# Patient Record
Sex: Male | Born: 1935 | Race: White | Hispanic: No | Marital: Married | Smoking: Never smoker
Health system: Southern US, Community
[De-identification: ages and names within clinical notes are randomized; demographics above are authoritative.]

## PROBLEM LIST (undated history)

## (undated) DIAGNOSIS — I5032 Chronic diastolic (congestive) heart failure: Secondary | ICD-10-CM

## (undated) DIAGNOSIS — E785 Hyperlipidemia, unspecified: Secondary | ICD-10-CM

## (undated) DIAGNOSIS — I050 Rheumatic mitral stenosis: Secondary | ICD-10-CM

## (undated) DIAGNOSIS — I779 Disorder of arteries and arterioles, unspecified: Secondary | ICD-10-CM

## (undated) DIAGNOSIS — L97909 Non-pressure chronic ulcer of unspecified part of unspecified lower leg with unspecified severity: Secondary | ICD-10-CM

## (undated) DIAGNOSIS — J449 Chronic obstructive pulmonary disease, unspecified: Secondary | ICD-10-CM

## (undated) DIAGNOSIS — I878 Other specified disorders of veins: Secondary | ICD-10-CM

## (undated) DIAGNOSIS — G459 Transient cerebral ischemic attack, unspecified: Secondary | ICD-10-CM

## (undated) DIAGNOSIS — S7291XA Unspecified fracture of right femur, initial encounter for closed fracture: Secondary | ICD-10-CM

## (undated) DIAGNOSIS — I482 Chronic atrial fibrillation, unspecified: Secondary | ICD-10-CM

## (undated) DIAGNOSIS — I340 Nonrheumatic mitral (valve) insufficiency: Secondary | ICD-10-CM

## (undated) DIAGNOSIS — I83009 Varicose veins of unspecified lower extremity with ulcer of unspecified site: Secondary | ICD-10-CM

## (undated) DIAGNOSIS — I272 Pulmonary hypertension, unspecified: Secondary | ICD-10-CM

## (undated) DIAGNOSIS — I428 Other cardiomyopathies: Secondary | ICD-10-CM

## (undated) DIAGNOSIS — J9610 Chronic respiratory failure, unspecified whether with hypoxia or hypercapnia: Secondary | ICD-10-CM

## (undated) DIAGNOSIS — I10 Essential (primary) hypertension: Secondary | ICD-10-CM

## (undated) DIAGNOSIS — C61 Malignant neoplasm of prostate: Secondary | ICD-10-CM

## (undated) DIAGNOSIS — I639 Cerebral infarction, unspecified: Secondary | ICD-10-CM

## (undated) DIAGNOSIS — D649 Anemia, unspecified: Secondary | ICD-10-CM

## (undated) DIAGNOSIS — I4891 Unspecified atrial fibrillation: Secondary | ICD-10-CM

## (undated) HISTORY — DX: Cerebral infarction, unspecified: I63.9

## (undated) HISTORY — DX: Rheumatic mitral stenosis: I05.0

## (undated) HISTORY — DX: Essential (primary) hypertension: I10

## (undated) HISTORY — DX: Malignant neoplasm of prostate: C61

## (undated) HISTORY — PX: TONSILLECTOMY: SUR1361

## (undated) HISTORY — DX: Transient cerebral ischemic attack, unspecified: G45.9

## (undated) HISTORY — DX: Hyperlipidemia, unspecified: E78.5

## (undated) HISTORY — PX: APPENDECTOMY: SHX54

## (undated) HISTORY — DX: Unspecified atrial fibrillation: I48.91

---

## 2001-08-20 ENCOUNTER — Ambulatory Visit (HOSPITAL_COMMUNITY): Admission: RE | Admit: 2001-08-20 | Discharge: 2001-08-20 | Payer: Self-pay | Admitting: Orthopedic Surgery

## 2007-11-26 ENCOUNTER — Ambulatory Visit: Payer: Self-pay | Admitting: Cardiovascular Disease

## 2007-11-26 ENCOUNTER — Inpatient Hospital Stay (HOSPITAL_COMMUNITY): Admission: EM | Admit: 2007-11-26 | Discharge: 2007-12-01 | Payer: Self-pay | Admitting: Emergency Medicine

## 2007-11-27 ENCOUNTER — Encounter (INDEPENDENT_AMBULATORY_CARE_PROVIDER_SITE_OTHER): Payer: Self-pay | Admitting: Internal Medicine

## 2007-11-27 ENCOUNTER — Ambulatory Visit: Payer: Self-pay | Admitting: Vascular Surgery

## 2007-11-27 ENCOUNTER — Encounter (INDEPENDENT_AMBULATORY_CARE_PROVIDER_SITE_OTHER): Payer: Self-pay | Admitting: *Deleted

## 2007-12-05 ENCOUNTER — Ambulatory Visit: Payer: Self-pay | Admitting: Cardiovascular Disease

## 2007-12-12 ENCOUNTER — Ambulatory Visit: Payer: Self-pay | Admitting: Cardiology

## 2007-12-19 ENCOUNTER — Ambulatory Visit: Payer: Self-pay | Admitting: Internal Medicine

## 2007-12-25 ENCOUNTER — Ambulatory Visit: Payer: Self-pay | Admitting: Cardiovascular Disease

## 2007-12-30 ENCOUNTER — Ambulatory Visit: Payer: Self-pay | Admitting: Cardiology

## 2008-01-13 ENCOUNTER — Ambulatory Visit: Payer: Self-pay | Admitting: Internal Medicine

## 2008-02-03 ENCOUNTER — Ambulatory Visit: Payer: Self-pay | Admitting: Cardiology

## 2008-03-02 ENCOUNTER — Ambulatory Visit: Payer: Self-pay | Admitting: Cardiology

## 2008-03-30 ENCOUNTER — Ambulatory Visit: Payer: Self-pay | Admitting: Cardiology

## 2008-04-27 ENCOUNTER — Ambulatory Visit: Payer: Self-pay | Admitting: Cardiovascular Disease

## 2008-05-11 ENCOUNTER — Ambulatory Visit: Payer: Self-pay | Admitting: Internal Medicine

## 2008-06-01 ENCOUNTER — Ambulatory Visit: Payer: Self-pay | Admitting: Cardiovascular Disease

## 2008-06-22 ENCOUNTER — Ambulatory Visit: Payer: Self-pay | Admitting: Internal Medicine

## 2008-07-20 ENCOUNTER — Ambulatory Visit: Payer: Self-pay | Admitting: Cardiology

## 2008-08-16 ENCOUNTER — Ambulatory Visit: Payer: Self-pay | Admitting: Internal Medicine

## 2008-09-14 ENCOUNTER — Ambulatory Visit: Payer: Self-pay | Admitting: Internal Medicine

## 2008-10-05 ENCOUNTER — Ambulatory Visit: Payer: Self-pay | Admitting: Cardiovascular Disease

## 2008-10-08 DIAGNOSIS — E785 Hyperlipidemia, unspecified: Secondary | ICD-10-CM | POA: Insufficient documentation

## 2008-10-08 DIAGNOSIS — I6350 Cerebral infarction due to unspecified occlusion or stenosis of unspecified cerebral artery: Secondary | ICD-10-CM | POA: Insufficient documentation

## 2008-10-08 DIAGNOSIS — I4891 Unspecified atrial fibrillation: Secondary | ICD-10-CM

## 2008-10-08 DIAGNOSIS — G459 Transient cerebral ischemic attack, unspecified: Secondary | ICD-10-CM

## 2008-10-08 DIAGNOSIS — I10 Essential (primary) hypertension: Secondary | ICD-10-CM | POA: Insufficient documentation

## 2008-10-12 ENCOUNTER — Encounter: Payer: Self-pay | Admitting: Cardiovascular Disease

## 2008-10-12 ENCOUNTER — Ambulatory Visit: Payer: Self-pay | Admitting: Cardiovascular Disease

## 2008-10-12 DIAGNOSIS — R609 Edema, unspecified: Secondary | ICD-10-CM

## 2008-11-02 ENCOUNTER — Ambulatory Visit: Payer: Self-pay | Admitting: Internal Medicine

## 2008-11-30 ENCOUNTER — Ambulatory Visit: Payer: Self-pay | Admitting: Cardiology

## 2008-11-30 LAB — CONVERTED CEMR LAB
POC INR: 3.1
Protime: 21.3

## 2008-12-01 ENCOUNTER — Encounter: Payer: Self-pay | Admitting: *Deleted

## 2008-12-28 ENCOUNTER — Ambulatory Visit: Payer: Self-pay | Admitting: Internal Medicine

## 2008-12-28 LAB — CONVERTED CEMR LAB: Prothrombin Time: 19.6 s

## 2009-01-05 ENCOUNTER — Encounter: Payer: Self-pay | Admitting: *Deleted

## 2009-01-25 ENCOUNTER — Ambulatory Visit: Payer: Self-pay | Admitting: Internal Medicine

## 2009-02-15 ENCOUNTER — Ambulatory Visit: Payer: Self-pay | Admitting: Cardiology

## 2009-03-15 ENCOUNTER — Ambulatory Visit: Payer: Self-pay | Admitting: Cardiology

## 2009-03-15 LAB — CONVERTED CEMR LAB: POC INR: 3.3

## 2009-04-06 ENCOUNTER — Ambulatory Visit: Payer: Self-pay | Admitting: Cardiovascular Disease

## 2009-04-06 ENCOUNTER — Ambulatory Visit: Payer: Self-pay | Admitting: Internal Medicine

## 2009-04-26 ENCOUNTER — Ambulatory Visit: Payer: Self-pay | Admitting: Cardiology

## 2009-05-24 ENCOUNTER — Ambulatory Visit: Payer: Self-pay | Admitting: Cardiology

## 2009-05-24 LAB — CONVERTED CEMR LAB: POC INR: 2.4

## 2009-06-03 ENCOUNTER — Telehealth: Payer: Self-pay | Admitting: Cardiovascular Disease

## 2009-06-21 ENCOUNTER — Ambulatory Visit: Payer: Self-pay | Admitting: Internal Medicine

## 2009-06-21 LAB — CONVERTED CEMR LAB
INR: 2.7
POC INR: 2.7

## 2009-07-19 ENCOUNTER — Ambulatory Visit: Payer: Self-pay | Admitting: Cardiology

## 2009-08-16 ENCOUNTER — Ambulatory Visit: Payer: Self-pay | Admitting: Internal Medicine

## 2009-08-16 LAB — CONVERTED CEMR LAB: POC INR: 2.4

## 2009-09-13 ENCOUNTER — Ambulatory Visit: Payer: Self-pay | Admitting: Cardiology

## 2009-10-05 ENCOUNTER — Ambulatory Visit: Payer: Self-pay | Admitting: Cardiovascular Disease

## 2009-10-25 ENCOUNTER — Encounter (INDEPENDENT_AMBULATORY_CARE_PROVIDER_SITE_OTHER): Payer: Self-pay | Admitting: Pharmacist

## 2009-11-01 ENCOUNTER — Ambulatory Visit: Payer: Self-pay | Admitting: Cardiology

## 2009-11-29 ENCOUNTER — Ambulatory Visit: Payer: Self-pay | Admitting: Internal Medicine

## 2009-12-27 ENCOUNTER — Ambulatory Visit: Payer: Self-pay | Admitting: Internal Medicine

## 2009-12-27 LAB — CONVERTED CEMR LAB: POC INR: 2.8

## 2010-01-24 ENCOUNTER — Ambulatory Visit: Payer: Self-pay | Admitting: Cardiology

## 2010-01-24 LAB — CONVERTED CEMR LAB: POC INR: 2.9

## 2010-02-21 ENCOUNTER — Ambulatory Visit: Payer: Self-pay | Admitting: Cardiovascular Disease

## 2010-03-21 ENCOUNTER — Ambulatory Visit: Payer: Self-pay | Admitting: Cardiology

## 2010-03-21 LAB — CONVERTED CEMR LAB: POC INR: 3

## 2010-04-13 ENCOUNTER — Encounter: Payer: Self-pay | Admitting: Cardiovascular Disease

## 2010-04-18 ENCOUNTER — Ambulatory Visit: Payer: Self-pay | Admitting: Cardiology

## 2010-04-18 ENCOUNTER — Ambulatory Visit: Payer: Self-pay | Admitting: Cardiovascular Disease

## 2010-04-18 DIAGNOSIS — I509 Heart failure, unspecified: Secondary | ICD-10-CM

## 2010-04-18 LAB — CONVERTED CEMR LAB: POC INR: 2.1

## 2010-05-16 ENCOUNTER — Ambulatory Visit: Payer: Self-pay | Admitting: Cardiovascular Disease

## 2010-06-13 ENCOUNTER — Ambulatory Visit: Payer: Self-pay | Admitting: Internal Medicine

## 2010-07-11 ENCOUNTER — Ambulatory Visit: Admission: RE | Admit: 2010-07-11 | Discharge: 2010-07-11 | Payer: Self-pay | Source: Home / Self Care

## 2010-08-03 NOTE — Medication Information (Signed)
Summary: rov/eac  Anticoagulant Therapy  Managed by: Bethena Midget, RN, BSN Referring MD: Charlton Haws MD Supervising MD: Gala Romney MD, Reuel Boom Indication 1: Atrial Fibrillation (ICD-427.31) Lab Used: [facility] Site: [facility] INR POC 2.8 INR RANGE 2 - 3  Dietary changes: no    Health status changes: no    Bleeding/hemorrhagic complications: no    Recent/future hospitalizations: no    Any changes in medication regimen? no    Recent/future dental: no  Any missed doses?: no       Is patient compliant with meds? yes       Allergies: 1)  Penicillin V Potassium (Penicillin V Potassium)  Anticoagulation Management History:      The patient is taking warfarin and comes in today for a routine follow up visit.  Positive risk factors for bleeding include an age of 37 years or older and history of CVA/TIA.  The bleeding index is 'intermediate risk'.  Positive CHADS2 values include History of HTN, Age > 6 years old, and Prior Stroke/CVA/TIA.  The start date was 11/28/2007.  His last INR was 2.7.  Anticoagulation responsible provider: Bensimhon MD, Reuel Boom.  INR POC: 2.8.  Cuvette Lot#: [id number].  Exp: 03/2011.    Anticoagulation Management Assessment/Plan:      The patient's current anticoagulation dose is Coumadin 5 mg tabs: Take as directed by coumadin clinic..  The target INR is 2.0-3.0.  The next INR is due 01/24/2010.  Anticoagulation instructions were given to patient.  Results were reviewed/authorized by Bethena Midget, RN, BSN.  He was notified by Bethena Midget, RN, BSN.         Prior Anticoagulation Instructions: INR 2.6  Continue taking 1 tablet every day.  Return to clinic in 4 weeks.      Current Anticoagulation Instructions: INR 2.8 Continue 5mg s everyday.Recheck in 4 weeks.

## 2010-08-03 NOTE — Medication Information (Signed)
Summary: rov/eh  Anticoagulant Therapy  Managed by: Bethena Midget, RN, BSN Referring MD: Charlton Haws MD Supervising MD: Myrtis Ser MD, Tinnie Gens Indication 1: Atrial Fibrillation (ICD-427.31) Lab Used: [facility] Site: [facility] INR POC 2.4 INR RANGE 2 - 3  Dietary changes: no    Health status changes: no    Bleeding/hemorrhagic complications: no    Recent/future hospitalizations: no    Any changes in medication regimen? no    Recent/future dental: no  Any missed doses?: no       Is patient compliant with meds? yes       Allergies: 1)  Penicillin V Potassium (Penicillin V Potassium)  Anticoagulation Management History:      The patient is taking warfarin and comes in today for a routine follow up visit.  Positive risk factors for bleeding include an age of 75 years or older and history of CVA/TIA.  The bleeding index is 'intermediate risk'.  Positive CHADS2 values include History of HTN, Age > 28 years old, and Prior Stroke/CVA/TIA.  The start date was 11/28/2007.  His last INR was 2.7.  Anticoagulation responsible provider: Myrtis Ser MD, Tinnie Gens.  INR POC: 2.4.  Cuvette Lot#: [id number].  Exp: 10/2010.    Anticoagulation Management Assessment/Plan:      The patient's current anticoagulation dose is Coumadin 5 mg tabs: Take as directed by coumadin clinic..  The target INR is 2.0-3.0.  The next INR is due 09/13/2009.  Anticoagulation instructions were given to patient.  Results were reviewed/authorized by Bethena Midget, RN, BSN.  He was notified by Bethena Midget, RN, BSN.         Prior Anticoagulation Instructions: INR 2.7  Continue same dose of 1 tablet daily. Recheck in 4 weeks.  Current Anticoagulation Instructions: INR 2.4 Continue 5mg s daily. Recheck in 4 weeks.

## 2010-08-03 NOTE — Medication Information (Signed)
Summary: rovtm  Anticoagulant Therapy  Managed by: Bethena Midget, RN, BSN Referring MD: Charlton Haws MD Supervising MD: Shirlee Latch MD, Orie Baxendale Indication 1: Atrial Fibrillation (ICD-427.31) Lab Used: [facility] Site: [facility] INR POC 2.4 INR RANGE 2 - 3  Dietary changes: no    Health status changes: no    Bleeding/hemorrhagic complications: no    Recent/future hospitalizations: no    Any changes in medication regimen? no    Recent/future dental: no  Any missed doses?: no       Is patient compliant with meds? yes       Allergies: 1)  Penicillin V Potassium (Penicillin V Potassium)  Anticoagulation Management History:      The patient is taking warfarin and comes in today for a routine follow up visit.  Positive risk factors for bleeding include an age of 75 years or older and history of CVA/TIA.  The bleeding index is 'intermediate risk'.  Positive CHADS2 values include History of HTN, Age > 30 years old, and Prior Stroke/CVA/TIA.  The start date was 11/28/2007.  His last INR was 2.7.  Anticoagulation responsible provider: Shirlee Latch MD, Abubakr Wieman.  INR POC: 2.4.  Cuvette Lot#: [id number].  Exp: 10/2010.    Anticoagulation Management Assessment/Plan:      The patient's current anticoagulation dose is Coumadin 5 mg tabs: Take as directed by coumadin clinic..  The target INR is 2.0-3.0.  The next INR is due 10/11/2009.  Anticoagulation instructions were given to patient.  Results were reviewed/authorized by Bethena Midget, RN, BSN.  He was notified by Bethena Midget, RN, BSN.         Prior Anticoagulation Instructions: INR 2.4 Continue 5mg s daily. Recheck in 4 weeks.   Current Anticoagulation Instructions: Same as Prior Instructions.

## 2010-08-03 NOTE — Medication Information (Signed)
Summary: rov/mwb  Anticoagulant Therapy  Managed by: Weston Brass, PharmD Referring MD: Charlton Haws MD Supervising MD: Gala Romney MD, Reuel Boom Indication 1: Atrial Fibrillation (ICD-427.31) Lab Used: [facility] Site: [facility] INR POC 2.9 INR RANGE 2 - 3  Dietary changes: no    Health status changes: no    Bleeding/hemorrhagic complications: no    Recent/future hospitalizations: no    Any changes in medication regimen? no    Recent/future dental: no  Any missed doses?: yes     Details: Missed 1 dose 12/25  Is patient compliant with meds? yes       Allergies: 1)  Penicillin V Potassium (Penicillin V Potassium)  Anticoagulation Management History:      The patient is taking warfarin and comes in today for a routine follow up visit.  Positive risk factors for bleeding include an age of 75 years or older and history of CVA/TIA.  The bleeding index is 'intermediate risk'.  Positive CHADS2 values include History of CHF, History of HTN, Age > 75 years old, and Prior Stroke/CVA/TIA.  The start date was 11/28/2007.  His last INR was 2.7.  Anticoagulation responsible provider: Landy Dunnavant MD, Reuel Boom.  INR POC: 2.9.  Cuvette Lot#: [id number].  Exp: 08/2011.    Anticoagulation Management Assessment/Plan:      The patient's current anticoagulation dose is Coumadin 5 mg tabs: Take as directed by coumadin clinic..  The target INR is 2.0-3.0.  The next INR is due 08/08/2010.  Anticoagulation instructions were given to patient.  Results were reviewed/authorized by Weston Brass, PharmD.  He was notified by Stephannie Peters, PharmD Candidate .         Prior Anticoagulation Instructions: INR 2.3 The patient is to continue with the same dose of coumadin.  This dosage includes:  1 tablet (5mg ) every day Recheck INR in 4 weeks  Current Anticoagulation Instructions: INR 2.9  Coumadin 5 mg tablets - Take 1 tablet every day

## 2010-08-03 NOTE — Medication Information (Signed)
Summary: rov/ewj  Anticoagulant Therapy  Managed by: Bethena Midget, RN, BSN Referring MD: Charlton Haws MD Supervising MD: Clifton James MD, Cristal Deer Indication 1: Atrial Fibrillation (ICD-427.31) Lab Used: [facility] Site: [facility] INR POC 2.0 INR RANGE 2 - 3  Dietary changes: no    Health status changes: no    Bleeding/hemorrhagic complications: no    Recent/future hospitalizations: no    Any changes in medication regimen? no    Recent/future dental: no  Any missed doses?: yes     Details: missed one dose a week ago  Is patient compliant with meds? yes       Allergies: 1)  Penicillin V Potassium (Penicillin V Potassium)  Anticoagulation Management History:      The patient is taking warfarin and comes in today for a routine follow up visit.  Positive risk factors for bleeding include an age of 75 years or older and history of CVA/TIA.  The bleeding index is 'intermediate risk'.  Positive CHADS2 values include History of HTN, Age > 13 years old, and Prior Stroke/CVA/TIA.  The start date was 11/28/2007.  His last INR was 2.7.  Anticoagulation responsible Kira Hartl: Clifton James MD, Cristal Deer.  INR POC: 2.0.  Cuvette Lot#: [id number].  Exp: 04/2011.    Anticoagulation Management Assessment/Plan:      The patient's current anticoagulation dose is Coumadin 5 mg tabs: Take as directed by coumadin clinic..  The target INR is 2.0-3.0.  The next INR is due 03/21/2010.  Anticoagulation instructions were given to patient.  Results were reviewed/authorized by Bethena Midget, RN, BSN.  He was notified by Bethena Midget, RN, BSN.         Prior Anticoagulation Instructions: INR 2.9  Continue on same dosage 5mg  daily.  Recheck in 4 weeks.    Current Anticoagulation Instructions: INR 2.0 Continue 5mg s everyday. Recheck in 4 weeks.

## 2010-08-03 NOTE — Medication Information (Signed)
Summary: rov/tm  Anticoagulant Therapy  Managed by: Cloyde Reams, RN, BSN Referring MD: Charlton Haws MD Supervising MD: Daleen Squibb MD, Maisie Fus Indication 1: Atrial Fibrillation (ICD-427.31) Lab Used: [facility] Site: [facility] INR POC 3.0 INR RANGE 2 - 3  Dietary changes: no    Health status changes: no    Bleeding/hemorrhagic complications: no    Recent/future hospitalizations: no    Any changes in medication regimen? no    Recent/future dental: no  Any missed doses?: no       Is patient compliant with meds? yes       Allergies: 1)  Penicillin V Potassium (Penicillin V Potassium)  Anticoagulation Management History:      The patient is taking warfarin and comes in today for a routine follow up visit.  Positive risk factors for bleeding include an age of 75 years or older and history of CVA/TIA.  The bleeding index is 'intermediate risk'.  Positive CHADS2 values include History of HTN, Age > 16 years old, and Prior Stroke/CVA/TIA.  The start date was 11/28/2007.  His last INR was 2.7.  Anticoagulation responsible Raymund Manrique: Daleen Squibb MD, Maisie Fus.  INR POC: 3.0.  Cuvette Lot#: [id number].  Exp: 05/2011.    Anticoagulation Management Assessment/Plan:      The patient's current anticoagulation dose is Coumadin 5 mg tabs: Take as directed by coumadin clinic..  The target INR is 2.0-3.0.  The next INR is due 04/18/2010.  Anticoagulation instructions were given to patient.  Results were reviewed/authorized by Cloyde Reams, RN, BSN.  He was notified by Cloyde Reams RN.         Prior Anticoagulation Instructions: INR 2.0 Continue 5mg s everyday. Recheck in 4 weeks.   Current Anticoagulation Instructions: INR 3.0  Continue on same dosage 5mg  daily.  Recheck in 4 weeks.

## 2010-08-03 NOTE — Medication Information (Signed)
Summary: rov.mp  Anticoagulant Therapy  Managed by: Lew Dawes, PharmD Candidate Referring MD: Charlton Haws MD Supervising MD: Myrtis Ser MD, Tinnie Gens Indication 1: Atrial Fibrillation (ICD-427.31) Lab Used: [facility] Site: [facility] INR POC 2.7 INR RANGE 2 - 3  Dietary changes: no    Health status changes: no    Bleeding/hemorrhagic complications: no    Recent/future hospitalizations: no    Any changes in medication regimen? no    Recent/future dental: no  Any missed doses?: no       Is patient compliant with meds? yes       Allergies: 1)  Penicillin V Potassium (Penicillin V Potassium)  Anticoagulation Management History:      The patient is taking warfarin and comes in today for a routine follow up visit.  Positive risk factors for bleeding include an age of 75 years or older and history of CVA/TIA.  The bleeding index is 'intermediate risk'.  Positive CHADS2 values include History of HTN, Age > 75 years old, and Prior Stroke/CVA/TIA.  The start date was 11/28/2007.  His last INR was 2.7.  Anticoagulation responsible provider: Myrtis Ser MD, Tinnie Gens.  INR POC: 2.7.  Cuvette Lot#: [id number].  Exp: 10/2010.    Anticoagulation Management Assessment/Plan:      The patient's current anticoagulation dose is Coumadin 5 mg tabs: Take as directed by coumadin clinic..  The target INR is 2.0-3.0.  The next INR is due 08/16/2009.  Anticoagulation instructions were given to patient.  Results were reviewed/authorized by Lew Dawes, PharmD Candidate.  He was notified by Lew Dawes, PharmD Candidate.         Prior Anticoagulation Instructions: INR = 2.7  The patient is to continue with the same dose of coumadin.  This dosage includes: one tablet daily  Current Anticoagulation Instructions: INR 2.7  Continue same dose of 1 tablet daily. Recheck in 4 weeks.

## 2010-08-03 NOTE — Letter (Signed)
Summary: [facility] - [facility]inquent Coumadin 1  Coumadin  [address]   [place], [place] [address]   Phone: [phone]  Fax: [phone]     October 25, 2009 MRN: [id number]   [facility] [address] [place], [place]  [address]   Dear Aaron Frye,  This letter is being sent to you as a reminder that it is necessary for you to get your INR/PT checked regularly so that we can optimize your care.  Our records indicate that you were scheduled to have a test done recently.  As of today, we have not received the results of this test.  It is very important that you have your INR checked.  Please call our office at the number listed above to schedule an appointment at your earliest convenience.    If you have recently had your protime checked or have discontinued this medication, please contact our office at the above phone number to clarify this issue.  Thank you for this prompt attention to this important health care matter.  Sincerely,   [facility] Team

## 2010-08-03 NOTE — Medication Information (Signed)
Summary: rov/ewj  Anticoagulant Therapy  Managed by: Bethena Midget, RN, BSN Referring MD: Charlton Haws MD Supervising MD: Myrtis Ser MD, Tinnie Gens Indication 1: Atrial Fibrillation (ICD-427.31) Lab Used: [facility] Site: [facility] INR POC 2.1 INR RANGE 2 - 3  Dietary changes: no    Health status changes: no    Bleeding/hemorrhagic complications: no    Recent/future hospitalizations: no    Any changes in medication regimen? no    Recent/future dental: no  Any missed doses?: no       Is patient compliant with meds? yes      Comments: Seeing Dr Myrtis Ser today   Allergies: 1)  Penicillin V Potassium (Penicillin V Potassium)  Anticoagulation Management History:      The patient is taking warfarin and comes in today for a routine follow up visit.  Positive risk factors for bleeding include an age of 16 years or older and history of CVA/TIA.  The bleeding index is 'intermediate risk'.  Positive CHADS2 values include History of HTN, Age > 1 years old, and Prior Stroke/CVA/TIA.  The start date was 11/28/2007.  His last INR was 2.7.  Anticoagulation responsible provider: Myrtis Ser MD, Tinnie Gens.  INR POC: 2.1.  Cuvette Lot#: [id number].  Exp: 05/2011.    Anticoagulation Management Assessment/Plan:      The patient's current anticoagulation dose is Coumadin 5 mg tabs: Take as directed by coumadin clinic..  The target INR is 2.0-3.0.  The next INR is due 05/16/2010.  Anticoagulation instructions were given to patient.  Results were reviewed/authorized by Bethena Midget, RN, BSN.  He was notified by Bethena Midget, RN, BSN.         Prior Anticoagulation Instructions: INR 3.0  Continue on same dosage 5mg  daily.  Recheck in 4 weeks.    Current Anticoagulation Instructions: INR 2.1 Continue 5mg s daily. Recheck in 4 weeks.

## 2010-08-03 NOTE — Medication Information (Signed)
Summary: rov/tm  Anticoagulant Therapy  Managed by: Cloyde Reams, RN, BSN Referring MD: Charlton Haws MD Supervising MD: Juanda Chance MD, Riyan Haile Indication 1: Atrial Fibrillation (ICD-427.31) Lab Used: [facility] Site: [facility] INR POC 2.9 INR RANGE 2 - 3  Dietary changes: no    Health status changes: no    Bleeding/hemorrhagic complications: no    Recent/future hospitalizations: no    Any changes in medication regimen? no    Recent/future dental: no  Any missed doses?: no       Is patient compliant with meds? yes       Allergies: 1)  Penicillin V Potassium (Penicillin V Potassium)  Anticoagulation Management History:      The patient is taking warfarin and comes in today for a routine follow up visit.  Positive risk factors for bleeding include an age of 75 years or older and history of CVA/TIA.  The bleeding index is 'intermediate risk'.  Positive CHADS2 values include History of HTN, Age > 75 years old, and Prior Stroke/CVA/TIA.  The start date was 11/28/2007.  His last INR was 2.7.  Anticoagulation responsible provider: Juanda Chance MD, Smitty Cords.  INR POC: 2.9.  Cuvette Lot#: [id number].  Exp: 04/2011.    Anticoagulation Management Assessment/Plan:      The patient's current anticoagulation dose is Coumadin 5 mg tabs: Take as directed by coumadin clinic..  The target INR is 2.0-3.0.  The next INR is due 02/21/2010.  Anticoagulation instructions were given to patient.  Results were reviewed/authorized by Cloyde Reams, RN, BSN.  He was notified by Cloyde Reams RN.         Prior Anticoagulation Instructions: INR 2.8 Continue 5mg s everyday.Recheck in 4 weeks.   Current Anticoagulation Instructions: INR 2.9  Continue on same dosage 5mg  daily.  Recheck in 4 weeks.

## 2010-08-03 NOTE — Assessment & Plan Note (Signed)
Summary: 6 MO F/U /CY   History of Present Illness: Aaron Frye is seen today for f/U of HTN, chronic afib and coumadin therapy.  He is doing well and continues to work daily at the [employer].  His coumadin dose has been decreased a bit.  He has not had any bleeding porblems and sees the clinic monthly. His rate contol has been fine with no palpitations, SSCP or dyspnea.  His EF has been in the 35-40% range.  He has had no congestive symptoms.  His last echo suggested MS but I think it is more functional MAC  He has some large varicosities in the RLE but is not interested in injection Rx.  Current Problems (verified): 1)  Coumadin Therapy  (ICD-V58.61) 2)  Edema  (ICD-782.3) 3)  Tia  (ICD-435.9) 4)  Hyperlipidemia  (ICD-272.4) 5)  Hypertension  (ICD-401.9) 6)  Cva  (ICD-434.91) 7)  Atrial Fibrillation  (ICD-427.31)  Current Medications (verified): 1)  Altace 5 Mg Tabs (Ramipril) .... Take 1 Tablet By Mouth Once A Day 2)  Crestor 20 Mg Tabs (Rosuvastatin Calcium) .... Take 1 Tablet By Mouth Once A Day 3)  Hydrochlorothiazide 12.5 Mg Tabs (Hydrochlorothiazide) .... Take 1 Tablet By Mouth Every Morning 4)  Toprol Xl 100 Mg Xr24h-Tab (Metoprolol Succinate) .... Take 1/2 Tablet By Mouth Once A Day 5)  Coumadin 5 Mg Tabs (Warfarin Sodium) .... Take As Directed By Coumadin Clinic.  Allergies (verified): 1)  Penicillin V Potassium (Penicillin V Potassium)  Past History:  Past Medical History: Last updated: 10/08/2008 Current Problems:  TIA (ICD-435.9) HYPERLIPIDEMIA (ICD-272.4) HYPERTENSION (ICD-401.9) CVA (ICD-434.91) ATRIAL FIBRILLATION (ICD-427.31)  acute left frontoparietal infarct. prostate cancer   Family History: Last updated: 10/08/2008  Noncontributory.   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  Alcohol Use - no Drug Use - no  Review of Systems       Denies fever, malais, weight loss, blurry vision, decreased visual  acuity, cough, sputum, SOB, hemoptysis, pleuritic pain, palpitaitons, heartburn, abdominal pain, melena, lower extremity edema, claudication, or rash.   Vital Signs:  Patient profile:   77 year old male Height:      69 inches Weight:      191 pounds BMI:     28.31 Pulse rate:   70 / minute Pulse rhythm:   irregularly irregular Resp:     12 per minute BP sitting:   140 / 78  (left arm)  Vitals Entered By: Kem Parkinson (October 05, 2009 9:18 AM)  Physical Exam  General:  Affect appropriate Healthy:  appears stated age HEENT: normal Neck supple with no adenopathy JVP normal no bruits no thyromegaly Lungs clear with no wheezing and good diaphragmatic motion Heart:  S1/S2 no murmur,rub, gallop or click PMI normal Abdomen: benighn, BS positve, no tenderness, no AAA no bruit.  No HSM or HJR Distal pulses intact with no bruits Plus one edema with large varicosities in RLE Neuro non-focal Skin warm and dry    Impression & Recommendations:  Problem # 1:  COUMADIN THERAPY (ICD-V58.61) Dose adjusted.  INR RX with no bleeding problems  Problem # 2:  EDEMA (ICD-782.3) Support hose and elevate legs at end of day.  [facility]er to [facility] if needed  Problem # 3:  HYPERTENSION (ICD-401.9) Well controlled  His updated medication list for this problem includes:    Altace 5 Mg Tabs (Ramipril) .[name]... Take 1 tablet by mouth once a day  Hydrochlorothiazide 12.5 Mg Tabs (Hydrochlorothiazide) .[name]... Take 1 tablet by mouth every morning    Toprol Xl 100 Mg Xr24h-tab (Metoprolol succinate) .[name]... Take 1/2 tablet by mouth once a day  Problem # 4:  HYPERLIPIDEMIA (ICD-272.4) Liver and lipid in 6 months His updated medication list for this problem includes:    Crestor 20 Mg Tabs (Rosuvastatin calcium) .[name]... Take 1 tablet by mouth once a day  Problem # 5:  ATRIAL FIBRILLATION (ICD-427.31) Asymptomatic with good rate control His updated medication list for this problem includes:     Toprol Xl 100 Mg Xr24h-tab (Metoprolol succinate) .[name]... Take 1/2 tablet by mouth once a day    Coumadin 5 Mg Tabs (Warfarin sodium) .[name]... Take as directed by coumadin clinic.  Patient Instructions: 1)  Your physician recommends that you schedule a follow-up appointment in: 6 MONTHS

## 2010-08-03 NOTE — Medication Information (Signed)
Summary: rov/tm  Anticoagulant Therapy  Managed by: Lyna Poser, PharmD Referring MD: Charlton Haws MD Supervising MD: Excell Seltzer MD, Casimiro Needle Indication 1: Atrial Fibrillation (ICD-427.31) Lab Used: [facility] Site: [facility] INR POC 2 INR RANGE 2 - 3  Dietary changes: no    Health status changes: no    Bleeding/hemorrhagic complications: no    Recent/future hospitalizations: no    Any changes in medication regimen? no    Recent/future dental: no  Any missed doses?: no       Is patient compliant with meds? yes       Allergies: 1)  Penicillin V Potassium (Penicillin V Potassium)  Anticoagulation Management History:      The patient is taking warfarin and comes in today for a routine follow up visit.  Positive risk factors for bleeding include an age of 75 years or older and history of CVA/TIA.  The bleeding index is 'intermediate risk'.  Positive CHADS2 values include History of CHF, History of HTN, Age > 64 years old, and Prior Stroke/CVA/TIA.  The start date was 11/28/2007.  His last INR was 2.7.  Anticoagulation responsible provider: Excell Seltzer MD, Casimiro Needle.  INR POC: 2.  Cuvette Lot#: [id number].  Exp: 06/02/2011.    Anticoagulation Management Assessment/Plan:      The patient's current anticoagulation dose is Coumadin 5 mg tabs: Take as directed by coumadin clinic..  The target INR is 2.0-3.0.  The next INR is due 06/13/2010.  Anticoagulation instructions were given to patient.  Results were reviewed/authorized by Lyna Poser, PharmD.         Prior Anticoagulation Instructions: INR 2.1 Continue 5mg s daily. Recheck in 4 weeks.    Current Anticoagulation Instructions: INR 2 Continue taking 1 tablet everyday. Recheck in 4 weeks.

## 2010-08-03 NOTE — Assessment & Plan Note (Signed)
Summary: F6M/DM   History of Present Illness: Aaron Frye is seen today for f/U of HTN, chronic afib and coumadin therapy.  He is doing well and continues to work daily at the [employer].  His coumadin dose has been decreased a bit.  He has not had any bleeding porblems and sees the clinic monthly. His rate contol has been fine with no palpitations, SSCP or dyspnea.  His EF has been in the 35-40% range.  He has had no congestive symptoms.  His last echo suggested MS but I think it is more functional MAC  He has some large varicosities in the RLE but is not interested in injection Rx. He will have a F/U echo in 6 months.    Current Problems (verified): 1)  CHF  (ICD-428.0) 2)  Coumadin Therapy  (ICD-V58.61) 3)  Edema  (ICD-782.3) 4)  Tia  (ICD-435.9) 5)  Hyperlipidemia  (ICD-272.4) 6)  Hypertension  (ICD-401.9) 7)  Cva  (ICD-434.91) 8)  Atrial Fibrillation  (ICD-427.31)  Current Medications (verified): 1)  Altace 5 Mg Tabs (Ramipril) .... Take 1 Tablet By Mouth Once A Day 2)  Crestor 20 Mg Tabs (Rosuvastatin Calcium) .... Take 1 Tablet By Mouth Once A Day 3)  Hydrochlorothiazide 12.5 Mg Tabs (Hydrochlorothiazide) .... Take 1 Tablet By Mouth Every Morning 4)  Toprol Xl 100 Mg Xr24h-Tab (Metoprolol Succinate) .... Take 1/2 Tablet By Mouth Once A Day 5)  Coumadin 5 Mg Tabs (Warfarin Sodium) .... Take As Directed By Coumadin Clinic.  Allergies (verified): 1)  Penicillin V Potassium (Penicillin V Potassium)  Past History:  Past Medical History: Last updated: 10/08/2008 Current Problems:  TIA (ICD-435.9) HYPERLIPIDEMIA (ICD-272.4) HYPERTENSION (ICD-401.9) CVA (ICD-434.91) ATRIAL FIBRILLATION (ICD-427.31)  acute left frontoparietal infarct. prostate cancer   Family History: Last updated: 10/08/2008  Noncontributory.   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  Alcohol Use - no Drug Use - no  Review of Systems       Denies fever,  malais, weight loss, blurry vision, decreased visual acuity, cough, sputum, SOB, hemoptysis, pleuritic pain, palpitaitons, heartburn, abdominal pain, melena,  claudication, or rash. Chronic venous insuf with brawny edema  Vital Signs:  Patient profile:   77 year old male Height:      69 inches Weight:      187 pounds BMI:     27.71 Pulse rate:   78 / minute Pulse rhythm:   irregularly irregular Resp:     12 per minute BP sitting:   138 / 80  (left arm)  Vitals Entered By: Kem Parkinson (April 18, 2010 9:55 AM)  Physical Exam  General:  Affect appropriate Healthy:  appears stated age HEENT: normal Neck supple with no adenopathy JVP normal no bruits no thyromegaly Lungs clear with no wheezing and good diaphragmatic motion Heart:  S1/S2 no murmur,rub, gallop or click PMI normal Abdomen: benighn, BS positve, no tenderness, no AAA no bruit.  No HSM or HJR Distal pulses intact with no bruits Chronic venous insuf with brawny edema Neuro non-focal Skin warm and dry    Impression & Recommendations:  Problem # 1:  CHF (ICD-428.0) Stable F/U echo in 6 months His updated medication list for this problem includes:    Altace 5 Mg Tabs (Ramipril) .[name]... Take 1 tablet by mouth once a day    Hydrochlorothiazide 12.5 Mg Tabs (Hydrochlorothiazide) .[name]... Take 1 tablet by mouth every morning    Toprol Xl 100 Mg Xr24h-tab (Metoprolol succinate) .Marland KitchenMarland KitchenMarland KitchenMarland Kitchen  Take 1/2 tablet by mouth once a day    Coumadin 5 Mg Tabs (Warfarin sodium) .[name]... Take as directed by coumadin clinic.  Orders: Echocardiogram (Echo)  Problem # 2:  COUMADIN THERAPY (ICD-V58.61) INR Rx with no bleeding diathesis  Problem # 3:  EDEMA (ICD-782.3) Varicosities iwth venous insuf.  Continue support hose.  He has not wanted to see a vein specialist in past.  Continue HCTZ  Problem # 4:  HYPERTENSION (ICD-401.9) Well controlled His updated medication list for this problem includes:    Altace 5 Mg Tabs (Ramipril) .[name]...  Take 1 tablet by mouth once a day    Hydrochlorothiazide 12.5 Mg Tabs (Hydrochlorothiazide) .[name]... Take 1 tablet by mouth every morning    Toprol Xl 100 Mg Xr24h-tab (Metoprolol succinate) .[name]... Take 1/2 tablet by mouth once a day  Problem # 5:  HYPERLIPIDEMIA (ICD-272.4) Continue statins.  LFTs per primary in 6 months His updated medication list for this problem includes:    Crestor 20 Mg Tabs (Rosuvastatin calcium) .[name]... Take 1 tablet by mouth once a day  Problem # 6:  ATRIAL FIBRILLATION (ICD-427.31) Asymptomatic with good rate control and anticoagulaiton His updated medication list for this problem includes:    Toprol Xl 100 Mg Xr24h-tab (Metoprolol succinate) .[name]... Take 1/2 tablet by mouth once a day    Coumadin 5 Mg Tabs (Warfarin sodium) .[name]... Take as directed by coumadin clinic.  Orders: EKG w/ Interpretation (93000)  Patient Instructions: 1)  Your physician recommends that you schedule a follow-up appointment in: 6 MONTHS WITH DR Laurence Slate 2)  Your physician recommends that you continue on your current medications as directed. Please refer to the Current Medication list given to you today. 3)  Your physician has requested that you have an echocardiogram.  Echocardiography is a painless test that uses sound waves to create images of your heart. It provides your doctor with information about the size and shape of your heart and how well your heart's chambers and valves are working.  This procedure takes approximately one hour. There are no restrictions for this procedure.6 MONTHS   EKG Report  Procedure date:  04/18/2010  Findings:      AFib 78 Otherwise normal

## 2010-08-03 NOTE — Medication Information (Signed)
Summary: rov/mw  Anticoagulant Therapy  Managed by: Lyna Poser, PharmD Referring MD: Charlton Haws MD Supervising MD: Shirlee Latch MD, Dalton Indication 1: Atrial Fibrillation (ICD-427.31) Lab Used: [facility] Site: [facility] INR RANGE 2 - 3  Dietary changes: no    Health status changes: no    Bleeding/hemorrhagic complications: no    Recent/future hospitalizations: no    Any changes in medication regimen? no    Recent/future dental: no  Any missed doses?: no       Is patient compliant with meds? yes       Allergies: 1)  Penicillin V Potassium (Penicillin V Potassium)  Anticoagulation Management History:      Positive risk factors for bleeding include an age of 75 years or older and history of CVA/TIA.  The bleeding index is 'intermediate risk'.  Positive CHADS2 values include History of CHF, History of HTN, Age > 47 years old, and Prior Stroke/CVA/TIA.  The start date was 11/28/2007.  His last INR was 2.7.  Anticoagulation responsible provider: Shirlee Latch MD, Dalton.  Cuvette Lot#: [id number].  Exp: 07/2011.    Anticoagulation Management Assessment/Plan:      The patient's current anticoagulation dose is Coumadin 5 mg tabs: Take as directed by coumadin clinic..  The target INR is 2.0-3.0.  The next INR is due 07/11/2010.  Anticoagulation instructions were given to patient.  Results were reviewed/authorized by Lyna Poser, PharmD.         Prior Anticoagulation Instructions: INR 2 Continue taking 1 tablet everyday. Recheck in 4 weeks.  Current Anticoagulation Instructions: INR 2.3 The patient is to continue with the same dose of coumadin.  This dosage includes:  1 tablet (5mg ) every day Recheck INR in 4 weeks

## 2010-08-03 NOTE — Medication Information (Signed)
Summary: rov/eac  Anticoagulant Therapy  Managed by: Eda Keys, PharmD Referring MD: Charlton Haws MD Supervising MD: Ladona Ridgel MD, Sharlot Gowda Indication 1: Atrial Fibrillation (ICD-427.31) Lab Used: [facility] Site: [facility] INR POC 2.6 INR RANGE 2 - 3  Dietary changes: no    Health status changes: no    Bleeding/hemorrhagic complications: no    Recent/future hospitalizations: no    Any changes in medication regimen? no    Recent/future dental: no  Any missed doses?: no       Is patient compliant with meds? yes       Allergies: 1)  Penicillin V Potassium (Penicillin V Potassium)  Anticoagulation Management History:      The patient is taking warfarin and comes in today for a routine follow up visit.  Positive risk factors for bleeding include an age of 75 years or older and history of CVA/TIA.  The bleeding index is 'intermediate risk'.  Positive CHADS2 values include History of HTN, Age > 37 years old, and Prior Stroke/CVA/TIA.  The start date was 11/28/2007.  His last INR was 2.7.  Anticoagulation responsible provider: Ladona Ridgel MD, Sharlot Gowda.  INR POC: 2.6.  Cuvette Lot#: [id number].  Exp: 01/2011.    Anticoagulation Management Assessment/Plan:      The patient's current anticoagulation dose is Coumadin 5 mg tabs: Take as directed by coumadin clinic..  The target INR is 2.0-3.0.  The next INR is due 12/27/2009.  Anticoagulation instructions were given to patient.  Results were reviewed/authorized by Eda Keys, PharmD.  He was notified by Eda Keys.         Prior Anticoagulation Instructions: INR 2.1  Continue taking 1 tablet (5 mg) every day.  Return to clinic in 4 weeks.    Current Anticoagulation Instructions: INR 2.6  Continue taking 1 tablet every day.  Return to clinic in 4 weeks.

## 2010-08-03 NOTE — Medication Information (Signed)
Summary: Aaron Frye  Anticoagulant Therapy  Managed by: Eda Keys, PharmD Referring MD: Charlton Haws MD Supervising MD: Daleen Squibb MD, Maisie Fus Indication 1: Atrial Fibrillation (ICD-427.31) Lab Used: [facility] Site: [facility] INR POC 2.1 INR RANGE 2 - 3  Dietary changes: no    Health status changes: no    Bleeding/hemorrhagic complications: no    Recent/future hospitalizations: no    Any changes in medication regimen? no    Recent/future dental: no  Any missed doses?: yes     Details: missed two doses for a dental appt  Is patient compliant with meds? yes       Allergies: 1)  Penicillin V Potassium (Penicillin V Potassium)  Anticoagulation Management History:      The patient is taking warfarin and comes in today for a routine follow up visit.  Positive risk factors for bleeding include an age of 75 years or older and history of CVA/TIA.  The bleeding index is 'intermediate risk'.  Positive CHADS2 values include History of HTN, Age > 4 years old, and Prior Stroke/CVA/TIA.  The start date was 11/28/2007.  His last INR was 2.7.  Anticoagulation responsible provider: Daleen Squibb MD, Maisie Fus.  INR POC: 2.1.  Cuvette Lot#: [id number].  Exp: 12/2010.    Anticoagulation Management Assessment/Plan:      The patient's current anticoagulation dose is Coumadin 5 mg tabs: Take as directed by coumadin clinic..  The target INR is 2.0-3.0.  The next INR is due 11/29/2009.  Anticoagulation instructions were given to patient.  Results were reviewed/authorized by Eda Keys, PharmD.  He was notified by Eda Keys.         Prior Anticoagulation Instructions: INR 2.4 Continue 5mg s daily. Recheck in 4 weeks.   Current Anticoagulation Instructions: INR 2.1  Continue taking 1 tablet (5 mg) every day.  Return to clinic in 4 weeks.

## 2010-08-08 ENCOUNTER — Encounter: Payer: Self-pay | Admitting: Cardiovascular Disease

## 2010-08-08 ENCOUNTER — Encounter (INDEPENDENT_AMBULATORY_CARE_PROVIDER_SITE_OTHER): Payer: Medicare Other

## 2010-08-08 DIAGNOSIS — Z7901 Long term (current) use of anticoagulants: Secondary | ICD-10-CM

## 2010-08-08 DIAGNOSIS — I4891 Unspecified atrial fibrillation: Secondary | ICD-10-CM

## 2010-08-08 LAB — CONVERTED CEMR LAB: POC INR: 3.4

## 2010-08-17 NOTE — Medication Information (Signed)
Summary: Coumadin Clinic  Anticoagulant Therapy  Managed by: Weston Brass, PharmD Referring MD: Charlton Haws MD Supervising MD: Eden Emms MD, Theron Arista Indication 1: Atrial Fibrillation (ICD-427.31) Lab Used: [facility] Site: [facility] INR POC 3.4 INR RANGE 2 - 3  Dietary changes: no    Health status changes: no    Bleeding/hemorrhagic complications: no    Recent/future hospitalizations: no    Any changes in medication regimen? no    Recent/future dental: no  Any missed doses?: no       Is patient compliant with meds? yes       Allergies: 1)  Penicillin V Potassium (Penicillin V Potassium)  Anticoagulation Management History:      The patient is taking warfarin and comes in today for a routine follow up visit.  Positive risk factors for bleeding include an age of 33 years or older and history of CVA/TIA.  The bleeding index is 'intermediate risk'.  Positive CHADS2 values include History of CHF, History of HTN, Age > 66 years old, and Prior Stroke/CVA/TIA.  The start date was 11/28/2007.  His last INR was 2.7.  Anticoagulation responsible provider: Eden Emms MD, Theron Arista.  INR POC: 3.4.  Cuvette Lot#: [id number].  Exp: 08/2011.    Anticoagulation Management Assessment/Plan:      The patient's current anticoagulation dose is Coumadin 5 mg tabs: Take as directed by coumadin clinic..  The target INR is 2.0-3.0.  The next INR is due 08/22/2010.  Anticoagulation instructions were given to patient.  Results were reviewed/authorized by Weston Brass, PharmD.  He was notified by Margot Chimes PharmD Candidate.         Prior Anticoagulation Instructions: INR 2.9  Coumadin 5 mg tablets - Take 1 tablet every day   Current Anticoagulation Instructions: INR 3.4   Take 1/2 tablet of your Coumadin today and then resume your normal dose of 1 tablet daily.

## 2010-08-22 ENCOUNTER — Encounter: Payer: Self-pay | Admitting: Cardiovascular Disease

## 2010-08-22 ENCOUNTER — Encounter (INDEPENDENT_AMBULATORY_CARE_PROVIDER_SITE_OTHER): Payer: Medicare Other

## 2010-08-22 DIAGNOSIS — I4891 Unspecified atrial fibrillation: Secondary | ICD-10-CM

## 2010-08-22 DIAGNOSIS — Z7901 Long term (current) use of anticoagulants: Secondary | ICD-10-CM

## 2010-08-22 DIAGNOSIS — G459 Transient cerebral ischemic attack, unspecified: Secondary | ICD-10-CM

## 2010-08-22 DIAGNOSIS — I6350 Cerebral infarction due to unspecified occlusion or stenosis of unspecified cerebral artery: Secondary | ICD-10-CM

## 2010-08-29 NOTE — Medication Information (Signed)
Summary: rov/tm  Anticoagulant Therapy  Managed by: Weston Brass, PharmD Referring MD: Charlton Haws MD Supervising MD: Excell Seltzer MD, Casimiro Needle Indication 1: Atrial Fibrillation (ICD-427.31) Lab Used: [facility] Site: [facility] INR RANGE 2 - 3  Dietary changes: no    Health status changes: no    Bleeding/hemorrhagic complications: no    Recent/future hospitalizations: no    Any changes in medication regimen? no    Recent/future dental: no  Any missed doses?: no       Is patient compliant with meds? yes       Allergies: 1)  Penicillin V Potassium (Penicillin V Potassium)  Anticoagulation Management History:      The patient is taking warfarin and comes in today for a routine follow up visit.  Positive risk factors for bleeding include an age of 75 years or older and history of CVA/TIA.  The bleeding index is 'intermediate risk'.  Positive CHADS2 values include History of CHF, History of HTN, Age > 55 years old, and Prior Stroke/CVA/TIA.  The start date was 11/28/2007.  His last INR was 2.7.  Anticoagulation responsible provider: Excell Seltzer MD, Casimiro Needle.  Cuvette Lot#: [id number].  Exp: 07/2011.    Anticoagulation Management Assessment/Plan:      The patient's current anticoagulation dose is Coumadin 5 mg tabs: Take as directed by coumadin clinic..  The target INR is 2.0-3.0.  The next INR is due 09/19/2010.  Anticoagulation instructions were given to patient.  Results were reviewed/authorized by Weston Brass, PharmD.  He was notified by Margot Chimes PharmD Candidate.         Prior Anticoagulation Instructions: INR 3.4   Take 1/2 tablet of your Coumadin today and then resume your normal dose of 1 tablet daily.    Current Anticoagulation Instructions: INR 2.5   Continue to take 1 tablet (5mg ) everyday.  Recheck INR in 4 weeks.

## 2010-09-19 ENCOUNTER — Ambulatory Visit (INDEPENDENT_AMBULATORY_CARE_PROVIDER_SITE_OTHER): Payer: Medicare Other | Admitting: *Deleted

## 2010-09-19 DIAGNOSIS — G459 Transient cerebral ischemic attack, unspecified: Secondary | ICD-10-CM

## 2010-09-19 DIAGNOSIS — I4891 Unspecified atrial fibrillation: Secondary | ICD-10-CM

## 2010-09-19 DIAGNOSIS — I6350 Cerebral infarction due to unspecified occlusion or stenosis of unspecified cerebral artery: Secondary | ICD-10-CM

## 2010-09-19 NOTE — Patient Instructions (Signed)
Continue on same dosage 5mg daily.  Recheck in 4 weeks.   

## 2010-10-13 ENCOUNTER — Encounter: Payer: Self-pay | Admitting: Cardiovascular Disease

## 2010-10-17 ENCOUNTER — Encounter: Payer: Medicare Other | Admitting: *Deleted

## 2010-10-18 ENCOUNTER — Encounter: Payer: Self-pay | Admitting: Cardiovascular Disease

## 2010-10-18 ENCOUNTER — Ambulatory Visit (INDEPENDENT_AMBULATORY_CARE_PROVIDER_SITE_OTHER): Payer: Medicare Other | Admitting: Cardiovascular Disease

## 2010-10-18 ENCOUNTER — Ambulatory Visit (INDEPENDENT_AMBULATORY_CARE_PROVIDER_SITE_OTHER): Payer: Medicare Other | Admitting: *Deleted

## 2010-10-18 DIAGNOSIS — I4891 Unspecified atrial fibrillation: Secondary | ICD-10-CM

## 2010-10-18 DIAGNOSIS — E785 Hyperlipidemia, unspecified: Secondary | ICD-10-CM

## 2010-10-18 DIAGNOSIS — G459 Transient cerebral ischemic attack, unspecified: Secondary | ICD-10-CM

## 2010-10-18 DIAGNOSIS — I10 Essential (primary) hypertension: Secondary | ICD-10-CM

## 2010-10-18 DIAGNOSIS — I6350 Cerebral infarction due to unspecified occlusion or stenosis of unspecified cerebral artery: Secondary | ICD-10-CM

## 2010-10-18 DIAGNOSIS — R609 Edema, unspecified: Secondary | ICD-10-CM

## 2010-10-18 MED ORDER — PRAVASTATIN SODIUM 40 MG PO TABS: 40.0000 mg | ORAL_TABLET | Freq: Every evening | ORAL | Status: DC

## 2010-10-18 NOTE — Assessment & Plan Note (Signed)
Good rate control and anticoagulation.  Asymptomatic

## 2010-10-18 NOTE — Assessment & Plan Note (Signed)
Change to generic pravastatin and f/u labs in 6 months

## 2010-10-18 NOTE — Progress Notes (Signed)
Aaron Frye is seen today for f/U of HTN, chronic afib and coumadin therapy.  He is doing well and continues to work daily at the [employer].  His coumadin dose has been decreased a bit.  He has not had any bleeding porblems and sees the clinic monthly. His rate contol has been fine with no palpitations, SSCP or dyspnea.  His EF has been in the 35-40% range.  He has had no congestive symptoms.  His last echo suggested MS but I think it is more functional MAC  He has some large varicosities in the RLE but is not interested in injection Rx.  He is interested in a cheaper generic statin and I think we can change him to pravastatin  ROS: Denies fever, malais, weight loss, blurry vision, decreased visual acuity, cough, sputum, SOB, hemoptysis, pleuritic pain, palpitaitons, heartburn, abdominal pain, melena, lower extremity edema, claudication, or rash.   General: Affect appropriate Healthy:  appears stated age HEENT: normal Neck supple with no adenopathy JVP normal no bruits no thyromegaly Lungs clear with no wheezing and good diaphragmatic motion Heart:  S1/S2 no murmur,rub, gallop or click PMI normal Abdomen: benighn, BS positve, no tenderness, no AAA no bruit.  No HSM or HJR Distal pulses intact with no bruits No edema Neuro non-focal Skin warm and dry No muscular weakness   Current Outpatient Prescriptions  Medication Sig Dispense Refill  . aspirin 81 MG tablet Take 81 mg by mouth daily.        . hydrochlorothiazide (,MICROZIDE/HYDRODIURIL,) 12.5 MG capsule Take 12.5 mg by mouth daily.        . metoprolol (TOPROL-XL) 100 MG 24 hr tablet 50 mg. 1/2 tab po qd      . ramipril (ALTACE) 5 MG tablet Take 5 mg by mouth daily.        . rosuvastatin (CRESTOR) 20 MG tablet Take 20 mg by mouth daily.        [name] warfarin (COUMADIN) 5 MG tablet Take by mouth as directed.          Allergies  Penicillins  Electrocardiogram:  Assessment and Plan

## 2010-10-18 NOTE — Patient Instructions (Addendum)
Your physician recommends that you schedule a follow-up appointment in: 6 months with Dr. Eden Emms Your physician has recommended you make the following change in your medication: Stop Crestor. START Pravastatin 40mg  daily

## 2010-10-18 NOTE — Assessment & Plan Note (Signed)
Secondary to varicosities.  Continue diuretic and support hose

## 2010-10-18 NOTE — Assessment & Plan Note (Signed)
Well controlled.  Continue current medications and low sodium Dash type diet.    

## 2010-10-20 ENCOUNTER — Encounter: Payer: Medicare Other | Admitting: *Deleted

## 2010-10-20 ENCOUNTER — Ambulatory Visit: Payer: Medicare Other | Admitting: Cardiovascular Disease

## 2010-11-14 ENCOUNTER — Ambulatory Visit (INDEPENDENT_AMBULATORY_CARE_PROVIDER_SITE_OTHER): Payer: Medicare Other | Admitting: *Deleted

## 2010-11-14 DIAGNOSIS — G459 Transient cerebral ischemic attack, unspecified: Secondary | ICD-10-CM

## 2010-11-14 DIAGNOSIS — Z7901 Long term (current) use of anticoagulants: Secondary | ICD-10-CM | POA: Insufficient documentation

## 2010-11-14 DIAGNOSIS — I4891 Unspecified atrial fibrillation: Secondary | ICD-10-CM

## 2010-11-14 DIAGNOSIS — I6350 Cerebral infarction due to unspecified occlusion or stenosis of unspecified cerebral artery: Secondary | ICD-10-CM

## 2010-11-14 NOTE — H&P (Signed)
Aaron Frye, BOVENZI NO.:  [id number]   MEDICAL RECORD NO.:  [id number]          PATIENT TYPE:  EMS   LOCATION:  [facility]                         FACILITY:  [facility]   PHYSICIAN:  Madaline Savage, MD        DATE OF BIRTH:  [birth year]   DATE OF ADMISSION:  11/26/2007  DATE OF DISCHARGE:                              HISTORY & PHYSICAL   PRIMARY CARE PHYSICIAN:  [facility].   CHIEF COMPLAINT:  Weakness.   HISTORY OF PRESENT ILLNESS:  Mr. Oommen is an 77 year old gentleman  whose only past medical history is prostate cancer status post  prostatectomy who comes in with a weakness.  He apparently got out of  car today, and his keys fell down.  When he tried to pick it up, he felt  weak in both lower extremities and he sat down.  He was found sitting on  the floor as per the family and was unable to get up.  He denies any  loss of consciousness, any headaches, any chest pain and shortness of  breath or any confusion.  He now feels he is back to his baseline.  He  was brought to the emergency room and he was found to be in atrial  fibrillation.   PAST MEDICAL HISTORY:  History of prostate cancer status post  prostatectomy.   ALLERGIES:  PENICILLIN.   CURRENT MEDICATIONS:  None.   SOCIAL HISTORY:  Denies any history of smoking, alcohol or drug abuse.   FAMILY HISTORY:  Noncontributory.   REVIEW OF SYSTEMS:  GENERAL:  He denies any recent weight loss or weight  gain.  No fever or chills.  HEENT:  No headaches, no blurred vision.  No  sore throat.  CARDIOVASCULAR:.  Denies chest pain or palpitations.  RESPIRATORY:  No shortness of cough.  GI:  No abdominal pain, nausea,  vomiting, diarrhea, constipation.  EXTREMITIES:  No tingling numbness in  toes or fingers.  NEUROLOGICAL:  He is alert and oriented x3.   PHYSICAL EXAMINATION:  VITAL SIGNS:  Temperature 98.1, pulse rate of 66-  102 per minute, regular, blood pressure 157/103, respiratory 22, oxygen  97%  room air.  HEENT: Head atraumatic, normocephalic.  Pupils bilaterally equal and  reactive to light.  Mucous membranes moist.  NECK:  Supple.  No JVD, no carotid bruits.  CARDIOVASCULAR:  S1, S2 heard.  Irregular.  CHEST:  Clear to auscultation.  ABDOMEN:  Soft.  Bowel sounds heard.  EXTREMITIES:  No edema, cyanosis or clubbing.  CENTRAL NERVOUS SYSTEM:  No focal neurological deficits.   LABORATORY DATA:  White count of 13.1, hemoglobin 13.6, platelets 330.  Sodium 143, potassium 3.9, chloride 108, bicarb 28, BUN 14, creatinine  0.65, glucose 152.  Urinalysis is negative.  CT of the head shows no  acute intracranial findings, atrophy and minimal white matter disease.  EKG shows atrial fibrillation at a rate of 110 per minute.   IMPRESSION:  1. New-onset atrial fibrillation.  2. Lower extremity weakness.  3. Leukocytosis.   PLAN:  1. New-onset atrial fibrillation.  Mr. Scott is  an 77 year old      gentleman with no prior cardiac history, comes in with a new onset      atrial fibrillation.  He is rate-controlled at this time.  The      etiology of his atrial fibrillation is not clear.  I will get a 2-D      echocardiogram to look for any structural heart disease.  I will      get a set of cardiac markers on him.  We will also check a TSH on      him.  We can consider a cardiology consult for possible      cardioversion.  I will hold anticoagulation until his MRI is done.  2. Lower extremity weakness.  He did complain of lower extremity      weakness which is improved.  He now feels that he is back to his      baseline.  In the setting of his atrial fibrillation, I worry about      a TIA on this patient.  I will get a MRI and MRA of the brain on      him.  We will start him on aspirin at this time.  3. Leukocytosis.  This is most likely related to stress.  I will watch      him at this time.  4. I will put him on DVT prophylaxis.      Madaline Savage, MD  Electronically  Signed     PKN/MEDQ  D:  11/26/2007  T:  11/26/2007  Job:  [id number]

## 2010-11-14 NOTE — Assessment & Plan Note (Signed)
[facility]                            CARDIOLOGY OFFICE NOTE   NAME:Aaron Frye, Aaron Frye                     MRN:          [id number]  DATE:12/25/2007                            DOB:          04/22/1925    Aaron Frye is seen today in followup.  I initially saw him in the  hospital at the end of May after having an acute left frontoparietal  infarct.  He was found to be in AFib.  He was asymptomatic from this  other than his CVA.  He is not having palpitations, PND, or orthopnea.  There has been no shortness of breath or syncope.   As I recall, his echo showed normal LV function.   We thought that rate control and anticoagulation, all of that was in  order since his AFib was hemodynamically asymptomatic.  The patient is  doing well.  His INR has been in a fairly good range, being followed  closely in our Coumadin Clinic.   Apparently,  he sees a doctor at [facility].  His  Lopressor dose has been decreased.  I do not know why, although at 77  years old, he may have had excessive bradycardia.  He has not had any  presyncope.  We will try to call [facility] and see why  his metoprolol dose was adjusted.  Other than this, he has had  hyperlipidemia and was placed on a statin drug and had hypertension and  has been placed on a diuretic and ACE inhibitor.  He seems to be doing  well.  Since he left the hospital, he has been little bit more  lethargic.  His wife sends him home from work for nap from time to time.  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   Other than that he feels that his memory is a little bit worse since  having a CVA.  He has not had any bleeding diathesis.   REVIEW OF SYSTEMS:  Otherwise negative.   MEDICATIONS:  1. He is on Crestor 20 a day.  2. Hydrochlorothiazide 12.5 a day.  3. Metoprolol, his dose has been reduced.  I cannot quite tell whether      he is taking a half a tablet twice a  day or once a day.  4. Ramipril 5 a day.  5. Baby aspirin a day.   EXAMINATION:  GENERAL:  Remarkable for an elderly white male with  significant cervical spondylosis.  VITAL SIGNS:  Weight is 182, blood pressure is 124/71, pulse 82 and  regular, respiratory rate 14, and afebrile.  NECK:  Carotids are normal without bruit.  No lymphadenopathy,  thyromegaly, or JVP elevation.  LUNGS:  Clear.  Good diaphragmatic motion.  No wheezing.  S1 and S2.  Normal heart sounds.  PMI normal.  ABDOMEN:  Benign.  Bowel sounds positive.  No AAA, no tenderness, no  bruit, no hepatosplenomegaly, no hepatojugular reflux.  EXTREMITIES:  Distal pulses are intact.  No edema.  NEUROLOGIC:  Nonfocal.  SKIN:  Warm and dry.  No muscular weakness.   IMPRESSION:  1. Atrial fibrillation, currently asymptomatic.  Rate control with      beta-blocker.  I will call [facility] to see why      dose of Lopressor was decreased.  Continue to follow up in the      Coumadin Clinic, therapeutic INRs.  2. Hypertension, currently well-controlled.  Continue low dose      diuretic and ramipril.  3. Hyperlipidemia.  Continue Crestor.  Lipid and liver profile in 6      months.  4. Recent parietotemporal cerebrovascular accident with minimal      deficits outside of memory.  5. Follow up with Neurology as needed.  6. Overall, I think [name] is doing well and I will see him back in 3      months.     Noralyn Pick. Eden Emms, MD, [facility]  Electronically Signed    PCN/MedQ  DD: 12/25/2007  DT: 12/26/2007  Job #: [id number]

## 2010-11-14 NOTE — Assessment & Plan Note (Signed)
[facility]                            CARDIOLOGY OFFICE NOTE   NAME:Aaron Frye, Aaron Frye                     MRN:          [id number]  DATE:04/27/2008                            DOB:          1925/03/21    Aaron Frye returns today for followup.  He just had a followup in the  Coumadin Clinic.  His INR was little low at 1.8.  He has a history of  atrial fibrillation that we have chosen not to cardiovert him with rate  control and anticoagulation.  He has been asymptomatic.   He had a CVA in the hospital a few months back.   He is doing well.  He is recovered without residual sequelae.  He is  still working at his florist.  He actually has extended his hours now to  4 to 4:30 and his wife has to send him home sometimes.  He was asking  for a handicap placard today which at his age I think it is reasonable.  He particularly has a problem walking long distances to the [employer]  basketball game.   He has not had any significant palpitations, chest pain, PND, or  orthopnea.  He has not had any recurrent TIAs.   The patient has good LV function by echo done on Nov 27, 2007.  Actually, the patient has some decreased LV function with an EF being 35-  40% by echo in May 2009.   However, he has not had any significant CHF.   Talking to the patient, he is active.  He sleeps at night.  He is not  having any congestive symptoms.   He is allergic to PENICILLINS.   CURRENT MEDICATIONS:  1. Crestor 20 a day.  2. Hydrochlorothiazide 12.5 a day.  3. Warfarin as directed.  4. Ramipril 5 a day.  5. Aspirin a day.  6. Lopressor 12.5 mg b.i.d.   His exam is remarkable for blood pressure 130/70, pulse 82 and regular,  weight 188.  HEENT:  Unremarkable.  Carotids are without bruit.  No lymphadenopathy, thyromegaly, or JVP  elevation.  LUNGS: Clear, good diaphragmatic motion. No wheezing.  S1 and S2 with normal heart sounds.  PMI normal.  ABDOMEN:  Benign.  Bowel  sounds positive.  No AAA, no tenderness, no  bruit, no hepatosplenomegaly, no hepatojugular reflux.  Distal pulses intact. No edema.  NEURO:  Nonfocal.  SKIN:  Warm and dry.  No muscular weakness.   IMPRESSION:  1. Atrial fibrillation, chronic.  Continue beta-blocker and      anticoagulation.  Follow up Coumadin Clinic in 4 weeks.  2. Hypertension, currently well controlled.  Continue current dose of      medication including low-sodium diet.  3. Decreased left ventricular function, not having congestive      symptoms.  Continue current dose of ramipril and diuretic.  Follow      up left ventricular function in 6 months by echo.  4. Hyperlipidemia.  Continue Crestor.  Lipid and liver profile in 6      months.  5. A handicap placard was given to the  patient.     Noralyn Pick. Eden Emms, MD, [facility]  Electronically Signed    PCN/MedQ  DD: 04/27/2008  DT: 04/27/2008  Job #: [id number]

## 2010-11-14 NOTE — Consult Note (Signed)
NAME:  Aaron Frye, Aaron Frye              ACCOUNT NO.:  [id number]   MEDICAL RECORD NO.:  [id number]          PATIENT TYPE:  INP   LOCATION:  6527                         FACILITY:  [facility]   PHYSICIAN:  Noralyn Pick. Eden Emms, MD, FACCDATE OF BIRTH:  Jun 16, 1925   DATE OF CONSULTATION:  11/28/2007  DATE OF DISCHARGE:                                 CONSULTATION   This is an 77 year old patient admitted to the hospital with question of  a TIA and weakness found to be in AFib.   The patient was getting out of his car on May 27, he dropped his keys,  he fell weak in both lower extremities and sat down.  He was unable to  get up.  There was no loss of consciousness.  There was no headache or  other focal neurological signs.   In the emergency room, he was found to be in AFib.  In talking to the  patient, he has never noticed any palpitations.  He has not had previous  syncope.  There is no documented history of coronary artery disease.  The patient in general does not fall.  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   The patient subsequently has had a workup including a negative MRI and  CT.  There has been no permanent neurological sequelae.   He is currently on heparin and has had a dose of Coumadin.   The patient had a 2D echocardiogram that was read by Dr. Reyes Ivan.  I  disagree with the reading.  He indicated moderate decrease in LV  function.  The patient has small left ventricular cavity size.  His EF  is 50%-55%.  He also read his echo as moderate mitral stenosis, which I  disagree with.  The patient has severe MAC of the posterior leaflet,  however, by pressure half-time his mitral valve area was in excess of 3.  His gradient was only 9 mmHg which is frequently overestimated by  Doppler.  I think at the most he has mild mitral stenosis which is  functional due to severe mitral annular calcification.   However, the patient is currently back to his normal state.  He  is  asymptomatic.  He is not a candidate for cardioversion.  Given his age,  the best approach would be rate control and anticoagulation.   REVIEW OF SYSTEMS:  Otherwise remarkable for some right heel pain.  He  may have a bone spur, otherwise negative.   ALLERGIES:  He is allergic to PENICILLIN.   MEDICATIONS:  He was not taking medications prior to coming to the  hospital.  In the hospital, he is currently on 81 mg of aspirin,  warfarin as directed, Crestor 20 a day, and Lopressor 50 b.i.d.   SOCIAL HISTORY:  The patient is happily married.  I spoke to his wife on  the phone today to let her know what was going on with his atrial  fibrillation.  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  His  son works with him and I left a message with Cass today  as well.  He  does not smoke or drink.   FAMILY HISTORY:  Noncontributory.   PHYSICAL EXAMINATION:  GENERAL:  Remarkable for being afebrile.  VITAL SIGNS:  Heart rate is 80 and irregular, blood pressure is  currently 140/80, respirations are 16, afebrile, and room air  saturations are 98%.  HEENT:  Unremarkable.  NECK:  Carotids are without bruit.  No lymphadenopathy, thyromegaly, or  JVP elevation.  LUNGS:  Clear to diaphragmatic motion.  No wheezing.  CARDIAC:  S1 and S2.  There is no diastolic rumble.  There is a faint MR  murmur at the apex.  PMI is normal.  ABDOMEN:  Benign bowel sounds positive.  No AAA.  No hepatosplenomegaly.  No hepatojugular reflux.  No tenderness.  He is status post  prostatectomy.  EXTREMITIES:  Distal pulses are intact with no edema.  NEURO:  Nonfocal.  SKIN:  Warm and dry.  MUSCULOSKELETAL:  No muscular weakness.   LABORATORY DATA:  EKG shows atrial fibrillation with no other  significant changes.  The patient has not had a BMET.  Hemoglobin and  hematocrit are stable.  Enzymes are negative.  Chest x-ray shows no  active disease.   IMPRESSION:  1. Atrial fibrillation, unknown duration.   Continue with      coumadinization.  We will ask discharge planning to facilitate      possibility of home Lovenox, heparin, or Lovenox should not be      discontinued until his INR is above 2.2.  He will followup in our      Coumadin Clinic for rate control with b.i.d. Lopressor should      suffice.  2. Abnormal echo.  Again, I disagree with the reading by Dr. Reyes Ivan.      I think his left ventricular function is low normal and somewhat      difficult to interpret due to his atrial fibrillation.  I also      disagree with the fact that he has moderate mitral stenosis.  The      patient may benefit from followup MUGA scan or MRI to quantitate      his ejection fraction.  For the time being, he is asymptomatic and      does not need a diuretic or ACE inhibitor.  3. Previous prostatectomy, good urinary flow.  No need for Flomax or      other medications.  Will need PSA.  Followed by Dr. Christell Constant every      year at least.  4. Mitral valve disease.  The patient has mitral annular calcification      with mild mitral stenosis and mild mitral insufficiency.  Given the      patient's advanced age, he will not likely need any aggressive      workup for this, although I am sure it has something to do with him      developing atrial fibrillation, although his biggest risk factor      for atrial fibrillation is his age.   I went over in detail the risks of being on Coumadin with the patient.  His mother was on Coumadin and he is familiar with it.  He is a good  candidate for it.  He understands the need for monitoring and the risk  for bleeding.  I also went over issues regarding dietary restraints and  need to check Coumadin more frequently if antibiotics are started.   The patient will followup [facility] as an outpatient  including  our Coumadin Clinic.      Noralyn Pick. Eden Emms, MD, [facility]  Electronically Signed     PCN/MEDQ  D:  11/28/2007  T:  11/29/2007  Job:  [id number]

## 2010-11-14 NOTE — Discharge Summary (Signed)
NAMEGLADE, STRAUSSER NO.:  [id number]   MEDICAL RECORD NO.:  [id number]          PATIENT TYPE:  INP   LOCATION:  4703                         FACILITY:  [facility]   PHYSICIAN:  Altha Harm, MDDATE OF BIRTH:  [birth year]   DATE OF ADMISSION:  11/25/2007  DATE OF DISCHARGE:  12/01/2007                               DISCHARGE SUMMARY   DISCHARGE DISPOSITION:  Home.   FINAL DISCHARGE DIAGNOSES:  1. Acute left frontoparietal infarct.  2. Atrial fibrillation with rapid ventricular rate, heart rate now      controlled.  3. Hypertension.  4. Hyperlipidemia.  5. Systolic dysfunction with decreased ejection fraction.   DISCHARGE MEDICATIONS:  1. Coumadin 5 mg p.o. daily, initially to be titrated by Dr. Fabio Bering      office when INR is checked tomorrow.  2. Metoprolol XL 100 mg p.o. daily.  3. Crestor 20 mg p.o. daily.  4. Aspirin 81 mg p.o. daily.  5. Hydrochlorothiazide 12.5 mg p.o. daily.  6. Altace 5 mg p.o. daily.   CONSULTANTS:  Peter C. Eden Emms, MD,  [facility], Cardiology.   PROCEDURES:  None.   DIAGNOSTIC STUDIES:  1. CT head without contrast done on admission, which shows no acute      inferior abnormality.  2. Impression, atrophy and minimal white matter disease.  3. MRA of the head, without contrast, which the MRI shows an acute 2-3      cm region of infarction within the left frontoparietal region with      a small area of acute infarct in the insula, most consistent with      areas of embolic infarction.  No sign of hemorrhage.  4. MRA of the head, which shows a negative intracranial MR angiography      of the left medium-sized vessels.  5. A 2-D echocardiogram, official report showing EF ranging between      35% to 40%; however, please note, Dr. Eden Emms looked at the      echocardiogram in consultation and this agrees with the EF stating      that he believes the EF to be more within the range of 50%.  6. Carotid Doppler was done on Nov 27, 2007,  which shows bilateral      mild homogenous plaque in the bifurcation on the right, 40% to 60%      ICA stenosis; left, no ICA stenosis.  The right vertebral artery      was not visualized and left vertebral artery flow was antegrade.   ALLERGIES:  PENICILLIN.   CODE STATUS:  Full code.   CHIEF COMPLAINT:  Weakness.   HISTORY OF PRESENT ILLNESS:  Please see the H and P dictated by Dr. Darene Lamer  for details of the HPI.   HOSPITAL COURSE:  The patient was admitted to the hospital and MRI  revealed that the patient had a CVA.  The patient, however, had very  little initial deficit and at this time has no residual deficit as a  result.  The patient was evaluated by physical therapy, occupational,  speech therapy, and found to have no further  rehab needs.  The patient  was put on medications for risk factor modification including, Crestor  and aspirin.  It was felt that the patient's etiology of his stroke  embolic in nature, particularly, given the fact that the new onset of  atrial fibrillation with rapid ventricular rate.  The family had some  question about ablation or cardioversion, and requested a consult from  Dr. Eden Emms.  He was consulted at the request of the family and agreed  that this patient was not a candidate for cardioversion at this time.  However, he will follow the patient in the office for his atrial  fibrillation.  The patient was also found to have hypertension, new  onset and was treated with metoprolol and hydrochlorothiazide.  In light  of the fact that the patient does have a decreased ejection fraction, he  was started on Altace 5 mg p.o. daily in addition.  The patient has been  medically stable waiting for his Coumadin to be titrated to an  appropriate INR and the patient has had an INR within the therapeutic  range in last 48 hours, thus he is being discharged home on a dose of  Coumadin 5 mg.  The patient is to have his INR checked on Friday Dr.  Fabio Bering  office.   All this has been discussed with the patient, his wife, and his daughter  Ms. Evon Slack.  The patient's condition at this time is stable.   PHYSICAL EXAMINATION:  VITAL SIGNS:  As follows with temperature 97.7,  heart rate 69, respiratory rate 20, blood pressure 121/75, and O2 sats  are 98% on room air.   PERTINENT LABS:  His INR today is 2.4 and his PT is 26.5.   Otherwise, his other labs are stable.  His hemoglobin at the time of  discharge is 13.9 and hematocrit of 42.9 with platelet count within  normal range of 277.  The patient has been given diet instructions on  vitamin K deficient foods in relationship to his Coumadin use.  He has  also been given instructions on 2 g sodium diet.   FOLLOWUP:  The patient is to follow up with Dr. Eden Emms in his office on  Friday December 05, 2007, per PT/INR.  The patient was asked to follow up  with Dr. Raquel James, at [facility] within 1-week,  particularly with his new diagnosis of mild systolic dysfunction and  hypertension.      Altha Harm, MD  Electronically Signed     MAM/MEDQ  D:  12/01/2007  T:  12/02/2007  Job:  [id number]

## 2010-12-12 ENCOUNTER — Ambulatory Visit (INDEPENDENT_AMBULATORY_CARE_PROVIDER_SITE_OTHER): Payer: Medicare Other | Admitting: *Deleted

## 2010-12-12 DIAGNOSIS — I4891 Unspecified atrial fibrillation: Secondary | ICD-10-CM

## 2010-12-12 DIAGNOSIS — G459 Transient cerebral ischemic attack, unspecified: Secondary | ICD-10-CM

## 2010-12-12 DIAGNOSIS — I6350 Cerebral infarction due to unspecified occlusion or stenosis of unspecified cerebral artery: Secondary | ICD-10-CM

## 2010-12-12 LAB — POCT INR: INR: 3.2

## 2010-12-19 ENCOUNTER — Other Ambulatory Visit: Payer: Self-pay | Admitting: *Deleted

## 2010-12-19 MED ORDER — WARFARIN SODIUM 5 MG PO TABS: ORAL_TABLET | ORAL | Status: DC

## 2011-01-09 ENCOUNTER — Ambulatory Visit (INDEPENDENT_AMBULATORY_CARE_PROVIDER_SITE_OTHER): Payer: Medicare Other | Admitting: *Deleted

## 2011-01-09 DIAGNOSIS — I4891 Unspecified atrial fibrillation: Secondary | ICD-10-CM

## 2011-01-09 DIAGNOSIS — I6350 Cerebral infarction due to unspecified occlusion or stenosis of unspecified cerebral artery: Secondary | ICD-10-CM

## 2011-01-09 DIAGNOSIS — G459 Transient cerebral ischemic attack, unspecified: Secondary | ICD-10-CM

## 2011-01-09 LAB — POCT INR: INR: 2.9

## 2011-01-28 ENCOUNTER — Other Ambulatory Visit: Payer: Self-pay | Admitting: Cardiovascular Disease

## 2011-02-05 ENCOUNTER — Encounter (INDEPENDENT_AMBULATORY_CARE_PROVIDER_SITE_OTHER): Payer: Medicare Other | Admitting: Ophthalmology

## 2011-02-05 DIAGNOSIS — H353290 Exudative age-related macular degeneration, unspecified eye, stage unspecified: Secondary | ICD-10-CM

## 2011-02-05 DIAGNOSIS — H3530 Unspecified macular degeneration: Secondary | ICD-10-CM

## 2011-02-05 DIAGNOSIS — H43819 Vitreous degeneration, unspecified eye: Secondary | ICD-10-CM

## 2011-02-06 ENCOUNTER — Ambulatory Visit (INDEPENDENT_AMBULATORY_CARE_PROVIDER_SITE_OTHER): Payer: Medicare Other | Admitting: *Deleted

## 2011-02-06 DIAGNOSIS — G459 Transient cerebral ischemic attack, unspecified: Secondary | ICD-10-CM

## 2011-02-06 DIAGNOSIS — I6350 Cerebral infarction due to unspecified occlusion or stenosis of unspecified cerebral artery: Secondary | ICD-10-CM

## 2011-02-06 DIAGNOSIS — I4891 Unspecified atrial fibrillation: Secondary | ICD-10-CM

## 2011-02-06 LAB — POCT INR: INR: 2.7

## 2011-03-05 ENCOUNTER — Other Ambulatory Visit: Payer: Self-pay | Admitting: Cardiovascular Disease

## 2011-03-13 ENCOUNTER — Ambulatory Visit (INDEPENDENT_AMBULATORY_CARE_PROVIDER_SITE_OTHER): Payer: Medicare Other | Admitting: *Deleted

## 2011-03-13 DIAGNOSIS — I4891 Unspecified atrial fibrillation: Secondary | ICD-10-CM

## 2011-03-13 DIAGNOSIS — I6350 Cerebral infarction due to unspecified occlusion or stenosis of unspecified cerebral artery: Secondary | ICD-10-CM

## 2011-03-13 DIAGNOSIS — G459 Transient cerebral ischemic attack, unspecified: Secondary | ICD-10-CM

## 2011-03-26 ENCOUNTER — Encounter (INDEPENDENT_AMBULATORY_CARE_PROVIDER_SITE_OTHER): Payer: Medicare Other | Admitting: Ophthalmology

## 2011-03-26 DIAGNOSIS — H3530 Unspecified macular degeneration: Secondary | ICD-10-CM

## 2011-03-26 DIAGNOSIS — H43819 Vitreous degeneration, unspecified eye: Secondary | ICD-10-CM

## 2011-03-26 DIAGNOSIS — H353290 Exudative age-related macular degeneration, unspecified eye, stage unspecified: Secondary | ICD-10-CM

## 2011-03-28 LAB — CBC
HCT: 40.7
HCT: 41.5
Hemoglobin: 12.9 — ABNORMAL LOW
Hemoglobin: 13.2
Hemoglobin: 13.4
Hemoglobin: 13.8
MCHC: 31.8
MCHC: 32.3
MCHC: 33.7
MCV: 73.9 — ABNORMAL LOW
MCV: 73.9 — ABNORMAL LOW
MCV: 74 — ABNORMAL LOW
MCV: 74.2 — ABNORMAL LOW
Platelets: 322
RBC: 5.45
RBC: 5.51
RBC: 5.51
RBC: 5.62
RDW: 17.4 — ABNORMAL HIGH
RDW: 17.5 — ABNORMAL HIGH
WBC: 10.7 — ABNORMAL HIGH
WBC: 11.3 — ABNORMAL HIGH

## 2011-03-28 LAB — CARDIAC PANEL(CRET KIN+CKTOT+MB+TROPI)
Relative Index: INVALID
Relative Index: INVALID
Total CK: 27
Troponin I: 0.03

## 2011-03-28 LAB — RPR: RPR Ser Ql: NONREACTIVE

## 2011-03-28 LAB — DIFFERENTIAL
Basophils Absolute: 0
Basophils Relative: 0
Eosinophils Absolute: 0.3
Eosinophils Relative: 2
Lymphocytes Relative: 11 — ABNORMAL LOW
Lymphs Abs: 1.4
Monocytes Absolute: 0.7
Monocytes Relative: 5
Neutro Abs: 10.7 — ABNORMAL HIGH
Neutrophils Relative %: 82 — ABNORMAL HIGH

## 2011-03-28 LAB — COMPREHENSIVE METABOLIC PANEL
ALT: 17
AST: 23
Albumin: 3.3 — ABNORMAL LOW
Alkaline Phosphatase: 92
BUN: 14
CO2: 28
Calcium: 9
Chloride: 108
Creatinine, Ser: 0.65
GFR calc Af Amer: >60
GFR calc non Af Amer: >60
Glucose, Bld: 152 — ABNORMAL HIGH
Potassium: 3.9
Sodium: 143
Total Bilirubin: 0.6
Total Protein: 6.7

## 2011-03-28 LAB — BASIC METABOLIC PANEL
Chloride: 101
GFR calc Af Amer: >60
Potassium: 4.5

## 2011-03-28 LAB — URINALYSIS, ROUTINE W REFLEX MICROSCOPIC
Protein, ur: NEGATIVE
Urobilinogen, UA: 1

## 2011-03-28 LAB — LIPID PANEL
Cholesterol: 152
HDL: 26 — ABNORMAL LOW
LDL Cholesterol: 104 — ABNORMAL HIGH
Total CHOL/HDL Ratio: 5.8
Triglycerides: 111

## 2011-03-28 LAB — TSH: TSH: 1.571

## 2011-03-28 LAB — CK TOTAL AND CKMB (NOT AT ARMC): Total CK: 27

## 2011-03-28 LAB — TROPONIN I: Troponin I: 0.02

## 2011-03-28 LAB — PROTIME-INR
Prothrombin Time: 14
Prothrombin Time: 14.5

## 2011-03-28 LAB — HEPARIN LEVEL (UNFRACTIONATED): Heparin Unfractionated: 0.37

## 2011-03-28 LAB — URINE MICROSCOPIC-ADD ON

## 2011-03-29 LAB — CBC
HCT: 42.9
MCV: 74.6 — ABNORMAL LOW
RBC: 5.76
WBC: 11.4 — ABNORMAL HIGH

## 2011-03-29 LAB — HEPARIN LEVEL (UNFRACTIONATED): Heparin Unfractionated: 0.36

## 2011-03-29 LAB — PROTIME-INR
INR: 2.4 — ABNORMAL HIGH
Prothrombin Time: 26.5 — ABNORMAL HIGH

## 2011-04-10 ENCOUNTER — Ambulatory Visit (INDEPENDENT_AMBULATORY_CARE_PROVIDER_SITE_OTHER): Payer: Medicare Other | Admitting: *Deleted

## 2011-04-10 DIAGNOSIS — G459 Transient cerebral ischemic attack, unspecified: Secondary | ICD-10-CM

## 2011-04-10 DIAGNOSIS — I4891 Unspecified atrial fibrillation: Secondary | ICD-10-CM

## 2011-04-10 DIAGNOSIS — I6350 Cerebral infarction due to unspecified occlusion or stenosis of unspecified cerebral artery: Secondary | ICD-10-CM

## 2011-05-01 ENCOUNTER — Ambulatory Visit (INDEPENDENT_AMBULATORY_CARE_PROVIDER_SITE_OTHER): Payer: Medicare Other | Admitting: Cardiovascular Disease

## 2011-05-01 ENCOUNTER — Encounter: Payer: Self-pay | Admitting: Cardiovascular Disease

## 2011-05-01 VITALS — BP 143/83 | HR 82 | Ht 70.0 in | Wt 188.0 lb

## 2011-05-01 DIAGNOSIS — I6350 Cerebral infarction due to unspecified occlusion or stenosis of unspecified cerebral artery: Secondary | ICD-10-CM

## 2011-05-01 DIAGNOSIS — I10 Essential (primary) hypertension: Secondary | ICD-10-CM

## 2011-05-01 DIAGNOSIS — E785 Hyperlipidemia, unspecified: Secondary | ICD-10-CM

## 2011-05-01 DIAGNOSIS — I4891 Unspecified atrial fibrillation: Secondary | ICD-10-CM

## 2011-05-01 NOTE — Assessment & Plan Note (Signed)
2009  In setting of afib. Continue coumadin.  INR;s Rx

## 2011-05-01 NOTE — Progress Notes (Signed)
Aaron Frye is seen today for f/U of HTN, chronic afib and coumadin therapy. He is doing well and continues to work daily at the [employer]. His coumadin dose has been decreased a bit. He has not had any bleeding porblems and sees the clinic monthly. His rate contol has been fine with no palpitations, SSCP or dyspnea. His EF has been in the 35-40% range. He has had no congestive symptoms. 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 He is interested in a cheaper generic statin and I think we can change him to pravastatin  ROS: Denies fever, malais, weight loss, blurry vision, decreased visual acuity, cough, sputum, SOB, hemoptysis, pleuritic pain, palpitaitons, heartburn, abdominal pain, melena, lower extremity edema, claudication, or rash.  All other systems reviewed and negative  General: Affect appropriate Healthy:  appears stated age HEENT: normal Neck supple with no adenopathy JVP normal no bruits no thyromegaly Lungs clear with no wheezing and good diaphragmatic motion Heart:  S1/S2 no murmur,rub, gallop or click PMI normal Abdomen: benighn, BS positve, no tenderness, no AAA no bruit.  No HSM or HJR Distal pulses intact with no bruits Plus one RLE edema  With varicosities Neuro non-focal Skin warm and dry No muscular weakness   Current Outpatient Prescriptions  Medication Sig Dispense Refill  . aspirin 81 MG tablet Take 81 mg by mouth daily.        . CRESTOR 20 MG tablet Take 1 tablet by mouth Daily.      . hydrochlorothiazide (,MICROZIDE/HYDRODIURIL,) 12.5 MG capsule TAKE ONE CAPSULE BY MOUTH EVERY MORNING  30 capsule  12  . metoprolol (TOPROL-XL) 100 MG 24 hr tablet TAKE ONE-HALF TABLET BY MOUTH EVERY DAY  15 tablet  6  . ramipril (ALTACE) 5 MG tablet Take 5 mg by mouth daily.        [name] warfarin (COUMADIN) 5 MG tablet Take as directed by Anticoagulation clinic   90 tablet  1     Allergies  Penicillins  Electrocardiogram:  Afib rate 82 nonspecific ST/T wave changes  Assessment and Plan

## 2011-05-01 NOTE — Patient Instructions (Signed)
Your physician wants you to follow-up in: 6 MONTHS You will receive a reminder letter in the mail two months in advance. If you don't receive a letter, please call our office to schedule the follow-up appointment. 

## 2011-05-01 NOTE — Assessment & Plan Note (Signed)
Cholesterol is at goal.  Continue current dose of statin and diet Rx.  No myalgias or side effects.  F/U  LFT's in 6 months. Lab Results  Component Value Date   LDLCALC  Value: 104        Total Cholesterol/HDL:CHD Risk Coronary Heart Disease Risk Table                     Men   Women  1/2 Average Risk   3.4   3.3* 11/26/2007             

## 2011-05-01 NOTE — Assessment & Plan Note (Signed)
Well controlled.  Continue current medications and low sodium Dash type diet.    

## 2011-05-01 NOTE — Assessment & Plan Note (Signed)
Good anticoagulation and rate control

## 2011-05-08 ENCOUNTER — Ambulatory Visit (INDEPENDENT_AMBULATORY_CARE_PROVIDER_SITE_OTHER): Payer: Medicare Other | Admitting: *Deleted

## 2011-05-08 DIAGNOSIS — Z7901 Long term (current) use of anticoagulants: Secondary | ICD-10-CM

## 2011-05-08 DIAGNOSIS — I6350 Cerebral infarction due to unspecified occlusion or stenosis of unspecified cerebral artery: Secondary | ICD-10-CM

## 2011-05-08 DIAGNOSIS — G459 Transient cerebral ischemic attack, unspecified: Secondary | ICD-10-CM

## 2011-05-08 DIAGNOSIS — I4891 Unspecified atrial fibrillation: Secondary | ICD-10-CM

## 2011-05-14 ENCOUNTER — Encounter (INDEPENDENT_AMBULATORY_CARE_PROVIDER_SITE_OTHER): Payer: Medicare Other | Admitting: Ophthalmology

## 2011-06-05 ENCOUNTER — Telehealth: Payer: Self-pay | Admitting: Cardiovascular Disease

## 2011-06-05 ENCOUNTER — Ambulatory Visit (INDEPENDENT_AMBULATORY_CARE_PROVIDER_SITE_OTHER): Payer: Medicare Other | Admitting: *Deleted

## 2011-06-05 DIAGNOSIS — I6350 Cerebral infarction due to unspecified occlusion or stenosis of unspecified cerebral artery: Secondary | ICD-10-CM

## 2011-06-05 DIAGNOSIS — I4891 Unspecified atrial fibrillation: Secondary | ICD-10-CM

## 2011-06-05 DIAGNOSIS — Z7901 Long term (current) use of anticoagulants: Secondary | ICD-10-CM

## 2011-06-05 DIAGNOSIS — G459 Transient cerebral ischemic attack, unspecified: Secondary | ICD-10-CM

## 2011-06-05 LAB — POCT INR: INR: 2.6

## 2011-06-05 NOTE — Telephone Encounter (Signed)
New message:  Pt given samples of Crestor 10mg  but he had been taking 20 mg.  Wants to verify dosage.  Please call him back.

## 2011-06-05 NOTE — Telephone Encounter (Signed)
Verified pts dose of Crestor to be 20mg  daily.  Informed pt to take two of the10mg  tabs to equal his 20mg  daily dose.

## 2011-07-02 ENCOUNTER — Encounter (INDEPENDENT_AMBULATORY_CARE_PROVIDER_SITE_OTHER): Payer: Medicare Other | Admitting: Ophthalmology

## 2011-07-02 DIAGNOSIS — H43819 Vitreous degeneration, unspecified eye: Secondary | ICD-10-CM

## 2011-07-02 DIAGNOSIS — H3530 Unspecified macular degeneration: Secondary | ICD-10-CM

## 2011-07-02 DIAGNOSIS — H353290 Exudative age-related macular degeneration, unspecified eye, stage unspecified: Secondary | ICD-10-CM

## 2011-07-06 ENCOUNTER — Ambulatory Visit (INDEPENDENT_AMBULATORY_CARE_PROVIDER_SITE_OTHER): Payer: Medicare Other | Admitting: *Deleted

## 2011-07-06 DIAGNOSIS — I6350 Cerebral infarction due to unspecified occlusion or stenosis of unspecified cerebral artery: Secondary | ICD-10-CM

## 2011-07-06 DIAGNOSIS — Z7901 Long term (current) use of anticoagulants: Secondary | ICD-10-CM

## 2011-07-06 DIAGNOSIS — G459 Transient cerebral ischemic attack, unspecified: Secondary | ICD-10-CM

## 2011-07-06 DIAGNOSIS — I4891 Unspecified atrial fibrillation: Secondary | ICD-10-CM

## 2011-07-13 ENCOUNTER — Other Ambulatory Visit: Payer: Self-pay | Admitting: Cardiovascular Disease

## 2011-07-13 MED ORDER — RAMIPRIL 5 MG PO TABS: 5.0000 mg | ORAL_TABLET | Freq: Every day | ORAL | Status: DC

## 2011-08-06 ENCOUNTER — Ambulatory Visit (INDEPENDENT_AMBULATORY_CARE_PROVIDER_SITE_OTHER): Payer: Medicare Other | Admitting: *Deleted

## 2011-08-06 DIAGNOSIS — I6350 Cerebral infarction due to unspecified occlusion or stenosis of unspecified cerebral artery: Secondary | ICD-10-CM

## 2011-08-06 DIAGNOSIS — I4891 Unspecified atrial fibrillation: Secondary | ICD-10-CM

## 2011-08-06 DIAGNOSIS — Z7901 Long term (current) use of anticoagulants: Secondary | ICD-10-CM

## 2011-08-06 DIAGNOSIS — G459 Transient cerebral ischemic attack, unspecified: Secondary | ICD-10-CM

## 2011-08-10 ENCOUNTER — Encounter (INDEPENDENT_AMBULATORY_CARE_PROVIDER_SITE_OTHER): Payer: Medicare Other | Admitting: Ophthalmology

## 2011-08-20 ENCOUNTER — Encounter (INDEPENDENT_AMBULATORY_CARE_PROVIDER_SITE_OTHER): Payer: Medicare Other | Admitting: Ophthalmology

## 2011-08-20 DIAGNOSIS — H353290 Exudative age-related macular degeneration, unspecified eye, stage unspecified: Secondary | ICD-10-CM

## 2011-08-20 DIAGNOSIS — H43819 Vitreous degeneration, unspecified eye: Secondary | ICD-10-CM

## 2011-08-20 DIAGNOSIS — H3530 Unspecified macular degeneration: Secondary | ICD-10-CM

## 2011-08-23 ENCOUNTER — Telehealth: Payer: Self-pay | Admitting: *Deleted

## 2011-08-23 ENCOUNTER — Other Ambulatory Visit: Payer: Self-pay | Admitting: *Deleted

## 2011-08-23 MED ORDER — METOPROLOL SUCCINATE ER 100 MG PO TB24: ORAL_TABLET | ORAL | Status: DC

## 2011-08-23 NOTE — Telephone Encounter (Signed)
DR Myrtis Ser CALLED THIS AM PT CALLED LAST NIGHT  STATING NOT ABLE TO GET METOPROLOL FILLED AT PHARMACY THIS NURSE WILL FOLLOW UP ./CY METOPROLOL 100 MG NUMBER 15  WITH 11 REFILLS CALLED INTO [facility].WILL NOTIFY PT./CY

## 2011-08-31 ENCOUNTER — Other Ambulatory Visit: Payer: Self-pay | Admitting: Cardiovascular Disease

## 2011-09-18 ENCOUNTER — Ambulatory Visit (INDEPENDENT_AMBULATORY_CARE_PROVIDER_SITE_OTHER): Payer: Medicare Other | Admitting: *Deleted

## 2011-09-18 DIAGNOSIS — I4891 Unspecified atrial fibrillation: Secondary | ICD-10-CM

## 2011-09-18 DIAGNOSIS — I6350 Cerebral infarction due to unspecified occlusion or stenosis of unspecified cerebral artery: Secondary | ICD-10-CM

## 2011-09-18 DIAGNOSIS — G459 Transient cerebral ischemic attack, unspecified: Secondary | ICD-10-CM

## 2011-09-18 DIAGNOSIS — Z7901 Long term (current) use of anticoagulants: Secondary | ICD-10-CM

## 2011-09-18 LAB — POCT INR: INR: 2.2

## 2011-09-25 ENCOUNTER — Encounter (INDEPENDENT_AMBULATORY_CARE_PROVIDER_SITE_OTHER): Payer: Medicare Other | Admitting: Ophthalmology

## 2011-09-26 ENCOUNTER — Encounter (INDEPENDENT_AMBULATORY_CARE_PROVIDER_SITE_OTHER): Payer: Medicare Other | Admitting: Ophthalmology

## 2011-09-26 DIAGNOSIS — H3530 Unspecified macular degeneration: Secondary | ICD-10-CM

## 2011-09-26 DIAGNOSIS — H43819 Vitreous degeneration, unspecified eye: Secondary | ICD-10-CM

## 2011-09-26 DIAGNOSIS — H353290 Exudative age-related macular degeneration, unspecified eye, stage unspecified: Secondary | ICD-10-CM

## 2011-10-23 ENCOUNTER — Ambulatory Visit (INDEPENDENT_AMBULATORY_CARE_PROVIDER_SITE_OTHER): Payer: Medicare Other | Admitting: Cardiovascular Disease

## 2011-10-23 ENCOUNTER — Encounter: Payer: Self-pay | Admitting: Cardiovascular Disease

## 2011-10-23 VITALS — BP 133/78 | HR 86 | Ht 70.0 in | Wt 185.0 lb

## 2011-10-23 DIAGNOSIS — I4891 Unspecified atrial fibrillation: Secondary | ICD-10-CM

## 2011-10-23 DIAGNOSIS — E785 Hyperlipidemia, unspecified: Secondary | ICD-10-CM

## 2011-10-23 DIAGNOSIS — R609 Edema, unspecified: Secondary | ICD-10-CM

## 2011-10-23 DIAGNOSIS — I10 Essential (primary) hypertension: Secondary | ICD-10-CM

## 2011-10-23 NOTE — Assessment & Plan Note (Signed)
Good rate control and anticoagulation.  INR;s q 5 weeks

## 2011-10-23 NOTE — Assessment & Plan Note (Signed)
Cholesterol is at goal.  Continue current dose of statin and diet Rx.  No myalgias or side effects.  F/U  LFT's in 6 months. Lab Results  Component Value Date   LDLCALC  Value: 104        Total Cholesterol/HDL:CHD Risk Coronary Heart Disease Risk Table                     Men   Women  1/2 Average Risk   3.4   3.3* 11/26/2007             

## 2011-10-23 NOTE — Assessment & Plan Note (Signed)
Secondary to varicosities.  Elevate legs  Has compression stockings  As needed diuretic

## 2011-10-23 NOTE — Assessment & Plan Note (Signed)
Well controlled.  Continue current medications and low sodium Dash type diet.    

## 2011-10-23 NOTE — Progress Notes (Signed)
Aaron Frye is seen today for f/U of HTN, chronic afib and coumadin therapy. He is doing well and continues to work daily at the [employer]. His coumadin dose has been decreased a bit. He has not had any bleeding porblems and sees the clinic monthly. His rate contol has been fine with no palpitations, SSCP or dyspnea. His EF has been in the 35-40% range. He has had no congestive symptoms. 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 He is taking crestor for his cholesterol.  Samples given  ROS: Denies fever, malais, weight loss, blurry vision, decreased visual acuity, cough, sputum, SOB, hemoptysis, pleuritic pain, palpitaitons, heartburn, abdominal pain, melena, lower extremity edema, claudication, or rash.  All other systems reviewed and negative  General: Affect appropriate Healthy:  appears stated age HEENT: normal Neck supple with no adenopathy JVP normal no bruits no thyromegaly Lungs clear with no wheezing and good diaphragmatic motion Heart:  S1/S2 no murmur, no rub, gallop or click PMI normal Abdomen: benighn, BS positve, no tenderness, no AAA no bruit.  No HSM or HJR Distal pulses intact with no bruits No edema Neuro non-focal Skin warm and dry No muscular weakness Chronic venous stasis with large varicose veins bilateral   Current Outpatient Prescriptions  Medication Sig Dispense Refill  . aspirin 81 MG tablet Take 81 mg by mouth daily.        . CRESTOR 20 MG tablet TAKE ONE TABLET BY MOUTH EVERY DAY  30 each  5  . hydrochlorothiazide (,MICROZIDE/HYDRODIURIL,) 12.5 MG capsule TAKE ONE CAPSULE BY MOUTH EVERY MORNING  30 capsule  12  . metoprolol succinate (TOPROL-XL) 100 MG 24 hr tablet 1/2 TAB DAILY ./ CY  15 tablet  6  . ramipril (ALTACE) 5 MG tablet Take 1 tablet (5 mg total) by mouth daily.  30 tablet  11  . warfarin (COUMADIN) 5 MG tablet Take as directed by Anticoagulation  clinic   90 tablet  1    Allergies  Penicillins  Electrocardiogram:  Assessment and Plan

## 2011-10-30 ENCOUNTER — Ambulatory Visit (INDEPENDENT_AMBULATORY_CARE_PROVIDER_SITE_OTHER): Payer: Medicare Other | Admitting: Pharmacist

## 2011-10-30 DIAGNOSIS — G459 Transient cerebral ischemic attack, unspecified: Secondary | ICD-10-CM

## 2011-10-30 DIAGNOSIS — Z7901 Long term (current) use of anticoagulants: Secondary | ICD-10-CM

## 2011-10-30 DIAGNOSIS — I6350 Cerebral infarction due to unspecified occlusion or stenosis of unspecified cerebral artery: Secondary | ICD-10-CM

## 2011-10-30 DIAGNOSIS — I4891 Unspecified atrial fibrillation: Secondary | ICD-10-CM

## 2011-10-30 LAB — POCT INR: INR: 2.2

## 2011-11-19 ENCOUNTER — Encounter (INDEPENDENT_AMBULATORY_CARE_PROVIDER_SITE_OTHER): Payer: Medicare Other | Admitting: Ophthalmology

## 2011-11-21 ENCOUNTER — Encounter (INDEPENDENT_AMBULATORY_CARE_PROVIDER_SITE_OTHER): Payer: Medicare Other | Admitting: Ophthalmology

## 2011-12-11 ENCOUNTER — Ambulatory Visit (INDEPENDENT_AMBULATORY_CARE_PROVIDER_SITE_OTHER): Payer: Medicare Other

## 2011-12-11 DIAGNOSIS — Z7901 Long term (current) use of anticoagulants: Secondary | ICD-10-CM

## 2011-12-11 DIAGNOSIS — G459 Transient cerebral ischemic attack, unspecified: Secondary | ICD-10-CM

## 2011-12-11 DIAGNOSIS — I6350 Cerebral infarction due to unspecified occlusion or stenosis of unspecified cerebral artery: Secondary | ICD-10-CM

## 2011-12-11 DIAGNOSIS — I4891 Unspecified atrial fibrillation: Secondary | ICD-10-CM

## 2011-12-11 LAB — POCT INR: INR: 2.5

## 2012-01-02 ENCOUNTER — Encounter (INDEPENDENT_AMBULATORY_CARE_PROVIDER_SITE_OTHER): Payer: Medicare Other | Admitting: Ophthalmology

## 2012-01-02 DIAGNOSIS — H2640 Unspecified secondary cataract: Secondary | ICD-10-CM

## 2012-01-02 DIAGNOSIS — H3530 Unspecified macular degeneration: Secondary | ICD-10-CM

## 2012-01-02 DIAGNOSIS — H35039 Hypertensive retinopathy, unspecified eye: Secondary | ICD-10-CM

## 2012-01-02 DIAGNOSIS — H43819 Vitreous degeneration, unspecified eye: Secondary | ICD-10-CM

## 2012-01-02 DIAGNOSIS — H353290 Exudative age-related macular degeneration, unspecified eye, stage unspecified: Secondary | ICD-10-CM

## 2012-01-02 DIAGNOSIS — H35379 Puckering of macula, unspecified eye: Secondary | ICD-10-CM

## 2012-01-02 DIAGNOSIS — I10 Essential (primary) hypertension: Secondary | ICD-10-CM

## 2012-01-16 ENCOUNTER — Encounter (INDEPENDENT_AMBULATORY_CARE_PROVIDER_SITE_OTHER): Payer: Medicare Other | Admitting: Ophthalmology

## 2012-01-22 ENCOUNTER — Ambulatory Visit (INDEPENDENT_AMBULATORY_CARE_PROVIDER_SITE_OTHER): Payer: Medicare Other | Admitting: *Deleted

## 2012-01-22 DIAGNOSIS — I6350 Cerebral infarction due to unspecified occlusion or stenosis of unspecified cerebral artery: Secondary | ICD-10-CM

## 2012-01-22 DIAGNOSIS — G459 Transient cerebral ischemic attack, unspecified: Secondary | ICD-10-CM

## 2012-01-22 DIAGNOSIS — Z7901 Long term (current) use of anticoagulants: Secondary | ICD-10-CM

## 2012-01-22 DIAGNOSIS — I4891 Unspecified atrial fibrillation: Secondary | ICD-10-CM

## 2012-01-22 LAB — POCT INR: INR: 2.5

## 2012-02-08 ENCOUNTER — Other Ambulatory Visit: Payer: Self-pay | Admitting: Cardiovascular Disease

## 2012-02-15 ENCOUNTER — Encounter (INDEPENDENT_AMBULATORY_CARE_PROVIDER_SITE_OTHER): Payer: Medicare Other | Admitting: Ophthalmology

## 2012-02-19 ENCOUNTER — Encounter (INDEPENDENT_AMBULATORY_CARE_PROVIDER_SITE_OTHER): Payer: Medicare Other | Admitting: Ophthalmology

## 2012-02-20 ENCOUNTER — Encounter (INDEPENDENT_AMBULATORY_CARE_PROVIDER_SITE_OTHER): Payer: Medicare Other | Admitting: Ophthalmology

## 2012-02-20 DIAGNOSIS — H353290 Exudative age-related macular degeneration, unspecified eye, stage unspecified: Secondary | ICD-10-CM

## 2012-02-20 DIAGNOSIS — H3530 Unspecified macular degeneration: Secondary | ICD-10-CM

## 2012-02-20 DIAGNOSIS — H43819 Vitreous degeneration, unspecified eye: Secondary | ICD-10-CM

## 2012-02-20 DIAGNOSIS — H35379 Puckering of macula, unspecified eye: Secondary | ICD-10-CM

## 2012-02-20 DIAGNOSIS — I10 Essential (primary) hypertension: Secondary | ICD-10-CM

## 2012-02-20 DIAGNOSIS — H35039 Hypertensive retinopathy, unspecified eye: Secondary | ICD-10-CM

## 2012-03-04 ENCOUNTER — Ambulatory Visit (INDEPENDENT_AMBULATORY_CARE_PROVIDER_SITE_OTHER): Payer: Medicare Other | Admitting: *Deleted

## 2012-03-04 DIAGNOSIS — Z7901 Long term (current) use of anticoagulants: Secondary | ICD-10-CM

## 2012-03-04 DIAGNOSIS — G459 Transient cerebral ischemic attack, unspecified: Secondary | ICD-10-CM

## 2012-03-04 DIAGNOSIS — I6350 Cerebral infarction due to unspecified occlusion or stenosis of unspecified cerebral artery: Secondary | ICD-10-CM

## 2012-03-04 DIAGNOSIS — I4891 Unspecified atrial fibrillation: Secondary | ICD-10-CM

## 2012-03-04 LAB — POCT INR: INR: 1.8

## 2012-03-16 ENCOUNTER — Other Ambulatory Visit: Payer: Self-pay | Admitting: Cardiovascular Disease

## 2012-04-01 ENCOUNTER — Ambulatory Visit (INDEPENDENT_AMBULATORY_CARE_PROVIDER_SITE_OTHER): Payer: Medicare Other | Admitting: *Deleted

## 2012-04-01 DIAGNOSIS — G459 Transient cerebral ischemic attack, unspecified: Secondary | ICD-10-CM

## 2012-04-01 DIAGNOSIS — I4891 Unspecified atrial fibrillation: Secondary | ICD-10-CM

## 2012-04-01 DIAGNOSIS — I6350 Cerebral infarction due to unspecified occlusion or stenosis of unspecified cerebral artery: Secondary | ICD-10-CM

## 2012-04-01 DIAGNOSIS — Z7901 Long term (current) use of anticoagulants: Secondary | ICD-10-CM

## 2012-04-01 LAB — POCT INR: INR: 2.7

## 2012-04-09 ENCOUNTER — Encounter (INDEPENDENT_AMBULATORY_CARE_PROVIDER_SITE_OTHER): Payer: Medicare Other | Admitting: Ophthalmology

## 2012-04-09 DIAGNOSIS — H43819 Vitreous degeneration, unspecified eye: Secondary | ICD-10-CM

## 2012-04-09 DIAGNOSIS — H3530 Unspecified macular degeneration: Secondary | ICD-10-CM

## 2012-04-09 DIAGNOSIS — H35039 Hypertensive retinopathy, unspecified eye: Secondary | ICD-10-CM

## 2012-04-09 DIAGNOSIS — H353290 Exudative age-related macular degeneration, unspecified eye, stage unspecified: Secondary | ICD-10-CM

## 2012-04-09 DIAGNOSIS — I10 Essential (primary) hypertension: Secondary | ICD-10-CM

## 2012-04-29 ENCOUNTER — Ambulatory Visit (INDEPENDENT_AMBULATORY_CARE_PROVIDER_SITE_OTHER): Payer: Medicare Other

## 2012-04-29 DIAGNOSIS — I6350 Cerebral infarction due to unspecified occlusion or stenosis of unspecified cerebral artery: Secondary | ICD-10-CM

## 2012-04-29 DIAGNOSIS — G459 Transient cerebral ischemic attack, unspecified: Secondary | ICD-10-CM

## 2012-04-29 DIAGNOSIS — Z7901 Long term (current) use of anticoagulants: Secondary | ICD-10-CM

## 2012-04-29 DIAGNOSIS — I4891 Unspecified atrial fibrillation: Secondary | ICD-10-CM

## 2012-05-21 ENCOUNTER — Encounter: Payer: Self-pay | Admitting: Cardiovascular Disease

## 2012-05-21 ENCOUNTER — Ambulatory Visit (INDEPENDENT_AMBULATORY_CARE_PROVIDER_SITE_OTHER): Payer: Medicare Other | Admitting: Cardiovascular Disease

## 2012-05-21 VITALS — BP 146/77 | HR 79 | Ht 70.0 in | Wt 184.0 lb

## 2012-05-21 DIAGNOSIS — I10 Essential (primary) hypertension: Secondary | ICD-10-CM

## 2012-05-21 DIAGNOSIS — I4891 Unspecified atrial fibrillation: Secondary | ICD-10-CM

## 2012-05-21 DIAGNOSIS — E785 Hyperlipidemia, unspecified: Secondary | ICD-10-CM

## 2012-05-21 MED ORDER — ATORVASTATIN CALCIUM 40 MG PO TABS: 40.0000 mg | ORAL_TABLET | Freq: Every day | ORAL | Status: DC

## 2012-05-21 NOTE — Assessment & Plan Note (Signed)
Well controlled.  Continue current medications and low sodium Dash type diet.    

## 2012-05-21 NOTE — Assessment & Plan Note (Signed)
Good rate control and anticoagulation  

## 2012-05-21 NOTE — Progress Notes (Signed)
Patient ID: Aaron Frye, male   DOB: 1925-06-06, 78 y.o.   MRN: [id number] Aaron Frye is seen today for f/U of HTN, chronic afib and coumadin therapy. He is doing well and continues to work daily at the [employer]. His coumadin dose has been decreased a bit. He has not had any bleeding porblems and sees the clinic monthly. His rate contol has been fine with no palpitations, SSCP or dyspnea. His EF has been in the 35-40% range. He has had no congestive symptoms. 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 He is taking crestor for his cholesterol. Samples given  Will change to generic lipitor to save money   ROS: Denies fever, malais, weight loss, blurry vision, decreased visual acuity, cough, sputum, SOB, hemoptysis, pleuritic pain, palpitaitons, heartburn, abdominal pain, melena, lower extremity edema, claudication, or rash.  All other systems reviewed and negative  General: Affect appropriate Healthy:  appears stated age HEENT: normal Neck supple with no adenopathy JVP normal no bruits no thyromegaly Lungs clear with no wheezing and good diaphragmatic motion Heart:  S1/S2 no murmur, no rub, gallop or click PMI normal Abdomen: benighn, BS positve, no tenderness, no AAA no bruit.  No HSM or HJR Distal pulses intact with no bruits No edema Neuro non-focal Skin warm and dry No muscular weakness   Current Outpatient Prescriptions  Medication Sig Dispense Refill  . aspirin 81 MG tablet Take 81 mg by mouth daily.        . CRESTOR 20 MG tablet TAKE ONE TABLET BY MOUTH EVERY DAY  30 each  5  . hydrochlorothiazide (MICROZIDE) 12.5 MG capsule TAKE ONE CAPSULE BY MOUTH IN THE MORNING  30 capsule  11  . metoprolol succinate (TOPROL-XL) 100 MG 24 hr tablet 1/2 TAB DAILY ./ CY  15 tablet  6  . ramipril (ALTACE) 5 MG tablet Take 1 tablet (5 mg total) by mouth daily.  30 tablet  11  . warfarin  (COUMADIN) 5 MG tablet TAKE AS DIRECTED BY ANTICOAGULATION CLINIC  90 tablet  1    Allergies  Penicillins  Electrocardiogram:  10/12  Afib rate 82 otherwise normal  Assessment and Plan

## 2012-05-21 NOTE — Patient Instructions (Addendum)
Your physician wants you to follow-up in: 6  MONTHS  WITH DR Haywood Filler will receive a reminder letter in the mail two months in advance. If you don't receive a letter, please call our office to schedule the follow-up appointment. Your physician has recommended you make the following change in your medication: ATORVASTATIN  40 MG  EVERY DAY  Your physician recommends that you return for lab work in:  IN 6 MONTHS  LIPID LIVER

## 2012-05-21 NOTE — Assessment & Plan Note (Signed)
Change to generic lipitor labs in 6 months

## 2012-05-23 ENCOUNTER — Encounter (INDEPENDENT_AMBULATORY_CARE_PROVIDER_SITE_OTHER): Payer: Medicare Other | Admitting: Ophthalmology

## 2012-05-23 DIAGNOSIS — I10 Essential (primary) hypertension: Secondary | ICD-10-CM

## 2012-05-23 DIAGNOSIS — H35039 Hypertensive retinopathy, unspecified eye: Secondary | ICD-10-CM

## 2012-05-23 DIAGNOSIS — H43819 Vitreous degeneration, unspecified eye: Secondary | ICD-10-CM

## 2012-05-23 DIAGNOSIS — H353290 Exudative age-related macular degeneration, unspecified eye, stage unspecified: Secondary | ICD-10-CM

## 2012-05-23 DIAGNOSIS — H3530 Unspecified macular degeneration: Secondary | ICD-10-CM

## 2012-06-03 ENCOUNTER — Ambulatory Visit (INDEPENDENT_AMBULATORY_CARE_PROVIDER_SITE_OTHER): Payer: Medicare Other

## 2012-06-03 DIAGNOSIS — I4891 Unspecified atrial fibrillation: Secondary | ICD-10-CM

## 2012-06-03 DIAGNOSIS — Z7901 Long term (current) use of anticoagulants: Secondary | ICD-10-CM

## 2012-06-03 DIAGNOSIS — G459 Transient cerebral ischemic attack, unspecified: Secondary | ICD-10-CM

## 2012-06-03 DIAGNOSIS — I6350 Cerebral infarction due to unspecified occlusion or stenosis of unspecified cerebral artery: Secondary | ICD-10-CM

## 2012-07-10 ENCOUNTER — Encounter (INDEPENDENT_AMBULATORY_CARE_PROVIDER_SITE_OTHER): Payer: Medicare Other | Admitting: Ophthalmology

## 2012-07-10 DIAGNOSIS — H35039 Hypertensive retinopathy, unspecified eye: Secondary | ICD-10-CM

## 2012-07-10 DIAGNOSIS — H353290 Exudative age-related macular degeneration, unspecified eye, stage unspecified: Secondary | ICD-10-CM

## 2012-07-10 DIAGNOSIS — H43819 Vitreous degeneration, unspecified eye: Secondary | ICD-10-CM

## 2012-07-10 DIAGNOSIS — I10 Essential (primary) hypertension: Secondary | ICD-10-CM

## 2012-07-10 DIAGNOSIS — H3530 Unspecified macular degeneration: Secondary | ICD-10-CM

## 2012-07-15 ENCOUNTER — Ambulatory Visit (INDEPENDENT_AMBULATORY_CARE_PROVIDER_SITE_OTHER): Payer: Medicare Other

## 2012-07-15 DIAGNOSIS — I4891 Unspecified atrial fibrillation: Secondary | ICD-10-CM

## 2012-07-15 DIAGNOSIS — Z7901 Long term (current) use of anticoagulants: Secondary | ICD-10-CM

## 2012-07-15 DIAGNOSIS — G459 Transient cerebral ischemic attack, unspecified: Secondary | ICD-10-CM

## 2012-07-15 DIAGNOSIS — I6350 Cerebral infarction due to unspecified occlusion or stenosis of unspecified cerebral artery: Secondary | ICD-10-CM

## 2012-07-31 ENCOUNTER — Other Ambulatory Visit: Payer: Self-pay

## 2012-07-31 MED ORDER — RAMIPRIL 5 MG PO TABS: 5.0000 mg | ORAL_TABLET | Freq: Every day | ORAL | Status: DC

## 2012-08-19 ENCOUNTER — Other Ambulatory Visit: Payer: Self-pay | Admitting: *Deleted

## 2012-08-19 MED ORDER — WARFARIN SODIUM 5 MG PO TABS: 5.0000 mg | ORAL_TABLET | ORAL | Status: DC

## 2012-08-26 ENCOUNTER — Ambulatory Visit (INDEPENDENT_AMBULATORY_CARE_PROVIDER_SITE_OTHER): Payer: Medicare Other | Admitting: *Deleted

## 2012-08-26 DIAGNOSIS — G459 Transient cerebral ischemic attack, unspecified: Secondary | ICD-10-CM

## 2012-08-26 DIAGNOSIS — I4891 Unspecified atrial fibrillation: Secondary | ICD-10-CM

## 2012-08-26 DIAGNOSIS — I6350 Cerebral infarction due to unspecified occlusion or stenosis of unspecified cerebral artery: Secondary | ICD-10-CM

## 2012-08-26 DIAGNOSIS — Z7901 Long term (current) use of anticoagulants: Secondary | ICD-10-CM

## 2012-08-26 LAB — POCT INR: INR: 2.1

## 2012-08-27 ENCOUNTER — Encounter (INDEPENDENT_AMBULATORY_CARE_PROVIDER_SITE_OTHER): Payer: Medicare Other | Admitting: Ophthalmology

## 2012-08-27 DIAGNOSIS — I10 Essential (primary) hypertension: Secondary | ICD-10-CM

## 2012-08-27 DIAGNOSIS — H353290 Exudative age-related macular degeneration, unspecified eye, stage unspecified: Secondary | ICD-10-CM

## 2012-08-27 DIAGNOSIS — H3530 Unspecified macular degeneration: Secondary | ICD-10-CM

## 2012-08-27 DIAGNOSIS — H35039 Hypertensive retinopathy, unspecified eye: Secondary | ICD-10-CM

## 2012-08-27 DIAGNOSIS — H43819 Vitreous degeneration, unspecified eye: Secondary | ICD-10-CM

## 2012-09-12 ENCOUNTER — Other Ambulatory Visit: Payer: Self-pay | Admitting: *Deleted

## 2012-09-12 MED ORDER — METOPROLOL SUCCINATE ER 100 MG PO TB24: ORAL_TABLET | ORAL | Status: DC

## 2012-10-07 ENCOUNTER — Ambulatory Visit (INDEPENDENT_AMBULATORY_CARE_PROVIDER_SITE_OTHER): Payer: Medicare Other

## 2012-10-07 DIAGNOSIS — Z7901 Long term (current) use of anticoagulants: Secondary | ICD-10-CM

## 2012-10-07 DIAGNOSIS — G459 Transient cerebral ischemic attack, unspecified: Secondary | ICD-10-CM

## 2012-10-07 DIAGNOSIS — I6350 Cerebral infarction due to unspecified occlusion or stenosis of unspecified cerebral artery: Secondary | ICD-10-CM

## 2012-10-07 DIAGNOSIS — I4891 Unspecified atrial fibrillation: Secondary | ICD-10-CM

## 2012-10-22 ENCOUNTER — Encounter (INDEPENDENT_AMBULATORY_CARE_PROVIDER_SITE_OTHER): Payer: Medicare Other | Admitting: Ophthalmology

## 2012-10-22 DIAGNOSIS — H353290 Exudative age-related macular degeneration, unspecified eye, stage unspecified: Secondary | ICD-10-CM

## 2012-10-22 DIAGNOSIS — H3530 Unspecified macular degeneration: Secondary | ICD-10-CM

## 2012-10-22 DIAGNOSIS — I10 Essential (primary) hypertension: Secondary | ICD-10-CM

## 2012-10-22 DIAGNOSIS — H43819 Vitreous degeneration, unspecified eye: Secondary | ICD-10-CM

## 2012-10-22 DIAGNOSIS — H35039 Hypertensive retinopathy, unspecified eye: Secondary | ICD-10-CM

## 2012-11-18 ENCOUNTER — Encounter: Payer: Self-pay | Admitting: Cardiovascular Disease

## 2012-11-18 ENCOUNTER — Ambulatory Visit (INDEPENDENT_AMBULATORY_CARE_PROVIDER_SITE_OTHER): Payer: Medicare Other

## 2012-11-18 ENCOUNTER — Ambulatory Visit (INDEPENDENT_AMBULATORY_CARE_PROVIDER_SITE_OTHER): Payer: Medicare Other | Admitting: Cardiovascular Disease

## 2012-11-18 VITALS — BP 125/70 | HR 80 | Ht 70.0 in | Wt 178.4 lb

## 2012-11-18 DIAGNOSIS — G459 Transient cerebral ischemic attack, unspecified: Secondary | ICD-10-CM

## 2012-11-18 DIAGNOSIS — I6350 Cerebral infarction due to unspecified occlusion or stenosis of unspecified cerebral artery: Secondary | ICD-10-CM

## 2012-11-18 DIAGNOSIS — I4891 Unspecified atrial fibrillation: Secondary | ICD-10-CM

## 2012-11-18 DIAGNOSIS — Z7901 Long term (current) use of anticoagulants: Secondary | ICD-10-CM

## 2012-11-18 DIAGNOSIS — I10 Essential (primary) hypertension: Secondary | ICD-10-CM

## 2012-11-18 DIAGNOSIS — E785 Hyperlipidemia, unspecified: Secondary | ICD-10-CM

## 2012-11-18 LAB — POCT INR: INR: 2

## 2012-11-18 NOTE — Assessment & Plan Note (Signed)
Well controlled.  Continue current medications and low sodium Dash type diet.    

## 2012-11-18 NOTE — Assessment & Plan Note (Signed)
Good rate control and anticoagulation with no bleeding

## 2012-11-18 NOTE — Progress Notes (Signed)
Patient ID: Aaron Frye, male   DOB: 08/18/1924, 79 y.o.   MRN: [id number] [place] is seen today for f/U of HTN, chronic afib and coumadin therapy. He is doing well and continues to work daily at the [employer]. His coumadin dose has been decreased a bit. He has not had any bleeding porblems and sees the clinic monthly. His rate contol has been fine with no palpitations, SSCP or dyspnea. His EF has been in the 35-40% range. He has had no congestive symptoms. 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 He is taking crestor for his cholesterol. Samples given  Still working needs new forms for handicap parking  ROS: Denies fever, malais, weight loss, blurry vision, decreased visual acuity, cough, sputum, SOB, hemoptysis, pleuritic pain, palpitaitons, heartburn, abdominal pain, melena, lower extremity edema, claudication, or rash.  All other systems reviewed and negative  General: Affect appropriate Healthy:  appears stated age HEENT: normal Neck supple with no adenopathy JVP normal no bruits no thyromegaly Lungs clear with no wheezing and good diaphragmatic motion Heart:  S1/S2 no murmur, no rub, gallop or click PMI normal Abdomen: benighn, BS positve, no tenderness, no AAA no bruit.  No HSM or HJR Distal pulses intact with no bruits No edema Neuro non-focal Skin warm and dry No muscular weakness   Current Outpatient Prescriptions  Medication Sig Dispense Refill  . aspirin 81 MG tablet Take 81 mg by mouth daily.        [name] atorvastatin (LIPITOR) 40 MG tablet Take 1 tablet (40 mg total) by mouth daily.  90 tablet  3  . hydrochlorothiazide (MICROZIDE) 12.5 MG capsule TAKE ONE CAPSULE BY MOUTH IN THE MORNING  30 capsule  11  . metoprolol succinate (TOPROL-XL) 100 MG 24 hr tablet 1/2 TAB DAILY ./ CY  15 tablet  11  . ramipril (ALTACE) 5 MG tablet Take 1 tablet (5 mg total) by mouth daily.   30 tablet  11  . warfarin (COUMADIN) 5 MG tablet Take 1 tablet (5 mg total) by mouth as directed.  90 tablet  1   No current facility-administered medications for this visit.    Allergies  Penicillins  Electrocardiogram:  afib rate 78 otherwise normal  Assessment and Plan

## 2012-11-18 NOTE — Assessment & Plan Note (Signed)
Cholesterol is at goal.  Continue current dose of statin and diet Rx.  No myalgias or side effects.  F/U  LFT's in 6 months. Lab Results  Component Value Date   LDLCALC  Value: 104        Total Cholesterol/HDL:CHD Risk Coronary Heart Disease Risk Table                     Men   Women  1/2 Average Risk   3.4   3.3* 11/26/2007             

## 2012-11-18 NOTE — Patient Instructions (Addendum)
Your physician wants you to follow-up in:  6 MONTHS WITH DR NISHAN  You will receive a reminder letter in the mail two months in advance. If you don't receive a letter, please call our office to schedule the follow-up appointment. Your physician recommends that you continue on your current medications as directed. Please refer to the Current Medication list given to you today. 

## 2012-12-03 ENCOUNTER — Encounter (INDEPENDENT_AMBULATORY_CARE_PROVIDER_SITE_OTHER): Payer: Medicare Other | Admitting: Ophthalmology

## 2012-12-03 DIAGNOSIS — H3530 Unspecified macular degeneration: Secondary | ICD-10-CM

## 2012-12-03 DIAGNOSIS — H35039 Hypertensive retinopathy, unspecified eye: Secondary | ICD-10-CM

## 2012-12-03 DIAGNOSIS — H43819 Vitreous degeneration, unspecified eye: Secondary | ICD-10-CM

## 2012-12-03 DIAGNOSIS — I10 Essential (primary) hypertension: Secondary | ICD-10-CM

## 2012-12-03 DIAGNOSIS — H353290 Exudative age-related macular degeneration, unspecified eye, stage unspecified: Secondary | ICD-10-CM

## 2012-12-30 ENCOUNTER — Ambulatory Visit (INDEPENDENT_AMBULATORY_CARE_PROVIDER_SITE_OTHER): Payer: Medicare Other | Admitting: *Deleted

## 2012-12-30 DIAGNOSIS — I4891 Unspecified atrial fibrillation: Secondary | ICD-10-CM

## 2012-12-30 DIAGNOSIS — Z7901 Long term (current) use of anticoagulants: Secondary | ICD-10-CM

## 2012-12-30 DIAGNOSIS — G459 Transient cerebral ischemic attack, unspecified: Secondary | ICD-10-CM

## 2012-12-30 DIAGNOSIS — I6350 Cerebral infarction due to unspecified occlusion or stenosis of unspecified cerebral artery: Secondary | ICD-10-CM

## 2012-12-30 LAB — POCT INR: INR: 1.9

## 2013-01-14 ENCOUNTER — Encounter (INDEPENDENT_AMBULATORY_CARE_PROVIDER_SITE_OTHER): Payer: Medicare Other | Admitting: Ophthalmology

## 2013-01-14 DIAGNOSIS — H3530 Unspecified macular degeneration: Secondary | ICD-10-CM

## 2013-01-14 DIAGNOSIS — H35039 Hypertensive retinopathy, unspecified eye: Secondary | ICD-10-CM

## 2013-01-14 DIAGNOSIS — H43819 Vitreous degeneration, unspecified eye: Secondary | ICD-10-CM

## 2013-01-14 DIAGNOSIS — I10 Essential (primary) hypertension: Secondary | ICD-10-CM

## 2013-01-14 DIAGNOSIS — H35379 Puckering of macula, unspecified eye: Secondary | ICD-10-CM

## 2013-01-14 DIAGNOSIS — H353290 Exudative age-related macular degeneration, unspecified eye, stage unspecified: Secondary | ICD-10-CM

## 2013-02-10 ENCOUNTER — Ambulatory Visit (INDEPENDENT_AMBULATORY_CARE_PROVIDER_SITE_OTHER): Payer: Medicare Other | Admitting: *Deleted

## 2013-02-10 DIAGNOSIS — I4891 Unspecified atrial fibrillation: Secondary | ICD-10-CM

## 2013-02-10 DIAGNOSIS — G459 Transient cerebral ischemic attack, unspecified: Secondary | ICD-10-CM

## 2013-02-10 DIAGNOSIS — Z7901 Long term (current) use of anticoagulants: Secondary | ICD-10-CM

## 2013-02-10 DIAGNOSIS — I6350 Cerebral infarction due to unspecified occlusion or stenosis of unspecified cerebral artery: Secondary | ICD-10-CM

## 2013-02-10 LAB — POCT INR: INR: 2

## 2013-02-23 ENCOUNTER — Other Ambulatory Visit: Payer: Self-pay | Admitting: Cardiovascular Disease

## 2013-02-24 ENCOUNTER — Other Ambulatory Visit: Payer: Self-pay | Admitting: Cardiovascular Disease

## 2013-02-25 ENCOUNTER — Encounter (INDEPENDENT_AMBULATORY_CARE_PROVIDER_SITE_OTHER): Payer: Medicare Other | Admitting: Ophthalmology

## 2013-02-25 DIAGNOSIS — I10 Essential (primary) hypertension: Secondary | ICD-10-CM

## 2013-02-25 DIAGNOSIS — H3530 Unspecified macular degeneration: Secondary | ICD-10-CM

## 2013-02-25 DIAGNOSIS — H43819 Vitreous degeneration, unspecified eye: Secondary | ICD-10-CM

## 2013-02-25 DIAGNOSIS — H353290 Exudative age-related macular degeneration, unspecified eye, stage unspecified: Secondary | ICD-10-CM

## 2013-02-25 DIAGNOSIS — H35039 Hypertensive retinopathy, unspecified eye: Secondary | ICD-10-CM

## 2013-03-18 ENCOUNTER — Other Ambulatory Visit: Payer: Self-pay | Admitting: Cardiovascular Disease

## 2013-03-25 ENCOUNTER — Ambulatory Visit (INDEPENDENT_AMBULATORY_CARE_PROVIDER_SITE_OTHER): Payer: Medicare Other | Admitting: *Deleted

## 2013-03-25 DIAGNOSIS — I4891 Unspecified atrial fibrillation: Secondary | ICD-10-CM

## 2013-03-25 DIAGNOSIS — Z7901 Long term (current) use of anticoagulants: Secondary | ICD-10-CM

## 2013-03-25 DIAGNOSIS — I6350 Cerebral infarction due to unspecified occlusion or stenosis of unspecified cerebral artery: Secondary | ICD-10-CM

## 2013-03-25 DIAGNOSIS — G459 Transient cerebral ischemic attack, unspecified: Secondary | ICD-10-CM

## 2013-03-25 LAB — POCT INR: INR: 2.1

## 2013-04-08 ENCOUNTER — Encounter (INDEPENDENT_AMBULATORY_CARE_PROVIDER_SITE_OTHER): Payer: Medicare Other | Admitting: Ophthalmology

## 2013-04-08 DIAGNOSIS — H3530 Unspecified macular degeneration: Secondary | ICD-10-CM

## 2013-04-08 DIAGNOSIS — H353290 Exudative age-related macular degeneration, unspecified eye, stage unspecified: Secondary | ICD-10-CM

## 2013-04-08 DIAGNOSIS — I10 Essential (primary) hypertension: Secondary | ICD-10-CM

## 2013-04-08 DIAGNOSIS — H35039 Hypertensive retinopathy, unspecified eye: Secondary | ICD-10-CM

## 2013-04-08 DIAGNOSIS — H43819 Vitreous degeneration, unspecified eye: Secondary | ICD-10-CM

## 2013-05-05 ENCOUNTER — Ambulatory Visit (INDEPENDENT_AMBULATORY_CARE_PROVIDER_SITE_OTHER): Payer: Medicare Other | Admitting: General Practice

## 2013-05-05 DIAGNOSIS — G459 Transient cerebral ischemic attack, unspecified: Secondary | ICD-10-CM

## 2013-05-05 DIAGNOSIS — I4891 Unspecified atrial fibrillation: Secondary | ICD-10-CM

## 2013-05-05 DIAGNOSIS — Z7901 Long term (current) use of anticoagulants: Secondary | ICD-10-CM

## 2013-05-05 DIAGNOSIS — I6350 Cerebral infarction due to unspecified occlusion or stenosis of unspecified cerebral artery: Secondary | ICD-10-CM

## 2013-05-05 LAB — POCT INR: INR: 1.8

## 2013-05-20 ENCOUNTER — Encounter (INDEPENDENT_AMBULATORY_CARE_PROVIDER_SITE_OTHER): Payer: Medicare Other | Admitting: Ophthalmology

## 2013-05-20 ENCOUNTER — Ambulatory Visit: Payer: Medicare Other | Admitting: Cardiovascular Disease

## 2013-05-20 DIAGNOSIS — H43819 Vitreous degeneration, unspecified eye: Secondary | ICD-10-CM

## 2013-05-20 DIAGNOSIS — H35039 Hypertensive retinopathy, unspecified eye: Secondary | ICD-10-CM

## 2013-05-20 DIAGNOSIS — H3530 Unspecified macular degeneration: Secondary | ICD-10-CM

## 2013-05-20 DIAGNOSIS — I10 Essential (primary) hypertension: Secondary | ICD-10-CM

## 2013-05-20 DIAGNOSIS — H353290 Exudative age-related macular degeneration, unspecified eye, stage unspecified: Secondary | ICD-10-CM

## 2013-05-21 ENCOUNTER — Other Ambulatory Visit: Payer: Self-pay | Admitting: Cardiovascular Disease

## 2013-06-02 ENCOUNTER — Ambulatory Visit (INDEPENDENT_AMBULATORY_CARE_PROVIDER_SITE_OTHER): Payer: Medicare Other | Admitting: General Practice

## 2013-06-02 DIAGNOSIS — I6350 Cerebral infarction due to unspecified occlusion or stenosis of unspecified cerebral artery: Secondary | ICD-10-CM

## 2013-06-02 DIAGNOSIS — I4891 Unspecified atrial fibrillation: Secondary | ICD-10-CM

## 2013-06-02 DIAGNOSIS — Z7901 Long term (current) use of anticoagulants: Secondary | ICD-10-CM

## 2013-06-02 DIAGNOSIS — G459 Transient cerebral ischemic attack, unspecified: Secondary | ICD-10-CM

## 2013-06-02 LAB — POCT INR: INR: 2

## 2013-06-12 ENCOUNTER — Ambulatory Visit (INDEPENDENT_AMBULATORY_CARE_PROVIDER_SITE_OTHER): Payer: Medicare Other | Admitting: Cardiovascular Disease

## 2013-06-12 VITALS — BP 142/74 | HR 76 | Ht 67.0 in | Wt 181.0 lb

## 2013-06-12 DIAGNOSIS — I4891 Unspecified atrial fibrillation: Secondary | ICD-10-CM

## 2013-06-12 DIAGNOSIS — E785 Hyperlipidemia, unspecified: Secondary | ICD-10-CM

## 2013-06-12 DIAGNOSIS — I10 Essential (primary) hypertension: Secondary | ICD-10-CM

## 2013-06-12 NOTE — Patient Instructions (Addendum)
Your physician wants you to follow-up in:  6 MONTHS WITH DR NISHAN  You will receive a reminder letter in the mail two months in advance. If you don't receive a letter, please call our office to schedule the follow-up appointment. Your physician recommends that you continue on your current medications as directed. Please refer to the Current Medication list given to you today. 

## 2013-06-12 NOTE — Assessment & Plan Note (Signed)
Good rate control and anticoagulation f/u coumadin clinic

## 2013-06-12 NOTE — Assessment & Plan Note (Signed)
Cholesterol is at goal.  Continue current dose of statin and diet Rx.  No myalgias or side effects.  F/U  LFT's in 6 months. Lab Results  Component Value Date   LDLCALC  Value: 104        Total Cholesterol/HDL:CHD Risk Coronary Heart Disease Risk Table                     Men   Women  1/2 Average Risk   3.4   3.3* 11/26/2007             

## 2013-06-12 NOTE — Assessment & Plan Note (Signed)
Well controlled.  Continue current medications and low sodium Dash type diet.    

## 2013-06-12 NOTE — Progress Notes (Signed)
Patient ID: Aaron Frye, male   DOB: 1925/04/08, 79 y.o.   MRN: [id number] Aaron Frye is seen today for f/U of HTN, chronic afib and coumadin therapy. He is doing well and continues to work daily at the [employer]. His coumadin dose has been decreased a bit. He has not had any bleeding porblems and sees the clinic monthly. His rate contol has been fine with no palpitations, SSCP or dyspnea. His EF has been in the 35-40% range. He has had no congestive symptoms. 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 He is taking crestor for his cholesterol. Samples given  Still working needs new forms for handicap parking  INR has been labile and a bit low Diet has not varied        ROS: Denies fever, malais, weight loss, blurry vision, decreased visual acuity, cough, sputum, SOB, hemoptysis, pleuritic pain, palpitaitons, heartburn, abdominal pain, melena, lower extremity edema, claudication, or rash.  All other systems reviewed and negative  General: Affect appropriate Healthy:  appears stated age HEENT: normal Neck supple with no adenopathy JVP normal no bruits no thyromegaly Lungs clear with no wheezing and good diaphragmatic motion Heart:  S1/S2 no murmur, no rub, gallop or click PMI normal Abdomen: benighn, BS positve, no tenderness, no AAA no bruit.  No HSM or HJR Distal pulses intact with no bruits No edema Neuro non-focal Skin warm and dry No muscular weakness   Current Outpatient Prescriptions  Medication Sig Dispense Refill  . aspirin 81 MG tablet Take 81 mg by mouth daily.        [name] atorvastatin (LIPITOR) 40 MG tablet Take 1 tablet (40 mg total) by mouth daily.  90 tablet  3  . hydrochlorothiazide (MICROZIDE) 12.5 MG capsule TAKE ONE CAPSULE BY MOUTH IN THE MORNING  30 capsule  3  . metoprolol succinate (TOPROL-XL) 100 MG 24 hr tablet 1/2 TAB DAILY ./ CY  15 tablet  11  . ramipril  (ALTACE) 5 MG tablet Take 1 tablet (5 mg total) by mouth daily.  30 tablet  11  . warfarin (COUMADIN) 5 MG tablet TAKE ONE TABLET BY MOUTH AS DIRECTED  100 tablet  1   No current facility-administered medications for this visit.    Allergies  Penicillins  Electrocardiogram:  afib nonspecfici ST T wave changes   Assessment and Plan

## 2013-06-30 ENCOUNTER — Ambulatory Visit (INDEPENDENT_AMBULATORY_CARE_PROVIDER_SITE_OTHER): Payer: Medicare Other | Admitting: Pharmacist

## 2013-06-30 DIAGNOSIS — Z7901 Long term (current) use of anticoagulants: Secondary | ICD-10-CM

## 2013-06-30 DIAGNOSIS — G459 Transient cerebral ischemic attack, unspecified: Secondary | ICD-10-CM

## 2013-06-30 DIAGNOSIS — I6350 Cerebral infarction due to unspecified occlusion or stenosis of unspecified cerebral artery: Secondary | ICD-10-CM

## 2013-06-30 DIAGNOSIS — I4891 Unspecified atrial fibrillation: Secondary | ICD-10-CM

## 2013-07-01 ENCOUNTER — Encounter (INDEPENDENT_AMBULATORY_CARE_PROVIDER_SITE_OTHER): Payer: Medicare Other | Admitting: Ophthalmology

## 2013-07-01 DIAGNOSIS — H35379 Puckering of macula, unspecified eye: Secondary | ICD-10-CM

## 2013-07-01 DIAGNOSIS — H35039 Hypertensive retinopathy, unspecified eye: Secondary | ICD-10-CM

## 2013-07-01 DIAGNOSIS — I10 Essential (primary) hypertension: Secondary | ICD-10-CM

## 2013-07-01 DIAGNOSIS — H3530 Unspecified macular degeneration: Secondary | ICD-10-CM

## 2013-07-01 DIAGNOSIS — H43819 Vitreous degeneration, unspecified eye: Secondary | ICD-10-CM

## 2013-07-01 DIAGNOSIS — H353290 Exudative age-related macular degeneration, unspecified eye, stage unspecified: Secondary | ICD-10-CM

## 2013-07-21 ENCOUNTER — Other Ambulatory Visit: Payer: Self-pay | Admitting: Cardiovascular Disease

## 2013-07-28 ENCOUNTER — Other Ambulatory Visit: Payer: Self-pay | Admitting: Cardiovascular Disease

## 2013-07-28 ENCOUNTER — Ambulatory Visit (INDEPENDENT_AMBULATORY_CARE_PROVIDER_SITE_OTHER): Payer: Medicare Other | Admitting: Pharmacist

## 2013-07-28 DIAGNOSIS — G459 Transient cerebral ischemic attack, unspecified: Secondary | ICD-10-CM

## 2013-07-28 DIAGNOSIS — I6350 Cerebral infarction due to unspecified occlusion or stenosis of unspecified cerebral artery: Secondary | ICD-10-CM

## 2013-07-28 DIAGNOSIS — Z7901 Long term (current) use of anticoagulants: Secondary | ICD-10-CM

## 2013-07-28 DIAGNOSIS — I4891 Unspecified atrial fibrillation: Secondary | ICD-10-CM

## 2013-07-28 LAB — POCT INR: INR: 2.8

## 2013-08-12 ENCOUNTER — Encounter (INDEPENDENT_AMBULATORY_CARE_PROVIDER_SITE_OTHER): Payer: Medicare Other | Admitting: Ophthalmology

## 2013-08-12 DIAGNOSIS — I10 Essential (primary) hypertension: Secondary | ICD-10-CM

## 2013-08-12 DIAGNOSIS — H353290 Exudative age-related macular degeneration, unspecified eye, stage unspecified: Secondary | ICD-10-CM

## 2013-08-12 DIAGNOSIS — H3530 Unspecified macular degeneration: Secondary | ICD-10-CM

## 2013-08-12 DIAGNOSIS — H35039 Hypertensive retinopathy, unspecified eye: Secondary | ICD-10-CM

## 2013-08-24 ENCOUNTER — Other Ambulatory Visit: Payer: Self-pay | Admitting: Cardiovascular Disease

## 2013-08-24 NOTE — Telephone Encounter (Signed)
ramipril (ALTACE) 5 MG tablet  Take 1 tablet (5 mg total) by mouth daily.   Your physician recommends that you continue on your current medications as directed. Please refer to the Current Medication list given to you today. Josue Hector, MD at 06/12/2013 12:23 PM

## 2013-09-01 ENCOUNTER — Ambulatory Visit (INDEPENDENT_AMBULATORY_CARE_PROVIDER_SITE_OTHER): Payer: Medicare Other | Admitting: *Deleted

## 2013-09-01 DIAGNOSIS — G459 Transient cerebral ischemic attack, unspecified: Secondary | ICD-10-CM

## 2013-09-01 DIAGNOSIS — I4891 Unspecified atrial fibrillation: Secondary | ICD-10-CM

## 2013-09-01 DIAGNOSIS — Z7901 Long term (current) use of anticoagulants: Secondary | ICD-10-CM

## 2013-09-01 DIAGNOSIS — Z5181 Encounter for therapeutic drug level monitoring: Secondary | ICD-10-CM

## 2013-09-01 DIAGNOSIS — I6350 Cerebral infarction due to unspecified occlusion or stenosis of unspecified cerebral artery: Secondary | ICD-10-CM

## 2013-09-01 LAB — POCT INR: INR: 2.4

## 2013-09-19 ENCOUNTER — Other Ambulatory Visit: Payer: Self-pay | Admitting: Cardiovascular Disease

## 2013-09-23 ENCOUNTER — Encounter (INDEPENDENT_AMBULATORY_CARE_PROVIDER_SITE_OTHER): Payer: Medicare Other | Admitting: Ophthalmology

## 2013-09-23 DIAGNOSIS — H35039 Hypertensive retinopathy, unspecified eye: Secondary | ICD-10-CM

## 2013-09-23 DIAGNOSIS — H353290 Exudative age-related macular degeneration, unspecified eye, stage unspecified: Secondary | ICD-10-CM

## 2013-09-23 DIAGNOSIS — H3530 Unspecified macular degeneration: Secondary | ICD-10-CM

## 2013-09-23 DIAGNOSIS — I10 Essential (primary) hypertension: Secondary | ICD-10-CM

## 2013-09-23 DIAGNOSIS — H43819 Vitreous degeneration, unspecified eye: Secondary | ICD-10-CM

## 2013-11-11 ENCOUNTER — Encounter (INDEPENDENT_AMBULATORY_CARE_PROVIDER_SITE_OTHER): Payer: Medicare Other | Admitting: Ophthalmology

## 2013-11-11 DIAGNOSIS — H43819 Vitreous degeneration, unspecified eye: Secondary | ICD-10-CM

## 2013-11-11 DIAGNOSIS — I10 Essential (primary) hypertension: Secondary | ICD-10-CM

## 2013-11-11 DIAGNOSIS — H35039 Hypertensive retinopathy, unspecified eye: Secondary | ICD-10-CM

## 2013-11-11 DIAGNOSIS — H3530 Unspecified macular degeneration: Secondary | ICD-10-CM

## 2013-11-11 DIAGNOSIS — H353290 Exudative age-related macular degeneration, unspecified eye, stage unspecified: Secondary | ICD-10-CM

## 2013-11-12 ENCOUNTER — Ambulatory Visit (INDEPENDENT_AMBULATORY_CARE_PROVIDER_SITE_OTHER): Payer: Medicare Other | Admitting: *Deleted

## 2013-11-12 DIAGNOSIS — Z7901 Long term (current) use of anticoagulants: Secondary | ICD-10-CM

## 2013-11-12 DIAGNOSIS — I4891 Unspecified atrial fibrillation: Secondary | ICD-10-CM

## 2013-11-12 DIAGNOSIS — G459 Transient cerebral ischemic attack, unspecified: Secondary | ICD-10-CM

## 2013-11-12 DIAGNOSIS — I6350 Cerebral infarction due to unspecified occlusion or stenosis of unspecified cerebral artery: Secondary | ICD-10-CM

## 2013-11-12 DIAGNOSIS — Z5181 Encounter for therapeutic drug level monitoring: Secondary | ICD-10-CM

## 2013-11-12 LAB — POCT INR: INR: 2.8

## 2013-11-25 ENCOUNTER — Other Ambulatory Visit: Payer: Self-pay | Admitting: Cardiovascular Disease

## 2013-12-22 ENCOUNTER — Ambulatory Visit (INDEPENDENT_AMBULATORY_CARE_PROVIDER_SITE_OTHER): Payer: Medicare Other | Admitting: Pharmacist

## 2013-12-22 DIAGNOSIS — I6350 Cerebral infarction due to unspecified occlusion or stenosis of unspecified cerebral artery: Secondary | ICD-10-CM

## 2013-12-22 DIAGNOSIS — Z5181 Encounter for therapeutic drug level monitoring: Secondary | ICD-10-CM

## 2013-12-22 DIAGNOSIS — G459 Transient cerebral ischemic attack, unspecified: Secondary | ICD-10-CM

## 2013-12-22 DIAGNOSIS — I4891 Unspecified atrial fibrillation: Secondary | ICD-10-CM

## 2013-12-22 DIAGNOSIS — Z7901 Long term (current) use of anticoagulants: Secondary | ICD-10-CM

## 2013-12-22 LAB — POCT INR: INR: 1.9

## 2013-12-30 ENCOUNTER — Encounter (INDEPENDENT_AMBULATORY_CARE_PROVIDER_SITE_OTHER): Payer: Medicare Other | Admitting: Ophthalmology

## 2013-12-30 DIAGNOSIS — H35039 Hypertensive retinopathy, unspecified eye: Secondary | ICD-10-CM

## 2013-12-30 DIAGNOSIS — H353290 Exudative age-related macular degeneration, unspecified eye, stage unspecified: Secondary | ICD-10-CM

## 2013-12-30 DIAGNOSIS — I10 Essential (primary) hypertension: Secondary | ICD-10-CM

## 2013-12-30 DIAGNOSIS — H43819 Vitreous degeneration, unspecified eye: Secondary | ICD-10-CM

## 2013-12-30 DIAGNOSIS — H3530 Unspecified macular degeneration: Secondary | ICD-10-CM

## 2014-01-12 ENCOUNTER — Ambulatory Visit (INDEPENDENT_AMBULATORY_CARE_PROVIDER_SITE_OTHER): Payer: Medicare Other | Admitting: Cardiovascular Disease

## 2014-01-12 ENCOUNTER — Encounter: Payer: Self-pay | Admitting: Cardiovascular Disease

## 2014-01-12 VITALS — BP 120/70 | HR 73 | Ht 67.0 in | Wt 176.0 lb

## 2014-01-12 DIAGNOSIS — I482 Chronic atrial fibrillation, unspecified: Secondary | ICD-10-CM

## 2014-01-12 DIAGNOSIS — I10 Essential (primary) hypertension: Secondary | ICD-10-CM

## 2014-01-12 DIAGNOSIS — E785 Hyperlipidemia, unspecified: Secondary | ICD-10-CM

## 2014-01-12 DIAGNOSIS — I6350 Cerebral infarction due to unspecified occlusion or stenosis of unspecified cerebral artery: Secondary | ICD-10-CM

## 2014-01-12 DIAGNOSIS — I4891 Unspecified atrial fibrillation: Secondary | ICD-10-CM

## 2014-01-12 NOTE — Patient Instructions (Signed)
Your physician wants you to follow-up in:  6 MONTHS WITH DR NISHAN  You will receive a reminder letter in the mail two months in advance. If you don't receive a letter, please call our office to schedule the follow-up appointment. Your physician recommends that you continue on your current medications as directed. Please refer to the Current Medication list given to you today. 

## 2014-01-12 NOTE — Assessment & Plan Note (Signed)
Good rate control and anticoagulation  No bleeding issues  

## 2014-01-12 NOTE — Assessment & Plan Note (Signed)
Well controlled.  Continue current medications and low sodium Dash type diet.    

## 2014-01-12 NOTE — Assessment & Plan Note (Signed)
Cholesterol is at goal.  Continue current dose of statin and diet Rx.  No myalgias or side effects.  F/U  LFT's in 6 months. Lab Results  Component Value Date   [facility]  Value: 104        Total Cholesterol/HDL:CHD Risk Coronary Heart Disease Risk Table                     Men   Women  1/2 Average Risk   3.4   3.3* 11/26/2007

## 2014-01-12 NOTE — Progress Notes (Signed)
Patient ID: Aaron Frye, male   DOB: 02-11-1925, 80 y.o.   MRN: [id number] Aaron Frye is seen today for f/U of HTN, chronic afib and coumadin therapy. He is doing well and continues to work daily at the [employer]. His coumadin dose has been decreased a bit. He has not had any bleeding porblems and sees the clinic monthly. His rate contol has been fine with no palpitations, SSCP or dyspnea. His EF has been in the 35-40% range. He has had no congestive symptoms. 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 He is taking crestor for his cholesterol. Samples given   Still working needs new forms for handicap parking   INR has been labile and a bit low Diet has not varied       ROS: Denies fever, malais, weight loss, blurry vision, decreased visual acuity, cough, sputum, SOB, hemoptysis, pleuritic pain, palpitaitons, heartburn, abdominal pain, melena, lower extremity edema, claudication, or rash.  All other systems reviewed and negative  General: Affect appropriate Elderly white male  HEENT: normal Neck supple with no adenopathy JVP normal no bruits no thyromegaly Lungs clear with no wheezing and good diaphragmatic motion Heart:  S1/S2 no murmur, no rub, gallop or click PMI normal Abdomen: benighn, BS positve, no tenderness, no AAA no bruit.  No HSM or HJR Distal pulses intact with no bruits No edema Neuro non-focal Skin warm and dry No muscular weakness   Current Outpatient Prescriptions  Medication Sig Dispense Refill  . aspirin 81 MG tablet Take 81 mg by mouth daily.        [name] atorvastatin (LIPITOR) 40 MG tablet TAKE ONE TABLET BY MOUTH EVERY DAY  90 tablet  1  . hydrochlorothiazide (MICROZIDE) 12.5 MG capsule TAKE ONE CAPSULE BY MOUTH ONCE DAILY IN THE MORNING  30 capsule  5  . metoprolol succinate (TOPROL-XL) 100 MG 24 hr tablet TAKE ONE-HALF TABLET BY MOUTH ONCE DAILY  45 tablet   3  . ramipril (ALTACE) 5 MG capsule TAKE ONE CAPSULE BY MOUTH EVERY DAY  30 capsule  6  . warfarin (COUMADIN) 5 MG tablet TAKE ONE TABLET BY MOUTH AS DIRECTED  100 tablet  1   No current facility-administered medications for this visit.    Allergies  Penicillins  Electrocardiogram:  afib rate 78 otherwise normal  Assessment and Plan

## 2014-01-27 ENCOUNTER — Other Ambulatory Visit: Payer: Self-pay | Admitting: Cardiovascular Disease

## 2014-02-02 ENCOUNTER — Ambulatory Visit (INDEPENDENT_AMBULATORY_CARE_PROVIDER_SITE_OTHER): Payer: Medicare Other

## 2014-02-02 DIAGNOSIS — Z7901 Long term (current) use of anticoagulants: Secondary | ICD-10-CM

## 2014-02-02 DIAGNOSIS — G459 Transient cerebral ischemic attack, unspecified: Secondary | ICD-10-CM

## 2014-02-02 DIAGNOSIS — Z5181 Encounter for therapeutic drug level monitoring: Secondary | ICD-10-CM

## 2014-02-02 DIAGNOSIS — I4891 Unspecified atrial fibrillation: Secondary | ICD-10-CM

## 2014-02-02 DIAGNOSIS — I6350 Cerebral infarction due to unspecified occlusion or stenosis of unspecified cerebral artery: Secondary | ICD-10-CM

## 2014-02-02 LAB — POCT INR: INR: 2.4

## 2014-02-17 ENCOUNTER — Encounter (INDEPENDENT_AMBULATORY_CARE_PROVIDER_SITE_OTHER): Payer: Medicare Other | Admitting: Ophthalmology

## 2014-02-17 DIAGNOSIS — H35039 Hypertensive retinopathy, unspecified eye: Secondary | ICD-10-CM

## 2014-02-17 DIAGNOSIS — I10 Essential (primary) hypertension: Secondary | ICD-10-CM

## 2014-02-17 DIAGNOSIS — H3530 Unspecified macular degeneration: Secondary | ICD-10-CM

## 2014-02-17 DIAGNOSIS — H353290 Exudative age-related macular degeneration, unspecified eye, stage unspecified: Secondary | ICD-10-CM

## 2014-02-17 DIAGNOSIS — H43819 Vitreous degeneration, unspecified eye: Secondary | ICD-10-CM

## 2014-03-16 ENCOUNTER — Ambulatory Visit (INDEPENDENT_AMBULATORY_CARE_PROVIDER_SITE_OTHER): Payer: Medicare Other | Admitting: *Deleted

## 2014-03-16 DIAGNOSIS — I4891 Unspecified atrial fibrillation: Secondary | ICD-10-CM

## 2014-03-16 DIAGNOSIS — G459 Transient cerebral ischemic attack, unspecified: Secondary | ICD-10-CM

## 2014-03-16 DIAGNOSIS — Z7901 Long term (current) use of anticoagulants: Secondary | ICD-10-CM

## 2014-03-16 DIAGNOSIS — I6350 Cerebral infarction due to unspecified occlusion or stenosis of unspecified cerebral artery: Secondary | ICD-10-CM

## 2014-03-16 DIAGNOSIS — Z5181 Encounter for therapeutic drug level monitoring: Secondary | ICD-10-CM

## 2014-03-16 LAB — POCT INR: INR: 2.6

## 2014-03-18 ENCOUNTER — Other Ambulatory Visit: Payer: Self-pay | Admitting: Cardiovascular Disease

## 2014-04-07 ENCOUNTER — Encounter (INDEPENDENT_AMBULATORY_CARE_PROVIDER_SITE_OTHER): Payer: Medicare Other | Admitting: Ophthalmology

## 2014-04-07 DIAGNOSIS — H3531 Nonexudative age-related macular degeneration: Secondary | ICD-10-CM

## 2014-04-07 DIAGNOSIS — H3532 Exudative age-related macular degeneration: Secondary | ICD-10-CM

## 2014-04-07 DIAGNOSIS — H35033 Hypertensive retinopathy, bilateral: Secondary | ICD-10-CM

## 2014-04-07 DIAGNOSIS — H43813 Vitreous degeneration, bilateral: Secondary | ICD-10-CM

## 2014-04-07 DIAGNOSIS — I10 Essential (primary) hypertension: Secondary | ICD-10-CM

## 2014-04-07 DIAGNOSIS — H35371 Puckering of macula, right eye: Secondary | ICD-10-CM

## 2014-04-27 ENCOUNTER — Ambulatory Visit (INDEPENDENT_AMBULATORY_CARE_PROVIDER_SITE_OTHER): Payer: Medicare Other | Admitting: *Deleted

## 2014-04-27 DIAGNOSIS — I639 Cerebral infarction, unspecified: Secondary | ICD-10-CM

## 2014-04-27 DIAGNOSIS — I6350 Cerebral infarction due to unspecified occlusion or stenosis of unspecified cerebral artery: Secondary | ICD-10-CM

## 2014-04-27 DIAGNOSIS — I4891 Unspecified atrial fibrillation: Secondary | ICD-10-CM

## 2014-04-27 DIAGNOSIS — Z5181 Encounter for therapeutic drug level monitoring: Secondary | ICD-10-CM

## 2014-04-27 DIAGNOSIS — G459 Transient cerebral ischemic attack, unspecified: Secondary | ICD-10-CM

## 2014-04-27 DIAGNOSIS — Z7901 Long term (current) use of anticoagulants: Secondary | ICD-10-CM

## 2014-04-27 LAB — POCT INR: INR: 2.5

## 2014-05-01 ENCOUNTER — Other Ambulatory Visit: Payer: Self-pay

## 2014-05-01 MED ORDER — HYDROCHLOROTHIAZIDE 12.5 MG PO CAPS: ORAL_CAPSULE | ORAL | Status: DC

## 2014-05-21 ENCOUNTER — Encounter (INDEPENDENT_AMBULATORY_CARE_PROVIDER_SITE_OTHER): Payer: Medicare Other | Admitting: Ophthalmology

## 2014-05-21 DIAGNOSIS — H35033 Hypertensive retinopathy, bilateral: Secondary | ICD-10-CM

## 2014-05-21 DIAGNOSIS — H3531 Nonexudative age-related macular degeneration: Secondary | ICD-10-CM

## 2014-05-21 DIAGNOSIS — H3532 Exudative age-related macular degeneration: Secondary | ICD-10-CM

## 2014-05-21 DIAGNOSIS — I10 Essential (primary) hypertension: Secondary | ICD-10-CM

## 2014-05-21 DIAGNOSIS — H43813 Vitreous degeneration, bilateral: Secondary | ICD-10-CM

## 2014-05-28 ENCOUNTER — Other Ambulatory Visit: Payer: Self-pay | Admitting: Cardiovascular Disease

## 2014-05-28 NOTE — Telephone Encounter (Signed)
Patient has enough warfarin for the weekend and early next week. Patient advised to follow up with coumadin clinic on Monday to receive refills for three months.  Ewell Poe RN

## 2014-05-31 NOTE — Telephone Encounter (Signed)
Please address this. 

## 2014-06-04 ENCOUNTER — Other Ambulatory Visit: Payer: Self-pay | Admitting: *Deleted

## 2014-06-04 ENCOUNTER — Other Ambulatory Visit: Payer: Self-pay | Admitting: Cardiovascular Disease

## 2014-06-22 ENCOUNTER — Ambulatory Visit (INDEPENDENT_AMBULATORY_CARE_PROVIDER_SITE_OTHER): Payer: Medicare Other | Admitting: Pharmacist

## 2014-06-22 DIAGNOSIS — Z5181 Encounter for therapeutic drug level monitoring: Secondary | ICD-10-CM

## 2014-06-22 DIAGNOSIS — I6350 Cerebral infarction due to unspecified occlusion or stenosis of unspecified cerebral artery: Secondary | ICD-10-CM

## 2014-06-22 DIAGNOSIS — I639 Cerebral infarction, unspecified: Secondary | ICD-10-CM

## 2014-06-22 DIAGNOSIS — G459 Transient cerebral ischemic attack, unspecified: Secondary | ICD-10-CM

## 2014-06-22 DIAGNOSIS — Z7901 Long term (current) use of anticoagulants: Secondary | ICD-10-CM

## 2014-06-22 DIAGNOSIS — I4891 Unspecified atrial fibrillation: Secondary | ICD-10-CM

## 2014-06-22 LAB — POCT INR: INR: 3.1

## 2014-07-09 ENCOUNTER — Encounter (INDEPENDENT_AMBULATORY_CARE_PROVIDER_SITE_OTHER): Payer: Medicare Other | Admitting: Ophthalmology

## 2014-07-09 DIAGNOSIS — H3531 Nonexudative age-related macular degeneration: Secondary | ICD-10-CM

## 2014-07-09 DIAGNOSIS — H35033 Hypertensive retinopathy, bilateral: Secondary | ICD-10-CM

## 2014-07-09 DIAGNOSIS — H3532 Exudative age-related macular degeneration: Secondary | ICD-10-CM

## 2014-07-09 DIAGNOSIS — H43813 Vitreous degeneration, bilateral: Secondary | ICD-10-CM

## 2014-07-09 DIAGNOSIS — I10 Essential (primary) hypertension: Secondary | ICD-10-CM

## 2014-07-22 ENCOUNTER — Other Ambulatory Visit: Payer: Self-pay | Admitting: Cardiovascular Disease

## 2014-07-28 ENCOUNTER — Other Ambulatory Visit: Payer: Self-pay | Admitting: Cardiovascular Disease

## 2014-08-03 ENCOUNTER — Ambulatory Visit (INDEPENDENT_AMBULATORY_CARE_PROVIDER_SITE_OTHER): Payer: Medicare Other | Admitting: *Deleted

## 2014-08-03 DIAGNOSIS — Z5181 Encounter for therapeutic drug level monitoring: Secondary | ICD-10-CM

## 2014-08-03 DIAGNOSIS — I639 Cerebral infarction, unspecified: Secondary | ICD-10-CM

## 2014-08-03 DIAGNOSIS — Z7901 Long term (current) use of anticoagulants: Secondary | ICD-10-CM

## 2014-08-03 DIAGNOSIS — G459 Transient cerebral ischemic attack, unspecified: Secondary | ICD-10-CM

## 2014-08-03 DIAGNOSIS — I6350 Cerebral infarction due to unspecified occlusion or stenosis of unspecified cerebral artery: Secondary | ICD-10-CM

## 2014-08-03 DIAGNOSIS — I4891 Unspecified atrial fibrillation: Secondary | ICD-10-CM

## 2014-08-03 LAB — POCT INR: INR: 2.6

## 2014-08-20 ENCOUNTER — Other Ambulatory Visit: Payer: Self-pay | Admitting: Cardiovascular Disease

## 2014-08-23 NOTE — Progress Notes (Signed)
Patient ID: Aaron Frye, male   DOB: 04-Sep-1924, 81 y.o.   MRN: [id number]   Aaron Frye is seen today for f/U of HTN, chronic afib and coumadin therapy. He is doing well and continues to work daily at the [employer]. His coumadin dose has been decreased a bit. He has not had any bleeding porblems and sees the clinic monthly. His rate contol has been fine with no palpitations, SSCP or dyspnea. His EF has been in the 35-40% range. He has had no congestive symptoms. 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 He is taking crestor for his cholesterol. Samples given   Still working needs new forms for handicap parking   INR has been labile and a bit low Diet has not varied    ROS: Denies fever, malais, weight loss, blurry vision, decreased visual acuity, cough, sputum, SOB, hemoptysis, pleuritic pain, palpitaitons, heartburn, abdominal pain, melena, lower extremity edema, claudication, or rash.  All other systems reviewed and negative  General: Affect appropriate Elderly white male  HEENT: normal Neck supple with no adenopathy JVP normal no bruits no thyromegaly Lungs clear with no wheezing and good diaphragmatic motion Heart:  S1/S2 no murmur, no rub, gallop or click PMI normal Abdomen: benighn, BS positve, no tenderness, no AAA no bruit.  No HSM or HJR Distal pulses intact with no bruits No edema Neuro non-focal Skin warm and dry No muscular weakness   Current Outpatient Prescriptions  Medication Sig Dispense Refill  . aspirin 81 MG tablet Take 81 mg by mouth daily.      [name] atorvastatin (LIPITOR) 40 MG tablet TAKE ONE TABLET BY MOUTH ONCE DAILY 90 tablet 0  . hydrochlorothiazide (MICROZIDE) 12.5 MG capsule TAKE ONE CAPSULE BY MOUTH ONCE DAILY IN THE MORNING 90 capsule 1  . metoprolol succinate (TOPROL-XL) 100 MG 24 hr tablet TAKE ONE-HALF TABLET BY MOUTH ONCE DAILY 45 tablet 3  .  ramipril (ALTACE) 5 MG capsule TAKE ONE CAPSULE BY MOUTH ONCE DAILY. 30 capsule 0  . warfarin (COUMADIN) 5 MG tablet Take as directed by coumadin clinic 100 tablet 0   No current facility-administered medications for this visit.    Allergies  Penicillins  Electrocardiogram:  afib rate 78 otherwise normal 7/15   Assessment and Plan

## 2014-08-24 ENCOUNTER — Encounter: Payer: Self-pay | Admitting: Cardiovascular Disease

## 2014-08-24 ENCOUNTER — Ambulatory Visit (INDEPENDENT_AMBULATORY_CARE_PROVIDER_SITE_OTHER): Payer: Medicare Other | Admitting: Cardiovascular Disease

## 2014-08-24 VITALS — BP 142/74 | HR 76 | Ht 67.0 in | Wt 179.0 lb

## 2014-08-24 DIAGNOSIS — E785 Hyperlipidemia, unspecified: Secondary | ICD-10-CM

## 2014-08-24 DIAGNOSIS — I10 Essential (primary) hypertension: Secondary | ICD-10-CM

## 2014-08-24 DIAGNOSIS — I482 Chronic atrial fibrillation, unspecified: Secondary | ICD-10-CM

## 2014-08-24 DIAGNOSIS — I639 Cerebral infarction, unspecified: Secondary | ICD-10-CM

## 2014-08-24 NOTE — Assessment & Plan Note (Signed)
Good rate control and anticoagulation F/U coumaidin clinic INR Rx no bleeding issues despite age

## 2014-08-24 NOTE — Assessment & Plan Note (Signed)
Cholesterol is at goal.  Continue current dose of statin and diet Rx.  No myalgias or side effects.  F/U  LFT's in 6 months. Lab Results  Component Value Date   LDLCALC * 11/26/2007    104        Total Cholesterol/HDL:CHD Risk Coronary Heart Disease Risk Table                     Men   Women  1/2 Average Risk   3.4   3.3  Labs with primary

## 2014-08-24 NOTE — Patient Instructions (Signed)
Your physician wants you to follow-up in:  6 MONTHS WITH DR NISHAN  You will receive a reminder letter in the mail two months in advance. If you don't receive a letter, please call our office to schedule the follow-up appointment. Your physician recommends that you continue on your current medications as directed. Please refer to the Current Medication list given to you today. 

## 2014-08-24 NOTE — Assessment & Plan Note (Signed)
Well controlled.  Continue current medications and low sodium Dash type diet.    

## 2014-08-27 ENCOUNTER — Encounter (INDEPENDENT_AMBULATORY_CARE_PROVIDER_SITE_OTHER): Payer: Medicare Other | Admitting: Ophthalmology

## 2014-08-27 DIAGNOSIS — I10 Essential (primary) hypertension: Secondary | ICD-10-CM

## 2014-08-27 DIAGNOSIS — H35373 Puckering of macula, bilateral: Secondary | ICD-10-CM | POA: Diagnosis not present

## 2014-08-27 DIAGNOSIS — H35033 Hypertensive retinopathy, bilateral: Secondary | ICD-10-CM | POA: Diagnosis not present

## 2014-08-27 DIAGNOSIS — H43813 Vitreous degeneration, bilateral: Secondary | ICD-10-CM | POA: Diagnosis not present

## 2014-08-27 DIAGNOSIS — H3532 Exudative age-related macular degeneration: Secondary | ICD-10-CM | POA: Diagnosis not present

## 2014-08-27 DIAGNOSIS — H3531 Nonexudative age-related macular degeneration: Secondary | ICD-10-CM | POA: Diagnosis not present

## 2014-08-31 DIAGNOSIS — S7291XA Unspecified fracture of right femur, initial encounter for closed fracture: Secondary | ICD-10-CM

## 2014-08-31 HISTORY — DX: Unspecified fracture of right femur, initial encounter for closed fracture: S72.91XA

## 2014-09-14 ENCOUNTER — Ambulatory Visit (INDEPENDENT_AMBULATORY_CARE_PROVIDER_SITE_OTHER): Payer: Medicare Other | Admitting: *Deleted

## 2014-09-14 DIAGNOSIS — I4891 Unspecified atrial fibrillation: Secondary | ICD-10-CM | POA: Diagnosis not present

## 2014-09-14 DIAGNOSIS — Z5181 Encounter for therapeutic drug level monitoring: Secondary | ICD-10-CM | POA: Diagnosis not present

## 2014-09-14 DIAGNOSIS — G459 Transient cerebral ischemic attack, unspecified: Secondary | ICD-10-CM

## 2014-09-14 DIAGNOSIS — I639 Cerebral infarction, unspecified: Secondary | ICD-10-CM | POA: Diagnosis not present

## 2014-09-14 DIAGNOSIS — Z7901 Long term (current) use of anticoagulants: Secondary | ICD-10-CM | POA: Diagnosis not present

## 2014-09-14 DIAGNOSIS — I482 Chronic atrial fibrillation, unspecified: Secondary | ICD-10-CM

## 2014-09-14 DIAGNOSIS — I6350 Cerebral infarction due to unspecified occlusion or stenosis of unspecified cerebral artery: Secondary | ICD-10-CM

## 2014-09-14 LAB — POCT INR: INR: 2.7

## 2014-09-18 ENCOUNTER — Other Ambulatory Visit: Payer: Self-pay | Admitting: Cardiovascular Disease

## 2014-09-23 ENCOUNTER — Emergency Department (HOSPITAL_COMMUNITY): Payer: Medicare Other

## 2014-09-23 ENCOUNTER — Inpatient Hospital Stay (HOSPITAL_COMMUNITY)
Admission: EM | Admit: 2014-09-23 | Discharge: 2014-09-27 | DRG: 481 | Disposition: A | Discharge status: Rehabilitation Facility | Payer: Medicare Other | Attending: Internal Medicine | Admitting: Internal Medicine

## 2014-09-23 ENCOUNTER — Other Ambulatory Visit (HOSPITAL_COMMUNITY): Payer: Self-pay

## 2014-09-23 ENCOUNTER — Encounter (HOSPITAL_COMMUNITY): Payer: Self-pay | Admitting: Emergency Medicine

## 2014-09-23 DIAGNOSIS — E785 Hyperlipidemia, unspecified: Secondary | ICD-10-CM | POA: Diagnosis present

## 2014-09-23 DIAGNOSIS — M25559 Pain in unspecified hip: Secondary | ICD-10-CM

## 2014-09-23 DIAGNOSIS — I481 Persistent atrial fibrillation: Secondary | ICD-10-CM | POA: Diagnosis not present

## 2014-09-23 DIAGNOSIS — I050 Rheumatic mitral stenosis: Secondary | ICD-10-CM | POA: Diagnosis not present

## 2014-09-23 DIAGNOSIS — D72829 Elevated white blood cell count, unspecified: Secondary | ICD-10-CM

## 2014-09-23 DIAGNOSIS — N390 Urinary tract infection, site not specified: Secondary | ICD-10-CM | POA: Diagnosis present

## 2014-09-23 DIAGNOSIS — I482 Chronic atrial fibrillation, unspecified: Secondary | ICD-10-CM | POA: Diagnosis present

## 2014-09-23 DIAGNOSIS — K5900 Constipation, unspecified: Secondary | ICD-10-CM | POA: Diagnosis present

## 2014-09-23 DIAGNOSIS — Z7901 Long term (current) use of anticoagulants: Secondary | ICD-10-CM | POA: Diagnosis not present

## 2014-09-23 DIAGNOSIS — R319 Hematuria, unspecified: Secondary | ICD-10-CM | POA: Diagnosis not present

## 2014-09-23 DIAGNOSIS — S72141S Displaced intertrochanteric fracture of right femur, sequela: Secondary | ICD-10-CM | POA: Diagnosis not present

## 2014-09-23 DIAGNOSIS — I10 Essential (primary) hypertension: Secondary | ICD-10-CM | POA: Diagnosis not present

## 2014-09-23 DIAGNOSIS — Z8546 Personal history of malignant neoplasm of prostate: Secondary | ICD-10-CM | POA: Diagnosis not present

## 2014-09-23 DIAGNOSIS — I5022 Chronic systolic (congestive) heart failure: Secondary | ICD-10-CM | POA: Diagnosis present

## 2014-09-23 DIAGNOSIS — E871 Hypo-osmolality and hyponatremia: Secondary | ICD-10-CM | POA: Diagnosis present

## 2014-09-23 DIAGNOSIS — Z8673 Personal history of transient ischemic attack (TIA), and cerebral infarction without residual deficits: Secondary | ICD-10-CM | POA: Diagnosis not present

## 2014-09-23 DIAGNOSIS — S72009A Fracture of unspecified part of neck of unspecified femur, initial encounter for closed fracture: Secondary | ICD-10-CM | POA: Insufficient documentation

## 2014-09-23 DIAGNOSIS — S72141A Displaced intertrochanteric fracture of right femur, initial encounter for closed fracture: Secondary | ICD-10-CM | POA: Diagnosis present

## 2014-09-23 DIAGNOSIS — Z01818 Encounter for other preprocedural examination: Secondary | ICD-10-CM

## 2014-09-23 DIAGNOSIS — I509 Heart failure, unspecified: Secondary | ICD-10-CM

## 2014-09-23 DIAGNOSIS — S72143A Displaced intertrochanteric fracture of unspecified femur, initial encounter for closed fracture: Secondary | ICD-10-CM | POA: Insufficient documentation

## 2014-09-23 DIAGNOSIS — R002 Palpitations: Secondary | ICD-10-CM | POA: Diagnosis present

## 2014-09-23 DIAGNOSIS — S7291XA Unspecified fracture of right femur, initial encounter for closed fracture: Secondary | ICD-10-CM

## 2014-09-23 DIAGNOSIS — I5020 Unspecified systolic (congestive) heart failure: Secondary | ICD-10-CM

## 2014-09-23 DIAGNOSIS — Z923 Personal history of irradiation: Secondary | ICD-10-CM

## 2014-09-23 DIAGNOSIS — W06XXXA Fall from bed, initial encounter: Secondary | ICD-10-CM | POA: Diagnosis present

## 2014-09-23 DIAGNOSIS — Z880 Allergy status to penicillin: Secondary | ICD-10-CM

## 2014-09-23 DIAGNOSIS — W19XXXA Unspecified fall, initial encounter: Secondary | ICD-10-CM

## 2014-09-23 DIAGNOSIS — B9620 Unspecified Escherichia coli [E. coli] as the cause of diseases classified elsewhere: Secondary | ICD-10-CM | POA: Diagnosis present

## 2014-09-23 DIAGNOSIS — H9190 Unspecified hearing loss, unspecified ear: Secondary | ICD-10-CM | POA: Diagnosis present

## 2014-09-23 DIAGNOSIS — D62 Acute posthemorrhagic anemia: Secondary | ICD-10-CM | POA: Diagnosis not present

## 2014-09-23 DIAGNOSIS — I429 Cardiomyopathy, unspecified: Secondary | ICD-10-CM | POA: Diagnosis not present

## 2014-09-23 DIAGNOSIS — I5021 Acute systolic (congestive) heart failure: Secondary | ICD-10-CM | POA: Diagnosis not present

## 2014-09-23 DIAGNOSIS — M25551 Pain in right hip: Secondary | ICD-10-CM | POA: Diagnosis present

## 2014-09-23 DIAGNOSIS — I4891 Unspecified atrial fibrillation: Secondary | ICD-10-CM

## 2014-09-23 DIAGNOSIS — I5033 Acute on chronic diastolic (congestive) heart failure: Secondary | ICD-10-CM | POA: Diagnosis not present

## 2014-09-23 DIAGNOSIS — Z8249 Family history of ischemic heart disease and other diseases of the circulatory system: Secondary | ICD-10-CM

## 2014-09-23 LAB — URINALYSIS, ROUTINE W REFLEX MICROSCOPIC
Bilirubin Urine: NEGATIVE
Glucose, UA: NEGATIVE mg/dL
Ketones, ur: NEGATIVE mg/dL
Leukocytes, UA: NEGATIVE
Nitrite: POSITIVE — AB
Protein, ur: NEGATIVE mg/dL
Specific Gravity, Urine: 1.021 (ref 1.005–1.030)
Urobilinogen, UA: 0.2 mg/dL (ref 0.0–1.0)
pH: 5.5 (ref 5.0–8.0)

## 2014-09-23 LAB — PROTIME-INR
INR: 2.2 — ABNORMAL HIGH (ref 0.00–1.49)
Prothrombin Time: 24.6 seconds — ABNORMAL HIGH (ref 11.6–15.2)

## 2014-09-23 LAB — BASIC METABOLIC PANEL
Anion gap: 11 (ref 5–15)
BUN: 20 mg/dL (ref 6–23)
CO2: 26 mmol/L (ref 19–32)
Calcium: 9.3 mg/dL (ref 8.4–10.5)
Chloride: 100 mmol/L (ref 96–112)
Creatinine, Ser: 0.79 mg/dL (ref 0.50–1.35)
GFR calc Af Amer: >90 mL/min (ref >90)
GFR calc non Af Amer: 78 mL/min — ABNORMAL LOW (ref >90)
Glucose, Bld: 159 mg/dL — ABNORMAL HIGH (ref 70–99)
Potassium: 4.1 mmol/L (ref 3.5–5.1)
Sodium: 137 mmol/L (ref 135–145)

## 2014-09-23 LAB — CBC WITH DIFFERENTIAL/PLATELET
Basophils Absolute: 0.1 10*3/uL (ref 0.0–0.1)
Basophils Relative: 0 % (ref 0–1)
Eosinophils Absolute: 0.2 10*3/uL (ref 0.0–0.7)
Eosinophils Relative: 1 % (ref 0–5)
HCT: 42.4 % (ref 39.0–52.0)
Hemoglobin: 13.4 g/dL (ref 13.0–17.0)
Lymphocytes Relative: 7 % — ABNORMAL LOW (ref 12–46)
Lymphs Abs: 1.1 10*3/uL (ref 0.7–4.0)
MCH: 25 pg — ABNORMAL LOW (ref 26.0–34.0)
MCHC: 31.6 g/dL (ref 30.0–36.0)
MCV: 79 fL (ref 78.0–100.0)
Monocytes Absolute: 1.1 10*3/uL — ABNORMAL HIGH (ref 0.1–1.0)
Monocytes Relative: 7 % (ref 3–12)
Neutro Abs: 13.1 10*3/uL — ABNORMAL HIGH (ref 1.7–7.7)
Neutrophils Relative %: 85 % — ABNORMAL HIGH (ref 43–77)
Platelets: 186 10*3/uL (ref 150–400)
RBC: 5.37 MIL/uL (ref 4.22–5.81)
RDW: 15.9 % — ABNORMAL HIGH (ref 11.5–15.5)
WBC: 15.6 10*3/uL — ABNORMAL HIGH (ref 4.0–10.5)

## 2014-09-23 LAB — URINE MICROSCOPIC-ADD ON

## 2014-09-23 MED ORDER — ONDANSETRON HCL 4 MG/2ML IJ SOLN
4.0000 mg | Freq: Once | INTRAMUSCULAR | Status: AC
  Administered 2014-09-23: 4 mg via INTRAVENOUS
  Filled 2014-09-23: qty 2

## 2014-09-23 MED ORDER — ACETAMINOPHEN 650 MG RE SUPP: 650.0000 mg | Freq: Four times a day (QID) | RECTAL | Status: DC | PRN

## 2014-09-23 MED ORDER — METOPROLOL SUCCINATE ER 50 MG PO TB24
50.0000 mg | ORAL_TABLET | Freq: Every day | ORAL | Status: DC
  Administered 2014-09-24 – 2014-09-27 (×3): 50 mg via ORAL
  Filled 2014-09-23 (×4): qty 1

## 2014-09-23 MED ORDER — FENTANYL CITRATE 0.05 MG/ML IJ SOLN
50.0000 ug | Freq: Once | INTRAMUSCULAR | Status: AC
Start: 2014-09-23 — End: 2014-09-23
  Administered 2014-09-23: 50 ug via INTRAVENOUS
  Filled 2014-09-23: qty 2

## 2014-09-23 MED ORDER — PHYTONADIONE 5 MG PO TABS
10.0000 mg | ORAL_TABLET | Freq: Once | ORAL | Status: AC
  Administered 2014-09-23: 10 mg via ORAL
  Filled 2014-09-23: qty 2

## 2014-09-23 MED ORDER — RAMIPRIL 5 MG PO CAPS
5.0000 mg | ORAL_CAPSULE | Freq: Every day | ORAL | Status: DC
  Administered 2014-09-24 – 2014-09-27 (×3): 5 mg via ORAL
  Filled 2014-09-23 (×4): qty 1

## 2014-09-23 MED ORDER — ONDANSETRON HCL 4 MG PO TABS: 4.0000 mg | ORAL_TABLET | Freq: Four times a day (QID) | ORAL | Status: DC | PRN

## 2014-09-23 MED ORDER — HEPARIN (PORCINE) IN NACL 100-0.45 UNIT/ML-% IJ SOLN
1100.0000 [IU]/h | INTRAMUSCULAR | Status: DC
  Filled 2014-09-23: qty 250

## 2014-09-23 MED ORDER — ACETAMINOPHEN 325 MG PO TABS: 650.0000 mg | ORAL_TABLET | Freq: Four times a day (QID) | ORAL | Status: DC | PRN

## 2014-09-23 MED ORDER — ALUM & MAG HYDROXIDE-SIMETH 200-200-20 MG/5ML PO SUSP
30.0000 mL | Freq: Four times a day (QID) | ORAL | Status: DC | PRN
  Filled 2014-09-23: qty 30

## 2014-09-23 MED ORDER — OXYCODONE-ACETAMINOPHEN 5-325 MG PO TABS: 1.0000 | ORAL_TABLET | ORAL | Status: DC | PRN

## 2014-09-23 MED ORDER — CHLORHEXIDINE GLUCONATE 4 % EX LIQD
60.0000 mL | Freq: Once | CUTANEOUS | Status: DC
  Filled 2014-09-23: qty 60

## 2014-09-23 MED ORDER — DEXTROSE 5 % IV SOLN: 1.0000 g | Freq: Once | INTRAVENOUS | Status: DC

## 2014-09-23 MED ORDER — MORPHINE SULFATE 2 MG/ML IJ SOLN
2.0000 mg | INTRAMUSCULAR | Status: DC | PRN
  Administered 2014-09-24: 2 mg via INTRAVENOUS
  Filled 2014-09-23: qty 1

## 2014-09-23 MED ORDER — HYDROCHLOROTHIAZIDE 12.5 MG PO CAPS: 12.5000 mg | ORAL_CAPSULE | Freq: Every day | ORAL | Status: DC

## 2014-09-23 MED ORDER — SODIUM CHLORIDE 0.9 % IJ SOLN
3.0000 mL | Freq: Two times a day (BID) | INTRAMUSCULAR | Status: DC
  Administered 2014-09-24: 3 mL via INTRAVENOUS

## 2014-09-23 MED ORDER — ASPIRIN 81 MG PO CHEW
81.0000 mg | CHEWABLE_TABLET | Freq: Every day | ORAL | Status: DC
  Administered 2014-09-24 – 2014-09-27 (×4): 81 mg via ORAL
  Filled 2014-09-23 (×4): qty 1

## 2014-09-23 MED ORDER — CLINDAMYCIN PHOSPHATE 900 MG/50ML IV SOLN
900.0000 mg | INTRAVENOUS | Status: AC
  Administered 2014-09-24: 900 mg via INTRAVENOUS
  Filled 2014-09-23 (×3): qty 50

## 2014-09-23 MED ORDER — ONDANSETRON HCL 4 MG/2ML IJ SOLN: 4.0000 mg | Freq: Four times a day (QID) | INTRAMUSCULAR | Status: DC | PRN

## 2014-09-23 MED ORDER — ATORVASTATIN CALCIUM 40 MG PO TABS
40.0000 mg | ORAL_TABLET | Freq: Every day | ORAL | Status: DC
  Administered 2014-09-24 – 2014-09-27 (×4): 40 mg via ORAL
  Filled 2014-09-23 (×4): qty 1

## 2014-09-23 NOTE — ED Notes (Signed)
Per EMS: patient was sitting on edge of bed, bed moved and patient slipped landing on right side, hit face but no obvious deformities to face or head.  Patient initially passed SCCA with EMS however upon standing shortening and rotation was noted to right lower extremity.  Unable to put weight on that extremity.  On blood thinners, afib with hx, 50 mcg fentanyl given in route. VSS stable en route.

## 2014-09-23 NOTE — ED Provider Notes (Signed)
81 year old man presents tonight after fall with right hip fracture. History of atrial fibrillation on Coumadin. Patient is hemodynamically stable here and does not appear to any other injuries. Orthopedics and hospitalists have been consulted. I saw and evaluated the patient, reviewed the resident's note and I agree with the findings and plan.   Pattricia Boss, MD 09/23/14 2220

## 2014-09-23 NOTE — ED Notes (Addendum)
Patient transported to CT 

## 2014-09-23 NOTE — H&P (Addendum)
[facility] History and Physical  Aaron Frye NID:[id number] DOB: 1925/01/20 DOA: 09/23/2014  Referring physician: ED physician PCP: Stephens Shire, MD  Specialists:   Chief Complaint:  right hip pain after fall.  HPI: Aaron Frye is a 81 y.o. male with past medical history of TIA, hyperlipidemia, hypertension, previous CVA, A. fib on Coumadin, history of prostate cancer (post status of surgery and radiation therapy), systolic congestive heart failure with EF of 35-40%, who presents with right hip pain after fall.  Patient reports that at about 4 to 5 PM, he was leaning against his bed putting on his pants, the bed pushed backwards and he fell onto his right side. After that, he developed right hip pain and was unable to stand. It was noticed that he has some shortening of that extremity by EMS. Patient denies fever, chills, headaches, cough, chest pain, SOB, abdominal pain, diarrhea, constipation, dysuria, hematuria, skin rashes. No unilateral weakness, numbness or tingling sensations. No vision change or hearing loss. Patient has mild urinary incontinence which is not new issue.  In ED, patient was found to have intertrochanteric fracture of right hip by x-ray. An AR 2.29, and 8.6, WBC 15.6, electrolytes okay. Urinalysis showed positive nitrite, negative leukocyte. Patient is asymptomatic for UTI. Patient is admitted to inpatient for further evaluation and treatment. Orthopedic surgeon was consulted by ED.  Review of Systems: As presented in the history of presenting illness, rest negative.  Where does patient live?  At home Can patient participate in ADLs? Yes  Allergy:  Allergies  Allergen Reactions  . Penicillins     REACTION: unspecified    Past Medical History  Diagnosis Date  . TIA (transient ischemic attack)   . HLD (hyperlipidemia)   . HTN (hypertension)   . CVA (cerebral vascular accident)   . Atrial fibrillation   . Prostate cancer     Past Surgical  History  Procedure Laterality Date  . Appendectomy    . Tonsillectomy      Social History:  reports that he has never smoked. He does not have any smokeless tobacco history on file. He reports that he does not drink alcohol or use illicit drugs.  Family History:  Family History  Problem Relation Age of Onset  . Heart failure Mother   . Lung disease Father   . Cancer Brother     Not sure which type of cancer     Prior to Admission medications   Medication Sig Start Date End Date Taking? Authorizing Provider  aspirin 81 MG tablet Take 81 mg by mouth daily.     Yes Historical Provider, MD  atorvastatin (LIPITOR) 40 MG tablet TAKE ONE TABLET BY MOUTH ONCE DAILY 07/29/14  Yes Josue Hector, MD  hydrochlorothiazide (MICROZIDE) 12.5 MG capsule TAKE ONE CAPSULE BY MOUTH ONCE DAILY IN THE MORNING 05/01/14  Yes Josue Hector, MD  metoprolol succinate (TOPROL-XL) 100 MG 24 hr tablet Take 50 mg by mouth daily. Take with or immediately following a meal.   Yes Historical Provider, MD  ramipril (ALTACE) 5 MG capsule TAKE ONE CAPSULE BY MOUTH ONCE DAILY 08/24/14  Yes Josue Hector, MD  warfarin (COUMADIN) 5 MG tablet Take 2.5-5 mg by mouth See admin instructions. Take 5mg  every day except on Tuesday take 2.5mg  per patient   Yes Historical Provider, MD  metoprolol succinate (TOPROL-XL) 100 MG 24 hr tablet TAKE ONE-HALF TABLET BY MOUTH ONCE DAILY Patient not taking: Reported on 09/23/2014 09/20/14   Wallis Bamberg  Johnsie Cancel, MD  warfarin (COUMADIN) 5 MG tablet Take as directed by coumadin clinic Patient not taking: Reported on 09/23/2014 06/04/14   Josue Hector, MD    Physical Exam: Filed Vitals:   09/23/14 2230 09/23/14 2245 09/23/14 2300 09/23/14 2320  BP: 111/56 125/56 120/71 142/61  Pulse: 89 101 101 91  Temp:    98.4 F (36.9 C)  TempSrc:      Resp: 18 23 18 18   Height:      Weight:      SpO2: 93% 96% 96% 95%   General: Not in acute distress HEENT:       Eyes: PERRL, EOMI, no scleral icterus        ENT: No discharge from the ears and nose, no pharynx injection, no tonsillar enlargement.        Neck: No JVD, no bruit, no mass felt. Cardiac: S1/S2, irregularly irregular rhyth,  No murmurs, No gallops or rubs Pulm: Good air movement bilaterally. Clear to auscultation bilaterally. No rales, wheezing, rhonchi or rubs. Abd: Soft, nondistended, nontender, no rebound pain, no organomegaly, BS present Ext: No edema bilaterally. 2+DP/PT pulse bilaterally. Shortening of right leg, externally rotated, tenderness over lateral right hip Musculoskeletal: No joint deformities, erythema, or stiffness, ROM full Skin: No rashes.  Neuro: Alert and oriented X3, cranial nerves II-XII grossly intact, muscle strength 5/5 in all extremeties, sensation to light touch intact.  Psych: Patient is not psychotic, no suicidal or hemocidal ideation.  Labs on Admission:  Basic Metabolic Panel:  Recent Labs Lab 09/23/14 1930  NA 137  K 4.1  CL 100  CO2 26  GLUCOSE 159*  BUN 20  CREATININE 0.79  CALCIUM 9.3   Liver Function Tests: No results for input(s): AST, ALT, ALKPHOS, BILITOT, PROT, ALBUMIN in the last 168 hours. No results for input(s): LIPASE, AMYLASE in the last 168 hours. No results for input(s): AMMONIA in the last 168 hours. CBC:  Recent Labs Lab 09/23/14 1930  WBC 15.6*  NEUTROABS 13.1*  HGB 13.4  HCT 42.4  MCV 79.0  PLT 186   Cardiac Enzymes: No results for input(s): CKTOTAL, CKMB, CKMBINDEX, TROPONINI in the last 168 hours.  BNP (last 3 results) No results for input(s): BNP in the last 8760 hours.  ProBNP (last 3 results) No results for input(s): PROBNP in the last 8760 hours.  CBG: No results for input(s): GLUCAP in the last 168 hours.  Radiological Exams on Admission: Dg Knee 1-2 Views Right  09/23/2014   CLINICAL DATA:  Right hip pain secondary to a fall from bed today.  EXAM: RIGHT KNEE - 1-2 VIEW  COMPARISON:  None.  FINDINGS: There is medial joint space narrowing.  Tiny osteophytes on the patella. No joint effusion. No fracture.  IMPRESSION: No acute abnormality of the knee.   Electronically Signed   By: Lorriane Shire M.D.   On: 09/23/2014 20:53   Ct Head Wo Contrast  09/23/2014   CLINICAL DATA:  Slipped and fell onto right side while sitting on edge of bed. Concern for head injury. Initial encounter.  EXAM: CT HEAD WITHOUT CONTRAST  TECHNIQUE: Contiguous axial images were obtained from the base of the skull through the vertex without intravenous contrast.  COMPARISON:  CT of the head performed 11/25/2007, and MRI of the brain performed 11/26/2007  FINDINGS: There is no evidence of acute infarction, mass lesion, or intra- or extra-axial hemorrhage on CT.  Prominence of the ventricles and sulci reflects moderate cortical volume loss. Cerebellar atrophy  is noted. Mild periventricular and subcortical white matter change likely reflects small vessel ischemic microangiopathy.  The brainstem and fourth ventricle are within normal limits. The basal ganglia are unremarkable in appearance. The cerebral hemispheres demonstrate grossly normal gray-white differentiation. No mass effect or midline shift is seen.  There is no evidence of fracture; a periapical abscess is noted the root of the right first maxillary molar. The orbits are within normal limits. The paranasal sinuses and mastoid air cells are well-aerated. No significant soft tissue abnormalities are seen.  IMPRESSION: 1. No acute intracranial pathology seen on CT. 2. Moderate cortical volume loss and scattered small vessel ischemic microangiopathy. 3. Periapical abscess at the root of the right first maxillary molar.   Electronically Signed   By: Garald Balding M.D.   On: 09/23/2014 20:42   Dg Hip Unilat With Pelvis 2-3 Views Right  09/23/2014   CLINICAL DATA:  Fall from bed with right hip pain  EXAM: RIGHT HIP (WITH PELVIS) 2-3 VIEWS  COMPARISON:  None.  FINDINGS: There is a comminuted intratrochanteric right proximal  femoral fracture with impaction and angulation at the fracture site. Pelvic ring is intact. No other focal abnormality is seen.  IMPRESSION: Intratrochanteric right femoral fracture.   Electronically Signed   By: Inez Catalina M.D.   On: 09/23/2014 20:51   Dg Femur, Min 2 Views Right  09/23/2014   CLINICAL DATA:  Hip pain secondary to a fall off of his bed today.  EXAM: RIGHT FEMUR 2 VIEWS  COMPARISON:  None.  FINDINGS: There is a comminuted intertrochanteric fracture of the proximal right femur. The distal femur is intact. There is osteoarthritis of the knee with medial joint space narrowing.  IMPRESSION: Intertrochanteric fracture of the proximal right femur with slight angulation and displacement.   Electronically Signed   By: Lorriane Shire M.D.   On: 09/23/2014 20:52    EKG: will get one  Assessment/Plan Principal Problem:   Intertrochanteric fracture of right hip Active Problems:   Essential hypertension   Atrial fibrillation   Congestive heart failure   Long term current use of anticoagulant therapy   History of stroke   HLD (hyperlipidemia)   Leukocytosis   Right hip fracture: As evidenced by x-ray. Patient has moderate pain now. No neurovascular compromise. Orthopedic surgeon was consulted, plan to do surgery tomorrow.  - will admit to tele bed - Pain control: morphine prn and percocet - follow up ortho recs - NPO after MN - type and cross - INR/PTT  Systolic congestive heart failure: 2-D echo on 11/27/2007 showed EF 35-40%. It is compensated on admission. Patient is taking hydrochlorothiazide at home. -Continue hydrochlorothiazide, metoprolol, ramipril and aspirin  Hypertension: -Continue hydrochlorothiazide, metoprolol, ramipril  Atrial Fibrillation: CHA2DS2-VASc Score is 6, need oral anticoagulation. Patient is on coumadin at home. INR is 2.29 on admission. Now patient has hip fracture, needs surgery. -will reverse INR. 10 mg vitamine K was ordred by Ed -start IV  heparin bridging given high CHADS2 score -Continue metoprolol.  History of stroke: Stable. -Continue Lipitor and aspirin  Hyperlipidemia: LDL was 104 on 11/26/07 -Continue Lipitor -Check FLP  Leukocytosis: Likely due to stress-induced demargination. Patient does not have signs of infection. -Follow-up CBC  DVT ppx: On IV heparin bridging  Code Status: Full code Family Communication: None at bed side.   Disposition Plan: Admit to inpatient   Date of Service 09/24/2014    Ivor Costa [facility] Pager [phone]  If 7PM-7AM, please contact night-coverage [email or web address] Password  [email or web address] 09/24/2014, 4:11 AM

## 2014-09-23 NOTE — ED Notes (Signed)
Family at bedside. 

## 2014-09-23 NOTE — Progress Notes (Signed)
ANTICOAGULATION CONSULT NOTE - Initial Consult  Pharmacy Consult for Heparin Indication: atrial fibrillation  Allergies  Allergen Reactions  . Penicillins     REACTION: unspecified    Patient Measurements: Height: 5\' 7"  (170.2 cm) Weight: 164 lb (74.39 kg) IBW/kg (Calculated) : 66.1 Heparin Dosing Weight: 74 kg  Vital Signs: Temp: 98.7 F (37.1 C) (03/24 1923) Temp Source: Oral (03/24 1923) BP: 109/83 mmHg (03/24 2215) Pulse Rate: 112 (03/24 2215)  Labs:  Recent Labs  09/23/14 1930  HGB 13.4  HCT 42.4  PLT 186  LABPROT 24.6*  INR 2.20*  CREATININE 0.79    Estimated Creatinine Clearance: 60.8 mL/min (by C-G formula based on Cr of 0.79).   Medical History: Past Medical History  Diagnosis Date  . TIA (transient ischemic attack)   . HLD (hyperlipidemia)   . HTN (hypertension)   . CVA (cerebral vascular accident)   . Atrial fibrillation   . Prostate cancer     Medications:   (Not in a hospital admission) Scheduled:  Infusions:   Assessment: 81yo male with history of Afib on coumadin at home presents following fall with R hip fracture. Pharmacy is consulted to dose heparin for atrial fibrillation. CBC wnl, sCr 0.8, INR 2.2 (vitamin K PO ordered).    Goal of Therapy:  Heparin level 0.3-0.7 units/ml Monitor platelets by anticoagulation protocol: Yes   Plan:  Start heparin infusion at 1100 units/hr Check anti-Xa level in 8 hours and daily while on heparin Continue to monitor H&H and platelets  Andrey Cota. Diona Foley, PharmD Clinical Pharmacist Pager [phone] 09/23/2014,10:49 PM

## 2014-09-23 NOTE — Consult Note (Signed)
Aaron Frye is an 81 y.o. male.    Chief Complaint: right hip pain  HPI: 81 y/o male trying to change his pants, tripped and fell onto right hip. C/o immediate pain to right hip and inability to walk. Denies any other injuries, no chest pain or LOC. No syncope. Denies any previous problems with right hip in the past. Otherwise no other symptoms or issues  PCP:  Stephens Shire, MD  PMH: Past Medical History  Diagnosis Date  . TIA (transient ischemic attack)   . HLD (hyperlipidemia)   . HTN (hypertension)   . CVA (cerebral vascular accident)   . Atrial fibrillation   . Prostate cancer     PSH: History reviewed. No pertinent past surgical history.  Social History:  reports that he has never smoked. He does not have any smokeless tobacco history on file. He reports that he does not drink alcohol or use illicit drugs.  Allergies:  Allergies  Allergen Reactions  . Penicillins     REACTION: unspecified    Medications: No current facility-administered medications for this encounter.   Current Outpatient Prescriptions  Medication Sig Dispense Refill  . aspirin 81 MG tablet Take 81 mg by mouth daily.      [name] atorvastatin (LIPITOR) 40 MG tablet TAKE ONE TABLET BY MOUTH ONCE DAILY 90 tablet 0  . hydrochlorothiazide (MICROZIDE) 12.5 MG capsule TAKE ONE CAPSULE BY MOUTH ONCE DAILY IN THE MORNING 90 capsule 1  . metoprolol succinate (TOPROL-XL) 100 MG 24 hr tablet Take 50 mg by mouth daily. Take with or immediately following a meal.    . ramipril (ALTACE) 5 MG capsule TAKE ONE CAPSULE BY MOUTH ONCE DAILY 30 capsule 5  . warfarin (COUMADIN) 5 MG tablet Take 2.5-5 mg by mouth See admin instructions. Take 57m every day except on Tuesday take 2.566mper patient    . metoprolol succinate (TOPROL-XL) 100 MG 24 hr tablet TAKE ONE-HALF TABLET BY MOUTH ONCE DAILY (Patient not taking: Reported on 09/23/2014) 45 tablet 0  . warfarin (COUMADIN) 5 MG tablet Take as directed by coumadin clinic  (Patient not taking: Reported on 09/23/2014) 100 tablet 0    Results for orders placed or performed during the hospital encounter of 09/23/14 (from the past 48 hour(s))  CBC with Differential     Status: Abnormal   Collection Time: 09/23/14  7:30 PM  Result Value Ref Range   WBC 15.6 (H) 4.0 - 10.5 K/uL   RBC 5.37 4.22 - 5.81 MIL/uL   Hemoglobin 13.4 13.0 - 17.0 g/dL   HCT 42.4 39.0 - 52.0 %   MCV 79.0 78.0 - 100.0 fL   MCH 25.0 (L) 26.0 - 34.0 pg   MCHC 31.6 30.0 - 36.0 g/dL   RDW 15.9 (H) 11.5 - 15.5 %   Platelets 186 150 - 400 K/uL   Neutrophils Relative % 85 (H) 43 - 77 %   Neutro Abs 13.1 (H) 1.7 - 7.7 K/uL   Lymphocytes Relative 7 (L) 12 - 46 %   Lymphs Abs 1.1 0.7 - 4.0 K/uL   Monocytes Relative 7 3 - 12 %   Monocytes Absolute 1.1 (H) 0.1 - 1.0 K/uL   Eosinophils Relative 1 0 - 5 %   Eosinophils Absolute 0.2 0.0 - 0.7 K/uL   Basophils Relative 0 0 - 1 %   Basophils Absolute 0.1 0.0 - 0.1 K/uL  Basic metabolic panel     Status: Abnormal   Collection Time: 09/23/14  7:30 PM  Result Value Ref Range   Sodium 137 135 - 145 mmol/L   Potassium 4.1 3.5 - 5.1 mmol/L   Chloride 100 96 - 112 mmol/L   CO2 26 19 - 32 mmol/L   Glucose, Bld 159 (H) 70 - 99 mg/dL   BUN 20 6 - 23 mg/dL   Creatinine, Ser 0.79 0.50 - 1.35 mg/dL   Calcium 9.3 8.4 - 10.5 mg/dL   GFR calc non Af Amer 78 (L) >90 mL/min   GFR calc Af Amer >90 >90 mL/min    Comment: (NOTE) The eGFR has been calculated using the CKD EPI equation. This calculation has not been validated in all clinical situations. eGFR's persistently <90 mL/min signify possible Chronic Kidney Disease.    Anion gap 11 5 - 15  Protime-INR     Status: Abnormal   Collection Time: 09/23/14  7:30 PM  Result Value Ref Range   Prothrombin Time 24.6 (H) 11.6 - 15.2 seconds   INR 2.20 (H) 0.00 - 1.49  Urinalysis, Routine w reflex microscopic     Status: Abnormal   Collection Time: 09/23/14  8:04 PM  Result Value Ref Range   Color, Urine YELLOW  YELLOW   APPearance CLEAR CLEAR   Specific Gravity, Urine 1.021 1.005 - 1.030   pH 5.5 5.0 - 8.0   Glucose, UA NEGATIVE NEGATIVE mg/dL   Hgb urine dipstick SMALL (A) NEGATIVE   Bilirubin Urine NEGATIVE NEGATIVE   Ketones, ur NEGATIVE NEGATIVE mg/dL   Protein, ur NEGATIVE NEGATIVE mg/dL   Urobilinogen, UA 0.2 0.0 - 1.0 mg/dL   Nitrite POSITIVE (A) NEGATIVE   Leukocytes, UA NEGATIVE NEGATIVE  Urine microscopic-add on     Status: Abnormal   Collection Time: 09/23/14  8:04 PM  Result Value Ref Range   Squamous Epithelial / LPF RARE RARE   WBC, UA 0-2 <3 WBC/hpf   RBC / HPF 3-6 <3 RBC/hpf   Bacteria, UA MANY (A) RARE   Casts HYALINE CASTS (A) NEGATIVE   Dg Knee 1-2 Views Right  09/23/2014   CLINICAL DATA:  Right hip pain secondary to a fall from bed today.  EXAM: RIGHT KNEE - 1-2 VIEW  COMPARISON:  None.  FINDINGS: There is medial joint space narrowing. Tiny osteophytes on the patella. No joint effusion. No fracture.  IMPRESSION: No acute abnormality of the knee.   Electronically Signed   By: Lorriane Shire M.D.   On: 09/23/2014 20:53   Ct Head Wo Contrast  09/23/2014   CLINICAL DATA:  Slipped and fell onto right side while sitting on edge of bed. Concern for head injury. Initial encounter.  EXAM: CT HEAD WITHOUT CONTRAST  TECHNIQUE: Contiguous axial images were obtained from the base of the skull through the vertex without intravenous contrast.  COMPARISON:  CT of the head performed 11/25/2007, and MRI of the brain performed 11/26/2007  FINDINGS: There is no evidence of acute infarction, mass lesion, or intra- or extra-axial hemorrhage on CT.  Prominence of the ventricles and sulci reflects moderate cortical volume loss. Cerebellar atrophy is noted. Mild periventricular and subcortical white matter change likely reflects small vessel ischemic microangiopathy.  The brainstem and fourth ventricle are within normal limits. The basal ganglia are unremarkable in appearance. The cerebral hemispheres  demonstrate grossly normal gray-white differentiation. No mass effect or midline shift is seen.  There is no evidence of fracture; a periapical abscess is noted the root of the right first maxillary molar. The orbits are within normal limits. The paranasal  sinuses and mastoid air cells are well-aerated. No significant soft tissue abnormalities are seen.  IMPRESSION: 1. No acute intracranial pathology seen on CT. 2. Moderate cortical volume loss and scattered small vessel ischemic microangiopathy. 3. Periapical abscess at the root of the right first maxillary molar.   Electronically Signed   By: Garald Balding M.D.   On: 09/23/2014 20:42   Dg Hip Unilat With Pelvis 2-3 Views Right  09/23/2014   CLINICAL DATA:  Fall from bed with right hip pain  EXAM: RIGHT HIP (WITH PELVIS) 2-3 VIEWS  COMPARISON:  None.  FINDINGS: There is a comminuted intratrochanteric right proximal femoral fracture with impaction and angulation at the fracture site. Pelvic ring is intact. No other focal abnormality is seen.  IMPRESSION: Intratrochanteric right femoral fracture.   Electronically Signed   By: Inez Catalina M.D.   On: 09/23/2014 20:51   Dg Femur, Min 2 Views Right  09/23/2014   CLINICAL DATA:  Hip pain secondary to a fall off of his bed today.  EXAM: RIGHT FEMUR 2 VIEWS  COMPARISON:  None.  FINDINGS: There is a comminuted intertrochanteric fracture of the proximal right femur. The distal femur is intact. There is osteoarthritis of the knee with medial joint space narrowing.  IMPRESSION: Intertrochanteric fracture of the proximal right femur with slight angulation and displacement.   Electronically Signed   By: Lorriane Shire M.D.   On: 09/23/2014 20:52    ROS: ROS Right hip pain and inability to ambulate Hard of hearing  Physical Exam: Alert and appropriate 81 y/o male in no acute distress Bilateral upper extremities with full rom no tenderness or deformity Right lower extremity with external rotation and  shortening Painful to right pelvis Left lower extremity with full rom  No tenderness or deformity No other signs of injury or deformity Physical Exam   Assessment/Plan Assessment: right intertroch femur fracture  Plan Medicine team to admit Plan for surgery tomorrow if INR improves Will treat with vitamin k and make NPO after tomorrow Strict non weight bearing Pain control as needed

## 2014-09-23 NOTE — ED Provider Notes (Signed)
CSN: [id number]     Arrival date & time 09/23/14  1905 History   First MD Initiated Contact with Patient 09/23/14 1922     Chief Complaint  Patient presents with  . Fall  . Hip Pain     (Consider location/radiation/quality/duration/timing/severity/associated sxs/prior Treatment) HPI  Aaron Frye is a 81 year old male with past medical history of TIA, hyperlipidemia, hypertension, previous CVA, A. fib, on Coumadin. Comes in today after mechanical fall with right hip pain.  Patient was leaning against his bed putting on his pants, when the bed pushed backwards and he fell onto his right side. After that he had pain in his right hip and was unable to stand. The pain is worse with movement, better with rest, severe in intensity, achy, it was noticed that he has some shortening of that extremity by EMS.   Past Medical History  Diagnosis Date  . TIA (transient ischemic attack)   . HLD (hyperlipidemia)   . HTN (hypertension)   . CVA (cerebral vascular accident)   . Atrial fibrillation   . Prostate cancer    History reviewed. No pertinent past surgical history. No family history on file. History  Substance Use Topics  . Smoking status: Never Smoker   . Smokeless tobacco: Not on file  . Alcohol Use: No    Review of Systems  Constitutional: Negative for fever and chills.  Eyes: Negative for redness.  Respiratory: Negative for cough and shortness of breath.   Cardiovascular: Negative for chest pain.  Gastrointestinal: Negative for nausea, vomiting, abdominal pain and diarrhea.  Genitourinary: Negative for dysuria.  Musculoskeletal:       Right hip pain  Skin: Negative for rash.  Neurological: Negative for headaches.  All other systems reviewed and are negative.     Allergies  Penicillins  Home Medications   Prior to Admission medications   Medication Sig Start Date End Date Taking? Authorizing Provider  aspirin 81 MG tablet Take 81 mg by mouth daily.     Yes  Historical Provider, MD  atorvastatin (LIPITOR) 40 MG tablet TAKE ONE TABLET BY MOUTH ONCE DAILY 07/29/14  Yes Josue Hector, MD  hydrochlorothiazide (MICROZIDE) 12.5 MG capsule TAKE ONE CAPSULE BY MOUTH ONCE DAILY IN THE MORNING 05/01/14  Yes Josue Hector, MD  metoprolol succinate (TOPROL-XL) 100 MG 24 hr tablet Take 50 mg by mouth daily. Take with or immediately following a meal.   Yes Historical Provider, MD  ramipril (ALTACE) 5 MG capsule TAKE ONE CAPSULE BY MOUTH ONCE DAILY 08/24/14  Yes Josue Hector, MD  warfarin (COUMADIN) 5 MG tablet Take 2.5-5 mg by mouth See admin instructions. Take 5mg  every day except on Tuesday take 2.5mg  per patient   Yes Historical Provider, MD  metoprolol succinate (TOPROL-XL) 100 MG 24 hr tablet TAKE ONE-HALF TABLET BY MOUTH ONCE DAILY Patient not taking: Reported on 09/23/2014 09/20/14   Josue Hector, MD  warfarin (COUMADIN) 5 MG tablet Take as directed by coumadin clinic Patient not taking: Reported on 09/23/2014 06/04/14   Josue Hector, MD   BP 117/61 mmHg  Pulse 86  Temp(Src) 98.7 F (37.1 C) (Oral)  Resp 17  Ht 5\' 7"  (1.702 m)  Wt 164 lb (74.39 kg)  BMI 25.68 kg/m2  SpO2 96% Physical Exam  Constitutional: He is oriented to person, place, and time. No distress.  HENT:  Head: Normocephalic and atraumatic.  Eyes: EOM are normal. Pupils are equal, round, and reactive to light.  Neck: Normal range  of motion. Neck supple.  Cardiovascular: Normal rate.   Pulmonary/Chest: Effort normal. No respiratory distress.  Abdominal: Soft. There is no tenderness.  Musculoskeletal:  Right leg appears shortened.  Normal sensation/motor function in the right foot.  Good cap refill.  No ttp of the c/t/l spine  Neurological: He is alert and oriented to person, place, and time.  Skin: No rash noted. He is not diaphoretic.  Psychiatric: He has a normal mood and affect.    ED Course  Procedures (including critical care time) Labs Review Labs Reviewed  CBC WITH  DIFFERENTIAL/PLATELET - Abnormal; Notable for the following:    WBC 15.6 (*)    MCH 25.0 (*)    RDW 15.9 (*)    Neutrophils Relative % 85 (*)    Neutro Abs 13.1 (*)    Lymphocytes Relative 7 (*)    Monocytes Absolute 1.1 (*)    All other components within normal limits  BASIC METABOLIC PANEL - Abnormal; Notable for the following:    Glucose, Bld 159 (*)    GFR calc non Af Amer 78 (*)    All other components within normal limits  PROTIME-INR - Abnormal; Notable for the following:    Prothrombin Time 24.6 (*)    INR 2.20 (*)    All other components within normal limits  URINALYSIS, ROUTINE W REFLEX MICROSCOPIC - Abnormal; Notable for the following:    Hgb urine dipstick SMALL (*)    Nitrite POSITIVE (*)    All other components within normal limits  URINE MICROSCOPIC-ADD ON - Abnormal; Notable for the following:    Bacteria, UA MANY (*)    Casts HYALINE CASTS (*)    All other components within normal limits  URINE CULTURE    Imaging Review Dg Knee 1-2 Views Right  09/23/2014   CLINICAL DATA:  Right hip pain secondary to a fall from bed today.  EXAM: RIGHT KNEE - 1-2 VIEW  COMPARISON:  None.  FINDINGS: There is medial joint space narrowing. Tiny osteophytes on the patella. No joint effusion. No fracture.  IMPRESSION: No acute abnormality of the knee.   Electronically Signed   By: Lorriane Shire M.D.   On: 09/23/2014 20:53   Ct Head Wo Contrast  09/23/2014   CLINICAL DATA:  Slipped and fell onto right side while sitting on edge of bed. Concern for head injury. Initial encounter.  EXAM: CT HEAD WITHOUT CONTRAST  TECHNIQUE: Contiguous axial images were obtained from the base of the skull through the vertex without intravenous contrast.  COMPARISON:  CT of the head performed 11/25/2007, and MRI of the brain performed 11/26/2007  FINDINGS: There is no evidence of acute infarction, mass lesion, or intra- or extra-axial hemorrhage on CT.  Prominence of the ventricles and sulci reflects moderate  cortical volume loss. Cerebellar atrophy is noted. Mild periventricular and subcortical white matter change likely reflects small vessel ischemic microangiopathy.  The brainstem and fourth ventricle are within normal limits. The basal ganglia are unremarkable in appearance. The cerebral hemispheres demonstrate grossly normal gray-white differentiation. No mass effect or midline shift is seen.  There is no evidence of fracture; a periapical abscess is noted the root of the right first maxillary molar. The orbits are within normal limits. The paranasal sinuses and mastoid air cells are well-aerated. No significant soft tissue abnormalities are seen.  IMPRESSION: 1. No acute intracranial pathology seen on CT. 2. Moderate cortical volume loss and scattered small vessel ischemic microangiopathy. 3. Periapical abscess at the root  of the right first maxillary molar.   Electronically Signed   By: Garald Balding M.D.   On: 09/23/2014 20:42   Dg Hip Unilat With Pelvis 2-3 Views Right  09/23/2014   CLINICAL DATA:  Fall from bed with right hip pain  EXAM: RIGHT HIP (WITH PELVIS) 2-3 VIEWS  COMPARISON:  None.  FINDINGS: There is a comminuted intratrochanteric right proximal femoral fracture with impaction and angulation at the fracture site. Pelvic ring is intact. No other focal abnormality is seen.  IMPRESSION: Intratrochanteric right femoral fracture.   Electronically Signed   By: Inez Catalina M.D.   On: 09/23/2014 20:51   Dg Femur, Min 2 Views Right  09/23/2014   CLINICAL DATA:  Hip pain secondary to a fall off of his bed today.  EXAM: RIGHT FEMUR 2 VIEWS  COMPARISON:  None.  FINDINGS: There is a comminuted intertrochanteric fracture of the proximal right femur. The distal femur is intact. There is osteoarthritis of the knee with medial joint space narrowing.  IMPRESSION: Intertrochanteric fracture of the proximal right femur with slight angulation and displacement.   Electronically Signed   By: Lorriane Shire M.D.    On: 09/23/2014 20:52     EKG Interpretation None      MDM   Final diagnoses:  Hip pain    Aaron Frye is a 81 year old male with past medical history of TIA, hyperlipidemia, hypertension, previous CVA, A. fib, on Coumadin. Comes in today after mechanical fall with right hip pain.  Exam as above.  The right leg is NVI.  There is no pain below the right femur.  There is no ttp of the left leg.  There is no ttp of the c/t/l spine.  There is no chest wall ttp or abdominal ttp.  VSS.  Plain films of the right hip show a intertrochanteric frx.  Ortho consulted and will admit to medicine. UA obtained and shows + nitrite but patient denies sx of dysuria/abdominal pain/fever.  Will hold off on treating.  Patient admitted to hospitalist service.  Patient given vit K to reverse coumadin.  Head CT obtained bc of fall on coumadin and was neg for injury.    Jarome Matin, MD 09/23/14 1683  Pattricia Boss, MD 10/05/14 [phone]

## 2014-09-24 ENCOUNTER — Inpatient Hospital Stay (HOSPITAL_COMMUNITY): Payer: Medicare Other

## 2014-09-24 ENCOUNTER — Encounter (HOSPITAL_COMMUNITY)
Admission: EM | Disposition: A | Discharge status: Rehabilitation Facility | Payer: Self-pay | Source: Home / Self Care | Attending: Internal Medicine

## 2014-09-24 ENCOUNTER — Inpatient Hospital Stay (HOSPITAL_COMMUNITY): Payer: Medicare Other | Admitting: Certified Registered Nurse Anesthetist

## 2014-09-24 DIAGNOSIS — R8290 Unspecified abnormal findings in urine: Secondary | ICD-10-CM

## 2014-09-24 DIAGNOSIS — I5022 Chronic systolic (congestive) heart failure: Secondary | ICD-10-CM

## 2014-09-24 DIAGNOSIS — D72829 Elevated white blood cell count, unspecified: Secondary | ICD-10-CM

## 2014-09-24 DIAGNOSIS — S72141A Displaced intertrochanteric fracture of right femur, initial encounter for closed fracture: Secondary | ICD-10-CM | POA: Diagnosis present

## 2014-09-24 DIAGNOSIS — I482 Chronic atrial fibrillation: Secondary | ICD-10-CM

## 2014-09-24 DIAGNOSIS — Z01818 Encounter for other preprocedural examination: Secondary | ICD-10-CM

## 2014-09-24 HISTORY — PX: INTRAMEDULLARY (IM) NAIL INTERTROCHANTERIC: SHX5875

## 2014-09-24 LAB — LIPID PANEL
CHOLESTEROL: 97 mg/dL (ref 0–200)
HDL: 28 mg/dL — ABNORMAL LOW (ref >39)
LDL Cholesterol: 58 mg/dL (ref 0–99)
TRIGLYCERIDES: 53 mg/dL (ref <150)
Total CHOL/HDL Ratio: 3.5 RATIO
VLDL: 11 mg/dL (ref 0–40)

## 2014-09-24 LAB — BASIC METABOLIC PANEL
Anion gap: 6 (ref 5–15)
BUN: 18 mg/dL (ref 6–23)
CHLORIDE: 105 mmol/L (ref 96–112)
CO2: 26 mmol/L (ref 19–32)
Calcium: 8.7 mg/dL (ref 8.4–10.5)
Creatinine, Ser: 0.78 mg/dL (ref 0.50–1.35)
GFR calc Af Amer: >90 mL/min (ref >90)
GFR calc non Af Amer: 79 mL/min — ABNORMAL LOW (ref >90)
GLUCOSE: 169 mg/dL — AB (ref 70–99)
Potassium: 3.9 mmol/L (ref 3.5–5.1)
Sodium: 137 mmol/L (ref 135–145)

## 2014-09-24 LAB — ABO/RH: ABO/RH(D): O POS

## 2014-09-24 LAB — TYPE AND SCREEN
ABO/RH(D): O POS
Antibody Screen: NEGATIVE

## 2014-09-24 LAB — CBC
HCT: 36.9 % — ABNORMAL LOW (ref 39.0–52.0)
Hemoglobin: 11.8 g/dL — ABNORMAL LOW (ref 13.0–17.0)
MCH: 25.3 pg — ABNORMAL LOW (ref 26.0–34.0)
MCHC: 32 g/dL (ref 30.0–36.0)
MCV: 79 fL (ref 78.0–100.0)
PLATELETS: 184 10*3/uL (ref 150–400)
RBC: 4.67 MIL/uL (ref 4.22–5.81)
RDW: 16.1 % — ABNORMAL HIGH (ref 11.5–15.5)
WBC: 14.5 10*3/uL — ABNORMAL HIGH (ref 4.0–10.5)

## 2014-09-24 LAB — SURGICAL PCR SCREEN
MRSA, PCR: NEGATIVE
Staphylococcus aureus: NEGATIVE

## 2014-09-24 LAB — PROTIME-INR
INR: 1.89 — AB (ref 0.00–1.49)
PROTHROMBIN TIME: 21.9 s — AB (ref 11.6–15.2)

## 2014-09-24 LAB — APTT: aPTT: 33 seconds (ref 24–37)

## 2014-09-24 LAB — GLUCOSE, CAPILLARY: GLUCOSE-CAPILLARY: 149 mg/dL — AB (ref 70–99)

## 2014-09-24 SURGERY — FIXATION, FRACTURE, INTERTROCHANTERIC, WITH INTRAMEDULLARY ROD
Anesthesia: General | Site: Hip | Laterality: Right

## 2014-09-24 MED ORDER — ONDANSETRON HCL 4 MG/2ML IJ SOLN: 4.0000 mg | Freq: Once | INTRAMUSCULAR | Status: DC | PRN

## 2014-09-24 MED ORDER — GLYCOPYRROLATE 0.2 MG/ML IJ SOLN
INTRAMUSCULAR | Status: DC | PRN
  Administered 2014-09-24: 0.4 mg via INTRAVENOUS

## 2014-09-24 MED ORDER — 0.9 % SODIUM CHLORIDE (POUR BTL) OPTIME
TOPICAL | Status: DC | PRN
  Administered 2014-09-24: 1000 mL

## 2014-09-24 MED ORDER — ARTIFICIAL TEARS OP OINT
TOPICAL_OINTMENT | OPHTHALMIC | Status: AC
  Filled 2014-09-24: qty 3.5

## 2014-09-24 MED ORDER — ONDANSETRON HCL 4 MG/2ML IJ SOLN: 4.0000 mg | Freq: Four times a day (QID) | INTRAMUSCULAR | Status: DC | PRN

## 2014-09-24 MED ORDER — ESMOLOL HCL 10 MG/ML IV SOLN
INTRAVENOUS | Status: AC
  Filled 2014-09-24: qty 10

## 2014-09-24 MED ORDER — NEOSTIGMINE METHYLSULFATE 10 MG/10ML IV SOLN
INTRAVENOUS | Status: AC
  Filled 2014-09-24: qty 2

## 2014-09-24 MED ORDER — ESMOLOL HCL 10 MG/ML IV SOLN
INTRAVENOUS | Status: DC | PRN
  Administered 2014-09-24 (×2): 20 mg via INTRAVENOUS

## 2014-09-24 MED ORDER — ACETAMINOPHEN 650 MG RE SUPP: 650.0000 mg | Freq: Four times a day (QID) | RECTAL | Status: DC | PRN

## 2014-09-24 MED ORDER — OXYCODONE HCL 5 MG PO TABS: 5.0000 mg | ORAL_TABLET | Freq: Once | ORAL | Status: DC | PRN

## 2014-09-24 MED ORDER — VANCOMYCIN HCL 1000 MG IV SOLR
INTRAVENOUS | Status: AC
  Filled 2014-09-24: qty 1000

## 2014-09-24 MED ORDER — FENTANYL CITRATE 0.05 MG/ML IJ SOLN
INTRAMUSCULAR | Status: AC
  Filled 2014-09-24: qty 5

## 2014-09-24 MED ORDER — SENNA 8.6 MG PO TABS
2.0000 | ORAL_TABLET | Freq: Every day | ORAL | Status: DC
  Administered 2014-09-24 – 2014-09-26 (×3): 17.2 mg via ORAL
  Filled 2014-09-24 (×3): qty 2

## 2014-09-24 MED ORDER — ROCURONIUM BROMIDE 100 MG/10ML IV SOLN
INTRAVENOUS | Status: DC | PRN
  Administered 2014-09-24: 40 mg via INTRAVENOUS

## 2014-09-24 MED ORDER — WARFARIN SODIUM 5 MG PO TABS
5.0000 mg | ORAL_TABLET | Freq: Once | ORAL | Status: AC
  Administered 2014-09-24: 5 mg via ORAL
  Filled 2014-09-24 (×3): qty 1

## 2014-09-24 MED ORDER — HYDROMORPHONE HCL 1 MG/ML IJ SOLN: 0.2500 mg | INTRAMUSCULAR | Status: DC | PRN

## 2014-09-24 MED ORDER — WARFARIN - PHARMACIST DOSING INPATIENT: Freq: Every day | Status: DC

## 2014-09-24 MED ORDER — FENTANYL CITRATE 0.05 MG/ML IJ SOLN
INTRAMUSCULAR | Status: DC | PRN
  Administered 2014-09-24 (×3): 50 ug via INTRAVENOUS

## 2014-09-24 MED ORDER — PHENYLEPHRINE HCL 10 MG/ML IJ SOLN
10.0000 mg | INTRAMUSCULAR | Status: DC | PRN
  Administered 2014-09-24: 25 ug/min via INTRAVENOUS

## 2014-09-24 MED ORDER — EPHEDRINE SULFATE 50 MG/ML IJ SOLN
INTRAMUSCULAR | Status: DC | PRN
  Administered 2014-09-24 (×3): 5 mg via INTRAVENOUS

## 2014-09-24 MED ORDER — HYDROCODONE-ACETAMINOPHEN 5-325 MG PO TABS
1.0000 | ORAL_TABLET | Freq: Four times a day (QID) | ORAL | Status: DC | PRN
  Administered 2014-09-24 – 2014-09-25 (×2): 1 via ORAL
  Administered 2014-09-26 – 2014-09-27 (×3): 2 via ORAL
  Filled 2014-09-24 (×2): qty 2
  Filled 2014-09-24 (×2): qty 1
  Filled 2014-09-24: qty 2

## 2014-09-24 MED ORDER — LACTATED RINGERS IV SOLN
INTRAVENOUS | Status: DC | PRN
  Administered 2014-09-24 (×2): via INTRAVENOUS

## 2014-09-24 MED ORDER — MENTHOL 3 MG MT LOZG: 1.0000 | LOZENGE | OROMUCOSAL | Status: DC | PRN

## 2014-09-24 MED ORDER — PHENYLEPHRINE HCL 10 MG/ML IJ SOLN
INTRAMUSCULAR | Status: DC | PRN
  Administered 2014-09-24: 80 ug via INTRAVENOUS
  Administered 2014-09-24: 40 ug via INTRAVENOUS
  Administered 2014-09-24: 80 ug via INTRAVENOUS
  Administered 2014-09-24: 40 ug via INTRAVENOUS
  Administered 2014-09-24 (×3): 80 ug via INTRAVENOUS

## 2014-09-24 MED ORDER — LACTATED RINGERS IV SOLN
INTRAVENOUS | Status: DC
  Administered 2014-09-24: 11:00:00 via INTRAVENOUS

## 2014-09-24 MED ORDER — CIPROFLOXACIN IN D5W 400 MG/200ML IV SOLN
400.0000 mg | Freq: Two times a day (BID) | INTRAVENOUS | Status: DC
  Administered 2014-09-24 – 2014-09-25 (×2): 400 mg via INTRAVENOUS
  Filled 2014-09-24 (×4): qty 200

## 2014-09-24 MED ORDER — CLINDAMYCIN PHOSPHATE 600 MG/50ML IV SOLN
600.0000 mg | Freq: Four times a day (QID) | INTRAVENOUS | Status: AC
  Administered 2014-09-24 – 2014-09-25 (×2): 600 mg via INTRAVENOUS
  Filled 2014-09-24 (×2): qty 50

## 2014-09-24 MED ORDER — ALBUMIN HUMAN 5 % IV SOLN
INTRAVENOUS | Status: DC | PRN
  Administered 2014-09-24: 14:00:00 via INTRAVENOUS

## 2014-09-24 MED ORDER — METOCLOPRAMIDE HCL 5 MG PO TABS: 5.0000 mg | ORAL_TABLET | Freq: Three times a day (TID) | ORAL | Status: DC | PRN

## 2014-09-24 MED ORDER — ONDANSETRON HCL 4 MG/2ML IJ SOLN
INTRAMUSCULAR | Status: AC
  Filled 2014-09-24: qty 2

## 2014-09-24 MED ORDER — METOCLOPRAMIDE HCL 5 MG/ML IJ SOLN: 5.0000 mg | Freq: Three times a day (TID) | INTRAMUSCULAR | Status: DC | PRN

## 2014-09-24 MED ORDER — PHENYLEPHRINE 40 MCG/ML (10ML) SYRINGE FOR IV PUSH (FOR BLOOD PRESSURE SUPPORT)
PREFILLED_SYRINGE | INTRAVENOUS | Status: AC
  Filled 2014-09-24: qty 10

## 2014-09-24 MED ORDER — PHENYLEPHRINE HCL 10 MG/ML IJ SOLN
INTRAMUSCULAR | Status: AC
Start: 2014-09-24 — End: 2014-09-24
  Filled 2014-09-24: qty 1

## 2014-09-24 MED ORDER — NEOSTIGMINE METHYLSULFATE 10 MG/10ML IV SOLN
INTRAVENOUS | Status: DC | PRN
  Administered 2014-09-24: 3 mg via INTRAVENOUS

## 2014-09-24 MED ORDER — ONDANSETRON HCL 4 MG/2ML IJ SOLN
INTRAMUSCULAR | Status: AC
  Filled 2014-09-24: qty 4

## 2014-09-24 MED ORDER — LIDOCAINE HCL (CARDIAC) 20 MG/ML IV SOLN
INTRAVENOUS | Status: DC | PRN
  Administered 2014-09-24: 60 mg via INTRAVENOUS

## 2014-09-24 MED ORDER — MORPHINE SULFATE 2 MG/ML IJ SOLN
0.5000 mg | INTRAMUSCULAR | Status: DC | PRN
  Administered 2014-09-24 – 2014-09-25 (×2): 0.5 mg via INTRAVENOUS
  Filled 2014-09-24 (×2): qty 1

## 2014-09-24 MED ORDER — EPHEDRINE SULFATE 50 MG/ML IJ SOLN
INTRAMUSCULAR | Status: AC
  Filled 2014-09-24: qty 1

## 2014-09-24 MED ORDER — PROPOFOL 10 MG/ML IV BOLUS
INTRAVENOUS | Status: AC
  Filled 2014-09-24: qty 20

## 2014-09-24 MED ORDER — VANCOMYCIN HCL IN DEXTROSE 1-5 GM/200ML-% IV SOLN
INTRAVENOUS | Status: AC
  Filled 2014-09-24: qty 200

## 2014-09-24 MED ORDER — OXYCODONE HCL 5 MG/5ML PO SOLN: 5.0000 mg | Freq: Once | ORAL | Status: DC | PRN

## 2014-09-24 MED ORDER — LIDOCAINE HCL (CARDIAC) 20 MG/ML IV SOLN
INTRAVENOUS | Status: AC
  Filled 2014-09-24: qty 15

## 2014-09-24 MED ORDER — ONDANSETRON HCL 4 MG/2ML IJ SOLN
INTRAMUSCULAR | Status: DC | PRN
  Administered 2014-09-24: 4 mg via INTRAVENOUS

## 2014-09-24 MED ORDER — ENOXAPARIN SODIUM 40 MG/0.4ML ~~LOC~~ SOLN
40.0000 mg | SUBCUTANEOUS | Status: DC
  Administered 2014-09-25: 40 mg via SUBCUTANEOUS
  Filled 2014-09-24: qty 0.4

## 2014-09-24 MED ORDER — ACETAMINOPHEN 325 MG PO TABS: 650.0000 mg | ORAL_TABLET | Freq: Four times a day (QID) | ORAL | Status: DC | PRN

## 2014-09-24 MED ORDER — DOCUSATE SODIUM 100 MG PO CAPS
100.0000 mg | ORAL_CAPSULE | Freq: Two times a day (BID) | ORAL | Status: DC
  Administered 2014-09-24 – 2014-09-27 (×6): 100 mg via ORAL
  Filled 2014-09-24 (×6): qty 1

## 2014-09-24 MED ORDER — PHENOL 1.4 % MT LIQD: 1.0000 | OROMUCOSAL | Status: DC | PRN

## 2014-09-24 MED ORDER — VANCOMYCIN HCL 1000 MG IV SOLR
1000.0000 mg | INTRAVENOUS | Status: DC | PRN
  Administered 2014-09-24: 1000 mg via INTRAVENOUS

## 2014-09-24 MED ORDER — ONDANSETRON HCL 4 MG PO TABS: 4.0000 mg | ORAL_TABLET | Freq: Four times a day (QID) | ORAL | Status: DC | PRN

## 2014-09-24 MED ORDER — STERILE WATER FOR INJECTION IJ SOLN
INTRAMUSCULAR | Status: AC
  Filled 2014-09-24: qty 10

## 2014-09-24 MED ORDER — GLYCOPYRROLATE 0.2 MG/ML IJ SOLN
INTRAMUSCULAR | Status: AC
  Filled 2014-09-24: qty 3

## 2014-09-24 MED ORDER — PROPOFOL 10 MG/ML IV BOLUS
INTRAVENOUS | Status: DC | PRN
  Administered 2014-09-24: 100 mg via INTRAVENOUS

## 2014-09-24 SURGICAL SUPPLY — 50 items
ALCOHOL ISOPROPYL (RUBBING) (MISCELLANEOUS) ×3 IMPLANT
BIT DRILL 4.3MMS DISTAL GRDTED (BIT) ×1 IMPLANT
BNDG COHESIVE 4X5 TAN STRL (GAUZE/BANDAGES/DRESSINGS) ×3 IMPLANT
CHLORAPREP W/TINT 26ML (MISCELLANEOUS) ×3 IMPLANT
COVER PERINEAL POST (MISCELLANEOUS) ×3 IMPLANT
COVER SURGICAL LIGHT HANDLE (MISCELLANEOUS) ×3 IMPLANT
DERMABOND ADVANCED (GAUZE/BANDAGES/DRESSINGS) ×4
DERMABOND ADVANCED .7 DNX12 (GAUZE/BANDAGES/DRESSINGS) ×2 IMPLANT
DRAPE C-ARM 42X72 X-RAY (DRAPES) ×3 IMPLANT
DRAPE C-ARMOR (DRAPES) ×3 IMPLANT
DRAPE IMP U-DRAPE 54X76 (DRAPES) ×6 IMPLANT
DRAPE INCISE IOBAN 85X60 (DRAPES) ×9 IMPLANT
DRAPE UNIVERSAL PACK (DRAPES) ×3 IMPLANT
DRILL 4.3MMS DISTAL GRADUATED (BIT) ×3
DRSG MEPILEX BORDER 4X4 (GAUZE/BANDAGES/DRESSINGS) ×9 IMPLANT
DRSG PAD ABDOMINAL 8X10 ST (GAUZE/BANDAGES/DRESSINGS) IMPLANT
ELECT REM PT RETURN 9FT ADLT (ELECTROSURGICAL) ×3
ELECTRODE REM PT RTRN 9FT ADLT (ELECTROSURGICAL) ×1 IMPLANT
FACESHIELD STD STERILE (MASK) ×3 IMPLANT
GLOVE BIO SURGEON STRL SZ 6.5 (GLOVE) ×2 IMPLANT
GLOVE BIO SURGEONS STRL SZ 6.5 (GLOVE) ×1
GLOVE BIOGEL PI IND STRL 8.5 (GLOVE) ×2 IMPLANT
GLOVE BIOGEL PI INDICATOR 8.5 (GLOVE) ×4
GLOVE SURG ORTHO 8.5 STRL (GLOVE) ×12 IMPLANT
GLOVE SURG SS PI 8.0 STRL IVOR (GLOVE) ×3 IMPLANT
GOWN STRL REUS W/ TWL LRG LVL3 (GOWN DISPOSABLE) ×2 IMPLANT
GOWN STRL REUS W/TWL 2XL LVL3 (GOWN DISPOSABLE) ×3 IMPLANT
GOWN STRL REUS W/TWL LRG LVL3 (GOWN DISPOSABLE) ×4
GUIDEPIN 3.2X17.5 THRD DISP (PIN) ×6 IMPLANT
GUIDEWIRE BALL NOSE 80CM (WIRE) ×6 IMPLANT
HIP FRAC NAIL LAG SCR 10.5X100 (Orthopedic Implant) ×2 IMPLANT
KIT ROOM TURNOVER OR (KITS) ×3 IMPLANT
MARKER SKIN DUAL TIP RULER LAB (MISCELLANEOUS) ×3 IMPLANT
NAIL HIP FX RT 11X400-125 (Nail) ×3 IMPLANT
NS IRRIG 1000ML POUR BTL (IV SOLUTION) ×3 IMPLANT
PACK GENERAL/GYN (CUSTOM PROCEDURE TRAY) ×3 IMPLANT
PAD ARMBOARD 7.5X6 YLW CONV (MISCELLANEOUS) ×3 IMPLANT
PADDING CAST ABS 4INX4YD NS (CAST SUPPLIES) ×4
PADDING CAST ABS COTTON 4X4 ST (CAST SUPPLIES) ×2 IMPLANT
SCREW BONE CORTICAL 5.0X40 (Screw) ×3 IMPLANT
SCREW CANN THRD AFF 10.5X100 (Orthopedic Implant) ×1 IMPLANT
SCREWDRIVER HEX TIP 3.5MM (MISCELLANEOUS) ×3 IMPLANT
SUT MNCRL AB 3-0 PS2 18 (SUTURE) ×3 IMPLANT
SUT MNCRL AB 3-0 PS2 27 (SUTURE) ×3 IMPLANT
SUT MON AB 2-0 CT1 27 (SUTURE) ×3 IMPLANT
SUT MON AB 2-0 CT1 36 (SUTURE) ×3 IMPLANT
SUT VIC AB 1 CT1 27 (SUTURE) ×2
SUT VIC AB 1 CT1 27XBRD ANBCTR (SUTURE) ×1 IMPLANT
TOWEL OR 17X24 6PK STRL BLUE (TOWEL DISPOSABLE) ×3 IMPLANT
TOWEL OR 17X26 10 PK STRL BLUE (TOWEL DISPOSABLE) ×3 IMPLANT

## 2014-09-24 NOTE — Transfer of Care (Signed)
Immediate Anesthesia Transfer of Care Note  Patient: Aaron Frye  Procedure(s) Performed: Procedure(s): INTRAMEDULLARY (IM) NAIL INTERTROCHANTRIC (Right)  Patient Location: PACU  Anesthesia Type:General  Level of Consciousness: sedated  Airway & Oxygen Therapy: Patient Spontanous Breathing and Patient connected to nasal cannula oxygen  Post-op Assessment: Report given to RN and Post -op Vital signs reviewed and stable  Post vital signs: Reviewed and stable  Last Vitals:  Filed Vitals:   09/24/14 1108  BP: 132/60  Pulse: 86  Temp:   Resp: 18    Complications: No apparent anesthesia complications

## 2014-09-24 NOTE — Anesthesia Procedure Notes (Signed)
Procedure Name: Intubation Date/Time: 09/24/2014 11:50 AM Performed by: [place] Pink Pre-anesthesia Checklist: Patient identified, Emergency Drugs available, Suction available, Patient being monitored and Timeout performed Patient Re-evaluated:Patient Re-evaluated prior to inductionOxygen Delivery Method: Circle system utilized Preoxygenation: Pre-oxygenation with 100% oxygen Intubation Type: IV induction Ventilation: Mask ventilation without difficulty Laryngoscope Size: Miller and 2 Grade View: Grade II Tube type: Oral Tube size: 7.5 mm Number of attempts: 1 Airway Equipment and Method: Stylet Placement Confirmation: ETT inserted through vocal cords under direct vision,  positive ETCO2 and breath sounds checked- equal and bilateral Secured at: 23 cm Tube secured with: Tape Dental Injury: Teeth and Oropharynx as per pre-operative assessment

## 2014-09-24 NOTE — Progress Notes (Signed)
Patient required to remove gold wedding band before surgery. Band placed in a plastic bag and in patient's nightstand draw (4J09).

## 2014-09-24 NOTE — Progress Notes (Signed)
Pt seen and examined. Given PO Vit K yesterday for INR 2.2.  INR 1.89 today  Plan for OR today. Resume coumadin tonight.

## 2014-09-24 NOTE — H&P (View-Only) (Signed)
Aaron Frye is an 81 y.o. male.    Chief Complaint: right hip pain  HPI: 81 y/o male trying to change his pants, tripped and fell onto right hip. C/o immediate pain to right hip and inability to walk. Denies any other injuries, no chest pain or LOC. No syncope. Denies any previous problems with right hip in the past. Otherwise no other symptoms or issues  PCP:  BURNETT,BRENT A, MD  PMH: Past Medical History  Diagnosis Date  . TIA (transient ischemic attack)   . HLD (hyperlipidemia)   . HTN (hypertension)   . CVA (cerebral vascular accident)   . Atrial fibrillation   . Prostate cancer     PSH: History reviewed. No pertinent past surgical history.  Social History:  reports that he has never smoked. He does not have any smokeless tobacco history on file. He reports that he does not drink alcohol or use illicit drugs.  Allergies:  Allergies  Allergen Reactions  . Penicillins     REACTION: unspecified    Medications: No current facility-administered medications for this encounter.   Current Outpatient Prescriptions  Medication Sig Dispense Refill  . aspirin 81 MG tablet Take 81 mg by mouth daily.      . atorvastatin (LIPITOR) 40 MG tablet TAKE ONE TABLET BY MOUTH ONCE DAILY 90 tablet 0  . hydrochlorothiazide (MICROZIDE) 12.5 MG capsule TAKE ONE CAPSULE BY MOUTH ONCE DAILY IN THE MORNING 90 capsule 1  . metoprolol succinate (TOPROL-XL) 100 MG 24 hr tablet Take 50 mg by mouth daily. Take with or immediately following a meal.    . ramipril (ALTACE) 5 MG capsule TAKE ONE CAPSULE BY MOUTH ONCE DAILY 30 capsule 5  . warfarin (COUMADIN) 5 MG tablet Take 2.5-5 mg by mouth See admin instructions. Take 5mg every day except on Tuesday take 2.5mg per patient    . metoprolol succinate (TOPROL-XL) 100 MG 24 hr tablet TAKE ONE-HALF TABLET BY MOUTH ONCE DAILY (Patient not taking: Reported on 09/23/2014) 45 tablet 0  . warfarin (COUMADIN) 5 MG tablet Take as directed by coumadin clinic  (Patient not taking: Reported on 09/23/2014) 100 tablet 0    Results for orders placed or performed during the hospital encounter of 09/23/14 (from the past 48 hour(s))  CBC with Differential     Status: Abnormal   Collection Time: 09/23/14  7:30 PM  Result Value Ref Range   WBC 15.6 (H) 4.0 - 10.5 K/uL   RBC 5.37 4.22 - 5.81 MIL/uL   Hemoglobin 13.4 13.0 - 17.0 g/dL   HCT 42.4 39.0 - 52.0 %   MCV 79.0 78.0 - 100.0 fL   MCH 25.0 (L) 26.0 - 34.0 pg   MCHC 31.6 30.0 - 36.0 g/dL   RDW 15.9 (H) 11.5 - 15.5 %   Platelets 186 150 - 400 K/uL   Neutrophils Relative % 85 (H) 43 - 77 %   Neutro Abs 13.1 (H) 1.7 - 7.7 K/uL   Lymphocytes Relative 7 (L) 12 - 46 %   Lymphs Abs 1.1 0.7 - 4.0 K/uL   Monocytes Relative 7 3 - 12 %   Monocytes Absolute 1.1 (H) 0.1 - 1.0 K/uL   Eosinophils Relative 1 0 - 5 %   Eosinophils Absolute 0.2 0.0 - 0.7 K/uL   Basophils Relative 0 0 - 1 %   Basophils Absolute 0.1 0.0 - 0.1 K/uL  Basic metabolic panel     Status: Abnormal   Collection Time: 09/23/14  7:30 PM    Result Value Ref Range   Sodium 137 135 - 145 mmol/L   Potassium 4.1 3.5 - 5.1 mmol/L   Chloride 100 96 - 112 mmol/L   CO2 26 19 - 32 mmol/L   Glucose, Bld 159 (H) 70 - 99 mg/dL   BUN 20 6 - 23 mg/dL   Creatinine, Ser 0.79 0.50 - 1.35 mg/dL   Calcium 9.3 8.4 - 10.5 mg/dL   GFR calc non Af Amer 78 (L) >90 mL/min   GFR calc Af Amer >90 >90 mL/min    Comment: (NOTE) The eGFR has been calculated using the CKD EPI equation. This calculation has not been validated in all clinical situations. eGFR's persistently <90 mL/min signify possible Chronic Kidney Disease.    Anion gap 11 5 - 15  Protime-INR     Status: Abnormal   Collection Time: 09/23/14  7:30 PM  Result Value Ref Range   Prothrombin Time 24.6 (H) 11.6 - 15.2 seconds   INR 2.20 (H) 0.00 - 1.49  Urinalysis, Routine w reflex microscopic     Status: Abnormal   Collection Time: 09/23/14  8:04 PM  Result Value Ref Range   Color, Urine YELLOW  YELLOW   APPearance CLEAR CLEAR   Specific Gravity, Urine 1.021 1.005 - 1.030   pH 5.5 5.0 - 8.0   Glucose, UA NEGATIVE NEGATIVE mg/dL   Hgb urine dipstick SMALL (A) NEGATIVE   Bilirubin Urine NEGATIVE NEGATIVE   Ketones, ur NEGATIVE NEGATIVE mg/dL   Protein, ur NEGATIVE NEGATIVE mg/dL   Urobilinogen, UA 0.2 0.0 - 1.0 mg/dL   Nitrite POSITIVE (A) NEGATIVE   Leukocytes, UA NEGATIVE NEGATIVE  Urine microscopic-add on     Status: Abnormal   Collection Time: 09/23/14  8:04 PM  Result Value Ref Range   Squamous Epithelial / LPF RARE RARE   WBC, UA 0-2 <3 WBC/hpf   RBC / HPF 3-6 <3 RBC/hpf   Bacteria, UA MANY (A) RARE   Casts HYALINE CASTS (A) NEGATIVE   Dg Knee 1-2 Views Right  09/23/2014   CLINICAL DATA:  Right hip pain secondary to a fall from bed today.  EXAM: RIGHT KNEE - 1-2 VIEW  COMPARISON:  None.  FINDINGS: There is medial joint space narrowing. Tiny osteophytes on the patella. No joint effusion. No fracture.  IMPRESSION: No acute abnormality of the knee.   Electronically Signed   By: James  Maxwell M.D.   On: 09/23/2014 20:53   Ct Head Wo Contrast  09/23/2014   CLINICAL DATA:  Slipped and fell onto right side while sitting on edge of bed. Concern for head injury. Initial encounter.  EXAM: CT HEAD WITHOUT CONTRAST  TECHNIQUE: Contiguous axial images were obtained from the base of the skull through the vertex without intravenous contrast.  COMPARISON:  CT of the head performed 11/25/2007, and MRI of the brain performed 11/26/2007  FINDINGS: There is no evidence of acute infarction, mass lesion, or intra- or extra-axial hemorrhage on CT.  Prominence of the ventricles and sulci reflects moderate cortical volume loss. Cerebellar atrophy is noted. Mild periventricular and subcortical white matter change likely reflects small vessel ischemic microangiopathy.  The brainstem and fourth ventricle are within normal limits. The basal ganglia are unremarkable in appearance. The cerebral hemispheres  demonstrate grossly normal gray-white differentiation. No mass effect or midline shift is seen.  There is no evidence of fracture; a periapical abscess is noted the root of the right first maxillary molar. The orbits are within normal limits. The paranasal   sinuses and mastoid air cells are well-aerated. No significant soft tissue abnormalities are seen.  IMPRESSION: 1. No acute intracranial pathology seen on CT. 2. Moderate cortical volume loss and scattered small vessel ischemic microangiopathy. 3. Periapical abscess at the root of the right first maxillary molar.   Electronically Signed   By: Jeffery  Chang M.D.   On: 09/23/2014 20:42   Dg Hip Unilat With Pelvis 2-3 Views Right  09/23/2014   CLINICAL DATA:  Fall from bed with right hip pain  EXAM: RIGHT HIP (WITH PELVIS) 2-3 VIEWS  COMPARISON:  None.  FINDINGS: There is a comminuted intratrochanteric right proximal femoral fracture with impaction and angulation at the fracture site. Pelvic ring is intact. No other focal abnormality is seen.  IMPRESSION: Intratrochanteric right femoral fracture.   Electronically Signed   By: Mark  Lukens M.D.   On: 09/23/2014 20:51   Dg Femur, Min 2 Views Right  09/23/2014   CLINICAL DATA:  Hip pain secondary to a fall off of his bed today.  EXAM: RIGHT FEMUR 2 VIEWS  COMPARISON:  None.  FINDINGS: There is a comminuted intertrochanteric fracture of the proximal right femur. The distal femur is intact. There is osteoarthritis of the knee with medial joint space narrowing.  IMPRESSION: Intertrochanteric fracture of the proximal right femur with slight angulation and displacement.   Electronically Signed   By: James  Maxwell M.D.   On: 09/23/2014 20:52    ROS: ROS Right hip pain and inability to ambulate Hard of hearing  Physical Exam: Alert and appropriate 81 y/o male in no acute distress Bilateral upper extremities with full rom no tenderness or deformity Right lower extremity with external rotation and  shortening Painful to right pelvis Left lower extremity with full rom  No tenderness or deformity No other signs of injury or deformity Physical Exam   Assessment/Plan Assessment: right intertroch femur fracture  Plan Medicine team to admit Plan for surgery tomorrow if INR improves Will treat with vitamin k and make NPO after tomorrow Strict non weight bearing Pain control as needed 

## 2014-09-24 NOTE — Progress Notes (Signed)
Orthopedic Tech Progress Note Patient Details:  Aaron Frye October 02, 1924 [id number]  Patient ID: Aaron Frye, male   DOB: May 12, 1925, 81 y.o.   MRN: [id number] Pt unable to use trapeze bar patient helper  Hildred Priest 09/24/2014, 4:30 PM

## 2014-09-24 NOTE — Anesthesia Preprocedure Evaluation (Signed)
Anesthesia Evaluation  Patient identified by MRN, date of birth, ID band Patient awake    Reviewed: Allergy & Precautions, NPO status , Patient's Chart, lab work & pertinent test results  Airway Mallampati: I  TM Distance: >3 FB Neck ROM: Full    Dental  (+) Teeth Intact, Dental Advisory Given   Pulmonary  breath sounds clear to auscultation        Cardiovascular hypertension, Pt. on medications Dysrhythmias: ?hx of A Fib. Rhythm:Regular Rate:Normal     Neuro/Psych    GI/Hepatic   Endo/Other    Renal/GU      Musculoskeletal   Abdominal   Peds  Hematology   Anesthesia Other Findings   Reproductive/Obstetrics                             Anesthesia Physical Anesthesia Plan  ASA: II  Anesthesia Plan: General   Post-op Pain Management:    Induction: Intravenous  Airway Management Planned: LMA  Additional Equipment:   Intra-op Plan:   Post-operative Plan: Extubation in OR  Informed Consent: I have reviewed the patients History and Physical, chart, labs and discussed the procedure including the risks, benefits and alternatives for the proposed anesthesia with the patient or authorized representative who has indicated his/her understanding and acceptance.   Dental advisory given  Plan Discussed with: CRNA, Anesthesiologist and Surgeon  Anesthesia Plan Comments:         Anesthesia Quick Evaluation

## 2014-09-24 NOTE — Brief Op Note (Signed)
09/23/2014 - 09/24/2014  2:21 PM  PATIENT:  Aaron Frye  81 y.o. male  PRE-OPERATIVE DIAGNOSIS:  Right intertrochantric femur fracture  POST-OPERATIVE DIAGNOSIS:  Right intertrochantric femur fracture  PROCEDURE:  Procedure(s): INTRAMEDULLARY (IM) NAIL INTERTROCHANTRIC (Right)  SURGEON:  Surgeon(s) and Role:    * Rod Can, MD - Primary  PHYSICIAN ASSISTANT:   ASSISTANTS: Ky Barban, RNFA  ANESTHESIA:   general  EBL:  Total I/O In: 2023 [I.V.:1300; IV Piggyback:250] Out: 425 [Urine:225; Blood:200]  BLOOD ADMINISTERED:none  DRAINS: none   LOCAL MEDICATIONS USED:  NONE  SPECIMEN:  No Specimen  DISPOSITION OF SPECIMEN:  N/A  COUNTS:  YES  TOURNIQUET:  * No tourniquets in log *  DICTATION: .Other Dictation: Dictation Number [id number]  PLAN OF CARE: Admit to inpatient   PATIENT DISPOSITION:  PACU - hemodynamically stable.   Delay start of Pharmacological VTE agent (>24hrs) due to surgical blood loss or risk of bleeding: not applicable

## 2014-09-24 NOTE — Progress Notes (Addendum)
[facility] PROGRESS NOTE  DEITRICK FERRERI GBT:[id number] DOB: [birth year] DOA: 09/23/2014  PCP: Stephens Shire, MD  Brief HPI: 81 year old Caucasian male with a past medical history of atrial fibrillation on Coumadin, TIA, stroke, hypertension, history of prostate cancer, history of systolic congestive heart failure with EF of about 35-40%, presented after a mechanical fall resulting in fracture of his right hip. He was admitted for further management.  Past medical history:  Past Medical History  Diagnosis Date  . TIA (transient ischemic attack)   . HLD (hyperlipidemia)   . HTN (hypertension)   . CVA (cerebral vascular accident)   . Atrial fibrillation   . Prostate cancer     Consultants: Orthopedics  Procedures: None yet  Antibiotics: Ciprofloxacin 3/25  Subjective: Patient complains of pain in his right hip area, especially with movement. None otherwise. Denies any chest pain or shortness of breath. He tells me that he is fairly active at home. He mows his yard on a regular basis. 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 With these physical exertion he does not develop any shortness of breath or chest pain.  Objective: Vital Signs  Filed Vitals:   09/23/14 2245 09/23/14 2300 09/23/14 2320 09/24/14 0500  BP: 125/56 120/71 142/61 123/54  Pulse: 101 101 91 74  Temp:   98.4 F (36.9 C) 98.4 F (36.9 C)  TempSrc:      Resp: 23 18 18 18   Height:      Weight:    75.342 kg (166 lb 1.6 oz)  SpO2: 96% 96% 95% 93%   No intake or output data in the 24 hours ending 09/24/14 0756 Filed Weights   09/23/14 1923 09/24/14 0500  Weight: 74.39 kg (164 lb) 75.342 kg (166 lb 1.6 oz)    General appearance: alert, cooperative, appears stated age and no distress Head: Normocephalic, without obvious abnormality, atraumatic Resp: clear to auscultation bilaterally Cardio: S1, S2 is irregularly irregular. No S3, S4. No rubs, murmurs, or bruit. No pedal edema. GI: soft, non-tender;  bowel sounds normal; no masses,  no organomegaly Extremities: extremities normal, atraumatic, no cyanosis or edema Pulses: 2+ and symmetric Neurologic: Alert and oriented 3. No focal neurological deficits noted.  Lab Results:  Basic Metabolic Panel:  Recent Labs Lab 09/23/14 1930 09/24/14 0507  NA 137 137  K 4.1 3.9  CL 100 105  CO2 26 26  GLUCOSE 159* 169*  BUN 20 18  CREATININE 0.79 0.78  CALCIUM 9.3 8.7   CBC:  Recent Labs Lab 09/23/14 1930 09/24/14 0507  WBC 15.6* 14.5*  NEUTROABS 13.1*  --   HGB 13.4 11.8*  HCT 42.4 36.9*  MCV 79.0 79.0  PLT 186 184   CBG:  Recent Labs Lab 09/24/14 0635  GLUCAP 149*    Recent Results (from the past 240 hour(s))  Surgical pcr screen     Status: None   Collection Time: 09/24/14  2:00 AM  Result Value Ref Range Status   MRSA, PCR NEGATIVE NEGATIVE Final   Staphylococcus aureus NEGATIVE NEGATIVE Final    Comment:        The Xpert SA Assay (FDA approved for NASAL specimens in patients over 41 years of age), is one component of a comprehensive surveillance program.  Test performance has been validated by [facility] for patients greater than or equal to 52 year old. It is not intended to diagnose infection nor to guide or monitor treatment.       Studies/Results: Dg Knee 1-2 Views  Right  09/23/2014   CLINICAL DATA:  Right hip pain secondary to a fall from bed today.  EXAM: RIGHT KNEE - 1-2 VIEW  COMPARISON:  None.  FINDINGS: There is medial joint space narrowing. Tiny osteophytes on the patella. No joint effusion. No fracture.  IMPRESSION: No acute abnormality of the knee.   Electronically Signed   By: Lorriane Shire M.D.   On: 09/23/2014 20:53   Ct Head Wo Contrast  09/23/2014   CLINICAL DATA:  Slipped and fell onto right side while sitting on edge of bed. Concern for head injury. Initial encounter.  EXAM: CT HEAD WITHOUT CONTRAST  TECHNIQUE: Contiguous axial images were obtained from the base of the skull  through the vertex without intravenous contrast.  COMPARISON:  CT of the head performed 11/25/2007, and MRI of the brain performed 11/26/2007  FINDINGS: There is no evidence of acute infarction, mass lesion, or intra- or extra-axial hemorrhage on CT.  Prominence of the ventricles and sulci reflects moderate cortical volume loss. Cerebellar atrophy is noted. Mild periventricular and subcortical white matter change likely reflects small vessel ischemic microangiopathy.  The brainstem and fourth ventricle are within normal limits. The basal ganglia are unremarkable in appearance. The cerebral hemispheres demonstrate grossly normal gray-white differentiation. No mass effect or midline shift is seen.  There is no evidence of fracture; a periapical abscess is noted the root of the right first maxillary molar. The orbits are within normal limits. The paranasal sinuses and mastoid air cells are well-aerated. No significant soft tissue abnormalities are seen.  IMPRESSION: 1. No acute intracranial pathology seen on CT. 2. Moderate cortical volume loss and scattered small vessel ischemic microangiopathy. 3. Periapical abscess at the root of the right first maxillary molar.   Electronically Signed   By: Garald Balding M.D.   On: 09/23/2014 20:42   Dg Hip Unilat With Pelvis 2-3 Views Right  09/23/2014   CLINICAL DATA:  Fall from bed with right hip pain  EXAM: RIGHT HIP (WITH PELVIS) 2-3 VIEWS  COMPARISON:  None.  FINDINGS: There is a comminuted intratrochanteric right proximal femoral fracture with impaction and angulation at the fracture site. Pelvic ring is intact. No other focal abnormality is seen.  IMPRESSION: Intratrochanteric right femoral fracture.   Electronically Signed   By: Inez Catalina M.D.   On: 09/23/2014 20:51   Dg Femur, Min 2 Views Right  09/23/2014   CLINICAL DATA:  Hip pain secondary to a fall off of his bed today.  EXAM: RIGHT FEMUR 2 VIEWS  COMPARISON:  None.  FINDINGS: There is a comminuted  intertrochanteric fracture of the proximal right femur. The distal femur is intact. There is osteoarthritis of the knee with medial joint space narrowing.  IMPRESSION: Intertrochanteric fracture of the proximal right femur with slight angulation and displacement.   Electronically Signed   By: Lorriane Shire M.D.   On: 09/23/2014 20:52    Medications:  Scheduled: . aspirin  81 mg Oral Daily  . atorvastatin  40 mg Oral Daily  . chlorhexidine  60 mL Topical Once  . ciprofloxacin  400 mg Intravenous Q12H  . clindamycin (CLEOCIN) IV  900 mg Intravenous On Call to OR  . metoprolol succinate  50 mg Oral Q breakfast  . ramipril  5 mg Oral Daily  . sodium chloride  3 mL Intravenous Q12H   Continuous:  UXN:ATFTDDUKGURKY **OR** acetaminophen, alum & mag hydroxide-simeth, morphine injection, ondansetron **OR** ondansetron (ZOFRAN) IV, oxyCODONE-acetaminophen  Assessment/Plan:  Principal Problem:  Intertrochanteric fracture of right hip Active Problems:   Essential hypertension   Atrial fibrillation   Congestive heart failure   Long term current use of anticoagulant therapy   History of stroke   HLD (hyperlipidemia)   Leukocytosis     Right hip fracture Plan is for surgical intervention. EKG reveals atrial fibrillation without any acute ischemia. Chest x-ray still pending. Has a good functional capacity. He is undergoing intermediate risk procedure. He does have significant cardiac risk factors. Based on the [facility] perioperative cardiac risk index his risk of cardiac complications in the perioperative time period is about 0.94%. At this time patient may proceed to surgery without further testing except for CXR. His volume status will need to be monitored closely in the perioperative time period. Pain control. Further management per orthopedics. Hold his IV heparin prior to surgery.  Chronic Systolic congestive heart failure 2-D echo on 11/27/2007 showed EF 35-40%. It is compensated on  admission. Continue home medications. Monitor volume status closely.  Essential Hypertension Continue hydrochlorothiazide, metoprolol, ramipril. Blood pressure is reasonably well controlled.  Chronic Atrial Fibrillation Rate is well controlled. Patient is on beta blocker at home. He is also on warfarin, which has been held. He was given a dose of vitamin K last night. His INR is down to 1.8. He was also started on heparin intravenously for bridging. CHA2DS2-VASc Score is 6. Resume warfarin postoperatively when cleared by orthopedics. Due to high risk as outlined by the chads 2 Vascular score intravenous heparin as a bridge may be beneficial.  History of stroke Stable. Continue Lipitor and aspirin  Hyperlipidemia Continue Lipitor.   Abnormal UA and Leukocytosis Initiate ciprofloxacin.   DVT Prophylaxis: Was on intravenous heparin. Warfarin to be resumed tonight.    Code Status: Full code  Family Communication: Discussed with the patient  Disposition Plan: Plan for surgical intervention later today.    LOS: 1 day   [facility] Pager [phone] 09/24/2014, 7:56 AM  If 7PM-7AM, please contact night-coverage at [email or web address], password [facility]

## 2014-09-24 NOTE — Progress Notes (Signed)
Spoke with Merla Riches, PA about heparin drip. Per PA, will hold heparin drip until decision is made concerning surgery.

## 2014-09-24 NOTE — OR Nursing (Signed)
Due to traumatic insertion of foley, Dr. Lyla Glassing did not want to D/C foley in the OR.

## 2014-09-24 NOTE — Progress Notes (Signed)
PT Cancellation Note  Patient Details Name: DAM ASHRAF MRN: [id number] DOB: 1925/05/17   Cancelled Treatment: Reason Eval/Treat Not Completed: Patient at procedure or test/unavailable (Pt location: [facility] OR). PT will see when pt returns to Rm 5N if still appropriate.  Thank you for this referral.  Joslyn Hy PT, DPT [phone] #[id number] 09/24/2014, 12:34 AM PT Cancellation Note

## 2014-09-24 NOTE — Progress Notes (Signed)
ANTICOAGULATION CONSULT NOTE - Initial Consult  Pharmacy Consult for coumadin Indication: atrial fibrillation  Allergies  Allergen Reactions  . Penicillins     REACTION: unspecified    Patient Measurements: Height: 5\' 7"  (170.2 cm) Weight: 166 lb 1.6 oz (75.342 kg) IBW/kg (Calculated) : 66.1 Heparin Dosing Weight:   Vital Signs: Temp: 97 F (36.1 C) (03/25 1433) BP: 127/57 mmHg (03/25 1500) Pulse Rate: 144 (03/25 1445)  Labs:  Recent Labs  09/23/14 1930 09/24/14 0049 09/24/14 0507  HGB 13.4  --  11.8*  HCT 42.4  --  36.9*  PLT 186  --  184  APTT  --  33  --   LABPROT 24.6*  --  21.9*  INR 2.20*  --  1.89*  CREATININE 0.79  --  0.78    Estimated Creatinine Clearance: 60.8 mL/min (by C-G formula based on Cr of 0.78).   Medical History: Past Medical History  Diagnosis Date  . TIA (transient ischemic attack)   . HLD (hyperlipidemia)   . HTN (hypertension)   . CVA (cerebral vascular accident)   . Atrial fibrillation   . Prostate cancer     Medications:  Scheduled:  . [MAR Hold] aspirin  81 mg Oral Daily  . [MAR Hold] atorvastatin  40 mg Oral Daily  . chlorhexidine  60 mL Topical Once  . [MAR Hold] ciprofloxacin  400 mg Intravenous Q12H  . [MAR Hold] metoprolol succinate  50 mg Oral Q breakfast  . [MAR Hold] ramipril  5 mg Oral Daily  . [MAR Hold] sodium chloride  3 mL Intravenous Q12H  . warfarin  5 mg Oral ONCE-1800  . Warfarin - Pharmacist Dosing Inpatient   Does not apply q1800   Infusions:  . lactated ringers 10 mL/hr at 09/24/14 1112    Assessment: 81 yo male with afib s/p ortho surgery will be put back on coumadin.  Patient was on coumadin 5 mg po daily except 2.5 mg on Tuesdays prior to [place] (last dose was on 03/23) and INR was therapeutic.  Patient was reversed with vitamin K 10 mg po on 03/24.  INR today is 1.89.  Team wants to start lovenox tomorrow am.  Patient is on cipro.   Goal of Therapy:  INR 2-3 Monitor platelets by  anticoagulation protocol: Yes   Plan:  - coumadin 5 mg po x1 (didn't lower dose even though on cipro since got vitamin K yesterday to balance out) - INR in am - consider switching cipro to a cephalosporin if possible - once lovenox is started, d/c when INR >/= 2.  - if INR cont to be low, please consider bridging with heparin  Brittish Bolinger, Tsz-Yin 09/24/2014,3:12 PM

## 2014-09-24 NOTE — Interval H&P Note (Signed)
History and Physical Interval Note:  09/24/2014 11:32 AM  Aaron Frye  has presented today for surgery, with the diagnosis of Right intertrochantric femur fracture  The various methods of treatment have been discussed with the patient and family. After consideration of risks, benefits and other options for treatment, the patient has consented to  Procedure(s): INTRAMEDULLARY (IM) NAIL INTERTROCHANTRIC (Right) as a surgical intervention .  The patient's history has been reviewed, patient examined, no change in status, stable for surgery.  I have reviewed the patient's chart and labs.  Questions were answered to the patient's satisfaction.     Tremane Spurgeon, Horald Pollen

## 2014-09-25 DIAGNOSIS — B9620 Unspecified Escherichia coli [E. coli] as the cause of diseases classified elsewhere: Secondary | ICD-10-CM

## 2014-09-25 DIAGNOSIS — N390 Urinary tract infection, site not specified: Secondary | ICD-10-CM

## 2014-09-25 LAB — BASIC METABOLIC PANEL
Anion gap: 6 (ref 5–15)
BUN: 19 mg/dL (ref 6–23)
CALCIUM: 8.3 mg/dL — AB (ref 8.4–10.5)
CO2: 28 mmol/L (ref 19–32)
CREATININE: 0.89 mg/dL (ref 0.50–1.35)
Chloride: 102 mmol/L (ref 96–112)
GFR calc non Af Amer: 75 mL/min — ABNORMAL LOW (ref >90)
GFR, EST AFRICAN AMERICAN: 87 mL/min — AB (ref >90)
GLUCOSE: 175 mg/dL — AB (ref 70–99)
Potassium: 4.1 mmol/L (ref 3.5–5.1)
Sodium: 136 mmol/L (ref 135–145)

## 2014-09-25 LAB — URINE CULTURE: Colony Count: >=100000

## 2014-09-25 LAB — CBC
HCT: 32.6 % — ABNORMAL LOW (ref 39.0–52.0)
Hemoglobin: 10.3 g/dL — ABNORMAL LOW (ref 13.0–17.0)
MCH: 25.2 pg — ABNORMAL LOW (ref 26.0–34.0)
MCHC: 31.6 g/dL (ref 30.0–36.0)
MCV: 79.9 fL (ref 78.0–100.0)
PLATELETS: 156 10*3/uL (ref 150–400)
RBC: 4.08 MIL/uL — ABNORMAL LOW (ref 4.22–5.81)
RDW: 16.2 % — AB (ref 11.5–15.5)
WBC: 15.8 10*3/uL — AB (ref 4.0–10.5)

## 2014-09-25 LAB — HEPARIN LEVEL (UNFRACTIONATED): HEPARIN UNFRACTIONATED: 0.73 [IU]/mL — AB (ref 0.30–0.70)

## 2014-09-25 LAB — PROTIME-INR
INR: 1.51 — AB (ref 0.00–1.49)
PROTHROMBIN TIME: 18.4 s — AB (ref 11.6–15.2)

## 2014-09-25 LAB — GLUCOSE, CAPILLARY: GLUCOSE-CAPILLARY: 169 mg/dL — AB (ref 70–99)

## 2014-09-25 MED ORDER — SACCHAROMYCES BOULARDII 250 MG PO CAPS
250.0000 mg | ORAL_CAPSULE | Freq: Two times a day (BID) | ORAL | Status: DC
  Administered 2014-09-25 – 2014-09-27 (×5): 250 mg via ORAL
  Filled 2014-09-25 (×7): qty 1

## 2014-09-25 MED ORDER — CEFPODOXIME PROXETIL 200 MG PO TABS
200.0000 mg | ORAL_TABLET | Freq: Two times a day (BID) | ORAL | Status: DC
  Administered 2014-09-25 – 2014-09-27 (×5): 200 mg via ORAL
  Filled 2014-09-25 (×7): qty 1

## 2014-09-25 MED ORDER — HEPARIN (PORCINE) IN NACL 100-0.45 UNIT/ML-% IJ SOLN
1300.0000 [IU]/h | INTRAMUSCULAR | Status: DC
  Administered 2014-09-25: 1200 [IU]/h via INTRAVENOUS
  Administered 2014-09-26: 1100 [IU]/h via INTRAVENOUS
  Administered 2014-09-27: 1300 [IU]/h via INTRAVENOUS
  Filled 2014-09-25 (×6): qty 250

## 2014-09-25 MED ORDER — WARFARIN SODIUM 5 MG PO TABS
5.0000 mg | ORAL_TABLET | Freq: Once | ORAL | Status: AC
  Administered 2014-09-25: 5 mg via ORAL
  Filled 2014-09-25: qty 1

## 2014-09-25 NOTE — Evaluation (Signed)
Physical Therapy Evaluation Patient Details Name: Aaron Frye MRN: [id number] DOB: 1925/05/12 Today's Date: 09/25/2014   History of Present Illness  Pt is a 81 y.o. very active male who fell at home and fx femur. s/p Rt IM nail.   Clinical Impression  Patient is s/p above surgery du to fall resulting in functional limitations due to the deficits listed below (see PT Problem List). Patient will benefit from skilled PT to increase their independence and safety with mobility to allow discharge to the venue listed below.  Daughter present for session; discussed goals of therapy for safe D/C home.      Follow Up Recommendations Home health PT;Supervision/Assistance - 24 hour    Equipment Recommendations  None recommended by PT    Recommendations for Other Services OT consult     Precautions / Restrictions Precautions Precautions: Fall Restrictions Weight Bearing Restrictions: Yes RLE Weight Bearing: Weight bearing as tolerated      Mobility  Bed Mobility Overal bed mobility: Needs Assistance Bed Mobility: Supine to Sit     Supine to sit: Mod assist;HOB elevated     General bed mobility comments: pt relying on handrails for bed mobility and max cueing for sequencing; mod (A) to bring Rt HIp off EOB with use of draw pad and elevate trunk; pt with heavy lean to Lt  Transfers Overall transfer level: Needs assistance Equipment used: Rolling walker (2 wheeled) Transfers: Sit to/from [facility] Sit to Stand: Mod assist;From elevated surface Stand pivot transfers: Min assist;From elevated surface       General transfer comment: cues for technique and safety with RW; (A) to balance and manage RW with pivotal steps to chair  Ambulation/Gait             General Gait Details: pivotal steps only this session  Stairs            Wheelchair Mobility    Modified Rankin (Stroke Patients Only)       Balance Overall balance assessment: Needs  assistance;History of Falls Sitting-balance support: No upper extremity supported;Bilateral upper extremity supported Sitting balance-Leahy Scale: Poor Sitting balance - Comments: pt with multiple bouts of balance loss and difficulty maintaining positioning on EOB without UE support; heavy lean to Lt Postural control: Left lateral lean Standing balance support: During functional activity;Bilateral upper extremity supported Standing balance-Leahy Scale: Poor Standing balance comment: RW to balance                             Pertinent Vitals/Pain Pain Assessment: No/denies pain    Home Living Family/patient expects to be discharged to:: Private residence Living Arrangements: Spouse/significant other;Children Available Help at Discharge: Family;Available 24 hours/day Type of Home: House Home Access: Ramped entrance     Home Layout: One level Home Equipment: Walker - 2 wheels;Shower seat      Prior Function Level of Independence: Independent               Hand Dominance        Extremity/Trunk Assessment   Upper Extremity Assessment: Defer to OT evaluation           Lower Extremity Assessment: RLE deficits/detail RLE Deficits / Details: Rt quad 2+/5    Cervical / Trunk Assessment: Kyphotic  Communication   Communication: HOH  Cognition Arousal/Alertness: Suspect due to medications;Lethargic Behavior During Therapy: Flat affect Overall Cognitive Status: Impaired/Different from baseline Area of Impairment: Attention;Following commands;Problem solving   Current  Attention Level: Sustained   Following Commands: Follows one step commands with increased time     Problem Solving: Slow processing;Difficulty sequencing;Requires verbal cues;Requires tactile cues General Comments: decreased processing and answering questions; could be attributed to difficulty hearing vs medication     General Comments General comments (skin integrity, edema, etc.):  daughter present for session; discussed goals of therapy to D/C home vs SNF    Exercises General Exercises - Lower Extremity Ankle Circles/Pumps: AROM;Both;10 reps Long Arc Quad: Right;10 reps;Seated;AAROM      Assessment/Plan    PT Assessment Patient needs continued PT services  PT Diagnosis Difficulty walking;Acute pain;Generalized weakness   PT Problem List Decreased strength;Decreased range of motion;Decreased activity tolerance;Decreased balance;Decreased mobility;Decreased knowledge of use of DME;Decreased safety awareness;Decreased knowledge of precautions;Pain  PT Treatment Interventions DME instruction;Gait training;Functional mobility training;Therapeutic activities;Balance training;Therapeutic exercise;Neuromuscular re-education;Patient/family education   PT Goals (Current goals can be found in the Care Plan section) Acute Rehab PT Goals Patient Stated Goal: to go home PT Goal Formulation: With patient/family Time For Goal Achievement: 10/02/14 Potential to Achieve Goals: Good    Frequency Min 5X/week   Barriers to discharge        Co-evaluation               End of Session Equipment Utilized During Treatment: Gait belt Activity Tolerance: Patient tolerated treatment well Patient left: in chair;with call bell/phone within reach;with family/visitor present Nurse Communication: Mobility status;Precautions;Weight bearing status         Time: 1962-2297 PT Time Calculation (min) (ACUTE ONLY): 18 min   Charges:   PT Evaluation $Initial PT Evaluation Tier I: 1 Procedure     PT G CodesGustavus Bryant, [place]  [phone] 09/25/2014, 11:15 AM

## 2014-09-25 NOTE — Anesthesia Postprocedure Evaluation (Signed)
  Anesthesia Post-op Note  Patient: Aaron Frye  Procedure(s) Performed: Procedure(s) (LRB): INTRAMEDULLARY (IM) NAIL INTERTROCHANTRIC (Right)  Patient Location: PACU  Anesthesia Type: General  Level of Consciousness: awake and alert   Airway and Oxygen Therapy: Patient Spontanous Breathing  Post-op Pain: mild  Post-op Assessment: Post-op Vital signs reviewed, Patient's Cardiovascular Status Stable, Respiratory Function Stable, Patent Airway and No signs of Nausea or vomiting  Last Vitals:  Filed Vitals:   09/25/14 0450  BP:   Pulse: 91  Temp:   Resp:     Post-op Vital Signs: stable   Complications: No apparent anesthesia complications

## 2014-09-25 NOTE — Op Note (Signed)
Aaron Frye, MAFFETT NO.:  [id number]  MEDICAL RECORD NO.:  [id number]  LOCATION:  5N08C                        FACILITY:  [facility]  PHYSICIAN:  Rod Can, MD     DATE OF BIRTH:  [birth year]  DATE OF PROCEDURE:  09/24/2014 DATE OF DISCHARGE:                              OPERATIVE REPORT   SURGEON:  Rod Can, MD  ASSISTANT:  Ky Barban, RNFA  PROCEDURE:  Cephalomedullary nail placement, right pertrochanteric femur fracture.  IMPLANTS: 1. Biomet Affixus hip fracture nail 125 degrees 11 mm x 400 mm. 2. A 10.5 x 100 lag screw. 3. A 5.0 x 40 mm distal interlocking screw x1.  ANTIBIOTICS:  600 mg of clindamycin.  COMPLICATIONS:  None.  DISPOSITION:  Stable to PACU.  TUBES AND DRAINS:  None.  SPECIMENS:  None.  INDICATIONS:  The patient is an 81 year old male that tripped and fell while putting on his pants yesterday. He was unable to weight bear.  He came to the emergency department and x-rays revealed an intertrochanteric femur fracture.  He was admitted to the Hospitalist Service.  He takes Coumadin for atrial fibrillation.  His INR on admission was 2.2.  He was given vitamin K orally.  Overnight, his INR came down.  He was indicated for cephalomedullary nail placement. Risks, benefits, and alternatives were explained and he elected to proceed.  DESCRIPTION OF PROCEDURE IN DETAIL:  The patient was correctly identified in the preop holding area using 2 patient identifiers. Surgical site was marked by myself.  The patient was taken to the operating room and  general anesthesia was induced.  Foley catheter was placed.  IV antibiotics were given within 60 minutes of beginning of the procedure.  He was then positioned on the fracture table and the left leg was placed in a leg holder.  The fracture was reduced with traction, internal rotation, and adduction of the lower extremity.  The right hip was prepped and draped in a normal sterile  surgical fashion.  Time-out was called verifying side and site of surgery.  I began by making a 4 cm incision proximal to the tip of the greater trochanter.  I inserted a guide pin for standard trochanteric entry.  I used an entry reamer followed by a guidewire down to the physeal scar and measured the length of the nail.  I reamed up to a 13 mm and then placed the appropriate length 11 mm nail.  I removed the guidewire and then through a separate stab incision I inserted a cannula down to the bone.  I placed the guidewire for the lag screw.  This was then measured and reamed.  I then placed the 100 mm lag screw.  Tip apex distance was appropriate. Fracture reduction and hardware position were confirmed on orthogonal fluoroscopy views.  The traction was let off.  I compressed the fracture through the jig of the nail.  The set screw was tightened.  I then removed the jig and cannulas proximally.  I obtained final images.  I then came down to the knee and using a perfect circle technique, I placed 1 distal interlocking screw.  The wounds were then copiously irrigated with saline.  Fluoro images were obtained.  The wounds were closed in layers using #1 Vicryl for the fascia, 2-0 Monocryl for the deep dermal layer, and 3-0 running Monocryl subcuticular stitch. Dermabond was applied to the skin.  Once the glue was dried, sterile dressings were applied.  The patient was transferred to the bed and extubated.  He was taken to the PACU in stable condition.  Sponge, needle, and instrument counts were correct at the end of the case x2. There were no known complications.  I discussed the operative events and findings with the patient's family. He may weight bear as tolerated with a walker.  We will restart his Coumadin tonight.  We will place him on a Lovenox bridge beginning tomorrow morning.  He will work with Physical Therapy.  I need to see him in 2 weeks for wound check.  He will be on a walker  for about 10-12 weeks at the minimum.  All questions were solicited and answered to their satisfaction.          ______________________________ Rod Can, MD     BS/MEDQ  D:  09/24/2014  T:  09/25/2014  Job:  [id number]

## 2014-09-25 NOTE — Progress Notes (Signed)
ANTICOAGULATION CONSULT NOTE - Initial Consult  Pharmacy Consult for coumadin Indication: atrial fibrillation  Allergies  Allergen Reactions  . Penicillins     REACTION: unspecified    Patient Measurements: Height: 5\' 7"  (170.2 cm) Weight: 185 lb 1.6 oz (83.961 kg) IBW/kg (Calculated) : 66.1 Heparin Dosing Weight:   Vital Signs: Temp: 98.3 F (36.8 C) (03/26 0317) Temp Source: Oral (03/26 0317) BP: 115/50 mmHg (03/26 0317) Pulse Rate: 91 (03/26 0450)  Labs:  Recent Labs  09/23/14 1930 09/24/14 0049 09/24/14 0507 09/25/14 0542  HGB 13.4  --  11.8* 10.3*  HCT 42.4  --  36.9* 32.6*  PLT 186  --  184 156  APTT  --  33  --   --   LABPROT 24.6*  --  21.9* 18.4*  INR 2.20*  --  1.89* 1.51*  CREATININE 0.79  --  0.78 0.89    Estimated Creatinine Clearance: 60.6 mL/min (by C-G formula based on Cr of 0.89).   Medical History: Past Medical History  Diagnosis Date  . TIA (transient ischemic attack)   . HLD (hyperlipidemia)   . HTN (hypertension)   . CVA (cerebral vascular accident)   . Atrial fibrillation   . Prostate cancer     Medications:  Scheduled:  . aspirin  81 mg Oral Daily  . atorvastatin  40 mg Oral Daily  . cefpodoxime  200 mg Oral Q12H  . docusate sodium  100 mg Oral BID  . enoxaparin (LOVENOX) injection  40 mg Subcutaneous Q24H  . metoprolol succinate  50 mg Oral Q breakfast  . ramipril  5 mg Oral Daily  . saccharomyces boulardii  250 mg Oral BID  . senna  2 tablet Oral QHS  . sodium chloride  3 mL Intravenous Q12H  . Warfarin - Pharmacist Dosing Inpatient   Does not apply q1800   Infusions:     Assessment: 81 yo male with afib on warfarin PTA admitted 09/23/2014 following fall with R hip fracture. Pharmacy was consulted to dose warfarin for atrial fibrillation, and heparin bridge post op.   Coag/Heme: afib hx of prostate CA, reversed with vitamin K 10 mg po on 03/24 for ortho surgery 3/25 INR < goal, CBC wnl  received one dose of lovenox  this am  Patient is on cipro. To begin heparin for bridge.  ID: WBC trend up 15.8; on cipro for r/o UTI; afebrile  03/24 ucx >> ecoli 100k, sensitivities pending  Renal: (CrCl ~60); other lytes ok, UOP 0.3 ml/kg/min  Goal of Therapy:  INR 2-3 Monitor platelets by anticoagulation protocol: Yes   Plan:  Warfarin 5 mg po today Daily INR, CBC, Heparin level DC lovenox, at noon begin heparin IV 1200 units/hr Check 6h heparin level   Thank you for allowing pharmacy to be a part of this patients care team.  Rowe Williom Pharm.D., BCPS, AQ-Cardiology Clinical Pharmacist 09/25/2014 9:47 AM Pager: [phone] Phone: [phone]

## 2014-09-25 NOTE — Progress Notes (Signed)
Per order, foley catheter was removed. Bright red blood coming from the penis d/t trauma of insertion of the foley catheter in OR. Condom cath placed d/t patient's history of prostrate cancer resulting in leakage. Will continue to monitor the bleeding from the penis and notify MD if there is worsening of condition.

## 2014-09-25 NOTE — Progress Notes (Signed)
[facility] PROGRESS NOTE  CARREL LEATHER LXB:[id number] DOB: June 01, 1925 DOA: 09/23/2014  PCP: Stephens Shire, MD  Brief HPI: 81 year old Caucasian male with a past medical history of atrial fibrillation on Coumadin, TIA, stroke, hypertension, history of prostate cancer, history of systolic congestive heart failure with EF of about 35-40%, presented after a mechanical fall resulting in fracture of his right hip. He was admitted for further management.  Past medical history:  Past Medical History  Diagnosis Date  . TIA (transient ischemic attack)   . HLD (hyperlipidemia)   . HTN (hypertension)   . CVA (cerebral vascular accident)   . Atrial fibrillation   . Prostate cancer     Consultants: Orthopedics  Procedures:  Open reduction internal fixation of the right hip fracture  Antibiotics: Ciprofloxacin 3/25--3/26 Vantin 3/26  Subjective: Patient feels well. Denies significant pain in the right hip area. No chest pain or shortness of breath.   Objective: Vital Signs  Filed Vitals:   09/24/14 2337 09/25/14 0317 09/25/14 0353 09/25/14 0450  BP:  115/50    Pulse:  86  91  Temp:  98.3 F (36.8 C)    TempSrc:  Oral    Resp: 14  12   Height:      Weight:    83.961 kg (185 lb 1.6 oz)  SpO2: 96% 95% 96% 96%    Intake/Output Summary (Last 24 hours) at 09/25/14 0928 Last data filed at 09/25/14 4163  Gross per 24 hour  Intake   1772 ml  Output    825 ml  Net    947 ml   Filed Weights   09/23/14 1923 09/24/14 0500 09/25/14 0450  Weight: 74.39 kg (164 lb) 75.342 kg (166 lb 1.6 oz) 83.961 kg (185 lb 1.6 oz)    General appearance: alert, cooperative, appears stated age and no distress Resp: clear to auscultation bilaterally Cardio: S1, S2 is irregularly irregular. No S3, S4. No rubs, murmurs, or bruit. No pedal edema. GI: soft, non-tender; bowel sounds normal; no masses,  no organomegaly Extremities: Right hip area was examined. No bruising. No swelling. Not  fully mobilize.  Pulses: 2+ and symmetric Neurologic: Alert and oriented 3. No focal neurological deficits noted.  Lab Results:  Basic Metabolic Panel:  Recent Labs Lab 09/23/14 1930 09/24/14 0507 09/25/14 0542  NA 137 137 136  K 4.1 3.9 4.1  CL 100 105 102  CO2 26 26 28   GLUCOSE 159* 169* 175*  BUN 20 18 19   CREATININE 0.79 0.78 0.89  CALCIUM 9.3 8.7 8.3*   CBC:  Recent Labs Lab 09/23/14 1930 09/24/14 0507 09/25/14 0542  WBC 15.6* 14.5* 15.8*  NEUTROABS 13.1*  --   --   HGB 13.4 11.8* 10.3*  HCT 42.4 36.9* 32.6*  MCV 79.0 79.0 79.9  PLT 186 184 156   CBG:  Recent Labs Lab 09/24/14 0635 09/25/14 0636  GLUCAP 149* 169*    Recent Results (from the past 240 hour(s))  Urine culture     Status: None (Preliminary result)   Collection Time: 09/23/14  8:22 PM  Result Value Ref Range Status   Specimen Description URINE, RANDOM  Final   Special Requests NONE  Final   Colony Count   Final    >=100,000 COLONIES/ML Performed at [facility]    Culture   Final    ESCHERICHIA COLI Performed at [facility]    Report Status PENDING  Incomplete  Surgical pcr screen     Status:  None   Collection Time: 09/24/14  2:00 AM  Result Value Ref Range Status   MRSA, PCR NEGATIVE NEGATIVE Final   Staphylococcus aureus NEGATIVE NEGATIVE Final    Comment:        The Xpert SA Assay (FDA approved for NASAL specimens in patients over 58 years of age), is one component of a comprehensive surveillance program.  Test performance has been validated by [facility] for patients greater than or equal to 71 year old. It is not intended to diagnose infection nor to guide or monitor treatment.       Studies/Results: Dg Knee 1-2 Views Right  09/23/2014   CLINICAL DATA:  Right hip pain secondary to a fall from bed today.  EXAM: RIGHT KNEE - 1-2 VIEW  COMPARISON:  None.  FINDINGS: There is medial joint space narrowing. Tiny osteophytes on the patella. No joint  effusion. No fracture.  IMPRESSION: No acute abnormality of the knee.   Electronically Signed   By: Lorriane Shire M.D.   On: 09/23/2014 20:53   Ct Head Wo Contrast  09/23/2014   CLINICAL DATA:  Slipped and fell onto right side while sitting on edge of bed. Concern for head injury. Initial encounter.  EXAM: CT HEAD WITHOUT CONTRAST  TECHNIQUE: Contiguous axial images were obtained from the base of the skull through the vertex without intravenous contrast.  COMPARISON:  CT of the head performed 11/25/2007, and MRI of the brain performed 11/26/2007  FINDINGS: There is no evidence of acute infarction, mass lesion, or intra- or extra-axial hemorrhage on CT.  Prominence of the ventricles and sulci reflects moderate cortical volume loss. Cerebellar atrophy is noted. Mild periventricular and subcortical white matter change likely reflects small vessel ischemic microangiopathy.  The brainstem and fourth ventricle are within normal limits. The basal ganglia are unremarkable in appearance. The cerebral hemispheres demonstrate grossly normal gray-white differentiation. No mass effect or midline shift is seen.  There is no evidence of fracture; a periapical abscess is noted the root of the right first maxillary molar. The orbits are within normal limits. The paranasal sinuses and mastoid air cells are well-aerated. No significant soft tissue abnormalities are seen.  IMPRESSION: 1. No acute intracranial pathology seen on CT. 2. Moderate cortical volume loss and scattered small vessel ischemic microangiopathy. 3. Periapical abscess at the root of the right first maxillary molar.   Electronically Signed   By: Garald Balding M.D.   On: 09/23/2014 20:42   Pelvis Portable  09/24/2014   CLINICAL DATA:  Status post right femoral intertrochanteric nail  EXAM: PORTABLE PELVIS 1-2 VIEWS  COMPARISON:  Right hip radiograph 09/23/2014  FINDINGS: Interval operative reduction of the intertrochanteric right femur fracture, with placement  of a partially visualized femoral nail and dynamic hip screw.  No new osseous abnormality detected. Pelvic clips presumably from prostatectomy.  IMPRESSION: No unexpected findings after intertrochanteric right femur fracture ORIF.   Electronically Signed   By: Monte Fantasia M.D.   On: 09/24/2014 15:49   Dg C-arm 1-60 Min  09/24/2014   CLINICAL DATA:  Placement of an chew medullary rod and compression screw for an intertrochanteric right femoral fracture.  EXAM: DG C-ARM 61-120 MIN; RIGHT FEMUR 2 VIEWS  FLUOROSCOPY TIME:  Fluoroscopy Time (in minutes and seconds): 2 minutes, 38 seconds  Number of Acquired Images:  6  COMPARISON:  Preoperative studies of the right hip of September 23, 2014  FINDINGS: Fluoro spot films obtained prior to and following placement of an intra  medullary rod and telescoping screw are reviewed. Radiographic positioning of the rod and screw is good. Alignment of the fracture is near anatomic.  IMPRESSION: There is no immediate postprocedure complication following ORIF for an intertrochanteric fracture of the right hip.   Electronically Signed   By: David  Martinique   On: 09/24/2014 14:23   Dg Hip Unilat With Pelvis 2-3 Views Right  09/23/2014   CLINICAL DATA:  Fall from bed with right hip pain  EXAM: RIGHT HIP (WITH PELVIS) 2-3 VIEWS  COMPARISON:  None.  FINDINGS: There is a comminuted intratrochanteric right proximal femoral fracture with impaction and angulation at the fracture site. Pelvic ring is intact. No other focal abnormality is seen.  IMPRESSION: Intratrochanteric right femoral fracture.   Electronically Signed   By: Inez Catalina M.D.   On: 09/23/2014 20:51   Dg Femur, Min 2 Views Right  09/24/2014   CLINICAL DATA:  ORIF.  EXAM: RIGHT FEMUR 2 VIEWS  COMPARISON:  09/24/2014.  FINDINGS: Patient status post open reduction internal fixation right intertrochanteric hip fracture. Good anatomic alignment. Peripheral vascular calcification .  IMPRESSION: ORIF right intertrochanteric hip  fracture with good anatomic alignment. Hardware intact .   Electronically Signed   By: Marcello Moores  Register   On: 09/24/2014 15:47   Dg Femur, Min 2 Views Right  09/24/2014   CLINICAL DATA:  Placement of an chew medullary rod and compression screw for an intertrochanteric right femoral fracture.  EXAM: DG C-ARM 61-120 MIN; RIGHT FEMUR 2 VIEWS  FLUOROSCOPY TIME:  Fluoroscopy Time (in minutes and seconds): 2 minutes, 38 seconds  Number of Acquired Images:  6  COMPARISON:  Preoperative studies of the right hip of September 23, 2014  FINDINGS: Fluoro spot films obtained prior to and following placement of an intra medullary rod and telescoping screw are reviewed. Radiographic positioning of the rod and screw is good. Alignment of the fracture is near anatomic.  IMPRESSION: There is no immediate postprocedure complication following ORIF for an intertrochanteric fracture of the right hip.   Electronically Signed   By: David  Martinique   On: 09/24/2014 14:23   Dg Femur, Min 2 Views Right  09/23/2014   CLINICAL DATA:  Hip pain secondary to a fall off of his bed today.  EXAM: RIGHT FEMUR 2 VIEWS  COMPARISON:  None.  FINDINGS: There is a comminuted intertrochanteric fracture of the proximal right femur. The distal femur is intact. There is osteoarthritis of the knee with medial joint space narrowing.  IMPRESSION: Intertrochanteric fracture of the proximal right femur with slight angulation and displacement.   Electronically Signed   By: Lorriane Shire M.D.   On: 09/23/2014 20:52    Medications:  Scheduled: . aspirin  81 mg Oral Daily  . atorvastatin  40 mg Oral Daily  . ciprofloxacin  400 mg Intravenous Q12H  . docusate sodium  100 mg Oral BID  . enoxaparin (LOVENOX) injection  40 mg Subcutaneous Q24H  . metoprolol succinate  50 mg Oral Q breakfast  . ramipril  5 mg Oral Daily  . saccharomyces boulardii  250 mg Oral BID  . senna  2 tablet Oral QHS  . sodium chloride  3 mL Intravenous Q12H  . Warfarin - Pharmacist  Dosing Inpatient   Does not apply q1800   Continuous:  CXK:GYJEHUDJSHFWY **OR** acetaminophen, alum & mag hydroxide-simeth, HYDROcodone-acetaminophen, menthol-cetylpyridinium **OR** phenol, metoCLOPramide **OR** metoCLOPramide (REGLAN) injection, morphine injection, ondansetron **OR** ondansetron (ZOFRAN) IV, ondansetron **OR** ondansetron (ZOFRAN) IV  Assessment/Plan:  Principal  Problem:   Intertrochanteric fracture of right hip Active Problems:   Essential hypertension   Atrial fibrillation   Congestive heart failure   Long term current use of anticoagulant therapy   History of stroke   HLD (hyperlipidemia)   Leukocytosis   Intertrochanteric fracture of right femur     Right hip fracture Patient is status post ORIF. He seems to be doing well postoperatively. Pain control as needed. Further management per orthopedics. PT and OT has been ordered.  Chronic Systolic congestive heart failure 2-D echo on 11/27/2007 showed EF 35-40%. It is compensated on admission. Monitor volume status closely.  Essential Hypertension Blood pressure remains stable. Continue current medications.   Chronic Atrial Fibrillation Rate is well controlled. Continue Metoprolol. Patient was on warfarin, which was held for surgery. He was actually given vitamin K to reverse his INR. Warfarin was resumed yesterday. Due to his high risk for thromboembolism we will provide bridging with heparin. CHA2DS2-VASc Score is 6.   History of stroke Stable. Continue Lipitor and aspirin  Hyperlipidemia Continue Lipitor.   UTI with Escherichia coli Penicillin allergy was discussed with the patient. He denies any history of anaphylaxis. Ciprofloxacin can interact significantly with warfarin. In view of this, we will initiate [place]. Monitor him closely for any adverse reactions. Await the final culture sensitivities.  DVT Prophylaxis: On IV heparin and warfarin.  Code Status: Full code  Family Communication: Discussed  with the patient  Disposition Plan: Per PT and OT. Will also need to wait for therapeutic INR.    LOS: 2 days   [facility] Pager [phone] 09/25/2014, 9:28 AM  If 7PM-7AM, please contact night-coverage at [email or web address], password [facility]

## 2014-09-25 NOTE — Clinical Social Work Note (Signed)
CSW consult acknowledged:  PT currently recommending Home Health services. Clinical Social Worker will sign off for now as social work intervention is no longer needed. Please consult [place] again if new need arises.  Glendon Axe, MSW, LCSWA [phone] 09/25/2014 11:40 AM

## 2014-09-25 NOTE — Progress Notes (Signed)
Aaron Frye  MRN: [id number] DOB/Age: [birth year] 81 y.o. Physician: Rada Hay Procedure: Procedure(s) (LRB): INTRAMEDULLARY (IM) NAIL INTERTROCHANTRIC (Right)     Subjective: Awake and alert. No specific complaints. Pain well controlled.   Vital Signs Temp:  [97 F (36.1 C)-99.5 F (37.5 C)] 98.3 F (36.8 C) (03/26 0317) Pulse Rate:  [72-144] 91 (03/26 0450) Resp:  [12-20] 12 (03/26 0353) BP: (108-132)/(47-70) 115/50 mmHg (03/26 0317) SpO2:  [92 %-97 %] 96 % (03/26 0450) Weight:  [83.961 kg (185 lb 1.6 oz)] 83.961 kg (185 lb 1.6 oz) (03/26 0450)  Lab Results  Recent Labs  09/24/14 0507 09/25/14 0542  WBC 14.5* 15.8*  HGB 11.8* 10.3*  HCT 36.9* 32.6*  PLT 184 156   BMET  Recent Labs  09/24/14 0507 09/25/14 0542  NA 137 136  K 3.9 4.1  CL 105 102  CO2 26 28  GLUCOSE 169* 175*  BUN 18 19  CREATININE 0.78 0.89  CALCIUM 8.7 8.3*   POC INR  Date Value Ref Range Status  08/08/2010 3.4     INR  Date Value Ref Range Status  09/25/2014 1.51* 0.00 - 1.49 Final  09/14/2014 2.7  Final     Exam Right hip incisions are all dry with no drainage to mepilex dressings. Thigh soft. Moves feet and ankles well.        Plan Mobilize with PT today. Daughter at bedside, discussed that his disposition plans depend on his initial progress with therapy. WBAT  Shaneequa Bahner ,PA-C 09/25/2014, 9:38 AM

## 2014-09-25 NOTE — Progress Notes (Signed)
ANTICOAGULATION CONSULT NOTE - Follow Up Consult  Pharmacy Consult for heparin Indication: atrial fibrillation  Allergies  Allergen Reactions  . Penicillins     REACTION: unspecified    Patient Measurements: Height: 5\' 7"  (170.2 cm) Weight: 185 lb 1.6 oz (83.961 kg) IBW/kg (Calculated) : 66.1 Heparin Dosing Weight: 74 kg  Vital Signs: Temp: 99.7 F (37.6 C) (03/26 1944) Temp Source: Oral (03/26 1944) BP: 130/48 mmHg (03/26 1944) Pulse Rate: 95 (03/26 1944)  Labs:  Recent Labs  09/23/14 1930 09/24/14 0049 09/24/14 0507 09/25/14 0542 09/25/14 1830  HGB 13.4  --  11.8* 10.3*  --   HCT 42.4  --  36.9* 32.6*  --   PLT 186  --  184 156  --   APTT  --  33  --   --   --   LABPROT 24.6*  --  21.9* 18.4*  --   INR 2.20*  --  1.89* 1.51*  --   HEPARINUNFRC  --   --   --   --  0.73*  CREATININE 0.79  --  0.78 0.89  --     Estimated Creatinine Clearance: 60.6 mL/min (by C-G formula based on Cr of 0.89).   Medications:  Scheduled:  . aspirin  81 mg Oral Daily  . atorvastatin  40 mg Oral Daily  . cefpodoxime  200 mg Oral Q12H  . docusate sodium  100 mg Oral BID  . metoprolol succinate  50 mg Oral Q breakfast  . ramipril  5 mg Oral Daily  . saccharomyces boulardii  250 mg Oral BID  . senna  2 tablet Oral QHS  . sodium chloride  3 mL Intravenous Q12H  . Warfarin - Pharmacist Dosing Inpatient   Does not apply q1800   Infusions:  . heparin 1,200 Units/hr (09/25/14 1215)    Assessment: 81 yo male with afib is currently on supratherapeutic heparin.  Heparin level was 0.73 Goal of Therapy:  Heparin level 0.3-0.7 units/ml Monitor platelets by anticoagulation protocol: Yes   Plan:  Reduce heparin to 1100 units/hr 8hr heparin level  Vyla Pint, Tsz-Yin 09/25/2014,7:47 PM

## 2014-09-26 LAB — BASIC METABOLIC PANEL
Anion gap: 4 — ABNORMAL LOW (ref 5–15)
BUN: 26 mg/dL — ABNORMAL HIGH (ref 6–23)
CHLORIDE: 99 mmol/L (ref 96–112)
CO2: 28 mmol/L (ref 19–32)
CREATININE: 0.95 mg/dL (ref 0.50–1.35)
Calcium: 8.3 mg/dL — ABNORMAL LOW (ref 8.4–10.5)
GFR calc non Af Amer: 73 mL/min — ABNORMAL LOW (ref >90)
GFR, EST AFRICAN AMERICAN: 84 mL/min — AB (ref >90)
GLUCOSE: 161 mg/dL — AB (ref 70–99)
Potassium: 3.8 mmol/L (ref 3.5–5.1)
SODIUM: 131 mmol/L — AB (ref 135–145)

## 2014-09-26 LAB — CBC
HCT: 30.2 % — ABNORMAL LOW (ref 39.0–52.0)
Hemoglobin: 9.7 g/dL — ABNORMAL LOW (ref 13.0–17.0)
MCH: 25.2 pg — ABNORMAL LOW (ref 26.0–34.0)
MCHC: 32.1 g/dL (ref 30.0–36.0)
MCV: 78.4 fL (ref 78.0–100.0)
PLATELETS: 147 10*3/uL — AB (ref 150–400)
RBC: 3.85 MIL/uL — ABNORMAL LOW (ref 4.22–5.81)
RDW: 16.3 % — ABNORMAL HIGH (ref 11.5–15.5)
WBC: 16.7 10*3/uL — ABNORMAL HIGH (ref 4.0–10.5)

## 2014-09-26 LAB — PROTIME-INR
INR: 1.41 (ref 0.00–1.49)
PROTHROMBIN TIME: 17.4 s — AB (ref 11.6–15.2)

## 2014-09-26 LAB — HEPARIN LEVEL (UNFRACTIONATED)
HEPARIN UNFRACTIONATED: 0.47 [IU]/mL (ref 0.30–0.70)
Heparin Unfractionated: 0.37 IU/mL (ref 0.30–0.70)

## 2014-09-26 LAB — GLUCOSE, CAPILLARY: Glucose-Capillary: 162 mg/dL — ABNORMAL HIGH (ref 70–99)

## 2014-09-26 MED ORDER — WARFARIN SODIUM 5 MG PO TABS
5.0000 mg | ORAL_TABLET | Freq: Once | ORAL | Status: AC
  Administered 2014-09-26: 5 mg via ORAL
  Filled 2014-09-26: qty 1

## 2014-09-26 NOTE — Progress Notes (Addendum)
Physical Therapy Treatment Patient Details Name: Aaron Frye MRN: [id number] DOB: [birth year] Today's Date: 09/26/2014    History of Present Illness Pt is a 81 y.o. very active male who fell at home and fx femur. s/p Rt IM nail.     PT Comments    Pt requiring incr (A) today with mobility. D/C recommendation updated to CIR. PTA pt very independent and working at his floral shop 6 days a week. Pt has good family support and would benefit from intensive rehab prior to D/C home with wife to maximize functional mobility prior to returning home.   Follow Up Recommendations  CIR     Equipment Recommendations  None recommended by PT    Recommendations for Other Services OT consult;Rehab consult     Precautions / Restrictions Precautions Precautions: Fall Restrictions Weight Bearing Restrictions: Yes RLE Weight Bearing: Weight bearing as tolerated    Mobility  Bed Mobility Overal bed mobility: Needs Assistance Bed Mobility: Supine to Sit     Supine to sit: Mod assist;HOB elevated     General bed mobility comments: pt cont with incr difficulty with bed mobility; (A) to bring Rt LE off EOB and to elevate trunk; incr time and max cues  Transfers Overall transfer level: Needs assistance Equipment used: Rolling walker (2 wheeled) Transfers: Sit to/from Stand Sit to Stand: Max assist         General transfer comment: pt requiring max (A) to stand today due to fatigue and pain; max cueing for sequencing and use of anterior rock technique to shift weight onto feet  Ambulation/Gait Ambulation/Gait assistance: Mod assist Ambulation Distance (Feet): 20 Feet Assistive device: Rolling walker (2 wheeled) Gait Pattern/deviations: Step-to pattern;Decreased stance time - right;Decreased step length - left;Shuffle;Trunk flexed;Narrow base of support Gait velocity: very decr Gait velocity interpretation: Below normal speed for age/gender General Gait Details: pt requiring mod (A)  to balance with incr mobility; pt with multiple bouts of LOB posteriorly; max cueing for safety and use of RW; pt c/o dizziness with directional changes   Stairs            Wheelchair Mobility    Modified Rankin (Stroke Patients Only)       Balance Overall balance assessment: Needs assistance Sitting-balance support: Feet supported;Bilateral upper extremity supported;Single extremity supported Sitting balance-Leahy Scale: Poor Sitting balance - Comments: pt requiring UE bracing to maintain balance; unsteady at EOB Postural control: Posterior lean;Left lateral lean Standing balance support: Bilateral upper extremity supported;During functional activity Standing balance-Leahy Scale: Poor Standing balance comment: mod to max (A) for balance                    Cognition Arousal/Alertness: Awake/alert Behavior During Therapy: Flat affect Overall Cognitive Status: Impaired/Different from baseline Area of Impairment: Problem solving;Following commands       Following Commands: Follows one step commands with increased time     Problem Solving: Slow processing;Difficulty sequencing;Requires verbal cues;Requires tactile cues General Comments: could be attributed to [facility], incr time for commands    Exercises General Exercises - Lower Extremity Ankle Circles/Pumps: AROM;Both;10 reps Long Arc Quad: Right;10 reps;Seated;AAROM Heel Slides: AAROM;Right;10 reps;Supine    General Comments General comments (skin integrity, edema, etc.): discussed CIR recommendation with pt      Pertinent Vitals/Pain Pain Assessment: 0-10 Pain Score: 2  Pain Location: Rt hip with movement Pain Descriptors / Indicators: Grimacing;Aching Pain Intervention(s): Monitored during session;Premedicated before session;Repositioned    Home Living  Prior Function            PT Goals (current goals can now be found in the care plan section) Acute Rehab PT  Goals Patient Stated Goal: to exercise this leg more PT Goal Formulation: With patient/family Time For Goal Achievement: 10/02/14 Potential to Achieve Goals: Good Progress towards PT goals: Progressing toward goals    Frequency  Min 4X/week    PT Plan Discharge plan needs to be updated    Co-evaluation             End of Session Equipment Utilized During Treatment: Gait belt Activity Tolerance: Patient limited by fatigue;Patient limited by pain Patient left: in chair;with call bell/phone within reach     Time: 0805-0829 PT Time Calculation (min) (ACUTE ONLY): 24 min  Charges:  $Gait Training: 8-22 mins $Therapeutic Exercise: 8-22 mins                    G CodesGustavus Bryant PT [phone] 09/26/2014, 9:08 AM

## 2014-09-26 NOTE — Progress Notes (Signed)
Utilization review completed.  

## 2014-09-26 NOTE — Progress Notes (Signed)
ANTICOAGULATION CONSULT NOTE - Follow Up Consult  Pharmacy Consult for heparin Indication: atrial fibrillation  Labs:  Recent Labs  09/23/14 1930 09/24/14 0049 09/24/14 0507 09/25/14 0542 09/25/14 1830 09/26/14 0425 09/26/14 0440  HGB 13.4  --  11.8* 10.3*  --  9.7*  --   HCT 42.4  --  36.9* 32.6*  --  30.2*  --   PLT 186  --  184 156  --  147*  --   APTT  --  33  --   --   --   --   --   LABPROT 24.6*  --  21.9* 18.4*  --   --  17.4*  INR 2.20*  --  1.89* 1.51*  --   --  1.41  HEPARINUNFRC  --   --   --   --  0.73*  --  0.47  CREATININE 0.79  --  0.78 0.89  --   --   --       Assessment: 81yo male now therapeutic on heparin after rate decrease.  Goal of Therapy:  Heparin level 0.3-0.7 units/ml   Plan:  Will continue gtt at current rate and confirm stable with additional level.  Wynona Neat, PharmD, BCPS  09/26/2014,5:36 AM

## 2014-09-26 NOTE — Progress Notes (Signed)
Subjective: 2 Days Post-Op Procedure(s) (LRB): INTRAMEDULLARY (IM) NAIL INTERTROCHANTRIC (Right) Patient reports pain as mild and moderate.   Patient seen in rounds with Dr. Lyla Glassing. Patient is well, but has had some minor complaints of pain in the right thigh, requiring pain medications We will continue therapy today.  Disposition as per Medicine Service - Home versus short SNF depending upon progress.  Objective: Vital signs in last 24 hours: Temp:  [98.5 F (36.9 C)-99.7 F (37.6 C)] 99 F (37.2 C) (03/27 0603) Pulse Rate:  [92-95] 92 (03/27 0603) Resp:  [17-18] 18 (03/27 0603) BP: (97-130)/(42-48) 105/45 mmHg (03/27 0603) SpO2:  [94 %-96 %] 94 % (03/27 0603) Weight:  [82.827 kg (182 lb 9.6 oz)] 82.827 kg (182 lb 9.6 oz) (03/27 0603)  Intake/Output from previous day: 03/26 0701 - 03/27 0700 In: 600 [P.O.:600] Out: 200 [Urine:200]  Recent Labs  09/23/14 1930 09/24/14 0507 09/25/14 0542 09/26/14 0425  HGB 13.4 11.8* 10.3* 9.7*    Recent Labs  09/25/14 0542 09/26/14 0425  WBC 15.8* 16.7*  RBC 4.08* 3.85*  HCT 32.6* 30.2*  PLT 156 147*    Recent Labs  09/25/14 0542 09/26/14 0425  NA 136 131*  K 4.1 3.8  CL 102 99  CO2 28 28  BUN 19 26*  CREATININE 0.89 0.95  GLUCOSE 175* 161*  CALCIUM 8.3* 8.3*    Recent Labs  09/25/14 0542 09/26/14 0440  INR 1.51* 1.41    EXAM General - Patient is Alert and Appropriate Extremity - Neurovascular intact Sensation intact distally Dorsiflexion/Plantar flexion intact No cellulitis present Dressing - dressing C/D/I Motor Function - intact, moving foot and toes well on exam. Sone thigh swelling but normal postop due to type of fracture  Past Medical History  Diagnosis Date  . TIA (transient ischemic attack)   . HLD (hyperlipidemia)   . HTN (hypertension)   . CVA (cerebral vascular accident)   . Atrial fibrillation   . Prostate cancer     Assessment/Plan: 2 Days Post-Op Procedure(s) (LRB): INTRAMEDULLARY  (IM) NAIL INTERTROCHANTRIC (Right) Principal Problem:   Intertrochanteric fracture of right hip Active Problems:   Essential hypertension   Atrial fibrillation   Congestive heart failure   Long term current use of anticoagulant therapy   History of stroke   HLD (hyperlipidemia)   Leukocytosis   Intertrochanteric fracture of right femur  Estimated body mass index is 28.59 kg/(m^2) as calculated from the following:   Height as of this encounter: 5\' 7"  (1.702 m).   Weight as of this encounter: 82.827 kg (182 lb 9.6 oz). Up with therapy  DVT Prophylaxis - Aspirin and Coumadin Weight-Bearing as tolerated to right leg Continue with therapy.  Arlee Muslim, PA-C Orthopaedic Surgery 09/26/2014, 7:26 AM

## 2014-09-26 NOTE — Progress Notes (Signed)
ANTICOAGULATION CONSULT NOTE - Follow Up Consult  Pharmacy Consult for coumadin and heparin Indication: atrial fibrillation  Allergies  Allergen Reactions  . Penicillins     REACTION: unspecified    Patient Measurements: Height: 5\' 7"  (170.2 cm) Weight: 182 lb 9.6 oz (82.827 kg) IBW/kg (Calculated) : 66.1 Heparin Dosing Weight:   Vital Signs: Temp: 99 F (37.2 C) (03/27 0603) Temp Source: Oral (03/27 0603) BP: 105/45 mmHg (03/27 0603) Pulse Rate: 92 (03/27 0603)  Labs:  Recent Labs  09/24/14 0049 09/24/14 0507 09/25/14 0542 09/25/14 1830 09/26/14 0425 09/26/14 0440  HGB  --  11.8* 10.3*  --  9.7*  --   HCT  --  36.9* 32.6*  --  30.2*  --   PLT  --  184 156  --  147*  --   APTT 33  --   --   --   --   --   LABPROT  --  21.9* 18.4*  --   --  17.4*  INR  --  1.89* 1.51*  --   --  1.41  HEPARINUNFRC  --   --   --  0.73*  --  0.47  CREATININE  --  0.78 0.89  --  0.95  --     Estimated Creatinine Clearance: 56.4 mL/min (by C-G formula based on Cr of 0.95).   Medications:  Scheduled:  . aspirin  81 mg Oral Daily  . atorvastatin  40 mg Oral Daily  . cefpodoxime  200 mg Oral Q12H  . docusate sodium  100 mg Oral BID  . metoprolol succinate  50 mg Oral Q breakfast  . ramipril  5 mg Oral Daily  . saccharomyces boulardii  250 mg Oral BID  . senna  2 tablet Oral QHS  . sodium chloride  3 mL Intravenous Q12H  . Warfarin - Pharmacist Dosing Inpatient   Does not apply q1800   Infusions:  . heparin 1,100 Units/hr (09/26/14 0940)    Assessment: 81 yo male with afib is currently on subtherapeutic coumadin bridging with therapeutic heparin.  INR today is 1.41 and heparin level is 0.37.   Goal of Therapy:  Heparin level 0.3-0.7 units/ml; INR 2-3 Monitor platelets by anticoagulation protocol: Yes   Plan:  Warfarin 5 mg po today Continue heparin @ 1100 units/hr INR, CBC and heparin level in am  Rohan Juenger, Tsz-Yin 09/26/2014,11:20 AM

## 2014-09-26 NOTE — Progress Notes (Signed)
[facility] PROGRESS NOTE  Aaron Frye GNF:[id number] DOB: Jan 15, 1925 DOA: 09/23/2014  PCP: Stephens Shire, MD  Brief HPI: 81 year old Caucasian male with a past medical history of atrial fibrillation on Coumadin, TIA, stroke, hypertension, history of prostate cancer, history of systolic congestive heart failure with EF of about 35-40%, presented after a mechanical fall resulting in fracture of his right hip. He was admitted for further management.  Past medical history:  Past Medical History  Diagnosis Date  . TIA (transient ischemic attack)   . HLD (hyperlipidemia)   . HTN (hypertension)   . CVA (cerebral vascular accident)   . Atrial fibrillation   . Prostate cancer     Consultants: Orthopedics  Procedures:  Open reduction internal fixation of the right hip fracture 3/25  Antibiotics: Ciprofloxacin 3/25--3/26 Vantin 3/26  Subjective: Patient denies any pain. He was able to ambulate with the help of physical therapy. No shortness of breath. No chest pain. Denies any nausea, vomiting. Did have some hematuria yesterday which has resolved.  Objective: Vital Signs  Filed Vitals:   09/25/14 2000 09/26/14 0000 09/26/14 0400 09/26/14 0603  BP:    105/45  Pulse:    92  Temp:    99 F (37.2 C)  TempSrc:    Oral  Resp: 18 18 18 18   Height:      Weight:    82.827 kg (182 lb 9.6 oz)  SpO2: 96% 96% 96% 94%    Intake/Output Summary (Last 24 hours) at 09/26/14 0840 Last data filed at 09/26/14 0604  Gross per 24 hour  Intake    360 ml  Output    200 ml  Net    160 ml   Filed Weights   09/24/14 0500 09/25/14 0450 09/26/14 0603  Weight: 75.342 kg (166 lb 1.6 oz) 83.961 kg (185 lb 1.6 oz) 82.827 kg (182 lb 9.6 oz)    General appearance: alert, cooperative, appears stated age and no distress Resp: clear to auscultation bilaterally Cardio: S1, S2 is irregularly irregular. No S3, S4. No rubs, murmurs, or bruit. No pedal edema. GI: soft, non-tender; bowel  sounds normal; no masses,  no organomegaly Extremities: Right hip area was examined. No bruising. No swelling. Not fully mobilize.  Pulses: 2+ and symmetric Neurologic: Alert and oriented 3. No focal neurological deficits noted.  Lab Results:  Basic Metabolic Panel:  Recent Labs Lab 09/23/14 1930 09/24/14 0507 09/25/14 0542 09/26/14 0425  NA 137 137 136 131*  K 4.1 3.9 4.1 3.8  CL 100 105 102 99  CO2 26 26 28 28   GLUCOSE 159* 169* 175* 161*  BUN 20 18 19  26*  CREATININE 0.79 0.78 0.89 0.95  CALCIUM 9.3 8.7 8.3* 8.3*   CBC:  Recent Labs Lab 09/23/14 1930 09/24/14 0507 09/25/14 0542 09/26/14 0425  WBC 15.6* 14.5* 15.8* 16.7*  NEUTROABS 13.1*  --   --   --   HGB 13.4 11.8* 10.3* 9.7*  HCT 42.4 36.9* 32.6* 30.2*  MCV 79.0 79.0 79.9 78.4  PLT 186 184 156 147*   CBG:  Recent Labs Lab 09/24/14 0635 09/25/14 0636 09/26/14 0643  GLUCAP 149* 169* 162*    Recent Results (from the past 240 hour(s))  Urine culture     Status: None   Collection Time: 09/23/14  8:22 PM  Result Value Ref Range Status   Specimen Description URINE, RANDOM  Final   Special Requests NONE  Final   Colony Count   Final    >=100,000  COLONIES/ML Performed at [facility] Performed at [facility]    Report Status 09/25/2014 FINAL  Final   Organism ID, Bacteria ESCHERICHIA COLI  Final      Susceptibility   Escherichia coli - MIC*    AMPICILLIN 4 SENSITIVE Sensitive     CEFAZOLIN <=4 SENSITIVE Sensitive     CEFTRIAXONE <=1 SENSITIVE Sensitive     CIPROFLOXACIN <=0.25 SENSITIVE Sensitive     GENTAMICIN <=1 SENSITIVE Sensitive     LEVOFLOXACIN <=0.12 SENSITIVE Sensitive     NITROFURANTOIN <=16 SENSITIVE Sensitive     TOBRAMYCIN <=1 SENSITIVE Sensitive     TRIMETH/SULFA <=20 SENSITIVE Sensitive     PIP/TAZO <=4 SENSITIVE Sensitive     * ESCHERICHIA COLI  Surgical pcr screen     Status: None   Collection Time: 09/24/14  2:00 AM    Result Value Ref Range Status   MRSA, PCR NEGATIVE NEGATIVE Final   Staphylococcus aureus NEGATIVE NEGATIVE Final    Comment:        The Xpert SA Assay (FDA approved for NASAL specimens in patients over 72 years of age), is one component of a comprehensive surveillance program.  Test performance has been validated by [facility] for patients greater than or equal to 79 year old. It is not intended to diagnose infection nor to guide or monitor treatment.       Studies/Results: Pelvis Portable  09/24/2014   CLINICAL DATA:  Status post right femoral intertrochanteric nail  EXAM: PORTABLE PELVIS 1-2 VIEWS  COMPARISON:  Right hip radiograph 09/23/2014  FINDINGS: Interval operative reduction of the intertrochanteric right femur fracture, with placement of a partially visualized femoral nail and dynamic hip screw.  No new osseous abnormality detected. Pelvic clips presumably from prostatectomy.  IMPRESSION: No unexpected findings after intertrochanteric right femur fracture ORIF.   Electronically Signed   By: Monte Fantasia M.D.   On: 09/24/2014 15:49   Dg C-arm 1-60 Min  09/24/2014   CLINICAL DATA:  Placement of an chew medullary rod and compression screw for an intertrochanteric right femoral fracture.  EXAM: DG C-ARM 61-120 MIN; RIGHT FEMUR 2 VIEWS  FLUOROSCOPY TIME:  Fluoroscopy Time (in minutes and seconds): 2 minutes, 38 seconds  Number of Acquired Images:  6  COMPARISON:  Preoperative studies of the right hip of September 23, 2014  FINDINGS: Fluoro spot films obtained prior to and following placement of an intra medullary rod and telescoping screw are reviewed. Radiographic positioning of the rod and screw is good. Alignment of the fracture is near anatomic.  IMPRESSION: There is no immediate postprocedure complication following ORIF for an intertrochanteric fracture of the right hip.   Electronically Signed   By: David  Martinique   On: 09/24/2014 14:23   Dg Femur, Min 2 Views  Right  09/24/2014   CLINICAL DATA:  ORIF.  EXAM: RIGHT FEMUR 2 VIEWS  COMPARISON:  09/24/2014.  FINDINGS: Patient status post open reduction internal fixation right intertrochanteric hip fracture. Good anatomic alignment. Peripheral vascular calcification .  IMPRESSION: ORIF right intertrochanteric hip fracture with good anatomic alignment. Hardware intact .   Electronically Signed   By: Marcello Moores  Register   On: 09/24/2014 15:47   Dg Femur, Min 2 Views Right  09/24/2014   CLINICAL DATA:  Placement of an chew medullary rod and compression screw for an intertrochanteric right femoral fracture.  EXAM: DG C-ARM 61-120 MIN; RIGHT FEMUR 2 VIEWS  FLUOROSCOPY TIME:  Fluoroscopy Time (in minutes and seconds): 2 minutes, 38 seconds  Number of Acquired Images:  6  COMPARISON:  Preoperative studies of the right hip of September 23, 2014  FINDINGS: Fluoro spot films obtained prior to and following placement of an intra medullary rod and telescoping screw are reviewed. Radiographic positioning of the rod and screw is good. Alignment of the fracture is near anatomic.  IMPRESSION: There is no immediate postprocedure complication following ORIF for an intertrochanteric fracture of the right hip.   Electronically Signed   By: David  Martinique   On: 09/24/2014 14:23    Medications:  Scheduled: . aspirin  81 mg Oral Daily  . atorvastatin  40 mg Oral Daily  . cefpodoxime  200 mg Oral Q12H  . docusate sodium  100 mg Oral BID  . metoprolol succinate  50 mg Oral Q breakfast  . ramipril  5 mg Oral Daily  . saccharomyces boulardii  250 mg Oral BID  . senna  2 tablet Oral QHS  . sodium chloride  3 mL Intravenous Q12H  . Warfarin - Pharmacist Dosing Inpatient   Does not apply q1800   Continuous: . heparin 1,100 Units/hr (09/25/14 1952)   PNT:[id number] **OR** acetaminophen, alum & mag hydroxide-simeth, HYDROcodone-acetaminophen, menthol-cetylpyridinium **OR** phenol, metoCLOPramide **OR** metoCLOPramide (REGLAN) injection,  morphine injection, ondansetron **OR** ondansetron (ZOFRAN) IV, ondansetron **OR** ondansetron (ZOFRAN) IV  Assessment/Plan:  Principal Problem:   Intertrochanteric fracture of right hip Active Problems:   Essential hypertension   Atrial fibrillation   Congestive heart failure   Long term current use of anticoagulant therapy   History of stroke   HLD (hyperlipidemia)   Leukocytosis   Intertrochanteric fracture of right femur     Right hip fracture Patient is status post ORIF. He seems to be doing well postoperatively. Pain control as needed. Further management per orthopedics. PT and OT has been ordered. Home health has been recommended.  UTI with Escherichia coli Penicillin allergy was discussed with the patient. He denies any history of anaphylaxis. Ciprofloxacin can interact significantly with warfarin. Patient was started on Vantin which he seems to be tolerating well. WBC, however, is noted to be higher. Could be due to stress demargination. Continue to monitor.  Normocytic Anemia Definitely there is a component of post operative blood loss. He also had some hematuria last night, which has resolved. Continue to monitor daily. Transfuse as needed.  Chronic Systolic congestive heart failure 2-D echo on 11/27/2007 showed EF 35-40%. He is compensated on admission. Monitor volume status closely.  Essential Hypertension Blood pressure borderline low at times, but stable. Continue current medications.   Chronic Atrial Fibrillation Rate is well controlled. Continue Metoprolol. Patient was on warfarin, which was held for surgery. He was actually given vitamin K to reverse his INR. Warfarin was resumed . Subsequently. Due to his high risk for thromboembolism . He is on bridging with heparin. CHA2DS2-VASc Score is 6. Await therapeutic INR  History of stroke Stable. Continue Lipitor and aspirin  Hyperlipidemia Continue Lipitor.   Hyponatremia Repeat levels tomorrow.  DVT  Prophylaxis: On IV heparin and warfarin.  Code Status: Full code  Family Communication: Discussed with the patient  Disposition Plan: It appears that he may be able to go home with home health when ready for discharge. Will also need to wait for therapeutic INR.    LOS: 3 days   [facility] Pager [phone] 09/26/2014, 8:40 AM  If 7PM-7AM, please contact night-coverage at [email or web address], password [email or web address]

## 2014-09-26 NOTE — Progress Notes (Signed)
OT Cancellation Note  Patient Details Name: CANDON CARAS MRN: [id number] DOB: [birth year]   Cancelled Treatment:    Reason Eval/Treat Not Completed: OT screened.Pt is Medicare and current D/C plan is SNF. No apparent immediate acute care OT needs, therefore will defer OT to SNF. If OT eval is needed please call Acute Rehab Dept. at [phone] or text page OT at [phone].    Benito Mccreedy OTR/L [phone] 09/26/2014, 8:41 AM

## 2014-09-27 ENCOUNTER — Inpatient Hospital Stay (HOSPITAL_COMMUNITY)
Admission: RE | Admit: 2014-09-27 | Discharge: 2014-10-08 | DRG: 559 | Disposition: A | Discharge status: Home Health | Payer: Medicare Other | Source: Intra-hospital | Attending: Physical Medicine & Rehabilitation | Admitting: Physical Medicine & Rehabilitation

## 2014-09-27 DIAGNOSIS — B9620 Unspecified Escherichia coli [E. coli] as the cause of diseases classified elsewhere: Secondary | ICD-10-CM | POA: Diagnosis present

## 2014-09-27 DIAGNOSIS — Z7901 Long term (current) use of anticoagulants: Secondary | ICD-10-CM

## 2014-09-27 DIAGNOSIS — I429 Cardiomyopathy, unspecified: Secondary | ICD-10-CM | POA: Diagnosis present

## 2014-09-27 DIAGNOSIS — E785 Hyperlipidemia, unspecified: Secondary | ICD-10-CM | POA: Diagnosis present

## 2014-09-27 DIAGNOSIS — S72101D Unspecified trochanteric fracture of right femur, subsequent encounter for closed fracture with routine healing: Principal | ICD-10-CM

## 2014-09-27 DIAGNOSIS — I5033 Acute on chronic diastolic (congestive) heart failure: Secondary | ICD-10-CM | POA: Diagnosis not present

## 2014-09-27 DIAGNOSIS — J811 Chronic pulmonary edema: Secondary | ICD-10-CM

## 2014-09-27 DIAGNOSIS — S72141S Displaced intertrochanteric fracture of right femur, sequela: Secondary | ICD-10-CM

## 2014-09-27 DIAGNOSIS — J040 Acute laryngitis: Secondary | ICD-10-CM | POA: Diagnosis not present

## 2014-09-27 DIAGNOSIS — I481 Persistent atrial fibrillation: Secondary | ICD-10-CM | POA: Diagnosis not present

## 2014-09-27 DIAGNOSIS — R0602 Shortness of breath: Secondary | ICD-10-CM

## 2014-09-27 DIAGNOSIS — I4891 Unspecified atrial fibrillation: Secondary | ICD-10-CM

## 2014-09-27 DIAGNOSIS — R609 Edema, unspecified: Secondary | ICD-10-CM

## 2014-09-27 DIAGNOSIS — R319 Hematuria, unspecified: Secondary | ICD-10-CM | POA: Diagnosis not present

## 2014-09-27 DIAGNOSIS — I10 Essential (primary) hypertension: Secondary | ICD-10-CM | POA: Diagnosis present

## 2014-09-27 DIAGNOSIS — I482 Chronic atrial fibrillation, unspecified: Secondary | ICD-10-CM | POA: Diagnosis present

## 2014-09-27 DIAGNOSIS — N390 Urinary tract infection, site not specified: Secondary | ICD-10-CM | POA: Diagnosis present

## 2014-09-27 DIAGNOSIS — I509 Heart failure, unspecified: Secondary | ICD-10-CM | POA: Diagnosis not present

## 2014-09-27 DIAGNOSIS — I5030 Unspecified diastolic (congestive) heart failure: Secondary | ICD-10-CM | POA: Diagnosis not present

## 2014-09-27 DIAGNOSIS — D62 Acute posthemorrhagic anemia: Secondary | ICD-10-CM | POA: Diagnosis present

## 2014-09-27 DIAGNOSIS — S20212D Contusion of left front wall of thorax, subsequent encounter: Secondary | ICD-10-CM

## 2014-09-27 DIAGNOSIS — I5021 Acute systolic (congestive) heart failure: Secondary | ICD-10-CM | POA: Diagnosis present

## 2014-09-27 DIAGNOSIS — I5032 Chronic diastolic (congestive) heart failure: Secondary | ICD-10-CM | POA: Diagnosis not present

## 2014-09-27 DIAGNOSIS — E8770 Fluid overload, unspecified: Secondary | ICD-10-CM | POA: Diagnosis not present

## 2014-09-27 DIAGNOSIS — I050 Rheumatic mitral stenosis: Secondary | ICD-10-CM | POA: Insufficient documentation

## 2014-09-27 DIAGNOSIS — T148 Other injury of unspecified body region: Secondary | ICD-10-CM | POA: Diagnosis not present

## 2014-09-27 DIAGNOSIS — S72141A Displaced intertrochanteric fracture of right femur, initial encounter for closed fracture: Secondary | ICD-10-CM | POA: Diagnosis present

## 2014-09-27 HISTORY — DX: Unspecified fracture of right femur, initial encounter for closed fracture: S72.91XA

## 2014-09-27 HISTORY — DX: Other cardiomyopathies: I42.8

## 2014-09-27 LAB — BASIC METABOLIC PANEL
Anion gap: 8 (ref 5–15)
BUN: 26 mg/dL — ABNORMAL HIGH (ref 6–23)
CALCIUM: 7.9 mg/dL — AB (ref 8.4–10.5)
CO2: 27 mmol/L (ref 19–32)
Chloride: 98 mmol/L (ref 96–112)
Creatinine, Ser: 0.82 mg/dL (ref 0.50–1.35)
GFR, EST AFRICAN AMERICAN: 90 mL/min — AB (ref >90)
GFR, EST NON AFRICAN AMERICAN: 77 mL/min — AB (ref >90)
GLUCOSE: 152 mg/dL — AB (ref 70–99)
POTASSIUM: 3.6 mmol/L (ref 3.5–5.1)
Sodium: 133 mmol/L — ABNORMAL LOW (ref 135–145)

## 2014-09-27 LAB — CBC
HEMATOCRIT: 26.9 % — AB (ref 39.0–52.0)
Hemoglobin: 8.7 g/dL — ABNORMAL LOW (ref 13.0–17.0)
MCH: 25.3 pg — ABNORMAL LOW (ref 26.0–34.0)
MCHC: 32.3 g/dL (ref 30.0–36.0)
MCV: 78.2 fL (ref 78.0–100.0)
PLATELETS: 155 10*3/uL (ref 150–400)
RBC: 3.44 MIL/uL — ABNORMAL LOW (ref 4.22–5.81)
RDW: 16 % — AB (ref 11.5–15.5)
WBC: 15.2 10*3/uL — AB (ref 4.0–10.5)

## 2014-09-27 LAB — GLUCOSE, CAPILLARY: GLUCOSE-CAPILLARY: 157 mg/dL — AB (ref 70–99)

## 2014-09-27 LAB — RETICULOCYTES
RBC.: 3.44 MIL/uL — AB (ref 4.22–5.81)
Retic Count, Absolute: 44.7 10*3/uL (ref 19.0–186.0)
Retic Ct Pct: 1.3 % (ref 0.4–3.1)

## 2014-09-27 LAB — FERRITIN: Ferritin: 558 ng/mL — ABNORMAL HIGH (ref 22–322)

## 2014-09-27 LAB — IRON AND TIBC
IRON: 10 ug/dL — AB (ref 42–165)
Saturation Ratios: 5 % — ABNORMAL LOW (ref 20–55)
TIBC: 199 ug/dL — ABNORMAL LOW (ref 215–435)
UIBC: 189 ug/dL (ref 125–400)

## 2014-09-27 LAB — FOLATE: Folate: >20 ng/mL

## 2014-09-27 LAB — HEPARIN LEVEL (UNFRACTIONATED)
HEPARIN UNFRACTIONATED: 0.17 [IU]/mL — AB (ref 0.30–0.70)
Heparin Unfractionated: 1.86 IU/mL — ABNORMAL HIGH (ref 0.30–0.70)
Heparin Unfractionated: 0.25 IU/mL — ABNORMAL LOW (ref 0.30–0.70)

## 2014-09-27 LAB — PROTIME-INR
INR: 1.34 (ref 0.00–1.49)
Prothrombin Time: 16.7 seconds — ABNORMAL HIGH (ref 11.6–15.2)

## 2014-09-27 LAB — VITAMIN B12: VITAMIN B 12: 289 pg/mL (ref 211–911)

## 2014-09-27 MED ORDER — SORBITOL 70 % SOLN
30.0000 mL | Freq: Every day | Status: DC | PRN
  Administered 2014-09-28 – 2014-10-04 (×2): 30 mL via ORAL
  Filled 2014-09-27 (×2): qty 30

## 2014-09-27 MED ORDER — CEFPODOXIME PROXETIL 200 MG PO TABS
200.0000 mg | ORAL_TABLET | Freq: Two times a day (BID) | ORAL | Status: DC
  Administered 2014-09-27 – 2014-10-04 (×14): 200 mg via ORAL
  Filled 2014-09-27 (×16): qty 1

## 2014-09-27 MED ORDER — DOCUSATE SODIUM 100 MG PO CAPS
100.0000 mg | ORAL_CAPSULE | Freq: Two times a day (BID) | ORAL | Status: DC
  Administered 2014-09-27 – 2014-09-29 (×4): 100 mg via ORAL
  Filled 2014-09-27 (×6): qty 1

## 2014-09-27 MED ORDER — SENNA 8.6 MG PO TABS
2.0000 | ORAL_TABLET | Freq: Every day | ORAL | Status: DC
  Administered 2014-09-27 – 2014-10-08 (×10): 17.2 mg via ORAL
  Filled 2014-09-27 (×12): qty 2

## 2014-09-27 MED ORDER — RAMIPRIL 5 MG PO CAPS
5.0000 mg | ORAL_CAPSULE | Freq: Every day | ORAL | Status: DC
  Administered 2014-09-28 – 2014-10-08 (×11): 5 mg via ORAL
  Filled 2014-09-27 (×12): qty 1

## 2014-09-27 MED ORDER — WARFARIN SODIUM 7.5 MG PO TABS: 7.5000 mg | ORAL_TABLET | Freq: Once | ORAL | Status: DC

## 2014-09-27 MED ORDER — HEPARIN BOLUS VIA INFUSION
2000.0000 [IU] | Freq: Once | INTRAVENOUS | Status: AC
  Administered 2014-09-27: 2000 [IU] via INTRAVENOUS
  Filled 2014-09-27: qty 2000

## 2014-09-27 MED ORDER — ASPIRIN 81 MG PO CHEW
81.0000 mg | CHEWABLE_TABLET | Freq: Every day | ORAL | Status: DC
Start: 2014-09-28 — End: 2014-10-08
  Administered 2014-09-28 – 2014-10-08 (×11): 81 mg via ORAL
  Filled 2014-09-27 (×12): qty 1

## 2014-09-27 MED ORDER — SACCHAROMYCES BOULARDII 250 MG PO CAPS
250.0000 mg | ORAL_CAPSULE | Freq: Two times a day (BID) | ORAL | Status: DC
  Administered 2014-09-27 – 2014-10-08 (×22): 250 mg via ORAL
  Filled 2014-09-27 (×24): qty 1

## 2014-09-27 MED ORDER — METOPROLOL SUCCINATE ER 50 MG PO TB24
50.0000 mg | ORAL_TABLET | Freq: Every day | ORAL | Status: DC
  Administered 2014-09-28 – 2014-10-08 (×11): 50 mg via ORAL
  Filled 2014-09-27 (×12): qty 1

## 2014-09-27 MED ORDER — ONDANSETRON HCL 4 MG PO TABS: 4.0000 mg | ORAL_TABLET | Freq: Four times a day (QID) | ORAL | Status: DC | PRN

## 2014-09-27 MED ORDER — HYDROCODONE-ACETAMINOPHEN 5-325 MG PO TABS
1.0000 | ORAL_TABLET | Freq: Four times a day (QID) | ORAL | Status: DC | PRN
  Administered 2014-09-27 (×2): 2 via ORAL
  Administered 2014-09-28 – 2014-09-30 (×4): 1 via ORAL
  Administered 2014-10-01 – 2014-10-02 (×3): 2 via ORAL
  Filled 2014-09-27: qty 2
  Filled 2014-09-27 (×3): qty 1
  Filled 2014-09-27: qty 2
  Filled 2014-09-27: qty 1
  Filled 2014-09-27: qty 2
  Filled 2014-09-27: qty 1
  Filled 2014-09-27: qty 2
  Filled 2014-09-27 (×2): qty 1

## 2014-09-27 MED ORDER — WARFARIN - PHARMACIST DOSING INPATIENT
Freq: Every day | Status: DC
  Filled 2014-09-27: qty 1

## 2014-09-27 MED ORDER — HEPARIN (PORCINE) IN NACL 100-0.45 UNIT/ML-% IJ SOLN
1500.0000 [IU]/h | INTRAMUSCULAR | Status: DC
  Administered 2014-09-27: 1450 [IU]/h via INTRAVENOUS
  Administered 2014-09-28: 1600 [IU]/h via INTRAVENOUS
  Administered 2014-09-29 – 2014-10-02 (×5): 1500 [IU]/h via INTRAVENOUS
  Filled 2014-09-27 (×11): qty 250

## 2014-09-27 MED ORDER — ACETAMINOPHEN 325 MG PO TABS
650.0000 mg | ORAL_TABLET | Freq: Four times a day (QID) | ORAL | Status: DC | PRN
  Administered 2014-09-29 – 2014-10-05 (×5): 650 mg via ORAL
  Filled 2014-09-27 (×5): qty 2

## 2014-09-27 MED ORDER — ATORVASTATIN CALCIUM 40 MG PO TABS
40.0000 mg | ORAL_TABLET | Freq: Every day | ORAL | Status: DC
  Administered 2014-09-28 – 2014-10-08 (×11): 40 mg via ORAL
  Filled 2014-09-27 (×12): qty 1

## 2014-09-27 MED ORDER — ONDANSETRON HCL 4 MG/2ML IJ SOLN: 4.0000 mg | Freq: Four times a day (QID) | INTRAMUSCULAR | Status: DC | PRN

## 2014-09-27 MED ORDER — ACETAMINOPHEN 650 MG RE SUPP: 650.0000 mg | Freq: Four times a day (QID) | RECTAL | Status: DC | PRN

## 2014-09-27 MED ORDER — WARFARIN SODIUM 7.5 MG PO TABS
7.5000 mg | ORAL_TABLET | Freq: Once | ORAL | Status: AC
  Administered 2014-09-27: 7.5 mg via ORAL
  Filled 2014-09-27: qty 1

## 2014-09-27 NOTE — Progress Notes (Addendum)
ANTICOAGULATION CONSULT NOTE - Follow Up Consult  Pharmacy Consult for coumadin and heparin Indication: atrial fibrillation  Allergies  Allergen Reactions  . Penicillins     REACTION: unspecified    Patient Measurements: Weight: 178 lb 4.8 oz (80.876 kg)  Ht 67 in  IBW: 66 kg Heparin Dosing Weight: 80 kg  Vital Signs: Temp: 97.6 F (36.4 C) (03/28 1656) Temp Source: Oral (03/28 1656) BP: 133/55 mmHg (03/28 1656) Pulse Rate: 96 (03/28 1656)  Labs:  Recent Labs  09/25/14 0542  09/26/14 0425 09/26/14 0440 09/26/14 1105 09/27/14 0550 09/27/14 1512  HGB 10.3*  --  9.7*  --   --  8.7*  --   HCT 32.6*  --  30.2*  --   --  26.9*  --   PLT 156  --  147*  --   --  155  --   LABPROT 18.4*  --   --  17.4*  --  16.7*  --   INR 1.51*  --   --  1.41  --  1.34  --   HEPARINUNFRC  --   < >  --  0.47 0.37 0.17* 1.86*  CREATININE 0.89  --  0.95  --   --  0.82  --   < > = values in this interval not displayed.  Estimated Creatinine Clearance: 64.6 mL/min (by C-G formula based on Cr of 0.82).   Assessment: 81 yo male on coumadin PTA for afib -reversed with vitamin K 10 mg po on 03/24 for ortho surgery 3/25. INR 1.34 - no real movement since coumadin restarted 3/25. Pt on heparin for bridging. Heparin level 1.86 (likely drawn incorrectly although pt not sure. Level has been therapeutic the past few days and slightly low this a.m.)   Home coumadin dose: 5mg  daily except for 2.5mg  on Tuesday.    Goal of Therapy:  Heparin level 0.3-0.7 units/ml; INR 2-3 Monitor platelets by anticoagulation protocol: Yes   Plan:  Warfarin 7.5 mg po today Continue heparin @ 1300 units/hr Will recheck STAT heparin level INR, CBC and heparin level daily  Sherlon Handing, PharmD, BCPS Clinical pharmacist, pager [phone] 09/27/2014,5:28 PM    Addendum: Repeat heparin level 0.25 (subtherapeutic). Appears that earlier level was drawn from the same arm where heparin was infusing.  Plan: Increase heparin  to 1450 units/hr F/u 8 hour heparin level   Sherlon Handing, PharmD, BCPS Clinical pharmacist, pager [phone] 09/27/2014 7:05 PM

## 2014-09-27 NOTE — Discharge Instructions (Signed)
Hip Fracture °A hip fracture is a fracture of the upper part of your thigh bone (femur).  °CAUSES °A hip fracture is caused by a direct blow to the side of your hip. This is usually the result of a fall but can occur in other circumstances, such as an automobile accident. °RISK FACTORS °There is an increased risk of hip fractures in people with: °· An unsteady walking pattern (gait) and those with conditions that contribute to poor balance, such as Parkinson's disease or dementia. °· Osteopenia and osteoporosis. °· Cancer that spreads to the leg bones. °· Certain metabolic diseases. °SYMPTOMS  °Symptoms of hip fracture include: °· Pain over the injured hip. °· Inability to put weight on the leg in which the fracture occurred (although, some patients are able to walk after a hip fracture). °· Toes and foot of the affected leg point outward when you lie down. °DIAGNOSIS °A physical exam can determine if a hip fracture is likely to have occurred. X-ray exams are needed to confirm the fracture and to look for other injuries. The X-ray exam can help to determine the type of hip fracture. Rarely, the fracture is not visible on an X-ray image and a CT scan or MRI will have to be done. °TREATMENT  °The treatment for a fracture is usually surgery. This means using a screw, nail, or rod to hold the bones in place.  °HOME CARE INSTRUCTIONS °Take all medicines as directed by your health care provider. °SEEK MEDICAL CARE IF: °Pain continues, even after taking pain medicine. °MAKE SURE YOU: °· Understand these instructions.   °· Will watch your condition. °· Will get help right away if you are not doing well or get worse. °Document Released: 06/18/2005 Document Revised: 06/23/2013 Document Reviewed: 01/28/2013 °[employer]® Patient Information ©2015 [employer]. This information is not intended to replace advice given to you by your health care provider. Make sure you discuss any questions you have with your health care  provider. ° °

## 2014-09-27 NOTE — Progress Notes (Addendum)
[facility] PROGRESS NOTE  LENDON GEORGE MOQ:[id number] DOB: 02-25-1925 DOA: 09/23/2014  PCP: Stephens Shire, MD  Brief HPI: 81 year old Caucasian male with a past medical history of atrial fibrillation on Coumadin, TIA, stroke, hypertension, history of prostate cancer, history of systolic congestive heart failure with EF of about 35-40%, presented after a mechanical fall resulting in fracture of his right hip. He was admitted for further management. Patient underwent hip surgery on March 25.  Past medical history:  Past Medical History  Diagnosis Date  . TIA (transient ischemic attack)   . HLD (hyperlipidemia)   . HTN (hypertension)   . CVA (cerebral vascular accident)   . Atrial fibrillation   . Prostate cancer     Consultants: Orthopedics  Procedures:  Open reduction internal fixation of the right hip fracture 3/25  Antibiotics: Ciprofloxacin 3/25--3/26 Vantin 3/26  Subjective: Patient continues to do well. Denies any chest pain or shortness of breath. No significant pain in the right hip area.   Objective: Vital Signs  Filed Vitals:   09/26/14 1600 09/26/14 2013 09/27/14 0452 09/27/14 0655  BP:  118/53  111/48  Pulse:  99  103  Temp:  98.6 F (37 C)  98.3 F (36.8 C)  TempSrc:  Oral  Oral  Resp: 16     Height:      Weight:   84.959 kg (187 lb 4.8 oz)   SpO2:  96%  97%    Intake/Output Summary (Last 24 hours) at 09/27/14 0927 Last data filed at 09/27/14 0411  Gross per 24 hour  Intake    480 ml  Output   1500 ml  Net  -1020 ml   Filed Weights   09/25/14 0450 09/26/14 0603 09/27/14 0452  Weight: 83.961 kg (185 lb 1.6 oz) 82.827 kg (182 lb 9.6 oz) 84.959 kg (187 lb 4.8 oz)    General appearance: alert, cooperative, appears stated age and no distress Resp: clear to auscultation bilaterally Cardio: S1, S2 is irregularly irregular. No S3, S4. No rubs, murmurs, or bruit. No pedal edema. GI: soft, non-tender; bowel sounds normal; no masses,  no  organomegaly Extremities: Right hip area was examined. No bruising. No swelling. Not fully mobilize.  Neurologic: Alert and oriented 3. No focal neurological deficits noted.  Lab Results:  Basic Metabolic Panel:  Recent Labs Lab 09/23/14 1930 09/24/14 0507 09/25/14 0542 09/26/14 0425 09/27/14 0550  NA 137 137 136 131* 133*  K 4.1 3.9 4.1 3.8 3.6  CL 100 105 102 99 98  CO2 26 26 28 28 27   GLUCOSE 159* 169* 175* 161* 152*  BUN 20 18 19  26* 26*  CREATININE 0.79 0.78 0.89 0.95 0.82  CALCIUM 9.3 8.7 8.3* 8.3* 7.9*   CBC:  Recent Labs Lab 09/23/14 1930 09/24/14 0507 09/25/14 0542 09/26/14 0425 09/27/14 0550  WBC 15.6* 14.5* 15.8* 16.7* 15.2*  NEUTROABS 13.1*  --   --   --   --   HGB 13.4 11.8* 10.3* 9.7* 8.7*  HCT 42.4 36.9* 32.6* 30.2* 26.9*  MCV 79.0 79.0 79.9 78.4 78.2  PLT 186 184 156 147* 155   CBG:  Recent Labs Lab 09/24/14 0635 09/25/14 0636 09/26/14 0643 09/27/14 0654  GLUCAP 149* 169* 162* 157*    Recent Results (from the past 240 hour(s))  Urine culture     Status: None   Collection Time: 09/23/14  8:22 PM  Result Value Ref Range Status   Specimen Description URINE, RANDOM  Final   Special Requests  NONE  Final   Colony Count   Final    >=100,000 COLONIES/ML Performed at [facility]    Culture   Final    ESCHERICHIA COLI Performed at [facility]    Report Status 09/25/2014 FINAL  Final   Organism ID, Bacteria ESCHERICHIA COLI  Final      Susceptibility   Escherichia coli - MIC*    AMPICILLIN 4 SENSITIVE Sensitive     CEFAZOLIN <=4 SENSITIVE Sensitive     CEFTRIAXONE <=1 SENSITIVE Sensitive     CIPROFLOXACIN <=0.25 SENSITIVE Sensitive     GENTAMICIN <=1 SENSITIVE Sensitive     LEVOFLOXACIN <=0.12 SENSITIVE Sensitive     NITROFURANTOIN <=16 SENSITIVE Sensitive     TOBRAMYCIN <=1 SENSITIVE Sensitive     TRIMETH/SULFA <=20 SENSITIVE Sensitive     PIP/TAZO <=4 SENSITIVE Sensitive     * ESCHERICHIA COLI  Surgical pcr  screen     Status: None   Collection Time: 09/24/14  2:00 AM  Result Value Ref Range Status   MRSA, PCR NEGATIVE NEGATIVE Final   Staphylococcus aureus NEGATIVE NEGATIVE Final    Comment:        The Xpert SA Assay (FDA approved for NASAL specimens in patients over 40 years of age), is one component of a comprehensive surveillance program.  Test performance has been validated by [facility] for patients greater than or equal to 63 year old. It is not intended to diagnose infection nor to guide or monitor treatment.       Studies/Results: No results found.  Medications:  Scheduled: . aspirin  81 mg Oral Daily  . atorvastatin  40 mg Oral Daily  . cefpodoxime  200 mg Oral Q12H  . docusate sodium  100 mg Oral BID  . heparin  2,000 Units Intravenous Once  . metoprolol succinate  50 mg Oral Q breakfast  . ramipril  5 mg Oral Daily  . saccharomyces boulardii  250 mg Oral BID  . senna  2 tablet Oral QHS  . sodium chloride  3 mL Intravenous Q12H  . Warfarin - Pharmacist Dosing Inpatient   Does not apply q1800   Continuous: . heparin 1,100 Units/hr (09/26/14 0940)   WUJ:WJXBJYNWGNFAO **OR** acetaminophen, alum & mag hydroxide-simeth, HYDROcodone-acetaminophen, menthol-cetylpyridinium **OR** phenol, metoCLOPramide **OR** metoCLOPramide (REGLAN) injection, morphine injection, ondansetron **OR** ondansetron (ZOFRAN) IV, ondansetron **OR** ondansetron (ZOFRAN) IV  Assessment/Plan:  Principal Problem:   Intertrochanteric fracture of right hip Active Problems:   Essential hypertension   Atrial fibrillation   Congestive heart failure   Long term current use of anticoagulant therapy   History of stroke   HLD (hyperlipidemia)   Leukocytosis   Intertrochanteric fracture of right femur     Right hip fracture Patient is status post ORIF. He seems to be doing well postoperatively. Pain control as needed. Initially, home health was recommended, but the yesterday this assessment  changed. It was felt that patient may benefit from inpatient rehabilitation. Rehabilitation physician has been consulted.  UTI with Escherichia coli Penicillin allergy was discussed with the patient. He denies any history of anaphylaxis. Ciprofloxacin can interact significantly with warfarin. Patient was started on Vantin which he seems to be tolerating well. WBC, however, was noted to be higher. Could be due to stress demargination. It is better today. He remains afebrile.   Normocytic Anemia Hemoglobin continues to be lower. Most likely a combination up for operative loss and dilution. He also had a brief episode of hematuria. Otherwise, no other  overt bleeding has been noted. Continue to monitor. Transfuse as needed.  Chronic Systolic congestive heart failure 2-D echo on 11/27/2007 showed EF 35-40%. He is compensated on admission. Monitor volume status closely.  Essential Hypertension Blood pressure borderline low at times, but stable. Continue current medications.   Chronic Atrial Fibrillation Rate is well controlled. Continue Metoprolol. Patient was on warfarin, which was held for surgery. He was actually given vitamin K to reverse his INR. Warfarin was resumed subsequently. Due to his high risk for thromboembolism he is on bridging with heparin. CHA2DS2-VASc Score is 6. Await therapeutic INR.  History of stroke Stable. Continue Lipitor and aspirin  Hyperlipidemia Continue Lipitor.   Hyponatremia Repeat levels tomorrow.  ADDENDUM Notified by the rehabilitation coordinator that they have a bed available in inpatient rehabilitation. They are able to provide bridging with IV heparin until INR is therapeutic. Patient and family are agreeable to go to rehabilitation. Hence, he will be discharged.  DVT Prophylaxis: On IV heparin and warfarin.  Code Status: Full code  Family Communication: Discussed with the patient  Disposition Plan: Await input from rehabilitation. Continue IV heparin  and warfarin. Await therapeutic INR.   LOS: 4 days   [facility] Pager [phone] 09/27/2014, 9:27 AM  If 7PM-7AM, please contact night-coverage at [email or web address], password [facility]

## 2014-09-27 NOTE — Progress Notes (Signed)
Physical Medicine and Rehabilitation Consult Reason for Consult: Right intertrochanteric femur fracture Referring Physician: [facility]   HPI: Aaron Frye is a 81 y.o. right handed male with history of systolic congestive heart failure, hypertension, CVA, atrial fibrillation with chronic Coumadin. Independent prior to admission living with his wife. Presented 09/23/2014 after a fall landing on his right side. Denied any loss of consciousness. Cranial CT scan negative. INR on admission of 2.20. Coumadin was reversed and patient underwent IM nailing 09/24/2014 of right intertrochanteric femur fracture per Dr.Swinteck. Weightbearing as tolerated right lower extremity. Hospital course pain management. Chronic Coumadin has been resumed with heparin for bridging. Acute blood loss anemia 9.7 and monitored. Physical and occupational therapy evaluations completed with recommendations of physical medicine rehabilitation consult.   Review of Systems  Cardiovascular: Positive for palpitations.  Gastrointestinal: Positive for constipation.  Musculoskeletal: Positive for myalgias.  All other systems reviewed and are negative.  Past Medical History  Diagnosis Date  . TIA (transient ischemic attack)   . HLD (hyperlipidemia)   . HTN (hypertension)   . CVA (cerebral vascular accident)   . Atrial fibrillation   . Prostate cancer    Past Surgical History  Procedure Laterality Date  . Appendectomy    . Tonsillectomy     Family History  Problem Relation Age of Onset  . Heart failure Mother   . Lung disease Father   . Cancer Brother     Not sure which type of cancer   Social History:  reports that he has never smoked. He does not have any smokeless tobacco history on file. He reports that he does not drink alcohol or use illicit drugs. Allergies:  Allergies  Allergen Reactions  . Penicillins     REACTION: unspecified   Medications Prior  to Admission  Medication Sig Dispense Refill  . aspirin 81 MG tablet Take 81 mg by mouth daily.     [name] atorvastatin (LIPITOR) 40 MG tablet TAKE ONE TABLET BY MOUTH ONCE DAILY 90 tablet 0  . hydrochlorothiazide (MICROZIDE) 12.5 MG capsule TAKE ONE CAPSULE BY MOUTH ONCE DAILY IN THE MORNING 90 capsule 1  . metoprolol succinate (TOPROL-XL) 100 MG 24 hr tablet Take 50 mg by mouth daily. Take with or immediately following a meal.    . ramipril (ALTACE) 5 MG capsule TAKE ONE CAPSULE BY MOUTH ONCE DAILY 30 capsule 5  . warfarin (COUMADIN) 5 MG tablet Take 2.5-5 mg by mouth See admin instructions. Take 5mg  every day except on Tuesday take 2.5mg  per patient    . metoprolol succinate (TOPROL-XL) 100 MG 24 hr tablet TAKE ONE-HALF TABLET BY MOUTH ONCE DAILY (Patient not taking: Reported on 09/23/2014) 45 tablet 0  . warfarin (COUMADIN) 5 MG tablet Take as directed by coumadin clinic (Patient not taking: Reported on 09/23/2014) 100 tablet 0    Home: Home Living Family/patient expects to be discharged to:: Private residence Living Arrangements: Spouse/significant other, Children Available Help at Discharge: Family, Available 24 hours/day Type of Home: House Home Access: Ramped entrance Home Layout: One level Home Equipment: Environmental consultant - 2 wheels, Shower seat  Functional History: Prior Function Level of Independence: Independent Functional Status:  Mobility: Bed Mobility Overal bed mobility: Needs Assistance Bed Mobility: Supine to Sit Supine to sit: Mod assist, HOB elevated General bed mobility comments: pt cont with incr difficulty with bed mobility; (A) to bring Rt LE off EOB and to elevate trunk; incr time and max cues Transfers Overall transfer level: Needs assistance Equipment used: Rolling walker (  2 wheeled) Transfers: Sit to/from Stand Sit to Stand: Max assist Stand pivot transfers: Min assist, From elevated surface General transfer comment: pt  requiring max (A) to stand today due to fatigue and pain; max cueing for sequencing and use of anterior rock technique to shift weight onto feet Ambulation/Gait Ambulation/Gait assistance: Mod assist Ambulation Distance (Feet): 20 Feet Assistive device: Rolling walker (2 wheeled) Gait Pattern/deviations: Step-to pattern, Decreased stance time - right, Decreased step length - left, Shuffle, Trunk flexed, Narrow base of support Gait velocity: very decr Gait velocity interpretation: Below normal speed for age/gender General Gait Details: pt requiring mod (A) to balance with incr mobility; pt with multiple bouts of LOB posteriorly; max cueing for safety and use of RW; pt c/o dizziness with directional changes    ADL:    Cognition: Cognition Overall Cognitive Status: Impaired/Different from baseline Orientation Level: Oriented X4 Cognition Arousal/Alertness: Awake/alert Behavior During Therapy: Flat affect Overall Cognitive Status: Impaired/Different from baseline Area of Impairment: Problem solving, Following commands Current Attention Level: Sustained Following Commands: Follows one step commands with increased time Problem Solving: Slow processing, Difficulty sequencing, Requires verbal cues, Requires tactile cues General Comments: could be attributed to [facility], incr time for commands  Blood pressure 118/53, pulse 99, temperature 98.6 F (37 C), temperature source Oral, resp. rate 16, height 5\' 7"  (1.702 m), weight 84.959 kg (187 lb 4.8 oz), SpO2 96 %. Physical Exam  Constitutional: He is oriented to person, place, and time. He appears well-developed.  HENT:  Head: Normocephalic and atraumatic.  Eyes: Conjunctivae and EOM are normal. Pupils are equal, round, and reactive to light.  Neck: Normal range of motion. Neck supple. Tracheal deviation present. No thyromegaly present.  Cardiovascular: Normal rate and regular rhythm.  No murmur heard. Cardiac rate controlled  Respiratory:  Effort normal and breath sounds normal. No respiratory distress. He has no wheezes. He has no rales.  GI: Soft. Bowel sounds are normal. He exhibits no distension.  Musculoskeletal: He exhibits edema.  Neurological: He is alert and oriented to person, place, and time.  Right leg limited by pain/ortho issues. UE 5/5. LLE 4/5 hf,ke 5/5 ankle. No sensory deficits  Skin:  Hip incision is dressed appropriately tender. Chronic vascular changes in the legs.  Psychiatric: He has a normal mood and affect. His behavior is normal.     Lab Results Last 24 Hours    Results for orders placed or performed during the hospital encounter of 09/23/14 (from the past 24 hour(s))  Glucose, capillary Status: Abnormal   Collection Time: 09/26/14 6:43 AM  Result Value Ref Range   Glucose-Capillary 162 (H) 70 - 99 mg/dL  Heparin level (unfractionated) Status: None   Collection Time: 09/26/14 11:05 AM  Result Value Ref Range   Heparin Unfractionated 0.37 0.30 - 0.70 IU/mL      Imaging Results (Last 48 hours)    No results found.    Assessment/Plan: Diagnosis: right intertrochanteric fracture after fall 1. Does the need for close, 24 hr/day medical supervision in concert with the patient's rehab needs make it unreasonable for this patient to be served in a less intensive setting? Yes 2. Co-Morbidities requiring supervision/potential complications: htn, afib, chf, prior cva, 3. Due to bladder management, bowel management, safety, skin/wound care, disease management, medication administration, pain management and patient education, does the patient require 24 hr/day rehab nursing? Yes 4. Does the patient require coordinated care of a physician, rehab nurse, PT (1-2 hrs/day, 5 days/week) and OT (1-2 hrs/day, 5 days/week) to  address physical and functional deficits in the context of the above medical diagnosis(es)? Yes Addressing deficits in the following areas: balance, endurance,  locomotion, strength, transferring, bowel/bladder control, bathing, dressing, feeding, grooming, toileting and psychosocial support 5. Can the patient actively participate in an intensive therapy program of at least 3 hrs of therapy per day at least 5 days per week? Yes 6. The potential for patient to make measurable gains while on inpatient rehab is excellent 7. Anticipated functional outcomes upon discharge from inpatient rehab are modified independent with PT, modified independent and supervision with OT, n/a with SLP. 8. Estimated rehab length of stay to reach the above functional goals is: 7-10 days 9. Does the patient have adequate social supports and living environment to accommodate these discharge functional goals? Yes 10. Anticipated D/C setting: Home 11. Anticipated post D/C treatments: HH therapy, Outpatient therapy and Home excercise program 12. Overall Rehab/Functional Prognosis: excellent  RECOMMENDATIONS: This patient's condition is appropriate for continued rehabilitative care in the following setting: CIR Patient has agreed to participate in recommended program. Yes Note that insurance prior authorization may be required for reimbursement for recommended care.  Comment: Pt is motivated. Wife is available for supervision. Rehab Admissions Coordinator to follow up.  Thanks,  Meredith Staggers, MD, Mellody Drown     09/27/2014       Revision History     Date/Time User Provider Type Action   09/27/2014 10:49 AM Meredith Staggers, MD Physician Sign   09/27/2014 6:56 AM Cathlyn Parsons, PA-C Physician Assistant Pend   View Details Report       Routing History     Date/Time From To Method   09/27/2014 10:49 AM Meredith Staggers, MD Meredith Staggers, MD In Basket   09/27/2014 10:49 AM Meredith Staggers, MD Stephens Shire, MD Fax

## 2014-09-27 NOTE — Plan of Care (Signed)
Problem: Consults Goal: RH GENERAL PATIENT EDUCATION See Patient Education module for education specifics. Outcome: Progressing With supervision/verb cues  Problem: RH BOWEL ELIMINATION Goal: RH STG MANAGE BOWEL W/MEDICATION W/ASSISTANCE STG Manage Bowel with Medication with Assistance. Outcome: Not Progressing With medication assist  Problem: RH BLADDER ELIMINATION Goal: RH STG MANAGE BLADDER WITH ASSISTANCE STG Manage Bladder With Assistance Outcome: Progressing Min assist  Problem: RH SKIN INTEGRITY Goal: RH STG SKIN FREE OF INFECTION/BREAKDOWN Outcome: Progressing supervision Goal: RH STG MAINTAIN SKIN INTEGRITY WITH ASSISTANCE STG Maintain Skin Integrity With Assistance. Outcome: Progressing Verb cues  Problem: RH SAFETY Goal: RH STG ADHERE TO SAFETY PRECAUTIONS W/ASSISTANCE/DEVICE STG Adhere to Safety Precautions With Assistance/Device. Outcome: Progressing Supervision/verb cues  Problem: RH PAIN MANAGEMENT Goal: RH STG PAIN MANAGED AT OR BELOW PT'S PAIN GOAL Outcome: Progressing Min assist

## 2014-09-27 NOTE — Progress Notes (Signed)
I met with pt and his wife at bedside. We discussed an inpt rehab stay and they are both in agreement I contacted Dr. [place] Pink and he is in agreement to d/c to inpt rehab today. We can manage the heparin IV. I will make the arrangements for admission today. [phone]

## 2014-09-27 NOTE — Progress Notes (Signed)
ANTICOAGULATION CONSULT NOTE - Follow Up Consult  Pharmacy Consult for heparin Indication: atrial fibrillation  Labs:  Recent Labs  09/25/14 0542  09/26/14 0425 09/26/14 0440 09/26/14 1105 09/27/14 0550  HGB 10.3*  --  9.7*  --   --  8.7*  HCT 32.6*  --  30.2*  --   --  26.9*  PLT 156  --  147*  --   --  155  LABPROT 18.4*  --   --  17.4*  --  16.7*  INR 1.51*  --   --  1.41  --  1.34  HEPARINUNFRC  --   < >  --  0.47 0.37 0.17*  CREATININE 0.89  --  0.95  --   --  0.82  < > = values in this interval not displayed.    Assessment: 81yo male now subtherapeutic on heparin after two levels at goal though had been trending down.  Goal of Therapy:  Heparin level 0.3-0.7 units/ml   Plan:  Will rebolus with heparin 2000 units and increase gtt by 2-33 units/kg/hr to 1300 units/hr and check level in [place], PharmD, BCPS  09/27/2014,7:12 AM

## 2014-09-27 NOTE — H&P (View-Only) (Signed)
Physical Medicine and Rehabilitation Admission H&P    Chief Complaint  Patient presents with  . Fall  . Hip Pain  : HPI: Aaron Frye is a 81 y.o. right handed male with history of systolic congestive heart failure, hypertension, CVA, atrial fibrillation with chronic Coumadin. Independent prior to admission living with his wife. Presented 09/23/2014 after a fall landing on his right side. Denied any loss of consciousness. Cranial CT scan negative. INR on admission of 2.20. Coumadin was reversed and patient underwent IM nailing 09/24/2014 of right intertrochanteric femur fracture per Dr.Swinteck. Weightbearing as tolerated right lower extremity. Hospital course pain management. Chronic Coumadin has been resumed with heparin for bridging. Acute blood loss anemia 8.7 and monitored. Escherichia coli urinary tract infection maintained on Vantin 200 mg twice a day initiated 09/25/2014. Physical and occupational therapy evaluations completed with recommendations of physical medicine rehabilitation consult. Patient was admitted for comprehensive rehabilitation program  ROS Review of Systems  Cardiovascular: Positive for palpitations.  Gastrointestinal: Positive for constipation.  Musculoskeletal: Positive for myalgias.  All other systems reviewed and are negative   Past Medical History  Diagnosis Date  . TIA (transient ischemic attack)   . HLD (hyperlipidemia)   . HTN (hypertension)   . CVA (cerebral vascular accident)   . Atrial fibrillation   . Prostate cancer    Past Surgical History  Procedure Laterality Date  . Appendectomy    . Tonsillectomy     Family History  Problem Relation Age of Onset  . Heart failure Mother   . Lung disease Father   . Cancer Brother     Not sure which type of cancer   Social History:  reports that he has never smoked. He does not have any smokeless tobacco history on file. He reports that he does not drink alcohol or use illicit drugs. Allergies:   Allergies  Allergen Reactions  . Penicillins     REACTION: unspecified   Medications Prior to Admission  Medication Sig Dispense Refill  . aspirin 81 MG tablet Take 81 mg by mouth daily.      [name] atorvastatin (LIPITOR) 40 MG tablet TAKE ONE TABLET BY MOUTH ONCE DAILY 90 tablet 0  . hydrochlorothiazide (MICROZIDE) 12.5 MG capsule TAKE ONE CAPSULE BY MOUTH ONCE DAILY IN THE MORNING 90 capsule 1  . metoprolol succinate (TOPROL-XL) 100 MG 24 hr tablet Take 50 mg by mouth daily. Take with or immediately following a meal.    . ramipril (ALTACE) 5 MG capsule TAKE ONE CAPSULE BY MOUTH ONCE DAILY 30 capsule 5  . warfarin (COUMADIN) 5 MG tablet Take 2.5-5 mg by mouth See admin instructions. Take 74m every day except on Tuesday take 2.5100mper patient    . metoprolol succinate (TOPROL-XL) 100 MG 24 hr tablet TAKE ONE-HALF TABLET BY MOUTH ONCE DAILY (Patient not taking: Reported on 09/23/2014) 45 tablet 0  . warfarin (COUMADIN) 5 MG tablet Take as directed by coumadin clinic (Patient not taking: Reported on 09/23/2014) 100 tablet 0    Home: Home Living Family/patient expects to be discharged to:: Private residence Living Arrangements: Spouse/significant other, Children Available Help at Discharge: Family, Available 24 hours/day Type of Home: House Home Access: Ramped entrance Home Layout: One level Home Equipment: WaEnvironmental consultant 2 wheels, Shower seat   Functional History: Prior Function Level of Independence: Independent  Functional Status:  Mobility: Bed Mobility Overal bed mobility: Needs Assistance Bed Mobility: Supine to Sit Supine to sit: Mod assist, HOB elevated General bed mobility  comments: pt cont with incr difficulty with bed mobility; (A) to bring Rt LE off EOB and to elevate trunk; incr time and max cues Transfers Overall transfer level: Needs assistance Equipment used: Rolling walker (2 wheeled) Transfers: Sit to/from Stand Sit to Stand: Max assist Stand pivot transfers: Min  assist, From elevated surface General transfer comment: pt requiring max (A) to stand today due to fatigue and pain; max cueing for sequencing and use of anterior rock technique to shift weight onto feet Ambulation/Gait Ambulation/Gait assistance: Mod assist Ambulation Distance (Feet): 20 Feet Assistive device: Rolling walker (2 wheeled) Gait Pattern/deviations: Step-to pattern, Decreased stance time - right, Decreased step length - left, Shuffle, Trunk flexed, Narrow base of support Gait velocity: very decr Gait velocity interpretation: Below normal speed for age/gender General Gait Details: pt requiring mod (A) to balance with incr mobility; pt with multiple bouts of LOB posteriorly; max cueing for safety and use of RW; pt c/o dizziness with directional changes    ADL:    Cognition: Cognition Overall Cognitive Status: Impaired/Different from baseline Orientation Level: Oriented X4 Cognition Arousal/Alertness: Awake/alert Behavior During Therapy: Flat affect Overall Cognitive Status: Impaired/Different from baseline Area of Impairment: Problem solving, Following commands Current Attention Level: Sustained Following Commands: Follows one step commands with increased time Problem Solving: Slow processing, Difficulty sequencing, Requires verbal cues, Requires tactile cues General Comments: could be attributed to [facility], incr time for commands  Physical Exam: Blood pressure 111/48, pulse 103, temperature 98.3 F (36.8 C), temperature source Oral, resp. rate 16, height _0  (1.702 m), weight 84.959 kg (187 lb 4.8 oz), SpO2 97 %. Physical Exam Constitutional: He is oriented to person, place, and time. He appears well-developed.  HENT:  Head: Normocephalic and atraumatic.  Eyes: Conjunctivae and EOM are normal. Pupils are equal, round, and reactive to light.  Neck: Normal range of motion. Neck supple. Tracheal deviation present. No thyromegaly present.  Cardiovascular: Normal rate and  regular rhythm.  No murmur heard. Cardiac rate controlled  Respiratory: Effort normal and breath sounds normal. No respiratory distress. He has no wheezes. He has no rales.  GI: Soft. Bowel sounds are normal. He exhibits no distension.  Musculoskeletal: He exhibits edema.  Neurological: He is alert and oriented to person, place, and time.  Right leg limited by pain/ortho issues. UE 5/5. LLE 4/5 hf,ke 5/5 ankle. No sensory deficits  Skin:  Hip incisions are dressed and limb is appropriately tender. Chronic vascular changes in the distal legs.  Psychiatric: He has a normal mood and affect. His behavior is normal   Results for orders placed or performed during the hospital encounter of 09/23/14 (from the past 48 hour(s))  Heparin level (unfractionated)     Status: Abnormal   Collection Time: 09/25/14  6:30 PM  Result Value Ref Range   Heparin Unfractionated 0.73 (H) 0.30 - 0.70 IU/mL    Comment:        IF HEPARIN RESULTS ARE BELOW EXPECTED VALUES, AND PATIENT DOSAGE HAS BEEN CONFIRMED, SUGGEST FOLLOW UP TESTING OF ANTITHROMBIN III LEVELS.   CBC     Status: Abnormal   Collection Time: 09/26/14  4:25 AM  Result Value Ref Range   WBC 16.7 (H) 4.0 - 10.5 K/uL   RBC 3.85 (L) 4.22 - 5.81 MIL/uL   Hemoglobin 9.7 (L) 13.0 - 17.0 g/dL   HCT 30.2 (L) 39.0 - 52.0 %   MCV 78.4 78.0 - 100.0 fL   MCH 25.2 (L) 26.0 - 34.0 pg   MCHC  32.1 30.0 - 36.0 g/dL   RDW 16.3 (H) 11.5 - 15.5 %   Platelets 147 (L) 150 - 400 K/uL  Basic metabolic panel     Status: Abnormal   Collection Time: 09/26/14  4:25 AM  Result Value Ref Range   Sodium 131 (L) 135 - 145 mmol/L   Potassium 3.8 3.5 - 5.1 mmol/L   Chloride 99 96 - 112 mmol/L   CO2 28 19 - 32 mmol/L   Glucose, Bld 161 (H) 70 - 99 mg/dL   BUN 26 (H) 6 - 23 mg/dL   Creatinine, Ser 0.95 0.50 - 1.35 mg/dL   Calcium 8.3 (L) 8.4 - 10.5 mg/dL   GFR calc non Af Amer 73 (L) >90 mL/min   GFR calc Af Amer 84 (L) >90 mL/min    Comment: (NOTE) The eGFR has  been calculated using the CKD EPI equation. This calculation has not been validated in all clinical situations. eGFR's persistently <90 mL/min signify possible Chronic Kidney Disease.    Anion gap 4 (L) 5 - 15  Heparin level (unfractionated)     Status: None   Collection Time: 09/26/14  4:40 AM  Result Value Ref Range   Heparin Unfractionated 0.47 0.30 - 0.70 IU/mL    Comment:        IF HEPARIN RESULTS ARE BELOW EXPECTED VALUES, AND PATIENT DOSAGE HAS BEEN CONFIRMED, SUGGEST FOLLOW UP TESTING OF ANTITHROMBIN III LEVELS.   Protime-INR     Status: Abnormal   Collection Time: 09/26/14  4:40 AM  Result Value Ref Range   Prothrombin Time 17.4 (H) 11.6 - 15.2 seconds   INR 1.41 0.00 - 1.49  Glucose, capillary     Status: Abnormal   Collection Time: 09/26/14  6:43 AM  Result Value Ref Range   Glucose-Capillary 162 (H) 70 - 99 mg/dL  Heparin level (unfractionated)     Status: None   Collection Time: 09/26/14 11:05 AM  Result Value Ref Range   Heparin Unfractionated 0.37 0.30 - 0.70 IU/mL    Comment:        IF HEPARIN RESULTS ARE BELOW EXPECTED VALUES, AND PATIENT DOSAGE HAS BEEN CONFIRMED, SUGGEST FOLLOW UP TESTING OF ANTITHROMBIN III LEVELS.   CBC     Status: Abnormal   Collection Time: 09/27/14  5:50 AM  Result Value Ref Range   WBC 15.2 (H) 4.0 - 10.5 K/uL   RBC 3.44 (L) 4.22 - 5.81 MIL/uL   Hemoglobin 8.7 (L) 13.0 - 17.0 g/dL   HCT 26.9 (L) 39.0 - 52.0 %   MCV 78.2 78.0 - 100.0 fL   MCH 25.3 (L) 26.0 - 34.0 pg   MCHC 32.3 30.0 - 36.0 g/dL   RDW 16.0 (H) 11.5 - 15.5 %   Platelets 155 150 - 400 K/uL  Protime-INR     Status: Abnormal   Collection Time: 09/27/14  5:50 AM  Result Value Ref Range   Prothrombin Time 16.7 (H) 11.6 - 15.2 seconds   INR 1.34 0.00 - 1.49  Heparin level (unfractionated)     Status: Abnormal   Collection Time: 09/27/14  5:50 AM  Result Value Ref Range   Heparin Unfractionated 0.17 (L) 0.30 - 0.70 IU/mL    Comment:        IF HEPARIN RESULTS  ARE BELOW EXPECTED VALUES, AND PATIENT DOSAGE HAS BEEN CONFIRMED, SUGGEST FOLLOW UP TESTING OF ANTITHROMBIN III LEVELS.   Basic metabolic panel     Status: Abnormal   Collection Time: 09/27/14  5:50 AM  Result Value Ref Range   Sodium 133 (L) 135 - 145 mmol/L   Potassium 3.6 3.5 - 5.1 mmol/L   Chloride 98 96 - 112 mmol/L   CO2 27 19 - 32 mmol/L   Glucose, Bld 152 (H) 70 - 99 mg/dL   BUN 26 (H) 6 - 23 mg/dL   Creatinine, Ser 0.82 0.50 - 1.35 mg/dL   Calcium 7.9 (L) 8.4 - 10.5 mg/dL   GFR calc non Af Amer 77 (L) >90 mL/min   GFR calc Af Amer 90 (L) >90 mL/min    Comment: (NOTE) The eGFR has been calculated using the CKD EPI equation. This calculation has not been validated in all clinical situations. eGFR's persistently <90 mL/min signify possible Chronic Kidney Disease.    Anion gap 8 5 - 15  Reticulocytes     Status: Abnormal   Collection Time: 09/27/14  5:50 AM  Result Value Ref Range   Retic Ct Pct 1.3 0.4 - 3.1 %   RBC. 3.44 (L) 4.22 - 5.81 MIL/uL   Retic Count, Manual 44.7 19.0 - 186.0 K/uL  Glucose, capillary     Status: Abnormal   Collection Time: 09/27/14  6:54 AM  Result Value Ref Range   Glucose-Capillary 157 (H) 70 - 99 mg/dL   No results found.     Medical Problem List and Plan: 1. Functional deficits secondary to right intertrochanteric femur fracture. Status post IM nailing 09/24/2014. Weightbearing as tolerated 2.  DVT Prophylaxis/Anticoagulation: Chronic Coumadin therapy for atrial fibrillation. Intravenous heparin until INR therapeutic. Monitor for any bleeding episodes 3. Pain Management: Hydrocodone. Monitor with increased mobility 4. Acute blood loss anemia. Follow-up CBC 5. Neuropsych: This patient is capable of making decisions on his own behalf. 6. Skin/Wound Care: Routine skin checks 7. Fluids/Electrolytes/Nutrition: Strict I and O's with follow-up chemistries 8. Hypertension. Altace 5 mg daily, Toprol-XL 50 mg daily. Monitor with increased  mobility 9. Escherichia coli UTI. Vantin 200 mg twice a day initiated 09/25/2014 10. Hyperlipidemia. Lipitor   Post Admission Physician Evaluation: 1. Functional deficits secondary  to right intertrochanteric femur fracture/IMN. 2. Patient is admitted to receive collaborative, interdisciplinary care between the physiatrist, rehab nursing staff, and therapy team. 3. Patient's level of medical complexity and substantial therapy needs in context of that medical necessity cannot be provided at a lesser intensity of care such as a SNF. 4. Patient has experienced substantial functional loss from his/her baseline which was documented above under the "Functional History" and "Functional Status" headings.  Judging by the patient's diagnosis, physical exam, and functional history, the patient has potential for functional progress which will result in measurable gains while on inpatient rehab.  These gains will be of substantial and practical use upon discharge  in facilitating mobility and self-care at the household level. 5. Physiatrist will provide 24 hour management of medical needs as well as oversight of the therapy plan/treatment and provide guidance as appropriate regarding the interaction of the two. 6. 24 hour rehab nursing will assist with bladder management, bowel management, safety, skin/wound care, disease management, medication administration, pain management and patient education  and help integrate therapy concepts, techniques,education, etc. 7. PT will assess and treat for/with: Lower extremity strength, range of motion, stamina, balance, functional mobility, safety, adaptive techniques and equipment, pain mgt, ortho precautions, community reintegration.   Goals are: mod I. 8. OT will assess and treat for/with: ADL's, functional mobility, safety, upper extremity strength, adaptive techniques and equipment, ortho precautions, pain mgt, ego support,  community reintegration.   Goals are: mod I.  Therapy may proceed with showering this patient. 9. SLP will assess and treat for/with: n/a.  Goals are: n/a. 10. Case Management and Social Worker will assess and treat for psychological issues and discharge planning. 11. Team conference will be held weekly to assess progress toward goals and to determine barriers to discharge. 12. Patient will receive at least 3 hours of therapy per day at least 5 days per week. 13. ELOS: 10-12 days       14. Prognosis:  excellent     Meredith Staggers, MD, [place] [facility] 09/27/2014   09/27/2014

## 2014-09-27 NOTE — Progress Notes (Signed)
Received prescreen request for inpatient rehab and noted that rehab consult has already been ordered. Danne Baxter, rehab admission coordinator, will be following up after rehab consult completed. Her number is [phone].  Thanks.  Nanetta Batty, PT Rehabilitation Admissions Coordinator [phone]

## 2014-09-27 NOTE — Consult Note (Signed)
Physical Medicine and Rehabilitation Consult Reason for Consult: Right intertrochanteric femur fracture Referring Physician: [facility]   HPI: Aaron Frye is a 81 y.o. right handed male with history of systolic congestive heart failure, hypertension, CVA, atrial fibrillation with chronic Coumadin. Independent prior to admission living with his wife. Presented 09/23/2014 after a fall landing on his right side. Denied any loss of consciousness. Cranial CT scan negative. INR on admission of 2.20. Coumadin was reversed and patient underwent IM nailing 09/24/2014 of right intertrochanteric femur fracture per Dr.Swinteck. Weightbearing as tolerated right lower extremity. Hospital course pain management. Chronic Coumadin has been resumed with heparin for bridging. Acute blood loss anemia 9.7 and monitored. Physical and occupational therapy evaluations completed with recommendations of physical medicine rehabilitation consult.   Review of Systems  Cardiovascular: Positive for palpitations.  Gastrointestinal: Positive for constipation.  Musculoskeletal: Positive for myalgias.  All other systems reviewed and are negative.  Past Medical History  Diagnosis Date  . TIA (transient ischemic attack)   . HLD (hyperlipidemia)   . HTN (hypertension)   . CVA (cerebral vascular accident)   . Atrial fibrillation   . Prostate cancer    Past Surgical History  Procedure Laterality Date  . Appendectomy    . Tonsillectomy     Family History  Problem Relation Age of Onset  . Heart failure Mother   . Lung disease Father   . Cancer Brother     Not sure which type of cancer   Social History:  reports that he has never smoked. He does not have any smokeless tobacco history on file. He reports that he does not drink alcohol or use illicit drugs. Allergies:  Allergies  Allergen Reactions  . Penicillins     REACTION: unspecified   Medications Prior to Admission  Medication Sig Dispense Refill  .  aspirin 81 MG tablet Take 81 mg by mouth daily.      [name] atorvastatin (LIPITOR) 40 MG tablet TAKE ONE TABLET BY MOUTH ONCE DAILY 90 tablet 0  . hydrochlorothiazide (MICROZIDE) 12.5 MG capsule TAKE ONE CAPSULE BY MOUTH ONCE DAILY IN THE MORNING 90 capsule 1  . metoprolol succinate (TOPROL-XL) 100 MG 24 hr tablet Take 50 mg by mouth daily. Take with or immediately following a meal.    . ramipril (ALTACE) 5 MG capsule TAKE ONE CAPSULE BY MOUTH ONCE DAILY 30 capsule 5  . warfarin (COUMADIN) 5 MG tablet Take 2.5-5 mg by mouth See admin instructions. Take 5mg  every day except on Tuesday take 2.5mg  per patient    . metoprolol succinate (TOPROL-XL) 100 MG 24 hr tablet TAKE ONE-HALF TABLET BY MOUTH ONCE DAILY (Patient not taking: Reported on 09/23/2014) 45 tablet 0  . warfarin (COUMADIN) 5 MG tablet Take as directed by coumadin clinic (Patient not taking: Reported on 09/23/2014) 100 tablet 0    Home: Home Living Family/patient expects to be discharged to:: Private residence Living Arrangements: Spouse/significant other, Children Available Help at Discharge: Family, Available 24 hours/day Type of Home: House Home Access: Ramped entrance Home Layout: One level Home Equipment: Environmental consultant - 2 wheels, Shower seat  Functional History: Prior Function Level of Independence: Independent Functional Status:  Mobility: Bed Mobility Overal bed mobility: Needs Assistance Bed Mobility: Supine to Sit Supine to sit: Mod assist, HOB elevated General bed mobility comments: pt cont with incr difficulty with bed mobility; (A) to bring Rt LE off EOB and to elevate trunk; incr time and max cues Transfers Overall transfer level: Needs  assistance Equipment used: Rolling walker (2 wheeled) Transfers: Sit to/from Stand Sit to Stand: Max assist Stand pivot transfers: Min assist, From elevated surface General transfer comment: pt requiring max (A) to stand today due to fatigue and pain; max cueing for sequencing and use of  anterior rock technique to shift weight onto feet Ambulation/Gait Ambulation/Gait assistance: Mod assist Ambulation Distance (Feet): 20 Feet Assistive device: Rolling walker (2 wheeled) Gait Pattern/deviations: Step-to pattern, Decreased stance time - right, Decreased step length - left, Shuffle, Trunk flexed, Narrow base of support Gait velocity: very decr Gait velocity interpretation: Below normal speed for age/gender General Gait Details: pt requiring mod (A) to balance with incr mobility; pt with multiple bouts of LOB posteriorly; max cueing for safety and use of RW; pt c/o dizziness with directional changes    ADL:    Cognition: Cognition Overall Cognitive Status: Impaired/Different from baseline Orientation Level: Oriented X4 Cognition Arousal/Alertness: Awake/alert Behavior During Therapy: Flat affect Overall Cognitive Status: Impaired/Different from baseline Area of Impairment: Problem solving, Following commands Current Attention Level: Sustained Following Commands: Follows one step commands with increased time Problem Solving: Slow processing, Difficulty sequencing, Requires verbal cues, Requires tactile cues General Comments: could be attributed to [facility], incr time for commands  Blood pressure 118/53, pulse 99, temperature 98.6 F (37 C), temperature source Oral, resp. rate 16, height 5\' 7"  (1.702 m), weight 84.959 kg (187 lb 4.8 oz), SpO2 96 %. Physical Exam  Constitutional: He is oriented to person, place, and time. He appears well-developed.  HENT:  Head: Normocephalic and atraumatic.  Eyes: Conjunctivae and EOM are normal. Pupils are equal, round, and reactive to light.  Neck: Normal range of motion. Neck supple. Tracheal deviation present. No thyromegaly present.  Cardiovascular: Normal rate and regular rhythm.   No murmur heard. Cardiac rate controlled  Respiratory: Effort normal and breath sounds normal. No respiratory distress. He has no wheezes. He has no  rales.  GI: Soft. Bowel sounds are normal. He exhibits no distension.  Musculoskeletal: He exhibits edema.  Neurological: He is alert and oriented to person, place, and time.  Right leg limited by pain/ortho issues. UE 5/5. LLE 4/5 hf,ke 5/5 ankle. No sensory deficits  Skin:  Hip incision is dressed appropriately tender. Chronic vascular changes in the legs.  Psychiatric: He has a normal mood and affect. His behavior is normal.    Results for orders placed or performed during the hospital encounter of 09/23/14 (from the past 24 hour(s))  Glucose, capillary     Status: Abnormal   Collection Time: 09/26/14  6:43 AM  Result Value Ref Range   Glucose-Capillary 162 (H) 70 - 99 mg/dL  Heparin level (unfractionated)     Status: None   Collection Time: 09/26/14 11:05 AM  Result Value Ref Range   Heparin Unfractionated 0.37 0.30 - 0.70 IU/mL   No results found.  Assessment/Plan: Diagnosis: right intertrochanteric fracture after fall 1. Does the need for close, 24 hr/day medical supervision in concert with the patient's rehab needs make it unreasonable for this patient to be served in a less intensive setting? Yes 2. Co-Morbidities requiring supervision/potential complications: htn, afib, chf, prior cva, 3. Due to bladder management, bowel management, safety, skin/wound care, disease management, medication administration, pain management and patient education, does the patient require 24 hr/day rehab nursing? Yes 4. Does the patient require coordinated care of a physician, rehab nurse, PT (1-2 hrs/day, 5 days/week) and OT (1-2 hrs/day, 5 days/week) to address physical and functional deficits in the  context of the above medical diagnosis(es)? Yes Addressing deficits in the following areas: balance, endurance, locomotion, strength, transferring, bowel/bladder control, bathing, dressing, feeding, grooming, toileting and psychosocial support 5. Can the patient actively participate in an intensive  therapy program of at least 3 hrs of therapy per day at least 5 days per week? Yes 6. The potential for patient to make measurable gains while on inpatient rehab is excellent 7. Anticipated functional outcomes upon discharge from inpatient rehab are modified independent  with PT, modified independent and supervision with OT, n/a with SLP. 8. Estimated rehab length of stay to reach the above functional goals is: 7-10 days 9. Does the patient have adequate social supports and living environment to accommodate these discharge functional goals? Yes 10. Anticipated D/C setting: Home 11. Anticipated post D/C treatments: HH therapy, Outpatient therapy and Home excercise program 12. Overall Rehab/Functional Prognosis: excellent  RECOMMENDATIONS: This patient's condition is appropriate for continued rehabilitative care in the following setting: CIR Patient has agreed to participate in recommended program. Yes Note that insurance prior authorization may be required for reimbursement for recommended care.  Comment: Pt is motivated. Wife is available for supervision. Rehab Admissions Coordinator to follow up.  Thanks,  Meredith Staggers, MD, [facility]     09/27/2014

## 2014-09-27 NOTE — Progress Notes (Signed)
Physical Therapy Treatment Patient Details Name: Aaron Frye MRN: [id number] DOB: Oct 07, 1924 Today's Date: 09/27/2014    History of Present Illness Pt is a 81 y.o. very active male who fell at home and fx femur. s/p Rt IM nail.     PT Comments    Pt ambulated 40 ft this session w/ min guard assist 2/2 pt's poor balance.  Pt w/ slight LOB when turning and pt requires increased time to stabilize after standing from recliner.  Pt required verbal cues to stand upright while standing and ambulating.  Rehab admissions coordinator in room at end of session to announce pt has been accepted to CIR which remains PT d/c recommendation.     Follow Up Recommendations  CIR     Equipment Recommendations  None recommended by PT    Recommendations for Other Services       Precautions / Restrictions Precautions Precautions: Fall Restrictions Weight Bearing Restrictions: Yes RLE Weight Bearing: Weight bearing as tolerated    Mobility  Bed Mobility Overal bed mobility:  (pt found and left in recliner)                Transfers Overall transfer level: Needs assistance Equipment used: Rolling walker (2 wheeled) Transfers: Sit to/from Stand Sit to Stand: Min assist         General transfer comment: min assist for standing upright and max verbal cues to stand upright and proper hand positioning  Ambulation/Gait Ambulation/Gait assistance: Min guard Ambulation Distance (Feet): 40 Feet Assistive device: Rolling walker (2 wheeled) Gait Pattern/deviations: Step-through pattern;Decreased stride length;Decreased stance time - right;Antalgic;Trunk flexed Gait velocity: very decr Gait velocity interpretation: Below normal speed for age/gender General Gait Details: Pt w/ slight loss of balance when turning, verbal cues to turn slowly and multiple verbal cues to stand upright   Stairs            Wheelchair Mobility    Modified Rankin (Stroke Patients Only)       Balance  Overall balance assessment: Needs assistance Sitting-balance support: No upper extremity supported;Feet supported Sitting balance-Leahy Scale: Fair   Postural control: Posterior lean Standing balance support: Bilateral upper extremity supported;During functional activity Standing balance-Leahy Scale: Poor Standing balance comment: Pt requires increased time to stabilize once standing from recliner, pt w/ posterior lean                    Cognition Arousal/Alertness: Awake/alert Behavior During Therapy: Flat affect Overall Cognitive Status: Impaired/Different from baseline Area of Impairment: Safety/judgement;Following commands;Problem solving       Following Commands: Follows one step commands with increased time Safety/Judgement: Decreased awareness of safety   Problem Solving: Slow processing;Difficulty sequencing;Requires verbal cues;Requires tactile cues General Comments: apparent [facility]    Exercises Total Joint Exercises Ankle Circles/Pumps: AROM;Both;10 reps;Seated Straight Leg Raises: AAROM;5 reps;Seated;Right Long Arc Quad: AROM;Both;5 reps;Seated    General Comments General comments (skin integrity, edema, etc.): Rehab admissions coordinator in room at end of session to announce pt has been accepted to CIR      Pertinent Vitals/Pain Pain Assessment: 0-10 Pain Score: 3  Pain Location: R hip w/ movement Pain Descriptors / Indicators: Aching;Discomfort;Grimacing;Guarding Pain Intervention(s): Limited activity within patient's tolerance;Monitored during session;Repositioned    Home Living     Available Help at Discharge: Family;Available 24 hours/day;Other (Comment) (wife 68 yo and very active as well)   Home Access: Ramped entrance;Other (comment) (ramp there for his son who has health issues. 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    PT Goals (current goals can now be found in the care plan section) Acute Rehab PT Goals Patient Stated Goal: to go to CIR PT Goal Formulation: With patient/family Time For Goal Achievement: 10/02/14 Potential to Achieve Goals: Good Progress towards PT goals: Progressing toward goals    Frequency  Min 4X/week    PT Plan Current plan remains appropriate    Co-evaluation             End of Session Equipment Utilized During Treatment: Gait belt Activity Tolerance: Patient limited by fatigue;Patient tolerated treatment well Patient left: in chair;with call bell/phone within reach;with family/visitor present (w/ rehab admissions coordinator in room)     Time: 9450-3888 PT Time Calculation (min) (ACUTE ONLY): 19 min  Charges:  $Gait Training: 8-22 mins                    G CodesJoslyn Hy PT, [place] [phone] 09/27/2014, 2:41 PM

## 2014-09-27 NOTE — Discharge Summary (Signed)
[facility]  Physician Discharge Summary   Patient ID: Aaron Frye MRN: [id number] DOB/AGE: 1924-11-25 81 y.o.  Admit date: 09/23/2014 Discharge date: 09/27/2014  PCP: Stephens Shire, MD  DISCHARGE DIAGNOSES:  Principal Problem:   Intertrochanteric fracture of right hip Active Problems:   Essential hypertension   Atrial fibrillation   Congestive heart failure   Long term current use of anticoagulant therapy   History of stroke   HLD (hyperlipidemia)   Leukocytosis   Intertrochanteric fracture of right femur   RECOMMENDATIONS FOR OUTPATIENT FOLLOW UP: 1. Please continue IV heparin until INR is therapeutic between 2 and 3. 2. Please monitor hemoglobin closely 3. Stop date on Vantin will be March 31   DISCHARGE CONDITION: fair  Diet recommendation: Heart healthy  Filed Weights   09/25/14 0450 09/26/14 0603 09/27/14 0452  Weight: 83.961 kg (185 lb 1.6 oz) 82.827 kg (182 lb 9.6 oz) 84.959 kg (187 lb 4.8 oz)    INITIAL HISTORY: 82 year old Caucasian male with a past medical history of atrial fibrillation on Coumadin, TIA, stroke, hypertension, history of prostate cancer, history of systolic congestive heart failure with EF of about 35-40%, presented after a mechanical fall resulting in fracture of his right hip. He was admitted for further management. Patient underwent hip surgery on March 25.  Consultants: Orthopedics  Procedures:  Open reduction internal fixation of the right hip fracture 3/25   HOSPITAL COURSE:   Right hip fracture Patient is status post ORIF. He seems to be doing well postoperatively. Pain control as needed. Initially, home health was recommended, but then yesterday this assessment changed. It was felt that patient may benefit from inpatient rehabilitation. Rehabilitation physician has been consulted. They have evaluated the patient and think he is a good candidate for inpatient rehabilitation. He'll be discharged there today. They can  manage IV heparin.  UTI with Escherichia coli Patient noted to have abnormal UA. Urine cultures grew Escherichia coli. Penicillin allergy was discussed with the patient. He denies any history of anaphylaxis. Patient was initially started on ciprofloxacin. Ciprofloxacin can interact significantly with warfarin. Patient was switched over to [facility] which he seems to be tolerating well. WBC, however, was noted to be higher. Could be due to stress demargination. It is better today. He remains afebrile.   Normocytic Anemia Hemoglobin continues to be lower. Most likely a combination of operative loss and dilution. He also had a brief episode of hematuria. Otherwise, no other overt bleeding has been noted. Continue to monitor at rehabilitation. Transfuse as needed.  Chronic Systolic congestive heart failure 2-D echo on 11/27/2007 showed EF 35-40%. He is compensated on admission. Monitor volume status closely. He was not on Lasix at home. He was taking hydrochlorothiazide. This can be resumed in the next few days if blood pressure is stable with close monitoring of his sodium levels.  Essential Hypertension Blood pressure borderline low at times, but stable. Continue current medications. Hydrochlorothiazide can be resumed in the near future if blood pressure stabilizes.  Chronic Atrial Fibrillation Rate is well controlled. Continue Metoprolol. Patient was on warfarin, which was held for surgery. He was actually given vitamin K to reverse his INR. Warfarin was resumed subsequently. Due to his high risk for thromboembolism he is on bridging with heparin. CHA2DS2-VASc Score is 6. IV heparin to continue until INR is between 2 and 3.  History of stroke Stable. Continue Lipitor and aspirin  Hyperlipidemia Continue Lipitor.   Hyponatremia Levels improved. Continue to monitor periodically.  Patient remained stable. Okay  for discharge to rehabilitation.  PERTINENT LABS:  The results of significant  diagnostics from this hospitalization (including imaging, microbiology, ancillary and laboratory) are listed below for reference.    Microbiology: Recent Results (from the past 240 hour(s))  Urine culture     Status: None   Collection Time: 25-Sep-2014  8:22 PM  Result Value Ref Range Status   Specimen Description URINE, RANDOM  Final   Special Requests NONE  Final   Colony Count   Final    >=100,000 COLONIES/ML Performed at [facility]    Culture   Final    ESCHERICHIA COLI Performed at [facility]    Report Status 09/25/2014 FINAL  Final   Organism ID, Bacteria ESCHERICHIA COLI  Final      Susceptibility   Escherichia coli - MIC*    AMPICILLIN 4 SENSITIVE Sensitive     CEFAZOLIN <=4 SENSITIVE Sensitive     CEFTRIAXONE <=1 SENSITIVE Sensitive     CIPROFLOXACIN <=0.25 SENSITIVE Sensitive     GENTAMICIN <=1 SENSITIVE Sensitive     LEVOFLOXACIN <=0.12 SENSITIVE Sensitive     NITROFURANTOIN <=16 SENSITIVE Sensitive     TOBRAMYCIN <=1 SENSITIVE Sensitive     TRIMETH/SULFA <=20 SENSITIVE Sensitive     PIP/TAZO <=4 SENSITIVE Sensitive     * ESCHERICHIA COLI  Surgical pcr screen     Status: None   Collection Time: 09/24/14  2:00 AM  Result Value Ref Range Status   MRSA, PCR NEGATIVE NEGATIVE Final   Staphylococcus aureus NEGATIVE NEGATIVE Final    Comment:        The Xpert SA Assay (FDA approved for NASAL specimens in patients over 50 years of age), is one component of a comprehensive surveillance program.  Test performance has been validated by [facility] for patients greater than or equal to 91 year old. It is not intended to diagnose infection nor to guide or monitor treatment.      Labs: Basic Metabolic Panel:  Recent Labs Lab 09-25-2014 1930 09/24/14 0507 09/25/14 0542 09/26/14 0425 09/27/14 0550  NA 137 137 136 131* 133*  K 4.1 3.9 4.1 3.8 3.6  CL 100 105 102 99 98  CO2 26 26 28 28 27   GLUCOSE 159* 169* 175* 161* 152*  BUN 20 18 19  26*  26*  CREATININE 0.79 0.78 0.89 0.95 0.82  CALCIUM 9.3 8.7 8.3* 8.3* 7.9*   CBC:  Recent Labs Lab 09-25-14 1930 09/24/14 0507 09/25/14 0542 09/26/14 0425 09/27/14 0550  WBC 15.6* 14.5* 15.8* 16.7* 15.2*  NEUTROABS 13.1*  --   --   --   --   HGB 13.4 11.8* 10.3* 9.7* 8.7*  HCT 42.4 36.9* 32.6* 30.2* 26.9*  MCV 79.0 79.0 79.9 78.4 78.2  PLT 186 184 156 147* 155   CBG:  Recent Labs Lab 09/24/14 0635 09/25/14 0636 09/26/14 0643 09/27/14 0654  GLUCAP 149* 169* 162* 157*     IMAGING STUDIES Dg Knee 1-2 Views Right  09/25/2014   CLINICAL DATA:  Right hip pain secondary to a fall from bed today.  EXAM: RIGHT KNEE - 1-2 VIEW  COMPARISON:  None.  FINDINGS: There is medial joint space narrowing. Tiny osteophytes on the patella. No joint effusion. No fracture.  IMPRESSION: No acute abnormality of the knee.   Electronically Signed   By: Lorriane Shire M.D.   On: 09-25-14 20:53   Ct Head Wo Contrast  25-Sep-2014   CLINICAL DATA:  Slipped and fell onto right side  while sitting on edge of bed. Concern for head injury. Initial encounter.  EXAM: CT HEAD WITHOUT CONTRAST  TECHNIQUE: Contiguous axial images were obtained from the base of the skull through the vertex without intravenous contrast.  COMPARISON:  CT of the head performed 11/25/2007, and MRI of the brain performed 11/26/2007  FINDINGS: There is no evidence of acute infarction, mass lesion, or intra- or extra-axial hemorrhage on CT.  Prominence of the ventricles and sulci reflects moderate cortical volume loss. Cerebellar atrophy is noted. Mild periventricular and subcortical white matter change likely reflects small vessel ischemic microangiopathy.  The brainstem and fourth ventricle are within normal limits. The basal ganglia are unremarkable in appearance. The cerebral hemispheres demonstrate grossly normal gray-white differentiation. No mass effect or midline shift is seen.  There is no evidence of fracture; a periapical abscess is  noted the root of the right first maxillary molar. The orbits are within normal limits. The paranasal sinuses and mastoid air cells are well-aerated. No significant soft tissue abnormalities are seen.  IMPRESSION: 1. No acute intracranial pathology seen on CT. 2. Moderate cortical volume loss and scattered small vessel ischemic microangiopathy. 3. Periapical abscess at the root of the right first maxillary molar.   Electronically Signed   By: Garald Balding M.D.   On: 09/23/2014 20:42   Pelvis Portable  09/24/2014   CLINICAL DATA:  Status post right femoral intertrochanteric nail  EXAM: PORTABLE PELVIS 1-2 VIEWS  COMPARISON:  Right hip radiograph 09/23/2014  FINDINGS: Interval operative reduction of the intertrochanteric right femur fracture, with placement of a partially visualized femoral nail and dynamic hip screw.  No new osseous abnormality detected. Pelvic clips presumably from prostatectomy.  IMPRESSION: No unexpected findings after intertrochanteric right femur fracture ORIF.   Electronically Signed   By: Monte Fantasia M.D.   On: 09/24/2014 15:49   Dg C-arm 1-60 Min  09/24/2014   CLINICAL DATA:  Placement of an chew medullary rod and compression screw for an intertrochanteric right femoral fracture.  EXAM: DG C-ARM 61-120 MIN; RIGHT FEMUR 2 VIEWS  FLUOROSCOPY TIME:  Fluoroscopy Time (in minutes and seconds): 2 minutes, 38 seconds  Number of Acquired Images:  6  COMPARISON:  Preoperative studies of the right hip of September 23, 2014  FINDINGS: Fluoro spot films obtained prior to and following placement of an intra medullary rod and telescoping screw are reviewed. Radiographic positioning of the rod and screw is good. Alignment of the fracture is near anatomic.  IMPRESSION: There is no immediate postprocedure complication following ORIF for an intertrochanteric fracture of the right hip.   Electronically Signed   By: David  Martinique   On: 09/24/2014 14:23   Dg Hip Unilat With Pelvis 2-3 Views  Right  09/23/2014   CLINICAL DATA:  Fall from bed with right hip pain  EXAM: RIGHT HIP (WITH PELVIS) 2-3 VIEWS  COMPARISON:  None.  FINDINGS: There is a comminuted intratrochanteric right proximal femoral fracture with impaction and angulation at the fracture site. Pelvic ring is intact. No other focal abnormality is seen.  IMPRESSION: Intratrochanteric right femoral fracture.   Electronically Signed   By: Inez Catalina M.D.   On: 09/23/2014 20:51   Dg Femur, Min 2 Views Right  09/24/2014   CLINICAL DATA:  ORIF.  EXAM: RIGHT FEMUR 2 VIEWS  COMPARISON:  09/24/2014.  FINDINGS: Patient status post open reduction internal fixation right intertrochanteric hip fracture. Good anatomic alignment. Peripheral vascular calcification .  IMPRESSION: ORIF right intertrochanteric hip fracture with  good anatomic alignment. Hardware intact .   Electronically Signed   By: Marcello Moores  Register   On: 09/24/2014 15:47   Dg Femur, Min 2 Views Right  09/24/2014   CLINICAL DATA:  Placement of an chew medullary rod and compression screw for an intertrochanteric right femoral fracture.  EXAM: DG C-ARM 61-120 MIN; RIGHT FEMUR 2 VIEWS  FLUOROSCOPY TIME:  Fluoroscopy Time (in minutes and seconds): 2 minutes, 38 seconds  Number of Acquired Images:  6  COMPARISON:  Preoperative studies of the right hip of September 23, 2014  FINDINGS: Fluoro spot films obtained prior to and following placement of an intra medullary rod and telescoping screw are reviewed. Radiographic positioning of the rod and screw is good. Alignment of the fracture is near anatomic.  IMPRESSION: There is no immediate postprocedure complication following ORIF for an intertrochanteric fracture of the right hip.   Electronically Signed   By: David  Martinique   On: 09/24/2014 14:23   Dg Femur, Min 2 Views Right  09/23/2014   CLINICAL DATA:  Hip pain secondary to a fall off of his bed today.  EXAM: RIGHT FEMUR 2 VIEWS  COMPARISON:  None.  FINDINGS: There is a comminuted  intertrochanteric fracture of the proximal right femur. The distal femur is intact. There is osteoarthritis of the knee with medial joint space narrowing.  IMPRESSION: Intertrochanteric fracture of the proximal right femur with slight angulation and displacement.   Electronically Signed   By: Lorriane Shire M.D.   On: 09/23/2014 20:52    DISCHARGE EXAMINATION: See progress note from earlier today.  DISPOSITION: [facility]  ALLERGIES:  Allergies  Allergen Reactions  . Penicillins     REACTION: unspecified    Current Inpatient Medications:  Scheduled: . aspirin  81 mg Oral Daily  . atorvastatin  40 mg Oral Daily  . cefpodoxime  200 mg Oral Q12H  . docusate sodium  100 mg Oral BID  . metoprolol succinate  50 mg Oral Q breakfast  . ramipril  5 mg Oral Daily  . saccharomyces boulardii  250 mg Oral BID  . senna  2 tablet Oral QHS  . sodium chloride  3 mL Intravenous Q12H  . warfarin  7.5 mg Oral ONCE-1800  . Warfarin - Pharmacist Dosing Inpatient   Does not apply q1800   Continuous: . heparin 1,300 Units/hr (09/27/14 1228)   YOV:ZCHYIFOYDXAJO **OR** acetaminophen, alum & mag hydroxide-simeth, HYDROcodone-acetaminophen, menthol-cetylpyridinium **OR** phenol, metoCLOPramide **OR** metoCLOPramide (REGLAN) injection, morphine injection, ondansetron **OR** ondansetron (ZOFRAN) IV, ondansetron **OR** ondansetron (ZOFRAN) IV  Discharge medications to be determined by rehabilitation providers when he is ready for discharge.   Follow-up Information    Follow up with BURNETT,BRENT A, MD. Schedule an appointment as soon as possible for a visit in 2 weeks.   Specialty:  Family Medicine   Why:  post hospitalization follow up   Contact information:   [address] [place] [address] [phone]       Follow up with Swinteck, Horald Pollen, MD. Schedule an appointment as soon as possible for a visit in 2 weeks.   Specialty:  Orthopedic Surgery    Why:  post hospitalization follow up   Contact information:   [address] [place] [address] [phone]       TOTAL DISCHARGE TIME: 35 mins  [facility] Pager [phone]  09/27/2014, 2:10 PM

## 2014-09-27 NOTE — Care Management Note (Signed)
CARE MANAGEMENT NOTE 09/27/2014  Patient:  Aaron Frye, Aaron Frye   Account Number:  [id number]  Date Initiated:  09/27/2014  Documentation initiated by:  Ricki Miller  Subjective/Objective Assessment:   81 yr old male admitted with right hip fracture s/p fall. Patient underwent a right hip IM Nailing.     Action/Plan:   Patient will need shortterm rehab. Being evaluated for inpatient rehab, social worker is aware and has SNF as backup plan.   Anticipated DC Date:  09/28/2014   Anticipated DC Plan:  IP REHAB FACILITY  In-house referral  Clinical Social Worker      DC Planning Services  CM consult      Choice offered to / List presented to:             Status of service:  In process, will continue to follow Medicare Important Message given?   (If response is "NO", the following Medicare IM given date fields will be blank) Date Medicare IM given:   Medicare IM given by:   Date Additional Medicare IM given:   Additional Medicare IM given by:    Discharge Disposition:    Per UR Regulation:    If discussed at Long Length of Stay Meetings, dates discussed:    Comments:

## 2014-09-27 NOTE — Clinical Social Work Psychosocial (Signed)
Clinical Social Work Department BRIEF PSYCHOSOCIAL ASSESSMENT 09/27/2014  Patient:  Aaron Frye, Aaron Frye     Account Number:  [id number]     Admit date:  09/23/2014  Clinical Social Worker:  Wylene Men  Date/Time:  09/27/2014 11:24 AM  Referred by:  Physician  Date Referred:  09/27/2014 Referred for  SNF Placement  Psychosocial assessment   Other Referral:   none   Interview type:  Other - See comment Other interview type:   patient and patient's wife, Aaron Frye    PSYCHOSOCIAL DATA Living Status:  WIFE Admitted from facility:   Level of care:   Primary support name:  Aaron Frye Primary support relationship to patient:  SPOUSE Degree of support available:   adequate    CURRENT CONCERNS Current Concerns  Post-Acute Placement   Other Concerns:   none    SOCIAL WORK ASSESSMENT / PLAN CSW assessed patient at bedside.  Patient was alert and oriented x4 sitting in the bedside chair.  Patient wife, Aaron Frye at bedside.  Patient requested wife remain in the room during this assessment.  Patient is eager to return home.  PT has recommended CIR/SNF at time of discharge. Patient and wife are trusting that CIR will be able to admit patient today, if not, they are willing to receive STR at [facility] SNF.  Patient and wife have been married for 2 years (celebrated anniversary last week) and do not like to be a part from each other.  Patient is retired and wife remains in the work force though part time.  Patient and wife and very supportive of each other and realize that patient would not be safe going home prior to STR.  CSW followed up with CIR who MD has evaluated and recommended for admission.  Coordinator to evaluate and contact CSW with update.   Assessment/plan status:  Psychosocial Support/Ongoing Assessment of Needs Other assessment/ plan:   FL2  PASARR   Information/referral to community resources:   SNF/STR  CIR    PATIENT'S/FAMILY'S RESPONSE TO PLAN OF CARE: Patient  and wife are agreeable to STR at [facility] and SNF if CIR has been denied. Patient and wife are requesting [facility] if CIR denied.       Nonnie Done, [place] [phone]  Psychiatric & Orthopedics (5N 1-8) Clinical Social Worker

## 2014-09-27 NOTE — Clinical Social Work Placement (Signed)
Clinical Social Work Department CLINICAL SOCIAL WORK PLACEMENT NOTE 09/27/2014  Patient:  Aaron Frye, Aaron Frye  Account Number:  [id number] Admit date:  09/23/2014  Clinical Social Worker:  Wylene Men  Date/time:  09/27/2014 11:30 AM  Clinical Social Work is seeking post-discharge placement for this patient at the following level of care:   [place]   (*CSW will update this form in Epic as items are completed)   09/27/2014  Patient/family provided with [facility] Department of Clinical Social Work's list of facilities offering this level of care within the geographic area requested by the patient (or if unable, by the patient's family).  09/27/2014  Patient/family informed of their freedom to choose among providers that offer the needed level of care, that participate in Medicare, Medicaid or managed care program needed by the patient, have an available bed and are willing to accept the patient.  09/27/2014  Patient/family informed of [facility]' ownership interest in [facility], as well as of the fact that they are under no obligation to receive care at this facility.  PASARR submitted to EDS on 09/27/2014 PASARR number received on 09/27/2014  FL2 transmitted to all facilities in geographic area requested by pt/family on  09/27/2014 FL2 transmitted to all facilities within larger geographic area on   Patient informed that his/her managed care company has contracts with or will negotiate with  certain facilities, including the following:     Patient/family informed of bed offers received:   Patient chooses bed at [facility] Physician recommends and patient chooses bed at    Patient to be transferred to  on   Patient to be transferred to facility by  Patient and family notified of transfer on  Name of family member notified:    The following physician request were entered in Epic:   Additional Comments: patient has been  recommended for CIR.  [facility] is the patient's/family's choice if CIR declines admission.
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
## 2014-09-27 NOTE — Progress Notes (Signed)
Report given to Clifford on [facility].

## 2014-09-27 NOTE — Interval H&P Note (Signed)
Aaron Frye was admitted today to Inpatient Rehabilitation with the diagnosis of intertrochanteric right hip fracture.  The patient's history has been reviewed, patient examined, and there is no change in status.  Patient continues to be appropriate for intensive inpatient rehabilitation.  I have reviewed the patient's chart and labs.  Questions were answered to the patient's satisfaction.  SWARTZ,ZACHARY T 09/27/2014, 8:26 PM

## 2014-09-27 NOTE — H&P (Signed)
Physical Medicine and Rehabilitation Admission H&P    Chief Complaint  Patient presents with  . Fall  . Hip Pain  : HPI: Aaron Frye is a 81 y.o. right handed male with history of systolic congestive heart failure, hypertension, CVA, atrial fibrillation with chronic Coumadin. Independent prior to admission living with his wife. Presented 09/23/2014 after a fall landing on his right side. Denied any loss of consciousness. Cranial CT scan negative. INR on admission of 2.20. Coumadin was reversed and patient underwent IM nailing 09/24/2014 of right intertrochanteric femur fracture per Dr.Swinteck. Weightbearing as tolerated right lower extremity. Hospital course pain management. Chronic Coumadin has been resumed with heparin for bridging. Acute blood loss anemia 8.7 and monitored. Escherichia coli urinary tract infection maintained on Vantin 200 mg twice a day initiated 09/25/2014. Physical and occupational therapy evaluations completed with recommendations of physical medicine rehabilitation consult. Patient was admitted for comprehensive rehabilitation program  ROS Review of Systems  Cardiovascular: Positive for palpitations.  Gastrointestinal: Positive for constipation.  Musculoskeletal: Positive for myalgias.  All other systems reviewed and are negative   Past Medical History  Diagnosis Date  . TIA (transient ischemic attack)   . HLD (hyperlipidemia)   . HTN (hypertension)   . CVA (cerebral vascular accident)   . Atrial fibrillation   . Prostate cancer    Past Surgical History  Procedure Laterality Date  . Appendectomy    . Tonsillectomy     Family History  Problem Relation Age of Onset  . Heart failure Mother   . Lung disease Father   . Cancer Brother     Not sure which type of cancer   Social History:  reports that he has never smoked. He does not have any smokeless tobacco history on file. He reports that he does not drink alcohol or use illicit drugs. Allergies:   Allergies  Allergen Reactions  . Penicillins     REACTION: unspecified   Medications Prior to Admission  Medication Sig Dispense Refill  . aspirin 81 MG tablet Take 81 mg by mouth daily.      [name] atorvastatin (LIPITOR) 40 MG tablet TAKE ONE TABLET BY MOUTH ONCE DAILY 90 tablet 0  . hydrochlorothiazide (MICROZIDE) 12.5 MG capsule TAKE ONE CAPSULE BY MOUTH ONCE DAILY IN THE MORNING 90 capsule 1  . metoprolol succinate (TOPROL-XL) 100 MG 24 hr tablet Take 50 mg by mouth daily. Take with or immediately following a meal.    . ramipril (ALTACE) 5 MG capsule TAKE ONE CAPSULE BY MOUTH ONCE DAILY 30 capsule 5  . warfarin (COUMADIN) 5 MG tablet Take 2.5-5 mg by mouth See admin instructions. Take 74m every day except on Tuesday take 2.5100mper patient    . metoprolol succinate (TOPROL-XL) 100 MG 24 hr tablet TAKE ONE-HALF TABLET BY MOUTH ONCE DAILY (Patient not taking: Reported on 09/23/2014) 45 tablet 0  . warfarin (COUMADIN) 5 MG tablet Take as directed by coumadin clinic (Patient not taking: Reported on 09/23/2014) 100 tablet 0    Home: Home Living Family/patient expects to be discharged to:: Private residence Living Arrangements: Spouse/significant other, Children Available Help at Discharge: Family, Available 24 hours/day Type of Home: House Home Access: Ramped entrance Home Layout: One level Home Equipment: WaEnvironmental consultant 2 wheels, Shower seat   Functional History: Prior Function Level of Independence: Independent  Functional Status:  Mobility: Bed Mobility Overal bed mobility: Needs Assistance Bed Mobility: Supine to Sit Supine to sit: Mod assist, HOB elevated General bed mobility  comments: pt cont with incr difficulty with bed mobility; (A) to bring Rt LE off EOB and to elevate trunk; incr time and max cues Transfers Overall transfer level: Needs assistance Equipment used: Rolling walker (2 wheeled) Transfers: Sit to/from Stand Sit to Stand: Max assist Stand pivot transfers: Min  assist, From elevated surface General transfer comment: pt requiring max (A) to stand today due to fatigue and pain; max cueing for sequencing and use of anterior rock technique to shift weight onto feet Ambulation/Gait Ambulation/Gait assistance: Mod assist Ambulation Distance (Feet): 20 Feet Assistive device: Rolling walker (2 wheeled) Gait Pattern/deviations: Step-to pattern, Decreased stance time - right, Decreased step length - left, Shuffle, Trunk flexed, Narrow base of support Gait velocity: very decr Gait velocity interpretation: Below normal speed for age/gender General Gait Details: pt requiring mod (A) to balance with incr mobility; pt with multiple bouts of LOB posteriorly; max cueing for safety and use of RW; pt c/o dizziness with directional changes    ADL:    Cognition: Cognition Overall Cognitive Status: Impaired/Different from baseline Orientation Level: Oriented X4 Cognition Arousal/Alertness: Awake/alert Behavior During Therapy: Flat affect Overall Cognitive Status: Impaired/Different from baseline Area of Impairment: Problem solving, Following commands Current Attention Level: Sustained Following Commands: Follows one step commands with increased time Problem Solving: Slow processing, Difficulty sequencing, Requires verbal cues, Requires tactile cues General Comments: could be attributed to [facility], incr time for commands  Physical Exam: Blood pressure 111/48, pulse 103, temperature 98.3 F (36.8 C), temperature source Oral, resp. rate 16, height _0  (1.702 m), weight 84.959 kg (187 lb 4.8 oz), SpO2 97 %. Physical Exam Constitutional: He is oriented to person, place, and time. He appears well-developed.  HENT:  Head: Normocephalic and atraumatic.  Eyes: Conjunctivae and EOM are normal. Pupils are equal, round, and reactive to light.  Neck: Normal range of motion. Neck supple. Tracheal deviation present. No thyromegaly present.  Cardiovascular: Normal rate and  regular rhythm.  No murmur heard. Cardiac rate controlled  Respiratory: Effort normal and breath sounds normal. No respiratory distress. He has no wheezes. He has no rales.  GI: Soft. Bowel sounds are normal. He exhibits no distension.  Musculoskeletal: He exhibits edema.  Neurological: He is alert and oriented to person, place, and time.  Right leg limited by pain/ortho issues. UE 5/5. LLE 4/5 hf,ke 5/5 ankle. No sensory deficits  Skin:  Hip incisions are dressed and limb is appropriately tender. Chronic vascular changes in the distal legs.  Psychiatric: He has a normal mood and affect. His behavior is normal   Results for orders placed or performed during the hospital encounter of 09/23/14 (from the past 48 hour(s))  Heparin level (unfractionated)     Status: Abnormal   Collection Time: 09/25/14  6:30 PM  Result Value Ref Range   Heparin Unfractionated 0.73 (H) 0.30 - 0.70 IU/mL    Comment:        IF HEPARIN RESULTS ARE BELOW EXPECTED VALUES, AND PATIENT DOSAGE HAS BEEN CONFIRMED, SUGGEST FOLLOW UP TESTING OF ANTITHROMBIN III LEVELS.   CBC     Status: Abnormal   Collection Time: 09/26/14  4:25 AM  Result Value Ref Range   WBC 16.7 (H) 4.0 - 10.5 K/uL   RBC 3.85 (L) 4.22 - 5.81 MIL/uL   Hemoglobin 9.7 (L) 13.0 - 17.0 g/dL   HCT 30.2 (L) 39.0 - 52.0 %   MCV 78.4 78.0 - 100.0 fL   MCH 25.2 (L) 26.0 - 34.0 pg   MCHC  32.1 30.0 - 36.0 g/dL   RDW 16.3 (H) 11.5 - 15.5 %   Platelets 147 (L) 150 - 400 K/uL  Basic metabolic panel     Status: Abnormal   Collection Time: 09/26/14  4:25 AM  Result Value Ref Range   Sodium 131 (L) 135 - 145 mmol/L   Potassium 3.8 3.5 - 5.1 mmol/L   Chloride 99 96 - 112 mmol/L   CO2 28 19 - 32 mmol/L   Glucose, Bld 161 (H) 70 - 99 mg/dL   BUN 26 (H) 6 - 23 mg/dL   Creatinine, Ser 0.95 0.50 - 1.35 mg/dL   Calcium 8.3 (L) 8.4 - 10.5 mg/dL   GFR calc non Af Amer 73 (L) >90 mL/min   GFR calc Af Amer 84 (L) >90 mL/min    Comment: (NOTE) The eGFR has  been calculated using the CKD EPI equation. This calculation has not been validated in all clinical situations. eGFR's persistently <90 mL/min signify possible Chronic Kidney Disease.    Anion gap 4 (L) 5 - 15  Heparin level (unfractionated)     Status: None   Collection Time: 09/26/14  4:40 AM  Result Value Ref Range   Heparin Unfractionated 0.47 0.30 - 0.70 IU/mL    Comment:        IF HEPARIN RESULTS ARE BELOW EXPECTED VALUES, AND PATIENT DOSAGE HAS BEEN CONFIRMED, SUGGEST FOLLOW UP TESTING OF ANTITHROMBIN III LEVELS.   Protime-INR     Status: Abnormal   Collection Time: 09/26/14  4:40 AM  Result Value Ref Range   Prothrombin Time 17.4 (H) 11.6 - 15.2 seconds   INR 1.41 0.00 - 1.49  Glucose, capillary     Status: Abnormal   Collection Time: 09/26/14  6:43 AM  Result Value Ref Range   Glucose-Capillary 162 (H) 70 - 99 mg/dL  Heparin level (unfractionated)     Status: None   Collection Time: 09/26/14 11:05 AM  Result Value Ref Range   Heparin Unfractionated 0.37 0.30 - 0.70 IU/mL    Comment:        IF HEPARIN RESULTS ARE BELOW EXPECTED VALUES, AND PATIENT DOSAGE HAS BEEN CONFIRMED, SUGGEST FOLLOW UP TESTING OF ANTITHROMBIN III LEVELS.   CBC     Status: Abnormal   Collection Time: 09/27/14  5:50 AM  Result Value Ref Range   WBC 15.2 (H) 4.0 - 10.5 K/uL   RBC 3.44 (L) 4.22 - 5.81 MIL/uL   Hemoglobin 8.7 (L) 13.0 - 17.0 g/dL   HCT 26.9 (L) 39.0 - 52.0 %   MCV 78.2 78.0 - 100.0 fL   MCH 25.3 (L) 26.0 - 34.0 pg   MCHC 32.3 30.0 - 36.0 g/dL   RDW 16.0 (H) 11.5 - 15.5 %   Platelets 155 150 - 400 K/uL  Protime-INR     Status: Abnormal   Collection Time: 09/27/14  5:50 AM  Result Value Ref Range   Prothrombin Time 16.7 (H) 11.6 - 15.2 seconds   INR 1.34 0.00 - 1.49  Heparin level (unfractionated)     Status: Abnormal   Collection Time: 09/27/14  5:50 AM  Result Value Ref Range   Heparin Unfractionated 0.17 (L) 0.30 - 0.70 IU/mL    Comment:        IF HEPARIN RESULTS  ARE BELOW EXPECTED VALUES, AND PATIENT DOSAGE HAS BEEN CONFIRMED, SUGGEST FOLLOW UP TESTING OF ANTITHROMBIN III LEVELS.   Basic metabolic panel     Status: Abnormal   Collection Time: 09/27/14  5:50 AM  Result Value Ref Range   Sodium 133 (L) 135 - 145 mmol/L   Potassium 3.6 3.5 - 5.1 mmol/L   Chloride 98 96 - 112 mmol/L   CO2 27 19 - 32 mmol/L   Glucose, Bld 152 (H) 70 - 99 mg/dL   BUN 26 (H) 6 - 23 mg/dL   Creatinine, Ser 0.82 0.50 - 1.35 mg/dL   Calcium 7.9 (L) 8.4 - 10.5 mg/dL   GFR calc non Af Amer 77 (L) >90 mL/min   GFR calc Af Amer 90 (L) >90 mL/min    Comment: (NOTE) The eGFR has been calculated using the CKD EPI equation. This calculation has not been validated in all clinical situations. eGFR's persistently <90 mL/min signify possible Chronic Kidney Disease.    Anion gap 8 5 - 15  Reticulocytes     Status: Abnormal   Collection Time: 09/27/14  5:50 AM  Result Value Ref Range   Retic Ct Pct 1.3 0.4 - 3.1 %   RBC. 3.44 (L) 4.22 - 5.81 MIL/uL   Retic Count, Manual 44.7 19.0 - 186.0 K/uL  Glucose, capillary     Status: Abnormal   Collection Time: 09/27/14  6:54 AM  Result Value Ref Range   Glucose-Capillary 157 (H) 70 - 99 mg/dL   No results found.     Medical Problem List and Plan: 1. Functional deficits secondary to right intertrochanteric femur fracture. Status post IM nailing 09/24/2014. Weightbearing as tolerated 2.  DVT Prophylaxis/Anticoagulation: Chronic Coumadin therapy for atrial fibrillation. Intravenous heparin until INR therapeutic. Monitor for any bleeding episodes 3. Pain Management: Hydrocodone. Monitor with increased mobility 4. Acute blood loss anemia. Follow-up CBC 5. Neuropsych: This patient is capable of making decisions on his own behalf. 6. Skin/Wound Care: Routine skin checks 7. Fluids/Electrolytes/Nutrition: Strict I and O's with follow-up chemistries 8. Hypertension. Altace 5 mg daily, Toprol-XL 50 mg daily. Monitor with increased  mobility 9. Escherichia coli UTI. Vantin 200 mg twice a day initiated 09/25/2014 10. Hyperlipidemia. Lipitor   Post Admission Physician Evaluation: 1. Functional deficits secondary  to right intertrochanteric femur fracture/IMN. 2. Patient is admitted to receive collaborative, interdisciplinary care between the physiatrist, rehab nursing staff, and therapy team. 3. Patient's level of medical complexity and substantial therapy needs in context of that medical necessity cannot be provided at a lesser intensity of care such as a SNF. 4. Patient has experienced substantial functional loss from his/her baseline which was documented above under the "Functional History" and "Functional Status" headings.  Judging by the patient's diagnosis, physical exam, and functional history, the patient has potential for functional progress which will result in measurable gains while on inpatient rehab.  These gains will be of substantial and practical use upon discharge  in facilitating mobility and self-care at the household level. 5. Physiatrist will provide 24 hour management of medical needs as well as oversight of the therapy plan/treatment and provide guidance as appropriate regarding the interaction of the two. 6. 24 hour rehab nursing will assist with bladder management, bowel management, safety, skin/wound care, disease management, medication administration, pain management and patient education  and help integrate therapy concepts, techniques,education, etc. 7. PT will assess and treat for/with: Lower extremity strength, range of motion, stamina, balance, functional mobility, safety, adaptive techniques and equipment, pain mgt, ortho precautions, community reintegration.   Goals are: mod I. 8. OT will assess and treat for/with: ADL's, functional mobility, safety, upper extremity strength, adaptive techniques and equipment, ortho precautions, pain mgt, ego support,  community reintegration.   Goals are: mod I.  Therapy may proceed with showering this patient. 9. SLP will assess and treat for/with: n/a.  Goals are: n/a. 10. Case Management and Social Worker will assess and treat for psychological issues and discharge planning. 11. Team conference will be held weekly to assess progress toward goals and to determine barriers to discharge. 12. Patient will receive at least 3 hours of therapy per day at least 5 days per week. 13. ELOS: 10-12 days       14. Prognosis:  excellent     Meredith Staggers, MD, [place] [facility] 09/27/2014   09/27/2014

## 2014-09-28 ENCOUNTER — Inpatient Hospital Stay (HOSPITAL_COMMUNITY): Payer: Medicare Other

## 2014-09-28 ENCOUNTER — Inpatient Hospital Stay (HOSPITAL_COMMUNITY): Payer: Medicare Other | Admitting: Occupational Therapy

## 2014-09-28 ENCOUNTER — Encounter (HOSPITAL_COMMUNITY): Payer: Self-pay | Admitting: Orthopedic Surgery

## 2014-09-28 DIAGNOSIS — I10 Essential (primary) hypertension: Secondary | ICD-10-CM

## 2014-09-28 DIAGNOSIS — I4891 Unspecified atrial fibrillation: Secondary | ICD-10-CM

## 2014-09-28 DIAGNOSIS — I5032 Chronic diastolic (congestive) heart failure: Secondary | ICD-10-CM

## 2014-09-28 DIAGNOSIS — S72141S Displaced intertrochanteric fracture of right femur, sequela: Secondary | ICD-10-CM

## 2014-09-28 LAB — CBC
HCT: 24.3 % — ABNORMAL LOW (ref 39.0–52.0)
Hemoglobin: 7.9 g/dL — ABNORMAL LOW (ref 13.0–17.0)
MCH: 25.2 pg — AB (ref 26.0–34.0)
MCHC: 32.5 g/dL (ref 30.0–36.0)
MCV: 77.6 fL — ABNORMAL LOW (ref 78.0–100.0)
PLATELETS: 176 10*3/uL (ref 150–400)
RBC: 3.13 MIL/uL — ABNORMAL LOW (ref 4.22–5.81)
RDW: 15.9 % — AB (ref 11.5–15.5)
WBC: 11.5 10*3/uL — ABNORMAL HIGH (ref 4.0–10.5)

## 2014-09-28 LAB — COMPREHENSIVE METABOLIC PANEL
ALT: 36 U/L (ref 0–53)
AST: 45 U/L — AB (ref 0–37)
Albumin: 2.1 g/dL — ABNORMAL LOW (ref 3.5–5.2)
Alkaline Phosphatase: 67 U/L (ref 39–117)
Anion gap: 7 (ref 5–15)
BILIRUBIN TOTAL: 1 mg/dL (ref 0.3–1.2)
BUN: 27 mg/dL — AB (ref 6–23)
CHLORIDE: 99 mmol/L (ref 96–112)
CO2: 27 mmol/L (ref 19–32)
CREATININE: 0.83 mg/dL (ref 0.50–1.35)
Calcium: 7.8 mg/dL — ABNORMAL LOW (ref 8.4–10.5)
GFR calc Af Amer: 89 mL/min — ABNORMAL LOW (ref >90)
GFR calc non Af Amer: 77 mL/min — ABNORMAL LOW (ref >90)
GLUCOSE: 135 mg/dL — AB (ref 70–99)
POTASSIUM: 3.7 mmol/L (ref 3.5–5.1)
SODIUM: 133 mmol/L — AB (ref 135–145)
TOTAL PROTEIN: 4.8 g/dL — AB (ref 6.0–8.3)

## 2014-09-28 LAB — HEPARIN LEVEL (UNFRACTIONATED)
Heparin Unfractionated: 0.28 IU/mL — ABNORMAL LOW (ref 0.30–0.70)
Heparin Unfractionated: 0.64 IU/mL (ref 0.30–0.70)
Heparin Unfractionated: 0.68 IU/mL (ref 0.30–0.70)

## 2014-09-28 LAB — PROTIME-INR
INR: 1.38 (ref 0.00–1.49)
PROTHROMBIN TIME: 17.1 s — AB (ref 11.6–15.2)

## 2014-09-28 MED ORDER — FERROUS SULFATE 325 (65 FE) MG PO TABS
325.0000 mg | ORAL_TABLET | Freq: Two times a day (BID) | ORAL | Status: DC
  Administered 2014-09-28 – 2014-10-08 (×21): 325 mg via ORAL
  Filled 2014-09-28 (×25): qty 1

## 2014-09-28 NOTE — Progress Notes (Signed)
Occupational Therapy Assessment and Plan  Patient Details  Name: Aaron Frye MRN: [id number] Date of Birth: 10-12-1924  OT Diagnosis: acute pain and muscle weakness (generalized) Rehab Potential: Rehab Potential (ACUTE ONLY): Good ELOS: 5-7 days   Today's Date: 09/28/2014 OT Individual Time: 0900-1000 OT Individual Time Calculation (min): 60 min     Problem List:  Patient Active Problem List   Diagnosis Date Noted  . Leukocytosis 09/24/2014  . Intertrochanteric fracture of right femur 09/24/2014  . Hip fracture 09/23/2014  . History of stroke 09/23/2014  . HLD (hyperlipidemia) 09/23/2014  . Fall   . Encounter for therapeutic drug monitoring 09/01/2013  . Long term current use of anticoagulant therapy 11/14/2010  . Congestive heart failure 04/18/2010  . EDEMA 10/12/2008  . Elevated lipids 10/08/2008  . Essential hypertension 10/08/2008  . Atrial fibrillation 10/08/2008  . CVA 10/08/2008  . Transient cerebral ischemia 10/08/2008    Past Medical History:  Past Medical History  Diagnosis Date  . TIA (transient ischemic attack)   . HLD (hyperlipidemia)   . HTN (hypertension)   . CVA (cerebral vascular accident)   . Atrial fibrillation   . Prostate cancer    Past Surgical History:  Past Surgical History  Procedure Laterality Date  . Appendectomy    . Tonsillectomy      Assessment & Plan Clinical Impression: Patient is a 81 y.o. year old male with history of systolic congestive heart failure, hypertension, CVA, atrial fibrillation with chronic Coumadin. Independent prior to admission living with his wife. Presented 09/23/2014 after a fall landing on his right side. Denied any loss of consciousness. Cranial CT scan negative. INR on admission of 2.20. Coumadin was reversed and patient underwent IM nailing 09/24/2014 of right intertrochanteric femur fracture per Dr.Swinteck. Weightbearing as tolerated right lower extremity. Hospital course pain management. Chronic  Coumadin has been resumed with heparin for bridging. Acute blood loss anemia 8.7 and monitored. Escherichia coli urinary tract infection maintained on Vantin 200 mg twice a day initiated 09/25/2014. Physical and occupational therapy evaluations completed with recommendations of physical medicine rehabilitation consult. Patient was admitted for comprehensive rehabilitation program .  Patient transferred to CIR on 09/27/2014 .    Patient currently requires mod with basic self-care skills secondary to muscle weakness and acute pain, decreased cardiorespiratoy endurance and decreased standing balance and decreased balance strategies.  Prior to hospitalization, patient could complete ADLs and IADLs with independent .  Patient will benefit from skilled intervention to increase independence with basic self-care skills prior to discharge home with care partner.  Anticipate patient will require intermittent supervision and follow up outpatient.  OT - End of Session Activity Tolerance: Decreased this session Endurance Deficit: Yes Endurance Deficit Description: required multiple rest breaks secondary to fatigue during self care and standing tasks OT Assessment Rehab Potential (ACUTE ONLY): Good Barriers to Discharge:  (none noted at this time) OT Patient demonstrates impairments in the following area(s): Balance;Edema;Endurance;Motor;Pain;Safety OT Basic ADL's Functional Problem(s): Grooming;Bathing;Dressing;Toileting OT Advanced ADL's Functional Problem(s): Light Housekeeping (pt washes dishes at home) OT Transfers Functional Problem(s): Toilet;Tub/Shower OT Additional Impairment(s): None OT Plan OT Intensity: Minimum of 1-2 x/day, 45 to 90 minutes OT Frequency: 5 out of 7 days OT Duration/Estimated Length of Stay: 5-7 days OT Treatment/Interventions: Balance/vestibular training;Discharge planning;Pain management;Self Care/advanced ADL retraining;Therapeutic Activities;UE/LE Coordination  activities;Functional mobility training;Patient/family education;Therapeutic Exercise;Community reintegration;DME/adaptive equipment instruction;UE/LE Strength taining/ROM OT Self Feeding Anticipated Outcome(s): n/a OT Basic Self-Care Anticipated Outcome(s): Mod I - supervision OT Toileting Anticipated Outcome(s): Mod I -  supervision OT Bathroom Transfers Anticipated Outcome(s): Mod I - toilet , supervision - shower OT Recommendation Recommendations for Other Services:  (none at this time) Patient destination: Home Follow Up Recommendations: 24 hour supervision/assistance;Outpatient OT Equipment Recommended: 3 in 1 bedside comode Equipment Details: has shower seat and grab bars in walk in shower   Skilled Therapeutic Intervention Upon entering the room, pt seated in wheelchair with 5/10 c/o pain in R LE. RN notified. OT educated pt on OT purpose, POC, and goals with pt verbalizing understanding and asking questions as appropriate. Pt engaged in STS at sink from wheelchair for grooming tasks. Pt required min verbal cues for proper technique for STS from wheelchair. Pt standing 7 minutes and 6 minutes respectively at sink with Min A for dynamic standing balance when combing hair, brushing teeth, and shaving face. Pt needing to sit down in order to perform clean electric shaver from fatigue. LB dressing/undressing from wheelchair at sink. Pt unable to reach R foot to donn and doff socks secondary to increased pain. Pt ambulating ~ 5 feet to sit in recliner chair with min A and min verbal cues for proper technique. Pt reclined with call bell and all other needed items within reach upon exiting the room.   OT Evaluation Precautions/Restrictions  Precautions Precautions: Fall Restrictions Weight Bearing Restrictions: Yes RLE Weight Bearing: Weight bearing as tolerated Vital Signs Therapy Vitals Pulse Rate: 97 BP: (!) 142/50 mmHg Patient Position (if appropriate): Lying Oxygen Therapy SpO2: 96  % O2 Device: Not Delivered Pain Pain Assessment Pain Assessment: 0-10 Pain Score: 5  Pain Type: Acute pain Pain Location: Leg Pain Orientation: Right Pain Descriptors / Indicators: Aching Pain Onset: Gradual Patients Stated Pain Goal: 1 Pain Intervention(s): RN made aware;Repositioned Multiple Pain Sites: No Home Living/Prior Functioning Home Living Family/patient expects to be discharged to:: Private residence Living Arrangements: Spouse/significant other Available Help at Discharge: Family, Available 24 hours/day, Other (Comment) Type of Home: House Home Access: Ramped entrance, Other (comment) Home Layout: One level Additional Comments: has shower chair and grab bar  Lives With: Spouse Prior Function Level of Independence: Independent with basic ADLs  Able to Take Stairs?: Yes Driving: Yes Vocation: Full time employment Comments: 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 Discussed current news events with therapist without difficulty. Sensation Sensation Light Touch: Appears Intact Stereognosis: Not tested Hot/Cold: Appears Intact Proprioception: Appears Intact Coordination Gross Motor Movements are Fluid and Coordinated: No Fine Motor Movements are Fluid and Coordinated: Yes Coordination and Movement Description: decreased coordination in R LE secondary  Motor  Motor Motor: Within Functional Limits Motor -  Skilled Clinical Observations: generalized weakness and endurance, decreased balance, decreased strength Mobility  Bed Mobility Bed Mobility: Supine to Sit;Sit to Supine Supine to Sit: 4: Min assist Supine to Sit Details (indicate cue type and reason): assist to manage RLE off of bed Sit to Supine: 4: Min assist Sit to Supine - Details (indicate cue type and reason): to manage RLE onto bed Transfers Transfers: Sit to Stand;Stand to Sit Sit to Stand: 3: Mod assist (MinA from elevated mat  table) Sit to Stand Details: Verbal cues for precautions/safety;Verbal cues for technique Stand to Sit: 4: Min assist Stand to Sit Details (indicate cue type and reason): Verbal cues for precautions/safety;Verbal cues for technique  Trunk/Postural Assessment  Cervical Assessment Cervical Assessment: Within Functional Limits Thoracic Assessment Thoracic Assessment: Within Functional Limits Lumbar Assessment Lumbar Assessment: Within Functional Limits Postural Control Postural Control: Deficits on evaluation  Balance Balance Balance Assessed: Yes Dynamic Sitting Balance Dynamic Sitting - Level of Assistance: 4: Min assist Dynamic Sitting - Balance Activities: Forward lean/weight shifting;Lateral lean/weight shifting;Reaching for objects;Reaching across midline Dynamic Standing Balance Dynamic Standing - Balance Support: Right upper extremity supported;Left upper extremity supported;During functional activity Dynamic Standing - Level of Assistance: 4: Min assist Dynamic Standing - Balance Activities: Lateral lean/weight shifting;Forward lean/weight shifting;Reaching for objects;Reaching across midline Dynamic Standing - Comments: standing at sink side for grooming tasks Extremity/Trunk Assessment RUE Assessment RUE Assessment: Within Functional Limits LUE Assessment LUE Assessment: Within Functional Limits  FIM:  FIM - Grooming Grooming Steps: Wash, rinse, dry face;Wash, rinse, dry hands;Oral  care, brush teeth, clean dentures;Brush, comb hair;Shave or apply make-up Grooming: 4: Steadying assist  or patient completes 3 of 4 or 4 of 5 steps FIM - Lower Body Dressing/Undressing Lower body dressing/undressing steps patient completed: Thread/unthread left pants leg;Don/Doff left sock Lower body dressing/undressing: 2: Max-Patient completed 25-49% of tasks   Refer to Care Plan for Long Term Goals  Recommendations for other services: None  Discharge Criteria: Patient will be discharged from OT if patient refuses treatment 3 consecutive times without medical reason, if treatment goals not met, if there is a change in medical status, if patient makes no progress towards goals or if patient is discharged from hospital.  The above assessment, treatment plan, treatment alternatives and goals were discussed and mutually agreed upon: by patient  Phineas Semen 09/28/2014, 10:56 AM

## 2014-09-28 NOTE — Progress Notes (Signed)
[facility]     PROGRESS NOTE    Subjective/Complaints: Had a good night. Denies pain, no cough,sob, cp.  Objective: Vital Signs: Blood pressure 142/50, pulse 97, temperature 98.2 F (36.8 C), temperature source Oral, resp. rate 18, weight 81.5 kg (179 lb 10.8 oz), SpO2 96 %. No results found.  Recent Labs  09/27/14 0550 09/28/14 0325  WBC 15.2* 11.5*  HGB 8.7* 7.9*  HCT 26.9* 24.3*  PLT 155 176    Recent Labs  09/27/14 0550 09/28/14 0325  NA 133* 133*  K 3.6 3.7  CL 98 99  GLUCOSE 152* 135*  BUN 26* 27*  CREATININE 0.82 0.83  CALCIUM 7.9* 7.8*   CBG (last 3)   Recent Labs  09/26/14 0643 09/27/14 0654  GLUCAP 162* 157*    Wt Readings from Last 3 Encounters:  09/28/14 81.5 kg (179 lb 10.8 oz)  09/27/14 84.959 kg (187 lb 4.8 oz)  08/24/14 81.194 kg (179 lb)    Physical Exam:  Constitutional: He is oriented to person, place, and time. He appears well-developed.  HENT:  Head: Normocephalic and atraumatic.  Eyes: Conjunctivae and EOM are normal. Pupils are equal, round, and reactive to light.  Neck: Normal range of motion. Neck supple. Tracheal deviation present. No thyromegaly present.  Cardiovascular: Normal rate and regular rhythm.  No murmur heard. Cardiac rate controlled  Respiratory: Effort normal and breath sounds normal. No respiratory distress. He has no wheezes. He has no rales.  GI: Soft. Bowel sounds are normal. He exhibits no distension.  Musculoskeletal: He exhibits edema.  Neurological: He is alert and oriented to person, place, and time.  Right leg limited by pain/ortho issues--1-2/5 prox to 3+ distally. UE 5/5. LLE 4/5 hf,ke 5/5 ankle. No sensory deficits  Skin:  Hip incisions are dressed and limb is appropriately tender. Chronic vascular changes in the distal legs.  Psychiatric: He has a normal mood and affect. His behavior is normal  Assessment/Plan: 1. Functional deficits secondary to  right intertrochanteric femur fracture/IMN which require 3+ hours per day of interdisciplinary therapy in a comprehensive inpatient rehab setting. Physiatrist is providing close team supervision and 24 hour management of active medical problems listed below. Physiatrist and rehab team continue to assess barriers to discharge/monitor patient progress toward functional and medical goals. FIM:                   Comprehension Comprehension Mode: Auditory Comprehension: 6-Follows complex conversation/direction: With extra time/assistive device  Expression Expression Mode: Verbal Expression: 7-Expresses complex ideas: With no assist  Social Interaction Social Interaction: 7-Interacts appropriately with others - No medications needed.  Problem Solving Problem Solving: 5-Solves complex 90% of the time/cues < 10% of the time  Memory Memory: 5-Recognizes or recalls 90% of the time/requires cueing < 10% of the time  Medical Problem List and Plan: 1. Functional deficits secondary to right intertrochanteric femur fracture. Status post IM nailing 09/24/2014. Weightbearing as tolerated 2. DVT Prophylaxis/Anticoagulation: Chronic Coumadin therapy for atrial fibrillation. Intravenous heparin until INR therapeutic. Monitor for any bleeding episodes 3. Pain Management: Hydrocodone. Monitor with increased mobility 4. Acute blood loss anemia. hgb 7.9 today. Fe+ supp. Re-check tomorrow 5. Neuropsych: This patient is capable of making decisions on his own behalf. 6. Skin/Wound Care: Routine skin checks 7. Fluids/Electrolytes/Nutrition: Strict I and O's with follow-up chemistries 8. Hypertension. Altace 5 mg daily, Toprol-XL 50 mg daily. Monitor with increased mobility 9. Escherichia coli UTI. Vantin 200 mg twice a day initiated  09/25/2014 10. Hyperlipidemia. Lipitor LOS (Days) 1 A FACE TO FACE EVALUATION WAS PERFORMED  Meshia Rau T 09/28/2014 8:18 AM

## 2014-09-28 NOTE — Progress Notes (Signed)
Occupational Therapy Session Note  Patient Details  Name: Aaron Frye MRN: [id number] Date of Birth: [birth year]  Today's Date: 09/28/2014 OT Individual Time: 3748-2707 OT Individual Time Calculation (min): 60 min    Short Term Goals: Week 1:  OT Short Term Goal 1 (Week 1): STGs= LTGs secondary to short estimated LOS  Skilled Therapeutic Interventions/Progress Updates:    Treatment session with focus on sit <> stand, stand pivot transfers, and overall activity tolerance.  Pt asleep in recliner upon arrival, requiring increased time to arouse and orient post arousal.  Pt reports need to toilet, stand pivot transfer recliner > w/c > toilet with mod assist and increased time with mobility.  Noted pt to be incontinent of urine in brief, pt completed perineal hygiene and washing up and therapist provided pt with new incontinence brief.  Min/steady assist in standing while pt completed clothing management.  Hand hygiene completed in standing at sink post toileting. Then returned to recliner and left with BLE elevated and all needs in reach.   Therapy Documentation Precautions:  Precautions Precautions: Fall Restrictions Weight Bearing Restrictions: Yes RLE Weight Bearing: Weight bearing as tolerated Pain: Pain Assessment Pain Score: 3   See FIM for current functional status  Therapy/Group: Individual Therapy  Simonne Come 09/28/2014, 3:23 PM

## 2014-09-28 NOTE — Plan of Care (Signed)
Problem: RH SKIN INTEGRITY Goal: RH STG SKIN FREE OF INFECTION/BREAKDOWN Outcome: Progressing With min. assist

## 2014-09-28 NOTE — Evaluation (Signed)
Physical Therapy Assessment and Plan  Patient Details  Name: Aaron Frye MRN: [id number] Date of Birth: [birth year]  PT Diagnosis: Abnormality of gait, Muscle weakness and Pain in joint, decreased endurance Rehab Potential: Good ELOS: 5-7 days   Today's Date: 09/28/2014 PT Individual Time: 0800-0900 PT Individual Time Calculation (min): 60 min    Problem List:  Patient Active Problem List   Diagnosis Date Noted  . Leukocytosis 09/24/2014  . Intertrochanteric fracture of right femur 09/24/2014  . Hip fracture 09/23/2014  . History of stroke 09/23/2014  . HLD (hyperlipidemia) 09/23/2014  . Fall   . Encounter for therapeutic drug monitoring 09/01/2013  . Long term current use of anticoagulant therapy 11/14/2010  . Congestive heart failure 04/18/2010  . EDEMA 10/12/2008  . Elevated lipids 10/08/2008  . Essential hypertension 10/08/2008  . Atrial fibrillation 10/08/2008  . CVA 10/08/2008  . Transient cerebral ischemia 10/08/2008    Past Medical History:  Past Medical History  Diagnosis Date  . TIA (transient ischemic attack)   . HLD (hyperlipidemia)   . HTN (hypertension)   . CVA (cerebral vascular accident)   . Atrial fibrillation   . Prostate cancer    Past Surgical History:  Past Surgical History  Procedure Laterality Date  . Appendectomy    . Tonsillectomy      Assessment & Plan Clinical Impression: Aaron Frye is a 81 y.o. right handed male with history of systolic congestive heart failure, hypertension, CVA, atrial fibrillation with chronic Coumadin. Independent prior to admission living with his wife. Presented 09/23/2014 after a fall landing on his right side. Denied any loss of consciousness. Cranial CT scan negative. INR on admission of 2.20. Coumadin was reversed and patient underwent IM nailing 09/24/2014 of right intertrochanteric femur fracture per Dr.Swinteck. Weightbearing as tolerated right lower extremity. Hospital course pain management.  Chronic Coumadin has been resumed with heparin for bridging. Acute blood loss anemia 8.7 and monitored. Escherichia coli urinary tract infection maintained on Vantin 200 mg twice a day initiated 09/25/2014. Physical and occupational therapy evaluations completed with recommendations of physical medicine rehabilitation consult. Patient was admitted for comprehensive rehabilitation program Patient transferred to CIR on 09/27/2014 .   Patient currently requires min with mobility secondary to muscle weakness, decreased cardiorespiratoy endurance and decreased postural control and decreased balance strategies.  Prior to hospitalization, patient was independent  with mobility and lived with Spouse in a House home.  Home access is  Ramped entrance, Other (comment).  Patient will benefit from skilled PT intervention to maximize safe functional mobility, minimize fall risk and decrease caregiver burden for planned discharge home with intermittent assist.  Anticipate patient will benefit from follow up [facility] at discharge.  PT - End of Session Activity Tolerance: Decreased this session Endurance Deficit: Yes Endurance Deficit Description: required multiple rest breaks secondary to fatigue during self care and standing tasks PT Assessment Rehab Potential (ACUTE/IP ONLY): Good PT Patient demonstrates impairments in the following area(s): Balance;Edema;Pain;Skin Integrity;Sensory PT Transfers Functional Problem(s): Bed Mobility;Bed to Chair;Car;Furniture PT Locomotion Functional Problem(s): Ambulation;Wheelchair Mobility PT Plan PT Intensity: Minimum of 1-2 x/day ,45 to 90 minutes PT Frequency: 5 out of 7 days PT Duration Estimated Length of Stay: 5-7 days PT Treatment/Interventions: Ambulation/gait training;Balance/vestibular training;Community reintegration;Discharge planning;DME/adaptive equipment instruction;Functional mobility training;Patient/family education;Therapeutic Exercise;Therapeutic Activities;UE/LE  Strength taining/ROM;Wheelchair propulsion/positioning PT Transfers Anticipated Outcome(s): mod (I) PT Locomotion Anticipated Outcome(s): mod (I) PT Recommendation Follow Up Recommendations: Home health PT Patient destination: Home Equipment Recommended: Rolling walker with 5" wheels  Skilled  Therapeutic Intervention Session focused on functional evaluation, orientation to rehab unit, gait training. Pt overall MinA for all upright mobility, primarily limited by decreased endurance and decreased strength in RLE. Pt oriented to role of PT in recovery, safety in room, use of call bell. Pt handed off to OT for evaluation.   PT Evaluation Precautions/Restrictions Precautions Precautions: Fall Restrictions Weight Bearing Restrictions: Yes RLE Weight Bearing: Weight bearing as tolerated General Chart Reviewed: Yes Vital SignsTherapy Vitals Pulse Rate: 97 BP: (!) 142/50 mmHg Patient Position (if appropriate): Lying Oxygen Therapy SpO2: 96 % O2 Device: Not Delivered Pain Pain Assessment Pain Assessment: 0-10 Pain Score: 5  Pain Type: Acute pain Pain Location: Leg Pain Orientation: Right Pain Descriptors / Indicators: Aching Pain Onset: Gradual Patients Stated Pain Goal: 1 Pain Intervention(s): RN made aware;Repositioned Multiple Pain Sites: No Home Living/Prior Functioning Home Living Available Help at Discharge: Family;Available 24 hours/day;Other (Comment) Type of Home: House Home Access: Ramped entrance;Other (comment) Home Layout: One level Additional Comments: has shower chair and grab bar  Lives With: Spouse Prior Function Level of Independence: Independent with basic ADLs  Able to Take Stairs?: Yes Driving: Yes Vocation: Full time employment 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 Discussed current news events with therapist without difficulty. Sensation Sensation Light Touch: Appears Intact Stereognosis: Not tested Hot/Cold: Appears Intact Proprioception: Appears Intact Coordination Gross Motor Movements are Fluid and Coordinated: No Fine Motor Movements are Fluid and Coordinated: Yes Coordination and Movement Description: decreased coordination in R LE secondary  Motor  Motor Motor: Within Functional Limits Motor - Skilled Clinical Observations: generalized weakness and endurance, decreased balance, decreased strength  Mobility Bed Mobility Bed Mobility: Supine to Sit;Sit to Supine Supine to Sit: 4: Min assist Supine to Sit Details (indicate cue type and reason): assist to manage RLE off of bed Sit to Supine: 4: Min assist Sit to Supine - Details (indicate cue type and reason): to manage RLE onto bed Transfers Transfers: Yes Sit to Stand: 3: Mod assist (MinA from elevated mat table) Sit to Stand Details: Verbal cues for precautions/safety;Verbal cues for technique Stand to Sit: 4: Min assist Stand to Sit Details (indicate cue type and reason): Verbal cues for precautions/safety;Verbal cues for technique Locomotion  Ambulation Ambulation: Yes Ambulation/Gait Assistance: 4: Min assist Ambulation Distance (Feet): 30 Feet Assistive device: Rolling walker Gait Gait velocity: decreased Stairs / Additional Locomotion Stairs: Yes Stairs Assistance: 4: Min assist Stair Management Technique: Step to pattern;Two rails Number of Stairs: 4 Wheelchair Mobility Wheelchair Mobility: Yes Wheelchair Assistance: 5: Careers information officer: Both upper extremities  Trunk/Postural Assessment  Cervical Assessment Cervical Assessment: Within Education officer, environmental Assessment: Within Functional Limits Lumbar Assessment Lumbar Assessment: Within Functional Limits Postural Control Postural Control: Deficits on evaluation  Balance Balance Balance Assessed: Yes Dynamic Sitting Balance Dynamic Sitting - Level of Assistance: 4: Min assist Dynamic Sitting - Balance Activities: Forward lean/weight shifting;Lateral lean/weight shifting;Reaching for objects;Reaching across midline Dynamic Standing Balance Dynamic Standing - Balance Support: Right upper extremity supported;Left upper extremity supported;During functional activity Dynamic Standing - Level of Assistance: 4: Min assist Dynamic Standing - Balance Activities: Lateral lean/weight shifting;Forward lean/weight shifting;Reaching for objects;Reaching across midline Dynamic Standing - Comments:  standing at sink side for grooming tasks Extremity Assessment  RUE Assessment RUE Assessment: Within Functional Limits LUE Assessment LUE Assessment: Within Functional Limits RLE Assessment RLE Assessment: Exceptions to [facility] RLE AROM (degrees) Overall AROM Right Lower Extremity: Deficits;Due to pain;Due to decreased strength RLE PROM (degrees) Overall PROM Right Lower Extremity: Deficits (due to fracture) RLE Strength RLE Overall Strength: Deficits RLE Overall Strength Comments: hip 2+/5, knee/ankle 4+/5 LLE Assessment LLE Assessment: Within Functional Limits  FIM:  FIM - Locomotion: Ambulation Ambulation/Gait Assistance: 4: Min assist   Refer to Care Plan for Long Term Goals  Recommendations for other services: None  Discharge Criteria: Patient will be discharged from PT if patient refuses treatment 3 consecutive times without medical reason, if treatment goals not met, if there is a change in medical status, if patient makes no progress towards goals or if patient is discharged from hospital.  The above assessment, treatment plan, treatment alternatives and  goals were discussed and mutually agreed upon: by patient  Rada Hay 09/28/2014, 10:55 AM

## 2014-09-28 NOTE — Progress Notes (Signed)
Patient information reviewed and entered into eRehab system by Taylin Mans, RN, CRRN, PPS Coordinator.  Information including medical coding and functional independence measure will be reviewed and updated through discharge.     Per nursing patient was given "Data Collection Information Summary for Patients in Inpatient Rehabilitation Facilities with attached "Privacy Act Statement-Health Care Records" upon admission.  

## 2014-09-28 NOTE — Progress Notes (Signed)
ANTICOAGULATION CONSULT NOTE - Follow Up Consult  Pharmacy Consult for heparin Indication: atrial fibrillation   Labs:  Recent Labs  09/25/14 0542  09/26/14 0425 09/26/14 0440  09/27/14 0550 09/27/14 1512 09/27/14 1804 09/28/14 0325  HGB 10.3*  --  9.7*  --   --  8.7*  --   --  7.9*  HCT 32.6*  --  30.2*  --   --  26.9*  --   --  24.3*  PLT 156  --  147*  --   --  155  --   --  176  LABPROT 18.4*  --   --  17.4*  --  16.7*  --   --  17.1*  INR 1.51*  --   --  1.41  --  1.34  --   --  1.38  HEPARINUNFRC  --   < >  --  0.47  < > 0.17* 1.86* 0.25* 0.28*  CREATININE 0.89  --  0.95  --   --  0.82  --   --   --   < > = values in this interval not displayed.    Assessment: 81yo remains slightly subtherapeutic on heparin after rate increase.  Goal of Therapy:  Heparin level 0.3-0.7 units/ml   Plan:  Will increase heparin by another 1-2 units/kg/hr to 1600 units/hr and check level in [place], PharmD, BCPS  09/28/2014,4:47 AM

## 2014-09-28 NOTE — Progress Notes (Signed)
3/29  Pharmacy-Heparin   Heparin level 0.68  A/P:  81yo male on heparin bridge to Coumadin.  Heparin level is 0.68 units/ml Hg with trend down but no obvious bleeding reported  -Decrease heparin to 1500 units/hr -f/u am labs  Thanks for allowing pharmacy to be a part of this patient's care.  Excell Seltzer, PharmD Clinical Pharmacist, [phone]

## 2014-09-28 NOTE — Care Management Note (Signed)
Inpatient [place] Individual Statement of Services  Patient Name:  Aaron Frye  Date:  09/28/2014  Welcome to the [facility].  Our goal is to provide you with an individualized program based on your diagnosis and situation, designed to meet your specific needs.  With this comprehensive rehabilitation program, you will be expected to participate in at least 3 hours of rehabilitation therapies Monday-Friday, with modified therapy programming on the weekends.  Your rehabilitation program will include the following services:  Physical Therapy (PT), Occupational Therapy (OT), 24 hour per day rehabilitation nursing, Therapeutic Recreaction (TR), Case Management (Social Worker), Rehabilitation Medicine, Nutrition Services and Pharmacy Services  Weekly team conferences will be held on Wednesday to discuss your progress.  Your Social Worker will talk with you frequently to get your input and to update you on team discussions.  Team conferences with you and your family in attendance may also be held.  Expected length of stay: 7 days Overall anticipated outcome: mod/i level  Depending on your progress and recovery, your program may change. Your Social Worker will coordinate services and will keep you informed of any changes. Your Social Worker's name and contact numbers are listed  below.  The following services may also be recommended but are not provided by the [facility] will be made to provide these services after discharge if needed.  Arrangements include referral to agencies that provide these services.  Your insurance has been verified to be:  [place] Your primary doctor is:  Juanita Craver  Pertinent information will be shared with your doctor and your insurance company.  Social Worker:  Ovidio Kin, [place] or ([phone]  Information discussed with and copy given to patient by: [name], 09/28/2014, 12:27 PM

## 2014-09-28 NOTE — Progress Notes (Signed)
Social Work Assessment and Plan Social Work Assessment and Plan  Patient Details  Name: Aaron Frye MRN: [id number] Date of Birth: March 20, 1925  Today's Date: 09/28/2014  Problem List:  Patient Active Problem List   Diagnosis Date Noted  . Leukocytosis 09/24/2014  . Intertrochanteric fracture of right femur 09/24/2014  . Hip fracture 09/23/2014  . History of stroke 09/23/2014  . HLD (hyperlipidemia) 09/23/2014  . Fall   . Encounter for therapeutic drug monitoring 09/01/2013  . Long term current use of anticoagulant therapy 11/14/2010  . Congestive heart failure 04/18/2010  . EDEMA 10/12/2008  . Elevated lipids 10/08/2008  . Essential hypertension 10/08/2008  . Atrial fibrillation 10/08/2008  . CVA 10/08/2008  . Transient cerebral ischemia 10/08/2008   Past Medical History:  Past Medical History  Diagnosis Date  . TIA (transient ischemic attack)   . HLD (hyperlipidemia)   . HTN (hypertension)   . CVA (cerebral vascular accident)   . Atrial fibrillation   . Prostate cancer    Past Surgical History:  Past Surgical History  Procedure Laterality Date  . Appendectomy    . Tonsillectomy     Social History:  reports that he has never smoked. He does not have any smokeless tobacco history on file. He reports that he does not drink alcohol or use illicit drugs.  Family / Support Systems Marital Status: Married How Long?: 82 years Patient Roles: Spouse, Parent, Other (Comment) Secretary/administrator) Spouse/Significant Other: Aaron Frye [phone] Children: Daughter in [place] and son who is local Other Supports: friends and employees Anticipated Caregiver: Wife Ability/Limitations of Caregiver: Wife is 59 yo but in good health, can provide supervision to light min assist level Caregiver Availability: 24/7 Family Dynamics: Close knit family who rely upon one another.  Their son is local and comes by but has health issues of his own.  They have friends and chruch members who stop by  and offer assistance.  Social History Preferred language: English Religion:  Cultural Background: No issues Education: High School Read: Yes Write: Yes Employment Status: Retired Date Retired/Disabled/Unemployed: Web designer Issues: No issues Guardian/Conservator: None-according to MD pt is capable of making his own decisions while here.   Abuse/Neglect Physical Abuse: Denies Verbal Abuse: Denies Sexual Abuse: Denies Exploitation of patient/patient's resources: Denies Self-Neglect: Denies  Emotional Status Pt's affect, behavior adn adjustment status: Pt is motivated to improve and get home soon.  He is glad to be here on rehab and hopes to be home soon.  He is trying his best and has never had to deal with a broken bone before so this is new for him.  He has always been one to be independent and taken care of himself. Recent Psychosocial Issues: Other health issues but they were managed and he remained independent Pyschiatric History: No history he feels he is doing well and no intervention is necessary.  Will follow along while he is here if any services are needed. Substance Abuse History: No issues  Patient / Family Perceptions, Expectations & Goals Pt/Family understanding of illness & functional limitations: Pt and wife are able to explain his broken leg and the treatment plan for it.  Both have spoken with the MD and have a good understanding of his plan.  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, Father, Fish farm manager, Child psychotherapist, Home owner, etc Anticipated changes in roles/activities/participation: resume Pt/family expectations/goals: Pt states: " I want to be able to do  as much as I can for myself before I leave here."  Wife states: " I hope he can move around some I can't lift him."  [employer]: None Premorbid Home Care/DME Agencies:  None Transportation available at discharge: Wife  Discharge Planning Living Arrangements: Spouse/significant other Support Systems: Spouse/significant other, Children, Water engineer, Social worker community Type of Residence: Private residence Insurance underwriter Resources: [employer], Multimedia programmer (specify) Nurse, mental health) Financial Resources: Fish farm manager, Employment Financial Screen Referred: No Living Expenses: Own Money Management: Spouse, Patient Does the patient have any problems obtaining your medications?: No Home Management: Wife and hired assist for the cleaning Patient/Family Preliminary Plans: Return home with wife who will be assisitng him with his care.  Both hope he will get stronger and be somewhat mobile at discharge from here. Wife plans to be here and observe him in therapies, but also is at their shop 3-4 days a week.  Await team's evaluations. Social Work Anticipated Follow Up Needs: HH/OP  Clinical Impression Pleasant gentleman who is willing to work hard in therapies and hoping to go home soon.  Supportive wife who is able to assist him and will be in to participate in therapies. Await team's evaluations and come up with a safe discharge plan.  [name] 09/28/2014, 12:25 PM

## 2014-09-28 NOTE — Progress Notes (Signed)
3/29  Pharmacy-Heparin 1610  Heparin level 0.64  A/P:  81yo male on heparin bridge to Coumadin.  Heparin level therapeutic after rate adjustment early this AM.  No bleeding noted.  -Continue heparin 1600 units/hr -Repeat Heparin level at 2100 to verify therapeutic x 2  Gracy Bruins, PharmD Clinical Pharmacist [facility]

## 2014-09-29 ENCOUNTER — Inpatient Hospital Stay (HOSPITAL_COMMUNITY): Payer: Medicare Other | Admitting: Rehabilitation

## 2014-09-29 ENCOUNTER — Inpatient Hospital Stay (HOSPITAL_COMMUNITY): Payer: Medicare Other | Admitting: Occupational Therapy

## 2014-09-29 ENCOUNTER — Inpatient Hospital Stay (HOSPITAL_COMMUNITY): Payer: Medicare Other

## 2014-09-29 DIAGNOSIS — I509 Heart failure, unspecified: Secondary | ICD-10-CM

## 2014-09-29 LAB — CBC
HEMATOCRIT: 23.6 % — AB (ref 39.0–52.0)
HEMOGLOBIN: 7.5 g/dL — AB (ref 13.0–17.0)
MCH: 24.8 pg — ABNORMAL LOW (ref 26.0–34.0)
MCHC: 31.8 g/dL (ref 30.0–36.0)
MCV: 77.9 fL — AB (ref 78.0–100.0)
Platelets: 216 10*3/uL (ref 150–400)
RBC: 3.03 MIL/uL — AB (ref 4.22–5.81)
RDW: 15.9 % — ABNORMAL HIGH (ref 11.5–15.5)
WBC: 12.9 10*3/uL — ABNORMAL HIGH (ref 4.0–10.5)

## 2014-09-29 LAB — HEPARIN LEVEL (UNFRACTIONATED): HEPARIN UNFRACTIONATED: 0.49 [IU]/mL (ref 0.30–0.70)

## 2014-09-29 LAB — PREPARE RBC (CROSSMATCH)

## 2014-09-29 LAB — PROTIME-INR
INR: 1.53 — ABNORMAL HIGH (ref 0.00–1.49)
PROTHROMBIN TIME: 18.6 s — AB (ref 11.6–15.2)

## 2014-09-29 MED ORDER — DIPHENHYDRAMINE HCL 25 MG PO CAPS
25.0000 mg | ORAL_CAPSULE | Freq: Once | ORAL | Status: AC
  Administered 2014-09-29: 25 mg via ORAL
  Filled 2014-09-29: qty 1

## 2014-09-29 MED ORDER — SODIUM CHLORIDE 0.9 % IV SOLN: Freq: Once | INTRAVENOUS | Status: DC

## 2014-09-29 MED ORDER — MENTHOL 3 MG MT LOZG
1.0000 | LOZENGE | OROMUCOSAL | Status: DC | PRN
  Administered 2014-09-30 – 2014-10-08 (×3): 3 mg via ORAL
  Filled 2014-09-29: qty 9

## 2014-09-29 MED ORDER — LORATADINE 10 MG PO TABS
10.0000 mg | ORAL_TABLET | Freq: Every day | ORAL | Status: DC
  Administered 2014-09-29 – 2014-10-08 (×10): 10 mg via ORAL
  Filled 2014-09-29 (×11): qty 1

## 2014-09-29 MED ORDER — ACETAMINOPHEN 325 MG PO TABS: 650.0000 mg | ORAL_TABLET | Freq: Once | ORAL | Status: DC

## 2014-09-29 MED ORDER — WARFARIN SODIUM 5 MG PO TABS
5.0000 mg | ORAL_TABLET | Freq: Once | ORAL | Status: AC
  Administered 2014-09-29: 5 mg via ORAL
  Filled 2014-09-29: qty 1

## 2014-09-29 MED ORDER — FLUTICASONE PROPIONATE 50 MCG/ACT NA SUSP
1.0000 | Freq: Every day | NASAL | Status: DC
  Administered 2014-09-29 – 2014-10-08 (×10): 1 via NASAL
  Filled 2014-09-29: qty 16

## 2014-09-29 MED ORDER — FUROSEMIDE 10 MG/ML IJ SOLN
20.0000 mg | Freq: Once | INTRAMUSCULAR | Status: AC
  Administered 2014-09-29: 20 mg via INTRAVENOUS
  Filled 2014-09-29: qty 2

## 2014-09-29 NOTE — Progress Notes (Signed)
[place] PHYSICAL MEDICINE & REHABILITATION     PROGRESS NOTE    Subjective/Complaints: No new complaints. Likes therapy so far. Not a big breakfast eater  Objective: Vital Signs: Blood pressure 128/57, pulse 95, temperature 98.5 F (36.9 C), temperature source Oral, resp. rate 18, weight 81.103 kg (178 lb 12.8 oz), SpO2 99 %. No results found.  Recent Labs  09/28/14 0325 09/29/14 0600  WBC 11.5* 12.9*  HGB 7.9* 7.5*  HCT 24.3* 23.6*  PLT 176 216    Recent Labs  09/27/14 0550 09/28/14 0325  NA 133* 133*  K 3.6 3.7  CL 98 99  GLUCOSE 152* 135*  BUN 26* 27*  CREATININE 0.82 0.83  CALCIUM 7.9* 7.8*   CBG (last 3)   Recent Labs  09/27/14 0654  GLUCAP 157*    Wt Readings from Last 3 Encounters:  09/29/14 81.103 kg (178 lb 12.8 oz)  09/27/14 84.959 kg (187 lb 4.8 oz)  08/24/14 81.194 kg (179 lb)    Physical Exam:  Constitutional: He is oriented to person, place, and time. He appears well-developed.  HENT:  Head: Normocephalic and atraumatic.  Eyes: Conjunctivae and EOM are normal. Pupils are equal, round, and reactive to light.  Neck: Normal range of motion. Neck supple. Tracheal deviation present. No thyromegaly present.  Cardiovascular: Normal rate and regular rhythm.  No murmur heard. Cardiac rate controlled  Respiratory: Effort normal and breath sounds normal. No respiratory distress. He has no wheezes. He has no rales.  GI: Soft. Bowel sounds are normal. He exhibits no distension.  Musculoskeletal: He exhibits edema.  Neurological: He is alert and oriented to person, place, and time.  Right leg limited by pain/ortho issues--1-2/5 prox to 3+ distally. UE 5/5. LLE 4/5 hf,ke 5/5 ankle. No sensory deficits  Skin:  Hip incisions are dressed and limb is appropriately tender. Chronic vascular changes in the distal legs.  Psychiatric: He has a normal mood and affect. His behavior is normal  Assessment/Plan: 1. Functional deficits secondary to  right intertrochanteric femur fracture/IMN which require 3+ hours per day of interdisciplinary therapy in a comprehensive inpatient rehab setting. Physiatrist is providing close team supervision and 24 hour management of active medical problems listed below. Physiatrist and rehab team continue to assess barriers to discharge/monitor patient progress toward functional and medical goals. FIM:    FIM - Lower Body Dressing/Undressing Lower body dressing/undressing steps patient completed: Thread/unthread left pants leg, Don/Doff left sock Lower body dressing/undressing: 2: Max-Patient completed 25-49% of tasks  FIM - Toileting Toileting steps completed by patient: Adjust clothing prior to toileting, Performs perineal hygiene, Adjust clothing after toileting Toileting Assistive Devices: Grab bar or rail for support Toileting: 4: Steadying assist  FIM - Radio producer Devices: Elevated toilet seat, Grab bars Toilet Transfers: 3-To toilet/BSC: Mod A (lift or lower assist), 3-From toilet/BSC: Mod A (lift or lower assist)  FIM - Bed/Chair Transfer Bed/Chair Transfer: 3: Chair or W/C > Bed: Mod A (lift or lower assist), 3: Sit > Supine: Mod A (lifting assist/Pt. 50-74%/lift 2 legs)  FIM - Locomotion: Wheelchair Locomotion: Wheelchair: 2: Travels 50 - 149 ft with supervision, cueing or coaxing FIM - Locomotion: Ambulation Ambulation/Gait Assistance: 4: Min assist Locomotion: Ambulation: 1: Travels less than 50 ft with minimal assistance (Pt.>75%)  Comprehension Comprehension Mode: Auditory Comprehension: 6-Follows complex conversation/direction: With extra time/assistive device  Expression Expression Mode: Verbal Expression: 6-Expresses complex ideas: With extra time/assistive device  Social Interaction Social Interaction: 6-Interacts appropriately with others with medication  or extra time (anti-anxiety, antidepressant).  Problem Solving Problem Solving:  5-Solves complex 90% of the time/cues < 10% of the time  Memory Memory: 5-Recognizes or recalls 90% of the time/requires cueing < 10% of the time  Medical Problem List and Plan: 1. Functional deficits secondary to right intertrochanteric femur fracture. Status post IM nailing 09/24/2014. Weightbearing as tolerated 2. DVT Prophylaxis/Anticoagulation: Chronic Coumadin therapy for atrial fibrillation. Intravenous heparin until INR therapeutic. Monitor for any bleeding episodes 3. Pain Management: Hydrocodone. Monitor with increased mobility 4. Acute blood loss anemia. hgb 7.5 today. Asymptomatic at present. Fe++ supp  -re-check thursday 5. Neuropsych: This patient is capable of making decisions on his own behalf. 6. Skin/Wound Care: Routine skin checks 7. Fluids/Electrolytes/Nutrition: encourage increased PO intake. 8. Hypertension. Altace 5 mg daily, Toprol-XL 50 mg daily. Monitor with increased mobility 9. Escherichia coli UTI. Vantin 200 mg twice a day initiated 09/25/2014 10. Hyperlipidemia. Lipitor 11. Loose stools---?related to abx  -stop colace  -probiotic LOS (Days) 2 A FACE TO FACE EVALUATION WAS PERFORMED  Aaron Frye 09/29/2014 8:23 AM

## 2014-09-29 NOTE — Progress Notes (Signed)
Physical Therapy Session Note  Patient Details  Name: Aaron Frye MRN: [id number] Date of Birth: Aug 28, 1924  Today's Date: 09/29/2014 PT Individual Time: 0900-0930 PT Individual Time Calculation (min): 30 min   Short Term Goals: Week 1:  PT Short Term Goal 1 (Week 1): = LTG due ELOS  Skilled Therapeutic Interventions/Progress Updates:   Session focused on functional gait with RW with min A (45' and 55'), sit to stands with min to mod A needed due to posterior lean with cues for hand placement and technique, and BLE seated therex including LAQ, heel/toe raises, and marching in place x 10-15 reps each to aid with overall strength to increase functional mobility. Returned to room and positioned with all needs in reach.   Therapy Documentation Precautions:  Precautions Precautions: Fall Restrictions Weight Bearing Restrictions: Yes RLE Weight Bearing: Weight bearing as tolerated   Pain: Denies pain. C/o his throat bothering him and having to use the bathroom a lot already this morning  See FIM for current functional status  Therapy/Group: Individual Therapy  [name] [facility] 09/29/2014, 10:43 AM

## 2014-09-29 NOTE — Progress Notes (Signed)
ANTICOAGULATION CONSULT NOTE - Follow Up Consult  Pharmacy Consult for Heparin and Coumadin Indication: atrial fibrillation  Allergies  Allergen Reactions  . Penicillins     REACTION: unspecified    Patient Measurements: Weight: 178 lb 12.8 oz (81.103 kg) Heparin Dosing Weight:   Vital Signs: Temp: 98.5 F (36.9 C) (03/30 0619) Temp Source: Oral (03/30 0619) BP: 128/57 mmHg (03/30 0619) Pulse Rate: 95 (03/30 0619)  Labs:  Recent Labs  09/27/14 0550  09/28/14 0325 09/28/14 1500 09/28/14 2037 09/29/14 0500 09/29/14 0600  HGB 8.7*  --  7.9*  --   --   --  7.5*  HCT 26.9*  --  24.3*  --   --   --  23.6*  PLT 155  --  176  --   --   --  216  LABPROT 16.7*  --  17.1*  --   --   --  18.6*  INR 1.34  --  1.38  --   --   --  1.53*  HEPARINUNFRC 0.17*  < > 0.28* 0.64 0.68 0.49  --   CREATININE 0.82  --  0.83  --   --   --   --   < > = values in this interval not displayed.  Estimated Creatinine Clearance: 63.9 mL/min (by C-G formula based on Cr of 0.83).   Medications:  Scheduled:  . aspirin  81 mg Oral Daily  . atorvastatin  40 mg Oral Daily  . cefpodoxime  200 mg Oral Q12H  . ferrous sulfate  325 mg Oral BID WC  . fluticasone  1 spray Each Nare Daily  . loratadine  10 mg Oral Daily  . metoprolol succinate  50 mg Oral Q breakfast  . ramipril  5 mg Oral Daily  . saccharomyces boulardii  250 mg Oral BID  . senna  2 tablet Oral QHS  . Warfarin - Pharmacist Dosing Inpatient   Does not apply q1800    Assessment: 81yo male with AFib.  Hg is stable and pltc wnl.  Pt did have some bleeding with BM last PM, but states she has hemorrhoids, no other bleeding noted.  Heparin level 0.49, INR 1.53- trending up.  Goal of Therapy:  INR 2-3 and Heparin level 0.3-0.7 Monitor platelets by anticoagulation protocol: Yes   Plan:  Continue heparin 1500 units/hr Coumadin 5mg  today Monitor for s/s of bleeding Daily HL, CBC, INR  Gracy Bruins, PharmD Clinical Pharmacist [facility]

## 2014-09-29 NOTE — Progress Notes (Signed)
Patient complaining of sore throat, hoarse voice and dry irritating cough off and on. No chest pain or palpations.   Exam: Lungs: scattered crackles at bases   Heart: IRIR  A/p 1. Laryngitis: Likely due to PND/alleriges. Will treat symptomatically with Flonase, claritin and claritin for now.  Flutter valve every 2 hours. Monitor for now.

## 2014-09-29 NOTE — IPOC Note (Signed)
Overall Plan of Care [facility]) Patient Details Name: Aaron Frye MRN: [id number] DOB: [birth year]  Admitting Diagnosis: FEMUR FX  Hospital Problems: Principal Problem:   Intertrochanteric fracture of right femur Active Problems:   Essential hypertension   Atrial fibrillation   Congestive heart failure     Functional Problem List: Nursing Bladder, Bowel, Endurance, Medication Management, Motor, Nutrition, Pain, Skin Integrity  PT Balance, Edema, Pain, Skin Integrity, Sensory  OT Balance, Edema, Endurance, Motor, Pain, Safety  SLP    TR         Basic ADL's: OT Grooming, Bathing, Dressing, Toileting     Advanced  ADL's: OT Light Housekeeping (pt washes dishes at home)     Transfers: PT Bed Mobility, Bed to Chair, Car, Manufacturing systems engineer, Metallurgist: PT Ambulation, Emergency planning/management officer     Additional Impairments: OT None  SLP        TR      Anticipated Outcomes Item Anticipated Outcome  Self Feeding n/a  Swallowing      Basic self-care  Mod I - supervision  Toileting  Mod I - supervision   Bathroom Transfers Mod I - toilet , supervision - shower  Bowel/Bladder  min assist with assistive device incontinent aid  Transfers  mod (I)  Locomotion  mod (I)  Communication     Cognition     Pain  less than 3  Safety/Judgment  min assist    Therapy Plan: PT Intensity: Minimum of 1-2 x/day ,45 to 90 minutes PT Frequency: 5 out of 7 days PT Duration Estimated Length of Stay: 5-7 days OT Intensity: Minimum of 1-2 x/day, 45 to 90 minutes OT Frequency: 5 out of 7 days OT Duration/Estimated Length of Stay: 5-7 days         Team Interventions: Nursing Interventions Patient/Family Education, Bladder Management, Bowel Management, Pain Management, Medication Management, Skin Care/Wound Management, Discharge Planning  PT interventions Ambulation/gait training, Balance/vestibular training, Community reintegration, Discharge planning,  DME/adaptive equipment instruction, Functional mobility training, Patient/family education, Therapeutic Exercise, Therapeutic Activities, UE/LE Strength taining/ROM, Wheelchair propulsion/positioning  OT Interventions Training and development officer, Discharge planning, Pain management, Self Care/advanced ADL retraining, Therapeutic Activities, UE/LE Coordination activities, Functional mobility training, Patient/family education, Therapeutic Exercise, Community reintegration, Engineer, drilling, UE/LE Strength taining/ROM  SLP Interventions    TR Interventions    SW/CM Interventions Discharge Planning, Psychosocial Support, Patient/Family Education    Team Discharge Planning: Destination: PT-Home ,OT- Home , SLP-  Projected Follow-up: PT-Home health PT, OT-  24 hour supervision/assistance, Outpatient OT, SLP-  Projected Equipment Needs: PT-Rolling walker with 5" wheels, OT- 3 in 1 bedside comode, SLP-  Equipment Details: PT- , OT-has shower seat and grab bars in walk in shower Patient/family involved in discharge planning: PT- Patient,  OT-Patient, SLP-   MD ELOS: 7 days Medical Rehab Prognosis:  Excellent Assessment: The patient has been admitted for CIR therapies with the diagnosis of left IT hip fx. The team will be addressing functional mobility, strength, stamina, balance, safety, adaptive techniques and equipment, self-care, bowel and bladder mgt, patient and caregiver education, activity tolerance, pain mgt, ortho precautions, community reintegration. Goals have been set at mod I for mobility and self-care.    Meredith Staggers, MD, FAAPMR      See Team Conference Notes for weekly updates to the plan of care

## 2014-09-29 NOTE — Progress Notes (Signed)
Social Work Patient ID: Aaron Frye, male   DOB: [birth year], 81 y.o.   MRN: [id number] Met with pt and spoke with wife via telephone to discuss team conference goals-supervision with some cueing and discharge 4/5. MD to give pt blood transfusion and treat his UTI so should start feeling better.  Wife can be there with pt and will come in end of week to observe in therapies. Will work on discharge plans.

## 2014-09-29 NOTE — Progress Notes (Signed)
Physical Therapy Session Note  Patient Details  Name: Aaron Frye MRN: [id number] Date of Birth: 07/31/1924  Today's Date: 09/29/2014 PT Individual Time: 1015-1100 and 1415-1515 PT Individual Time Calculation (min): 45 min and 60 mins  Short Term Goals: Week 1:  PT Short Term Goal 1 (Week 1): = LTG due ELOS  Skilled Therapeutic Interventions/Progress Updates:   AM session:  Pt received sitting in w/c in room, agreeable to therapy session.  Daughter present in room but did not join session.  States that he did not get rest last night due to frequently using the restroom.  Also seems to have less energy and overall feeling of "malaise."  Assisted pt to/from therapy gym via w/c at total A level for time management.  Once in therapy gym, transferred to/from therapy mat via stand pivot with RW at min/guard level.  Cues for safety and sequencing.  Once in therapy mat, transitioned to/from supine with min A for assisting RLE into/out of bed.  Performed SAQ's x 10 reps, quad sets x 10 reps, heel slides x 10 reps, hip abd x 10 reps, and SLR (active assisted) x 10 reps on RLE.  Tolerated well, but did note pain throughout (received Tylenol at beginning of session).  Addressed bed mobility while on mat and introduced leg lifter.  Pt unable to sit upright then use leg lifter due to increased pain, however would like to continue to attempt to use during sessions for increased independence.  Ended session with gait x 60' with RW at min/guard to close S level.  Cues for upright posture, increased L step length and increased WB through RLE for less antalgic gait.  Assisted pt back to room and transferred to recliner as stated above.  Provided ice pack for pain and swelling control.  Pt left with all needs in reach.    PM session:  Pt received sitting in recliner, agreeable to therapy, but notably fatigued.  Transferred to w/c and pt self propelled to therapy gym x 100' with BUEs at S level for overall  strengthening and endurance.  Once in therapy gym, transferred to nustep.  Performed seated nustep x 10 mins with BLUEs/UEs at level 4 resistance to increase overall strengthening and endurance as well as increased ROM in R hip.  Transitioned to performing stairs as he will have to perform at home x 4, 6" steps with L handrail and R HHA in step to fashion.  Pt able to perform at min/mod level.  Ended session with gait to ADL apt x 45' with RW at S level with cues as mentioned in earlier session.  Once in apt, had pt perform bed mobility to bed to simulate home.  Performed at min A level with cues for self assisting RLE with LLE into bed, however still needed assist to fully elevate into bed.  Pt assisted back to w/c and to room to recliner as above.  Left with all needs in reach.   Therapy Documentation Precautions:  Precautions Precautions: Fall Restrictions Weight Bearing Restrictions: Yes RLE Weight Bearing: Weight bearing as tolerated   Vital Signs: Therapy Vitals Temp: 97.7 F (36.5 C) Temp Source: Oral Pulse Rate: 88 Resp: 16 BP: (!) 98/39 mmHg Patient Position (if appropriate): Sitting Oxygen Therapy SpO2: 99 % O2 Device: Not Delivered Pain: Pain Assessment Pain Score: 2    Locomotion : Ambulation Ambulation/Gait Assistance: 4: Min assist   See FIM for current functional status  Therapy/Group: Individual Therapy  Denice Bors 09/29/2014,  2:35 PM

## 2014-09-29 NOTE — Patient Care Conference (Signed)
Inpatient RehabilitationTeam Conference and Plan of Care Update Date: 09/29/2014   Time: 2;00 PM    Patient Name: Aaron Frye      Medical Record Number: [id number]  Date of Birth: 1924/07/11 Sex: Male         Room/Bed: 4M04C/4M04C-01 Payor Info: Payor: MEDICARE / Plan: MEDICARE PART A AND B / Product Type: *No Product type* /    Admitting Diagnosis: FEMUR FX  Admit Date/Time:  09/27/2014  4:49 PM Admission Comments: No comment available   Primary Diagnosis:  Intertrochanteric fracture of right femur Principal Problem: Intertrochanteric fracture of right femur  Patient Active Problem List   Diagnosis Date Noted  . Leukocytosis 09/24/2014  . Intertrochanteric fracture of right femur 09/24/2014  . Hip fracture 09/23/2014  . History of stroke 09/23/2014  . HLD (hyperlipidemia) 09/23/2014  . Fall   . Encounter for therapeutic drug monitoring 09/01/2013  . Long term current use of anticoagulant therapy 11/14/2010  . Congestive heart failure 04/18/2010  . EDEMA 10/12/2008  . Elevated lipids 10/08/2008  . Essential hypertension 10/08/2008  . Atrial fibrillation 10/08/2008  . CVA 10/08/2008  . Transient cerebral ischemia 10/08/2008    Expected Discharge Date: Expected Discharge Date: 10/05/14  Team Members Present: Physician leading conference: Dr. Alger Simons Social Worker Present: Ovidio Kin, LCSW Nurse Present: Elliot Cousin, RN PT Present: Raylene Everts, PT;Emily Rinaldo Cloud, PT OT Present: Benay Pillow, OT PPS Coordinator present : Daiva Nakayama, RN, CRRN     Current Status/Progress Goal Weekly Team Focus  Medical   left hip fx. anemia, uti  improve pain control and activity tolerance  bp mgt, anemia rx   Bowel/Bladder   Continent to bowel and bladder.Wears depends.  To continue continent to bowel and bladder.  To monitor bladder and bowel function Q. shift.   Swallow/Nutrition/ Hydration     na        ADL's   min -mod A for self care, mod A toilet transfer, min  A for toileting, decreased activity tolerance  Mod 1- supervision overall  self care retraining, functional mobility, functional transfers, pt/family education, activity tolerance   Mobility   min A overall for bed mobility, transfers, gait  mod I overall  activity tolerance, gait, therex, transfers, bed mobility, ptfamily education   Communication     na        Safety/Cognition/ Behavioral Observations     To keep pt. free from fall during his stay in rehab.      Pain   Intermitten pain on right leg.Uses 1 to 2 Vicodin PRN.  To keep pain levels less than 3,on scale 1 to 10.  To monitor pain levels Q 2 hrs. and PRN.   Skin   Incision on right leg dry and intact.  To keep skin free of pressure ulcers.  To monitor skin Q. shift.      *See Care Plan and progress notes for long and short-term goals.  Barriers to Discharge: anemia, pain, ortho precautions    Possible Resolutions to Barriers:  pain mgt, blood txfusion, adaptive equipment    Discharge Planning/Teaching Needs:  Home with wife who can provide light level of assist      Team Discussion:  Hemoglobin low-plans to give blood transfusion today.  Treating for UTI. Pt not feeling well and tired in therapies. Goals set at mod/i level.  Hope once feeling better will do well here.  Revisions to Treatment Plan:  None   Continued Need for Acute Rehabilitation Level  of Care: The patient requires daily medical management by a physician with specialized training in physical medicine and rehabilitation for the following conditions: Daily direction of a multidisciplinary physical rehabilitation program to ensure safe treatment while eliciting the highest outcome that is of practical value to the patient.: Yes Daily medical management of patient stability for increased activity during participation in an intensive rehabilitation regime.: Yes Daily analysis of laboratory values and/or radiology reports with any subsequent need for medication  adjustment of medical intervention for : Post surgical problems;Other  Beuford Garcilazo, Gardiner Rhyme 09/29/2014, 3:41 PM

## 2014-09-29 NOTE — Progress Notes (Signed)
Occupational Therapy Session Note  Patient Details  Name: Aaron Frye MRN: [id number] Date of Birth: [birth year]  Today's Date: 09/29/2014 OT Individual Time: 1300-1345 OT Individual Time Calculation (min): 45 min    Short Term Goals: Week 1:  OT Short Term Goal 1 (Week 1): STGs= LTGs secondary to short estimated LOS  Skilled Therapeutic Interventions/Progress Updates:  Upon entering the room, pt seated in recliner chair asleep. Pt reporting 5/10 c/o pain in R hip during session. Pt also grabbing throat and reporting that his voice is gone and he did not sleep well last night. STS from recliner chair with Min A and ambulation to bathroom with use of RW and steady assist. Pt requiring Mod A transfer onto toilet with use of grab bars for lowering/lifting assist. Pt has been incontinent in brief with urine but having BM during this toilet session. Disposable brief donned while pt standing with steady assist. Pt ambulated with RW and min A to sink side to wash hands before returning to recliner chair. Therapist applied ice pack to R hip in order to decrease pain. Call bell and all needed items within reach upon exiting the room.   Therapy Documentation Precautions:  Precautions Precautions: Fall Restrictions Weight Bearing Restrictions: Yes RLE Weight Bearing: Weight bearing as tolerated Vital Signs: Therapy Vitals Temp: 97.7 F (36.5 C) Temp Source: Oral Pulse Rate: 88 Resp: 16 BP: (!) 98/39 mmHg Patient Position (if appropriate): Sitting Oxygen Therapy SpO2: 99 % O2 Device: Not Delivered Pain: Pain Assessment Pain Assessment: 0-10 Pain Score: 5  Pain Location: Hip Pain Orientation: Right Pain Descriptors / Indicators: Aching Pain Onset: On-going Patients Stated Pain Goal: 2 Pain Intervention(s): Medication (See eMAR)  See FIM for current functional status  Therapy/Group: Individual Therapy  Phineas Semen 09/29/2014, 2:07 PM

## 2014-09-29 NOTE — Progress Notes (Signed)
Social Work Aaron Hashimoto, LCSW Social Worker Signed  Patient Care Conference 09/29/2014  3:41 PM    Expand All Collapse All   Inpatient RehabilitationTeam Conference and Plan of Care Update Date: 09/29/2014   Time: 2;00 PM     Patient Name: Aaron Frye       Medical Record Number: [id number]  Date of Birth: [birth year] Sex: Male         Room/Bed: 4M04C/4M04C-01 Payor Info: Payor: MEDICARE / Plan: MEDICARE PART A AND B / Product Type: *No Product type* /    Admitting Diagnosis: FEMUR FX   Admit Date/Time:  09/27/2014  4:49 PM Admission Comments: No comment available   Primary Diagnosis:  Intertrochanteric fracture of right femur Principal Problem: Intertrochanteric fracture of right femur    Patient Active Problem List     Diagnosis  Date Noted   .  Leukocytosis  09/24/2014   .  Intertrochanteric fracture of right femur  09/24/2014   .  Hip fracture  09/23/2014   .  History of stroke  09/23/2014   .  HLD (hyperlipidemia)  09/23/2014   .  Fall     .  Encounter for therapeutic drug monitoring  09/01/2013   .  Long term current use of anticoagulant therapy  11/14/2010   .  Congestive heart failure  04/18/2010   .  EDEMA  10/12/2008   .  Elevated lipids  10/08/2008   .  Essential hypertension  10/08/2008   .  Atrial fibrillation  10/08/2008   .  CVA  10/08/2008   .  Transient cerebral ischemia  10/08/2008     Expected Discharge Date: Expected Discharge Date: 10/05/14  Team Members Present: Physician leading conference: Dr. Alger Simons Social Worker Present: Ovidio Kin, LCSW Nurse Present: Elliot Cousin, RN PT Present: Raylene Everts, PT;Emily Rinaldo Cloud, PT OT Present: Benay Pillow, OT PPS Coordinator present : Daiva Nakayama, RN, CRRN        Current Status/Progress  Goal  Weekly Team Focus   Medical     left hip fx. anemia, uti  improve pain control and activity tolerance   bp mgt, anemia rx   Bowel/Bladder     Continent to bowel and bladder.Wears depends.   To  continue continent to bowel and bladder.   To monitor bladder and bowel function Q. shift.    Swallow/Nutrition/ Hydration       na         ADL's     min -mod A for self care, mod A toilet transfer, min A for toileting, decreased activity tolerance  Mod 1- supervision overall  self care retraining, functional mobility, functional transfers, pt/family education, activity tolerance   Mobility     min A overall for bed mobility, transfers, gait  mod I overall  activity tolerance, gait, therex, transfers, bed mobility, ptfamily education   Communication       na         Safety/Cognition/ Behavioral Observations       To keep pt. free from fall during his stay in rehab.       Pain     Intermitten pain on right leg.Uses 1 to 2 Vicodin PRN.  To keep pain levels less than 3,on scale 1 to 10.  To monitor pain levels Q 2 hrs. and PRN.   Skin     Incision on right leg dry and intact.   To keep skin free of pressure ulcers.   To monitor skin  Q. shift.      *See Care Plan and progress notes for long and short-term goals.    Barriers to Discharge:  anemia, pain, ortho precautions     Possible Resolutions to Barriers:   pain mgt, blood txfusion, adaptive equipment      Discharge Planning/Teaching Needs:   Home with wife who can provide light level of assist        Team Discussion:    Hemoglobin low-plans to give blood transfusion today.  Treating for UTI. Pt not feeling well and tired in therapies. Goals set at mod/i level.  Hope once feeling better will do well here.   Revisions to Treatment Plan:    None    Continued Need for Acute Rehabilitation Level of Care: The patient requires daily medical management by a physician with specialized training in physical medicine and rehabilitation for the following conditions: Daily direction of a multidisciplinary physical rehabilitation program to ensure safe treatment while eliciting the highest outcome that is of practical value to the patient.:  Yes Daily medical management of patient stability for increased activity during participation in an intensive rehabilitation regime.: Yes Daily analysis of laboratory values and/or radiology reports with any subsequent need for medication adjustment of medical intervention for : Post surgical problems;Other  Aaron Frye 09/29/2014, 3:41 PM                  Patient ID: Aaron Frye, male   DOB: 12-20-1924, 81 y.o.   MRN: [id number]

## 2014-09-30 ENCOUNTER — Inpatient Hospital Stay (HOSPITAL_COMMUNITY): Payer: Medicare Other | Admitting: Occupational Therapy

## 2014-09-30 ENCOUNTER — Inpatient Hospital Stay (HOSPITAL_COMMUNITY): Payer: Medicare Other

## 2014-09-30 DIAGNOSIS — I482 Chronic atrial fibrillation: Secondary | ICD-10-CM

## 2014-09-30 DIAGNOSIS — D62 Acute posthemorrhagic anemia: Secondary | ICD-10-CM

## 2014-09-30 LAB — CBC
HCT: 30.4 % — ABNORMAL LOW (ref 39.0–52.0)
HEMOGLOBIN: 10 g/dL — AB (ref 13.0–17.0)
MCH: 26.3 pg (ref 26.0–34.0)
MCHC: 32.9 g/dL (ref 30.0–36.0)
MCV: 80 fL (ref 78.0–100.0)
Platelets: 266 10*3/uL (ref 150–400)
RBC: 3.8 MIL/uL — AB (ref 4.22–5.81)
RDW: 15.9 % — ABNORMAL HIGH (ref 11.5–15.5)
WBC: 16.3 10*3/uL — ABNORMAL HIGH (ref 4.0–10.5)

## 2014-09-30 LAB — TYPE AND SCREEN
ABO/RH(D): O POS
ANTIBODY SCREEN: NEGATIVE
Unit division: 0
Unit division: 0

## 2014-09-30 LAB — PROTIME-INR
INR: 1.52 — AB (ref 0.00–1.49)
Prothrombin Time: 18.4 seconds — ABNORMAL HIGH (ref 11.6–15.2)

## 2014-09-30 LAB — HEPARIN LEVEL (UNFRACTIONATED): HEPARIN UNFRACTIONATED: 0.51 [IU]/mL (ref 0.30–0.70)

## 2014-09-30 MED ORDER — POTASSIUM CHLORIDE CRYS ER 20 MEQ PO TBCR
20.0000 meq | EXTENDED_RELEASE_TABLET | Freq: Once | ORAL | Status: AC
  Administered 2014-09-30: 20 meq via ORAL
  Filled 2014-09-30: qty 1

## 2014-09-30 MED ORDER — FUROSEMIDE 20 MG PO TABS
20.0000 mg | ORAL_TABLET | ORAL | Status: AC
  Administered 2014-09-30: 20 mg via ORAL
  Filled 2014-09-30: qty 1

## 2014-09-30 MED ORDER — WARFARIN SODIUM 5 MG PO TABS
5.0000 mg | ORAL_TABLET | Freq: Once | ORAL | Status: AC
  Administered 2014-09-30: 5 mg via ORAL
  Filled 2014-09-30: qty 1

## 2014-09-30 MED ORDER — METHOCARBAMOL 500 MG PO TABS
500.0000 mg | ORAL_TABLET | Freq: Four times a day (QID) | ORAL | Status: DC | PRN
Start: 2014-09-30 — End: 2014-10-08
  Administered 2014-09-30 – 2014-10-01 (×4): 500 mg via ORAL
  Filled 2014-09-30 (×4): qty 1

## 2014-09-30 MED ORDER — FUROSEMIDE 10 MG/ML IJ SOLN
40.0000 mg | Freq: Once | INTRAMUSCULAR | Status: AC
  Administered 2014-09-30: 40 mg via INTRAVENOUS
  Filled 2014-09-30: qty 4

## 2014-09-30 MED ORDER — LEVALBUTEROL HCL 0.63 MG/3ML IN NEBU
0.6300 mg | INHALATION_SOLUTION | Freq: Four times a day (QID) | RESPIRATORY_TRACT | Status: DC | PRN
  Administered 2014-10-02 – 2014-10-04 (×3): 0.63 mg via RESPIRATORY_TRACT
  Filled 2014-09-30 (×5): qty 3

## 2014-09-30 MED ORDER — TRAZODONE HCL 50 MG PO TABS
25.0000 mg | ORAL_TABLET | Freq: Every evening | ORAL | Status: DC | PRN
  Administered 2014-10-08: 25 mg via ORAL
  Filled 2014-09-30: qty 1

## 2014-09-30 NOTE — Progress Notes (Signed)
[facility] PHYSICAL MEDICINE & REHABILITATION     PROGRESS NOTE    Subjective/Complaints: A little short winded this am. Denies cough. No issues with blood transfusion last night  Objective: Vital Signs: Blood pressure 137/55, pulse 108, temperature 97.9 F (36.6 C), temperature source Oral, resp. rate 18, weight 80.2 kg (176 lb 12.9 oz), SpO2 96 %. No results found.  Recent Labs  09/28/14 0325 09/29/14 0600  WBC 11.5* 12.9*  HGB 7.9* 7.5*  HCT 24.3* 23.6*  PLT 176 216    Recent Labs  09/28/14 0325  NA 133*  K 3.7  CL 99  GLUCOSE 135*  BUN 27*  CREATININE 0.83  CALCIUM 7.8*   CBG (last 3)  No results for input(s): GLUCAP in the last 72 hours.  Wt Readings from Last 3 Encounters:  09/30/14 80.2 kg (176 lb 12.9 oz)  09/27/14 84.959 kg (187 lb 4.8 oz)  08/24/14 81.194 kg (179 lb)    Physical Exam:  Constitutional: He is oriented to person, place, and time. He appears well-developed.  HENT:  Head: Normocephalic and atraumatic.  Eyes: Conjunctivae and EOM are normal. Pupils are equal, round, and reactive to light.  Neck: Normal range of motion. Neck supple. Tracheal deviation present. No thyromegaly present.  Cardiovascular: Normal rate and regular rhythm.  No murmur heard. Cardiac rate controlled  Respiratory: Effort normal and breath sounds normal. Scattered rhonchi, a few wheezes GI: Soft. Bowel sounds are normal. He exhibits no distension.  Musculoskeletal: He exhibits edema.  Neurological: He is alert and oriented to person, place, and time.  Right leg 2/5 prox to 3+ distally. UE 5/5. LLE 4/5 hf,ke 5/5 ankle. No sensory deficits  Skin:  Hip incisions are dressed and limb is appropriately tender. Chronic vascular changes in the distal legs.  Psychiatric: He has a normal mood and affect. His behavior is normal  Assessment/Plan: 1. Functional deficits secondary to right intertrochanteric femur fracture/IMN which require 3+ hours per day of  interdisciplinary therapy in a comprehensive inpatient rehab setting. Physiatrist is providing close team supervision and 24 hour management of active medical problems listed below. Physiatrist and rehab team continue to assess barriers to discharge/monitor patient progress toward functional and medical goals. FIM:    FIM - Lower Body Dressing/Undressing Lower body dressing/undressing steps patient completed: Thread/unthread left pants leg, Don/Doff left sock Lower body dressing/undressing: 2: Max-Patient completed 25-49% of tasks  FIM - Toileting Toileting steps completed by patient: Adjust clothing prior to toileting, Performs perineal hygiene, Adjust clothing after toileting Toileting Assistive Devices: Grab bar or rail for support Toileting: 4: Steadying assist  FIM - Radio producer Devices: Elevated toilet seat, Grab bars Toilet Transfers: 3-To toilet/BSC: Mod A (lift or lower assist), 3-From toilet/BSC: Mod A (lift or lower assist)  FIM - Bed/Chair Transfer Bed/Chair Transfer: 3: Chair or W/C > Bed: Mod A (lift or lower assist), 3: Sit > Supine: Mod A (lifting assist/Pt. 50-74%/lift 2 legs)  FIM - Locomotion: Wheelchair Locomotion: Wheelchair: 2: Travels 50 - 149 ft with supervision, cueing or coaxing FIM - Locomotion: Ambulation Ambulation/Gait Assistance: 4: Min assist Locomotion: Ambulation: 2: Travels 50 - 149 ft with minimal assistance (Pt.>75%)  Comprehension Comprehension Mode: Auditory Comprehension: 6-Follows complex conversation/direction: With extra time/assistive device  Expression Expression Mode: Verbal Expression: 6-Expresses complex ideas: With extra time/assistive device  Social Interaction Social Interaction: 6-Interacts appropriately with others with medication or extra time (anti-anxiety, antidepressant).  Problem Solving Problem Solving: 5-Solves basic 90% of the time/requires cueing <  10% of the time  Memory Memory:  5-Recognizes or recalls 90% of the time/requires cueing < 10% of the time  Medical Problem List and Plan: 1. Functional deficits secondary to right intertrochanteric femur fracture. Status post IM nailing 09/24/2014. Weightbearing as tolerated 2. DVT Prophylaxis/Anticoagulation: Chronic Coumadin therapy for atrial fibrillation. Intravenous heparin until INR therapeutic. Monitor for any bleeding episodes 3. Pain Management: Hydrocodone. Monitor with increased mobility 4. Acute blood loss anemia. hgb 7.5 yesterday---symptomatic with therapy---blood transfusion 2u last night  - follow up cbc 5. Neuropsych: This patient is capable of making decisions on his own behalf. 6. Skin/Wound Care: Routine skin checks 7. Fluids/Electrolytes/Nutrition: encourage increased PO intake. 8. Hypertension. Altace 5 mg daily, Toprol-XL 50 mg daily. Monitor with increased mobility 9. Escherichia coli UTI. Vantin 200 mg twice a day initiated 09/25/2014 10. Hyperlipidemia. Lipitor 11. Loose stools---?related to abx  -stop colace  -probiotic 12. CV: ?mild fluid overload from blood transfusion---check cxr this morning. Give 20mg  lasix x 1  -xopenex breathing treatment LOS (Days) 3 A FACE TO FACE EVALUATION WAS PERFORMED  SWARTZ,ZACHARY T 09/30/2014 8:12 AM

## 2014-09-30 NOTE — Progress Notes (Signed)
Occupational Therapy Session Note  Patient Details  Name: Aaron Frye MRN: [id number] Date of Birth: 09/24/1924  Today's Date: 09/30/2014 OT Individual Time: 6834-1962 and 1500-1530 OT Individual Time Calculation (min): 53 min and 30 min 7 missed minutes secondary to pt taken for xray  Short Term Goals: Week 1:  OT Short Term Goal 1 (Week 1): STGs= LTGs secondary to short estimated LOS  Skilled Therapeutic Interventions/Progress Updates:   Session 1:Upon entering the room, pt seated in recliner chair and reporting, "I don't feel well today." Pt appearing to be wheezing during session and having SOB. O2 stats decreasing to 85% and required repetitive pursed lip breathing in order to raise to 90%. RN notified of patient concerns. Bathing at sink side mostly seated this session with standing to wash buttocks and peri area with min A for balance. Pt also standing while partially completing grooming tasks and seated for rest breaks as needed. Transport arrived to take pt for chest xray. Min A STS from wheelchair to ambulate with RW for stand pivot onto bed. Mod A for sit >supine with transport staff remaining to take pt from floor.   Session 2: Upon entering the room, pt seated in recliner chair with wife present in room. Pt reporting , "I am tired but feeling better." Pt ambulated from room ~ 100 feet to ADL apartment with RW and steady assist. Pt initially presenting with step to gait pattern. OT provided demonstration cues as well as verbal cues for larger alternating steps. Pt returning demonstration and ambulating with supervision - steady assist the rest of the way to apartment. Pt requiring short rest break and then standing in kitchen with RW and obtaining dishes from cabinet with close supervision and 1 hand supported as he reached out of base of support to obtain items. Pt then ambulating back to room in same manner as stated above. Pt ambulating to restroom and requiring side steps with RW  secondary to tight space with min A and verbal cues for proper technique. Toilet transfer with Min A and steady assist with clothing management and hygiene. Pt returning to sit in recliner chair with B LEs elevated and call bell within reach. Wife remains present in room.   Therapy Documentation Precautions:  Precautions Precautions: Fall Restrictions Weight Bearing Restrictions: Yes RLE Weight Bearing: Weight bearing as tolerated General: General OT Amount of Missed Time: 7 Minutes Vital Signs:  Pain: Pain Assessment Pain Assessment: 0-10 Pain Score: 2  Pain Type: Acute pain Pain Location: Hip Pain Orientation: Right Pain Descriptors / Indicators: Aching Pain Frequency: Intermittent Pain Onset: On-going Patients Stated Pain Goal: 2 Pain Intervention(s): Medication (See eMAR) Multiple Pain Sites: No  See FIM for current functional status  Therapy/Group: Individual Therapy  Phineas Semen 09/30/2014, 12:21 PM

## 2014-09-30 NOTE — Progress Notes (Signed)
Physical Therapy Session Note  Patient Details  Name: Aaron Frye MRN: [id number] Date of Birth: 01-Nov-1924  Today's Date: 09/30/2014 PT Individual Time: 10:30 - 11:07 and 1300-1355 PT Individual Time Calculation (min): 37 min and 55 min   Short Term Goals: Week 1:  PT Short Term Goal 1 (Week 1): = LTG due ELOS  Skilled Therapeutic Interventions/Progress Updates:  Session 1: Patient on toilet upon entering room. Patient performed toileting tasks (pants up/down and hygiene) with steadying assist. Patient ambulated to sink to wash hands. Patient pushed in wheelchair to gym due to time. Patient transferred wheelchair <> Nustep with RW and min steady assist. Patient exercised on Nustep x 6 minutes at level 4 for general ROM and strengthening of right LE. Patient transferred wheelchair to recliner with min steady assist and was left in recliner with all items in reach.   Session 2: Patient on toilet upon entering room. Patient performed toileting tasks (pants up/down and hygiene) with steadying assist. Patient ambulated to sink to wash hands. Patient pushed in wheelchair to gym due to time. Patient transferred wheelchair <> mat with RW and close supervision. Patient reintroduced to leg lifter and required min assist for sit to supine lift right LE on mat even with using lifter. Patient performed active LE exercise on left and active assistive exercise on right x 10 reps each of: heel slides, hip abduction, ankle pumps, SAQ's, and SLR. Patient supine to sit with cueing/supervision and use of leg lifter to assist right LE off of mat. Patient sit to stand and ambulated 150 feet back to room with close supervision/occasional steady assist using RW. Patient needed cueing to increase trunk extension/stand tall during ambulation.  Patient left in recliner with all items in reach.     Therapy Documentation Precautions:  Precautions Precautions: Fall Restrictions Weight Bearing Restrictions: Yes RLE  Weight Bearing: Weight bearing as tolerated  Pain: Pain Assessment Pain Assessment: 0-10 Pain Score: 4  Pain Type: Surgical pain Pain Location: Hip Pain Orientation: Right  Locomotion : Ambulation Ambulation/Gait Assistance: 4: Min guard   See FIM for current functional status  Therapy/Group: Individual Therapy  Sanjuana Letters 09/30/2014, 3:37 PM

## 2014-09-30 NOTE — Progress Notes (Addendum)
ANTICOAGULATION CONSULT NOTE - Follow Up Consult  Pharmacy Consult for coumadin and heparin Indication: atrial fibrillation  Allergies  Allergen Reactions  . Penicillins     REACTION: unspecified    Patient Measurements: Weight: 176 lb 12.9 oz (80.2 kg) Heparin Dosing Weight:   Vital Signs: Temp: 97.9 F (36.6 C) (03/31 0530) Temp Source: Oral (03/31 0530) BP: 137/55 mmHg (03/31 0530) Pulse Rate: 108 (03/31 0530)  Labs:  Recent Labs  09/28/14 0325 09/28/14 1500 09/28/14 2037 09/29/14 0500 09/29/14 0600  HGB 7.9*  --   --   --  7.5*  HCT 24.3*  --   --   --  23.6*  PLT 176  --   --   --  216  LABPROT 17.1*  --   --   --  18.6*  INR 1.38  --   --   --  1.53*  HEPARINUNFRC 0.28* 0.64 0.68 0.49  --   CREATININE 0.83  --   --   --   --     Estimated Creatinine Clearance: 63.6 mL/min (by C-G formula based on Cr of 0.83).   Medications:  Scheduled:  . sodium chloride   Intravenous Once  . acetaminophen  650 mg Oral Once  . aspirin  81 mg Oral Daily  . atorvastatin  40 mg Oral Daily  . cefpodoxime  200 mg Oral Q12H  . ferrous sulfate  325 mg Oral BID WC  . fluticasone  1 spray Each Nare Daily  . furosemide  20 mg Oral NOW  . loratadine  10 mg Oral Daily  . metoprolol succinate  50 mg Oral Q breakfast  . ramipril  5 mg Oral Daily  . saccharomyces boulardii  250 mg Oral BID  . senna  2 tablet Oral QHS  . Warfarin - Pharmacist Dosing Inpatient   Does not apply q1800   Infusions:  . heparin 1,500 Units/hr (09/30/14 0246)    Assessment: 81 yo male with hx of afib is currently on subtherapeutic coumadin bridging with therapeutic heparin.  INR and heparin level today are 1.52 (missed dose few days ago) and 0.51, respectively.  Goal of Therapy:  Heparin level 0.3-0.7 units/ml: INR 2-3 Monitor platelets by anticoagulation protocol: Yes   Plan:  - continue heparin @ 1500 units/hr and coumadin 5 mg po x1 - Daily heparin level, CBC and INR  Jamear Carbonneau,  Tsz-Yin 09/30/2014,8:39 AM

## 2014-09-30 NOTE — Progress Notes (Signed)
Social Work Patient ID: Aaron Frye, male   DOB: [birth year], 81 y.o.   MRN: [id number] Met with pt and wife who was here, he is feeling better today since got blood and being treated for UTI. He did have some chest pain this am, but given lasix and he is doing better now. Wife did watch him in his OT session and will plan to come back for PT.  Daughter is coming back this weekend.  Work on discharge for Tuesday.

## 2014-10-01 ENCOUNTER — Inpatient Hospital Stay (HOSPITAL_COMMUNITY): Payer: Medicare Other | Admitting: Occupational Therapy

## 2014-10-01 ENCOUNTER — Inpatient Hospital Stay (HOSPITAL_COMMUNITY): Payer: Medicare Other | Admitting: Rehabilitation

## 2014-10-01 ENCOUNTER — Inpatient Hospital Stay (HOSPITAL_COMMUNITY): Payer: Medicare Other

## 2014-10-01 DIAGNOSIS — I5030 Unspecified diastolic (congestive) heart failure: Secondary | ICD-10-CM

## 2014-10-01 LAB — BASIC METABOLIC PANEL
Anion gap: 7 (ref 5–15)
BUN: 28 mg/dL — AB (ref 6–23)
CO2: 25 mmol/L (ref 19–32)
Calcium: 8.1 mg/dL — ABNORMAL LOW (ref 8.4–10.5)
Chloride: 106 mmol/L (ref 96–112)
Creatinine, Ser: 0.69 mg/dL (ref 0.50–1.35)
GFR calc Af Amer: >90 mL/min (ref >90)
GFR calc non Af Amer: 83 mL/min — ABNORMAL LOW (ref >90)
GLUCOSE: 139 mg/dL — AB (ref 70–99)
Potassium: 5.1 mmol/L (ref 3.5–5.1)
SODIUM: 138 mmol/L (ref 135–145)

## 2014-10-01 LAB — CBC
HCT: 26.4 % — ABNORMAL LOW (ref 39.0–52.0)
HEMOGLOBIN: 8.6 g/dL — AB (ref 13.0–17.0)
MCH: 26.1 pg (ref 26.0–34.0)
MCHC: 32.6 g/dL (ref 30.0–36.0)
MCV: 80.2 fL (ref 78.0–100.0)
Platelets: 253 10*3/uL (ref 150–400)
RBC: 3.29 MIL/uL — ABNORMAL LOW (ref 4.22–5.81)
RDW: 16.7 % — ABNORMAL HIGH (ref 11.5–15.5)
WBC: 17.1 10*3/uL — AB (ref 4.0–10.5)

## 2014-10-01 LAB — HEPARIN LEVEL (UNFRACTIONATED): HEPARIN UNFRACTIONATED: 0.67 [IU]/mL (ref 0.30–0.70)

## 2014-10-01 LAB — PROTIME-INR
INR: 1.66 — ABNORMAL HIGH (ref 0.00–1.49)
PROTHROMBIN TIME: 19.8 s — AB (ref 11.6–15.2)

## 2014-10-01 MED ORDER — WARFARIN SODIUM 5 MG PO TABS
5.0000 mg | ORAL_TABLET | Freq: Once | ORAL | Status: AC
  Administered 2014-10-01: 5 mg via ORAL
  Filled 2014-10-01 (×2): qty 1

## 2014-10-01 MED ORDER — MUSCLE RUB 10-15 % EX CREA
TOPICAL_CREAM | CUTANEOUS | Status: DC | PRN
  Administered 2014-10-01 – 2014-10-02 (×3): via TOPICAL
  Filled 2014-10-01 (×2): qty 85

## 2014-10-01 NOTE — Progress Notes (Signed)
Per report patient with c/o (L) flank pain, and discomfort through the night. This am noted bruising to patient (L) flank with soreness and discomfort. Noted order for ice pack, while applying noted (L) chest protruding and c/o discomfort, and soreness. Notified Algis Liming, PA she assessed area and gave verbal order to stop Heparin drip. Transporter took patient for chest x-ray. Notified Pharmacist heparin had been stopped. Will continue to monitor patient.

## 2014-10-01 NOTE — Progress Notes (Signed)
[facility] PHYSICAL MEDICINE & REHABILITATION     PROGRESS NOTE    Subjective/Complaints: Feels better overall this morning. Having some discomfort along his left axilla into chest.   Objective: Vital Signs: Blood pressure 155/62, pulse 104, temperature 98.8 F (37.1 C), temperature source Oral, resp. rate 18, weight 77.6 kg (171 lb 1.2 oz), SpO2 96 %. Dg Chest 2 View  09/30/2014   CLINICAL DATA:  Shortness of breath.  Confusion.  EXAM: CHEST  2 VIEW  COMPARISON:  None.  FINDINGS: Mediastinum and hilar structures normal. Cardiomegaly with pulmonary vascular prominence and interstitial prominence. Nodular opacities noted bilaterally, these may represent prominent vessels on end. Follow-up chest x-ray suggested. No pleural effusion or pneumothorax. No acute bony abnormality. Degenerative changes thoracic spine.  IMPRESSION: 1. Cardiomegaly with bilateral pulmonary interstitial prominence. Findings consistent with mild congestive heart failure .  2. Small nodular opacities noted throughout both lungs, these may represent vessels on end. Follow-up chest x-rays are recommended to demonstrate clearing of congestive heart failure and to exclude persistent pulmonary nodules.   Electronically Signed   By: Marcello Moores  Register   On: 09/30/2014 09:33    Recent Labs  09/30/14 1001 10/01/14 0630  WBC 16.3* 17.1*  HGB 10.0* 8.6*  HCT 30.4* 26.4*  PLT 266 253   No results for input(s): NA, K, CL, GLUCOSE, BUN, CREATININE, CALCIUM in the last 72 hours.  Invalid input(s): CO CBG (last 3)  No results for input(s): GLUCAP in the last 72 hours.  Wt Readings from Last 3 Encounters:  10/01/14 77.6 kg (171 lb 1.2 oz)  09/27/14 84.959 kg (187 lb 4.8 oz)  08/24/14 81.194 kg (179 lb)    Physical Exam:  Constitutional: He is oriented to person, place, and time. He appears well-developed.  HENT:  Head: Normocephalic and atraumatic.  Eyes: Conjunctivae and EOM are normal. Pupils are equal, round, and  reactive to light.  Neck: Normal range of motion. Neck supple. . No thyromegaly present.  Cardiovascular: Normal rate and regular rhythm.  No murmur heard. Cardiac rate controlled  Respiratory: Effort normal and breath sounds normal. Scattered rhonchi, a few wheezes GI: Soft. Bowel sounds are normal. He exhibits no distension.  Musculoskeletal: He exhibits edema. Tender along left pec major tendon Neurological: He is alert and oriented to person, place, and time.  Right leg 2/5 prox to 3+ distally. UE 5/5. LLE 4/5 hf,ke 5/5 ankle. No sensory deficits  Skin: bruising along left upper ribs to nearly axilla. Hip incisions are dressed and limb is appropriately tender. Chronic vascular changes in the distal legs.  Psychiatric: He has a normal mood and affect. His behavior is normal  Assessment/Plan: 1. Functional deficits secondary to right intertrochanteric femur fracture/IMN which require 3+ hours per day of interdisciplinary therapy in a comprehensive inpatient rehab setting. Physiatrist is providing close team supervision and 24 hour management of active medical problems listed below. Physiatrist and rehab team continue to assess barriers to discharge/monitor patient progress toward functional and medical goals. FIM: FIM - Bathing Bathing Steps Patient Completed: Chest, Right Arm, Left Arm, Abdomen, Front perineal area, Buttocks, Right upper leg, Left upper leg Bathing: 4: Min-Patient completes 8-9 13f 10 parts or 75+ percent  FIM - Upper Body Dressing/Undressing Upper body dressing/undressing steps patient completed: Thread/unthread right sleeve of pullover shirt/dresss, Thread/unthread left sleeve of pullover shirt/dress, Put head through opening of pull over shirt/dress, Pull shirt over trunk Upper body dressing/undressing: 5: Set-up assist to: Obtain clothing/put away FIM - Lower Body Dressing/Undressing  Lower body dressing/undressing steps patient completed: Thread/unthread left  pants leg, Pull pants up/down Lower body dressing/undressing: 2: Max-Patient completed 25-49% of tasks  FIM - Toileting Toileting steps completed by patient: Adjust clothing prior to toileting, Performs perineal hygiene, Adjust clothing after toileting Toileting Assistive Devices: Grab bar or rail for support Toileting: 4: Steadying assist  FIM - Radio producer Devices: Grab bars, Insurance account manager Transfers: 4-To toilet/BSC: Min A (steadying Pt. > 75%), 4-From toilet/BSC: Min A (steadying Pt. > 75%)  FIM - Bed/Chair Transfer Bed/Chair Transfer Assistive Devices: Copy: 5: Supine > Sit: Supervision (verbal cues/safety issues), 4: Sit > Supine: Min A (steadying pt. > 75%/lift 1 leg), 4: Bed > Chair or W/C: Min A (steadying Pt. > 75%), 4: Chair or W/C > Bed: Min A (steadying Pt. > 75%)  FIM - Locomotion: Wheelchair Locomotion: Wheelchair: 2: Travels 69 - 149 ft with supervision, cueing or coaxing FIM - Locomotion: Ambulation Locomotion: Ambulation Assistive Devices: Administrator Ambulation/Gait Assistance: 4: Min guard Locomotion: Ambulation: 4: Travels 150 ft or more with minimal assistance (Pt.>75%)  Comprehension Comprehension Mode: Auditory Comprehension: 6-Follows complex conversation/direction: With extra time/assistive device  Expression Expression Mode: Verbal Expression: 6-Expresses complex ideas: With extra time/assistive device  Social Interaction Social Interaction: 6-Interacts appropriately with others with medication or extra time (anti-anxiety, antidepressant).  Problem Solving Problem Solving: 5-Solves basic 90% of the time/requires cueing < 10% of the time  Memory Memory: 5-Recognizes or recalls 90% of the time/requires cueing < 10% of the time  Medical Problem List and Plan: 1. Functional deficits secondary to right intertrochanteric femur fracture. Status post IM nailing 09/24/2014. Weightbearing as  tolerated 2. DVT Prophylaxis/Anticoagulation: Chronic Coumadin therapy for atrial fibrillation. Intravenous heparin until INR therapeutic. Bleeding bruising due to heparin/coumadin? 3. Pain Management: Hydrocodone. Monitor with increased mobility 4. Acute blood loss anemia. s/p-blood transfusion 2u Wednesday night  - follow up hgb 8.6 today (after being 10.0 yesterday)  -check stool for OB.   -serial CBC's 5. Neuropsych: This patient is capable of making decisions on his own behalf. 6. Skin/Wound Care: Routine skin checks 7. Fluids/Electrolytes/Nutrition: encourage increased PO intake. 8. Hypertension. Altace 5 mg daily, Toprol-XL 50 mg daily. Monitor with increased mobility 9. Escherichia coli UTI/ID. Vantin 200 mg twice a day initiated 09/25/2014---7 days  -persistent leukocytosis---  -low grade temp 10. Hyperlipidemia. Lipitor 11. Loose stools--resolved  -probiotic 12. CV:  Mild fluid overload---responded to lasix yesterday  -follow up cxr today  -xopenex breathing treatment prn LOS (Days) 4 A FACE TO FACE EVALUATION WAS PERFORMED  SWARTZ,ZACHARY T 10/01/2014 8:44 AM

## 2014-10-01 NOTE — Progress Notes (Signed)
Physical Therapy Make up Session Note  Patient Details  Name: KENECHUKWU ECKSTEIN MRN: [id number] Date of Birth: 20-Oct-1924  Today's Date: 10/01/2014 PT Individual Time: 0947 (make up session)-1125 PT Individual Time Calculation (min): 15 min   Short Term Goals: Week 1:  PT Short Term Goal 1 (Week 1): = LTG due ELOS  Skilled Therapeutic Interventions/Progress Updates:   Pt received lying in bed, agreeable to make up session for 15 mins.  Skilled session focused on supine therex for BLE strengthening and ROM.  Performed B LE exercises as follows x 10 reps each; ankle pumps, quad sets, glute sets, heel slides, SLR, and hip abd.  Pt tolerated well with active assist needed for SLR, heel slides and hip abd.  Cues for slower speed during exercise for longer contraction.  Pt left in bed with all needs in reach and bed alarm set.    Therapy Documentation Precautions:  Precautions Precautions: Fall Restrictions Weight Bearing Restrictions: Yes RLE Weight Bearing: Weight bearing as tolerated   Vital Signs: Therapy Vitals Pulse Rate: 96 BP: (!) 108/56 mmHg Pain: Pt with continued pain in R hip and L chest, ice packs applied to assist.    Locomotion : Ambulation Ambulation/Gait Assistance: 4: Min guard   See FIM for current functional status  Therapy/Group: Individual Therapy  Denice Bors 10/01/2014, 12:12 PM

## 2014-10-01 NOTE — Progress Notes (Signed)
Nursing Note: Pt continues to complain of  Pain w/o relief.A: pt medicated w a second Vicodin.wbb

## 2014-10-01 NOTE — Progress Notes (Signed)
Occupational Therapy Session Note  Patient Details  Name: Aaron Frye MRN: [id number] Date of Birth: 11/02/1924  Today's Date: 10/01/2014 OT Individual Time:0657-0757 and 1300-1400 OT Individual Time Calculation (min): 60 min and 60 min   Short Term Goals: Week 1:  OT Short Term Goal 1 (Week 1): STGs= LTGs secondary to short estimated LOS  Skilled Therapeutic Interventions/Progress Updates:  Session 1: Upon entering the room, pt supine in bed with c/o pain under L axilla. Upon further inspection very large bruise present in this area and pt unsure how it occurred. RN notified. Pt with extreme fatigue during session and required multiple rest breaks with self care tasks. Supine >sit with Mod A secondary to increased pain. Stand pivot from bed >wheelchair with Min A. Pt seated in wheelchair at sink for self care tasks. Pt reporting LB dressing and cleansing done prior to session. Pt ambulating with RW and OT managing IV with steady assist to toilet. Toilet transfer with steady assist and heavy use of grab bars. Steady assist for clothing management after voiding. OT placing BSC over toilet in order to elevate toilet seat to decrease dependence on grab bars which pt will not have at home. Pt seated in recliner chair upon exiting the room. Call bell and all needed items within reach upon exiting the room.    Session 2: Pt seated in recliner chair upon entering the room and continues to report feeling unwell. Pt agreeable to session. OT educated and demonstrated [place] and sock aide for LB clothing management. Pt returning demonstration with sock aide and then reacher to donn and doff underwear x 2 reps for practice. OT also discussed and educated pt on use of elevated toilet seat for toileting. OT assisting pt to toilet with steady assist to bathroom to void. Depend was wet and urine was dark orange in color . RN notified of this finding. Toilet transfer and toileting with steady assist and use  of RW for safety. Pt returning to recliner chair at end of session. Ice applied to L axilla and R hip. Call bell and all needed items within reach.   Therapy Documentation Precautions:  Precautions Precautions: Fall Restrictions Weight Bearing Restrictions: Yes RLE Weight Bearing: Weight bearing as tolerated Vital Signs: Therapy Vitals Temp: 98.5 F (36.9 C) Temp Source: Oral Pulse Rate: 86 BP: (!) 101/48 mmHg Patient Position (if appropriate): Lying Oxygen Therapy SpO2: 98 % O2 Device: Not Delivered  See FIM for current functional status  Therapy/Group: Individual Therapy  Phineas Semen 10/01/2014, 4:12 PM

## 2014-10-01 NOTE — Discharge Instructions (Addendum)
Inpatient Rehab Discharge Instructions  [place] Discharge date and time: No discharge date for patient encounter.   Activities/Precautions/ Functional Status: Activity: activity as tolerated Diet: regular diet Wound Care: keep wound clean and dry Functional status:  ___ No restrictions     ___ Walk up steps independently ___ 24/7 supervision/assistance   ___ Walk up steps with assistance ___ Intermittent supervision/assistance  ___ Bathe/dress independently ___ Walk with walker     ___ Bathe/dress with assistance ___ Walk Independently    ___ Shower independently _x__ Walk with assistance    ___ Shower with assistance ___ No alcohol     ___ Return to work/school ________  Special Instructions:  Home health nurse to check of INR 10/11/2014 while on chronic Coumadin for atrial fibrillation results to [facility] [phone] fax [place]:    Home Health:   PT, OT, [place] SKAJG:[phone] Date of last service:10/08/2014  Medical Equipment/Items [place]   My questions have been answered and I understand these instructions. I will adhere to these goals and the provided educational materials after my discharge from the hospital.  Patient/Caregiver Signature _______________________________ Date __________  Clinician Signature _______________________________________ Date __________  Please bring this form and your medication list with you to all your follow-up doctor's appointments.   Information on my medicine - Coumadin   (Warfarin)  This medication education was reviewed with me or my healthcare representative as part of my discharge preparation.    Why was Coumadin prescribed for you? Coumadin was prescribed for you because you have a blood clot or a medical condition that can cause an increased risk of forming blood clots.  Blood clots can cause serious health problems by blocking the flow of blood to the heart, lung, or brain. Coumadin can prevent harmful blood clots from forming. As a reminder your indication for Coumadin is:   Stroke Prevention Because Of Atrial Fibrillation  What test will check on my response to Coumadin? While on Coumadin (warfarin) you will need to have an INR test regularly to ensure that your dose is keeping you in the desired range. The INR (international normalized ratio) number is calculated from the result of the laboratory test called prothrombin time (PT).  If an INR APPOINTMENT HAS NOT ALREADY BEEN MADE FOR YOU please schedule an appointment to have this lab work done by your health care provider within 7 days. Your INR goal is usually a number between:  2 to 3 or your provider may give you a more narrow range like 2-2.5.  Ask your health care provider during an office visit what your goal INR is.  What  do you need to  know  About  COUMADIN? Take Coumadin (warfarin) exactly as prescribed by your healthcare provider about the same time each day.  DO NOT stop taking without talking to the doctor who prescribed the medication.  Stopping without other blood clot prevention medication to take the place of Coumadin may increase your risk of developing a new clot or stroke.  Get refills before you run out.  What do you do if you miss a dose? If you miss a dose, take it as soon as you remember on the same day then continue your regularly scheduled regimen the next day.  Do not take two doses of Coumadin at the same time.  Important Safety Information A possible side effect of Coumadin (  Warfarin) is an increased risk of bleeding. You should call your healthcare provider right away if you experience any of the following: ? Bleeding from an injury or your nose that does not stop. ? Unusual colored urine (red or dark brown) or unusual colored stools (red or black). ? Unusual bruising for unknown  reasons. ? A serious fall or if you hit your head (even if there is no bleeding).  Some foods or medicines interact with Coumadin (warfarin) and might alter your response to warfarin. To help avoid this: ? Eat a balanced diet, maintaining a consistent amount of Vitamin K. ? Notify your provider about major diet changes you plan to make. ? Avoid alcohol or limit your intake to 1 drink for women and 2 drinks for men per day. (1 drink is 5 oz. wine, 12 oz. beer, or 1.5 oz. liquor.)  Make sure that ANY health care provider who prescribes medication for you knows that you are taking Coumadin (warfarin).  Also make sure the healthcare provider who is monitoring your Coumadin knows when you have started a new medication including herbals and non-prescription products.  Coumadin (Warfarin)  Major Drug Interactions  Increased Warfarin Effect Decreased Warfarin Effect  Alcohol (large quantities) Antibiotics (esp. Septra/Bactrim, Flagyl, Cipro) Amiodarone (Cordarone) Aspirin (ASA) Cimetidine (Tagamet) Megestrol (Megace) NSAIDs (ibuprofen, naproxen, etc.) Piroxicam (Feldene) Propafenone (Rythmol SR) Propranolol (Inderal) Isoniazid (INH) Posaconazole (Noxafil) Barbiturates (Phenobarbital) Carbamazepine (Tegretol) Chlordiazepoxide (Librium) Cholestyramine (Questran) Griseofulvin Oral Contraceptives Rifampin Sucralfate (Carafate) Vitamin K   Coumadin (Warfarin) Major Herbal Interactions  Increased Warfarin Effect Decreased Warfarin Effect  Garlic Ginseng Ginkgo biloba Coenzyme Q10 Green tea St. Johns wort    Coumadin (Warfarin) FOOD Interactions  Eat a consistent number of servings per week of foods HIGH in Vitamin K (1 serving =  cup)  Collards (cooked, or boiled & drained) Kale (cooked, or boiled & drained) Mustard greens (cooked, or boiled & drained) Parsley *serving size only =  cup Spinach (cooked, or boiled & drained) Swiss chard (cooked, or boiled & drained) Turnip  greens (cooked, or boiled & drained)  Eat a consistent number of servings per week of foods MEDIUM-HIGH in Vitamin K (1 serving = 1 cup)  Asparagus (cooked, or boiled & drained) Broccoli (cooked, boiled & drained, or raw & chopped) Brussel sprouts (cooked, or boiled & drained) *serving size only =  cup Lettuce, raw (green leaf, endive, romaine) Spinach, raw Turnip greens, raw & chopped   These websites have more information on Coumadin (warfarin):  FailFactory.se; VeganReport.com.au;

## 2014-10-01 NOTE — Progress Notes (Signed)
Aaron Liming, PA given verbal order to restart heparin, after reviewing chest x-ray. Notified Pharmacist that heparin has been restarted. Replaced ice packs to patient (L) flank, and (R) hip. Will continue to monitor patient.

## 2014-10-01 NOTE — Progress Notes (Signed)
Occupational therapist noted that patient's urine in brief was orange in color. Notified Pam Love, PA given order to collect UA, and to also collect 3 separate urine samples time and date. Condom catheter placed to collect urine samples. Patient states still having discomfort and pain to (L) chest, ice packs in place. Will continue to monitor patient.

## 2014-10-01 NOTE — Progress Notes (Signed)
ANTICOAGULATION CONSULT NOTE - Follow Up Consult  Pharmacy Consult for coumadin and heparin Indication: atrial fibrillation  Allergies  Allergen Reactions  . Penicillins     REACTION: unspecified    Patient Measurements: Weight: 171 lb 1.2 oz (77.6 kg) Heparin Dosing Weight:   Vital Signs: Temp: 98.8 F (37.1 C) (04/01 0455) Temp Source: Oral (04/01 0455) BP: 155/62 mmHg (04/01 0455) Pulse Rate: 104 (04/01 0455)  Labs:  Recent Labs  09/29/14 0500  09/29/14 0600 09/30/14 1001 10/01/14 0630  HGB  --   < > 7.5* 10.0* 8.6*  HCT  --   --  23.6* 30.4* 26.4*  PLT  --   --  216 266 253  LABPROT  --   --  18.6* 18.4* 19.8*  INR  --   --  1.53* 1.52* 1.66*  HEPARINUNFRC 0.49  --   --  0.51 0.67  < > = values in this interval not displayed.  Estimated Creatinine Clearance: 58.6 mL/min (by C-G formula based on Cr of 0.83).   Medications:  Scheduled:  . sodium chloride   Intravenous Once  . acetaminophen  650 mg Oral Once  . aspirin  81 mg Oral Daily  . atorvastatin  40 mg Oral Daily  . cefpodoxime  200 mg Oral Q12H  . ferrous sulfate  325 mg Oral BID WC  . fluticasone  1 spray Each Nare Daily  . loratadine  10 mg Oral Daily  . metoprolol succinate  50 mg Oral Q breakfast  . ramipril  5 mg Oral Daily  . saccharomyces boulardii  250 mg Oral BID  . senna  2 tablet Oral QHS  . Warfarin - Pharmacist Dosing Inpatient   Does not apply q1800   Infusions:  . heparin 1,500 Units/hr (09/30/14 1800)    Assessment: 80 yo male with hx of afib is currently on subtherapeutic coumadin bridging with therapeutic coumadin.  INR and heparin level are 1.66 and 0.67, respectively.  Hgb down a little to 8.6.  Plt stable.  Goal of Therapy:  Heparin level 0.3-0.7 units/ml; INR 2-3 Monitor platelets by anticoagulation protocol: Yes   Plan:  Continue Heparin 1500 units/hr Coumadin 5 mg today Watch Hgb  Zadok Holaway, Tsz-Yin 10/01/2014,8:33 AM

## 2014-10-01 NOTE — Progress Notes (Signed)
Physical Therapy Session Note  Patient Details  Name: Aaron Frye MRN: [id number] Date of Birth: 21-Sep-1924  Today's Date: 10/01/2014 PT Individual Time: 0830-0930 PT Individual Time Calculation (min): 60 min   Short Term Goals: Week 1:  PT Short Term Goal 1 (Week 1): = LTG due ELOS  Skilled Therapeutic Interventions/Progress Updates:   Pt received sitting in w/c, agreeable to therapy, however c/o increased pain in L side/rib region and noted large bruise with increased swelling in pectoral region, therefore RN made aware and she states that he has gotten pain meds prior to session earlier in am.  Skilled session focused on gait training with use of RW for improved quality and increased distance (activity tolerance), stair training to simulate home entry and seated nustep for overall strengthening, endurance, and increasing ROM in R hip.  Performed gait x 120' x 1 and another 90' x 1 with RW at mi/guard to close S with continued cues for upright posture and decreased antalgic gait pattern with increased L step length.  Performed seated nustep x 10 mins at level 4 resistance with BUE/LEs at closer position to pedals for increased ROM in R hip. Note that movement improved with continued motion during activity.  Performed stairs 4, 6" steps with L handrail in step to fashion with HHA on the R side due to only single rail at home. Pt requires min A, heavier min A when descending due to increased pain.  Had discussion with pt regarding who will be assisting into home and will wife be appropriate to assist.  He verbalized that she will likely not be able to assist as PT did during session.  Discussed adding R handrail so that he could utilize BUEs to ascend/descend sideways.  Pt states to speak with wife regarding matter, therefore will ask OT to address with wife during pm session.  Ambulated part of way back to room as above.  Pt transferred to recliner at min/guard assist level.  Left in recliner with  all needs in reach and ice pack applied to R hip for decreased pain and swelling.    Therapy Documentation Precautions:  Precautions Precautions: Fall Restrictions Weight Bearing Restrictions: Yes RLE Weight Bearing: Weight bearing as tolerated  Pain: Pt with increased pain in L side/rib area with large bruising and increased swelling noted in pectoral region.  RN made aware and pain meds given prior to session.    Locomotion : Ambulation Ambulation/Gait Assistance: 4: Min guard   See FIM for current functional status  Therapy/Group: Individual Therapy  Denice Bors 10/01/2014, 9:45 AM

## 2014-10-01 NOTE — Progress Notes (Signed)
Hematuria noted. WBC's elevated and hgb has decreased. Urine specimen being collected. Await specimen of stool for occult blood. Pt appears comfortable and says he's felt better today other than area along left axilla. Will monitor and treat supportively for now. Recheck all labs in the morning.   Meredith Staggers, MD, [place] [facility] 10/01/2014

## 2014-10-01 NOTE — Progress Notes (Signed)
Nursing Note: pt called c/o pain in his r inner ar area ,laterally, almost under armpit.Checked vitals,unremarakable.A: Pt requested pain  med and medicated w/ 1 hydrocodone.wbb

## 2014-10-01 NOTE — Plan of Care (Signed)
Problem: RH Toileting Goal: LTG Patient will perform toileting w/assist, cues/equip (OT) LTG: Patient will perform toiletiing (clothes management/hygiene) with assist, with/without cues using equipment (OT)  Downgraded secondary to patient progress towards goals  Problem: RH Toilet Transfers Goal: LTG Patient will perform toilet transfers w/assist (OT) LTG: Patient will perform toilet transfers with assist, with/without cues using equipment (OT)  Downgraded secondary to patient progress towards goals

## 2014-10-01 NOTE — Plan of Care (Signed)
Problem: RH Balance Goal: LTG Patient will maintain dynamic standing balance (PT) LTG: Patient will maintain dynamic standing balance with assistance during mobility activities (PT)  Downgraded due to slower progress than expected.   Problem: RH Bed Mobility Goal: LTG Patient will perform bed mobility with assist (PT) LTG: Patient will perform bed mobility with assistance, with/without cues (PT).  Downgraded due to slower progress than expected.   Problem: RH Bed to Chair Transfers Goal: LTG Patient will perform bed/chair transfers w/assist (PT) LTG: Patient will perform bed/chair transfers with assistance, with/without cues (PT).  Downgraded due to slower progress than expected.   Problem: RH Car Transfers Goal: LTG Patient will perform car transfers with assist (PT) LTG: Patient will perform car transfers with assistance (PT).  Downgraded due to slower progress than expected.   Problem: RH Ambulation Goal: LTG Patient will ambulate in controlled environment (PT) LTG: Patient will ambulate in a controlled environment, # of feet with assistance (PT).  Downgraded due to slower progress than expected.  Goal: LTG Patient will ambulate in home environment (PT) LTG: Patient will ambulate in home environment, # of feet with assistance (PT).  Downgraded due to slower progress than expected.

## 2014-10-02 ENCOUNTER — Inpatient Hospital Stay (HOSPITAL_COMMUNITY): Payer: Medicare Other | Admitting: Occupational Therapy

## 2014-10-02 ENCOUNTER — Inpatient Hospital Stay (HOSPITAL_COMMUNITY): Payer: Medicare Other | Admitting: *Deleted

## 2014-10-02 LAB — URINE MICROSCOPIC-ADD ON

## 2014-10-02 LAB — BASIC METABOLIC PANEL
ANION GAP: 4 — AB (ref 5–15)
BUN: 26 mg/dL — ABNORMAL HIGH (ref 6–23)
CO2: 27 mmol/L (ref 19–32)
CREATININE: 0.71 mg/dL (ref 0.50–1.35)
Calcium: 8.1 mg/dL — ABNORMAL LOW (ref 8.4–10.5)
Chloride: 104 mmol/L (ref 96–112)
GFR calc Af Amer: >90 mL/min (ref >90)
GFR calc non Af Amer: 82 mL/min — ABNORMAL LOW (ref >90)
Glucose, Bld: 157 mg/dL — ABNORMAL HIGH (ref 70–99)
POTASSIUM: 4.4 mmol/L (ref 3.5–5.1)
SODIUM: 135 mmol/L (ref 135–145)

## 2014-10-02 LAB — URINALYSIS, ROUTINE W REFLEX MICROSCOPIC
BILIRUBIN URINE: NEGATIVE
Glucose, UA: NEGATIVE mg/dL
Ketones, ur: NEGATIVE mg/dL
Nitrite: NEGATIVE
PH: 5 (ref 5.0–8.0)
Protein, ur: NEGATIVE mg/dL
SPECIFIC GRAVITY, URINE: 1.022 (ref 1.005–1.030)
UROBILINOGEN UA: 0.2 mg/dL (ref 0.0–1.0)

## 2014-10-02 LAB — CBC
HCT: 23 % — ABNORMAL LOW (ref 39.0–52.0)
Hemoglobin: 7.5 g/dL — ABNORMAL LOW (ref 13.0–17.0)
MCH: 26.2 pg (ref 26.0–34.0)
MCHC: 32.6 g/dL (ref 30.0–36.0)
MCV: 80.4 fL (ref 78.0–100.0)
Platelets: 313 10*3/uL (ref 150–400)
RBC: 2.86 MIL/uL — ABNORMAL LOW (ref 4.22–5.81)
RDW: 17.3 % — ABNORMAL HIGH (ref 11.5–15.5)
WBC: 19.9 10*3/uL — AB (ref 4.0–10.5)

## 2014-10-02 LAB — HEPARIN LEVEL (UNFRACTIONATED): HEPARIN UNFRACTIONATED: 0.42 [IU]/mL (ref 0.30–0.70)

## 2014-10-02 LAB — PROTIME-INR
INR: 1.74 — AB (ref 0.00–1.49)
PROTHROMBIN TIME: 20.5 s — AB (ref 11.6–15.2)

## 2014-10-02 MED ORDER — WARFARIN SODIUM 7.5 MG PO TABS
7.5000 mg | ORAL_TABLET | Freq: Once | ORAL | Status: AC
Start: 2014-10-02 — End: 2014-10-02
  Administered 2014-10-02: 7.5 mg via ORAL
  Filled 2014-10-02: qty 1

## 2014-10-02 NOTE — Progress Notes (Signed)
10/01/2014  2215  Dr Burnice Logan paged and returned call. Informed of Chest CT results performed tonight. Read impression of test and reason for doing due to left chest wall swelling and bruising noted on previous shift. Informed pt is on continuous heparin gtt attempting to bridge to po coumadin. Informed pt is currently comfortable with min chest wall discomfort and use of robaxin with mcl rub over site as need.  Dr states will see in am. KM

## 2014-10-02 NOTE — Progress Notes (Addendum)
Physical Therapy Session Note  Patient Details  Name: Aaron Frye MRN: [id number] Date of Birth: 1925-05-12  Today's Date: 10/02/2014 PT Individual Time: 1315-1415 PT Individual Time Calculation (min): 60 min   Short Term Goals: Week 1:  PT Short Term Goal 1 (Week 1): = LTG due ELOS  Skilled Therapeutic Interventions/Progress Updates:  Tx focused on functional mobility training, activity tolerance, and therex for strength and ROM. Pt continues to be limited by pain in L chest wall hematoma, nausea, fatigue, and general malaise. Pt/family concerned about decline in status, discussed with RN who believes it is related to low hemoglobin and plan to have transfusion tomorrow. Modified tx due to pt presentation and 7.5 hemoglobin. Pt unable to tolerate standing or walking today, so tx performed at bedside and supine.  Pt's HP elevated at rest, so transferred to bed, and sx's improved.   Pt had been up in recliner since the morning and not feeling well. Pt preformed reciprocal scooting with min A and cues for technique. Pt able to sit unsupported x3 min with cues for posture and breathing.   Pt requred multiple attempts for sit>stand, ultimately needing Max A for stand-pivot transfer for lifting and lowering, as well as assist turning. Pt moved sit>supine with Mod A for LE management, and assist needed to position in bed. LEs noted to be edematous, donned TED hose in bed.   Pt instructed in supine therex per general strengthening program handout 2x10 bil with cues for technique, rest breaks prn: ankle pumps, quad, sets, glute sets, SAQ, hip ABD, heel slides, and AAROM SLR.   Pt feeling much better in bed, left with call bell, bed alarm and all needs in reach.      Therapy Documentation Precautions:  Precautions Precautions: Fall Restrictions Weight Bearing Restrictions: Yes RLE Weight Bearing: Weight bearing as tolerated General:   Vital Signs: Therapy Vitals Pulse Rate: 90 BP: (!)  130/45 mmHg Patient Position (if appropriate): Lying Oxygen Therapy SpO2: 98 % O2 Device: Not Delivered Pain: Pain Assessment Pain Assessment: No/denies pain Pain Score: 3  Pain Location: Chest Pain Orientation: Anterior Pain Descriptors / Indicators: Aching Pain Intervention(s): RN made aware - provided pain meds  See FIM for current functional status  Therapy/Group: Individual Therapy  Kennieth Rad, PT, DPT  10/02/2014, 1:49 PM

## 2014-10-02 NOTE — Progress Notes (Signed)
Occupational Therapy Session Note  Patient Details  Name: Aaron Frye MRN: [id number] Date of Birth: Oct 18, 1924  Today's Date: 10/02/2014 OT Individual Time: 0900-0940 and 1430-1505 OT Individual Time Calculation (min): 40 min and 35 minutes 45 missed minutes total  Short Term Goals: Week 1:  OT Short Term Goal 1 (Week 1): STGs= LTGs secondary to short estimated LOS  Skilled Therapeutic Interventions/Progress Updates:  Session 1: Upon entering the room, pt reporting he does not feel well and L UE causing 8/10 pain with hematoma now present. Pt has daughter and son present in room. His son began yelling profanities to his sister causing therapist to alert security for safety. Son removed from room with pt and his daughter safe in room. Pt reporting he wishes to participate if he is able. Supine >sit with Mod A. Min A stand pivot bed >wheelchair with use of RW. Pt engaged in UB bathing and grooming tasks seated in wheelchair with multiple rest breaks. O2 remaining at 89- 91% during session. OT leading pt in pursed lip breathing to increase O2 stats when needed. Pt declining LB dressing and wishes to don hospital gown after bathing. Pt wishing to remain in wheelchair if he is able to tolerate but declines any further OT intervention at this time secondary to fatigue and pain. Pt seated in wheelchair with call bell and all needed items within reach upon exiting the room.   Session 2: Upon entering the room, pt supine in bed with 6/10 c/o pain in L UE and side. RN arriving to apply heat to this area for pain management. Resting heart rate of 94 BP and BP 104/51 in supine. Pt reporting he is not able to leave bed. OT provided pt with handouts regarding general energy conservation, energy conservation during self care, and during community tasks/IADLs. OT provided examples of many ways to utilize these techniques. Pt verbalizing understanding. Patient's wife and additional family members entering the  room. OT educated family on barriers towards progress this week. OT also discussed education with caregivers regarding energy conservation and pt progress towards goals. OT discussed pt upcoming discharge of 4/5 possibly being postponed secondary to illness decreasing patients participation in therapy services. Pt and caregiver verbalized understanding. OT tilting bed into trendelenburg with pt pushing through L LE and using bed rail with R UE for higher bed positioning. Bed alarm activated and therapist exiting the room.   Therapy Documentation Precautions:  Precautions Precautions: Fall Restrictions Weight Bearing Restrictions: Yes RLE Weight Bearing: Weight bearing as tolerated  See FIM for current functional status  Therapy/Group: Individual Therapy  Phineas Semen 10/02/2014, 12:25 PM

## 2014-10-02 NOTE — Progress Notes (Addendum)
ANTICOAGULATION CONSULT NOTE - Follow Up Consult  Pharmacy Consult for coumadin and heparin Indication: atrial fibrillation  Allergies  Allergen Reactions  . Penicillins     REACTION: unspecified    Patient Measurements: Weight: 172 lb 6.4 oz (78.2 kg)  Vital Signs: Temp: 98.8 F (37.1 C) (04/02 0559) Temp Source: Oral (04/02 0559) BP: 111/54 mmHg (04/02 0559) Pulse Rate: 82 (04/02 0559)  Labs:  Recent Labs  09/30/14 1001 10/01/14 0630 10/02/14 0646  HGB 10.0* 8.6* 7.5*  HCT 30.4* 26.4* 23.0*  PLT 266 253 313  LABPROT 18.4* 19.8* 20.5*  INR 1.52* 1.66* 1.74*  HEPARINUNFRC 0.51 0.67 0.42  CREATININE  --  0.69 0.71    Estimated Creatinine Clearance: 60.8 mL/min (by C-G formula based on Cr of 0.71).   Assessment: 81 yo male with hx of afib is currently on subtherapeutic coumadin with therapeutic heparin bridge. INR 1.74 - moving slowly on 5mg  daily.Hgb down to 7.5 - low but relatively stable, plt ok. No bleeding noted. (+)BM with some bleeding after, pt states (+)hemorrhoids. Pt also with hematuria 4/1. FOB pending. No dose 03/29 due to misreading of order.  Noted CT scan from 4/1 showing 7.5 x 8.5 x 13.2 cm acute intramuscular hematoma in the lateral left pectoralis muscle. Dr. Burnice Logan informed of chest CT results last night and no changes to anticoagulation. MD to make decision regarding anticoagulation today when he rounds.  Was on coumadin 5 mg daily except 2.5 mg on Tuesdays PTA  Goal of Therapy:  Heparin level 0.3-0.7 units/ml; INR 2-3 Monitor platelets by anticoagulation protocol: Yes   Plan:  Continue Heparin 1500 units/hr  Coumadin 7.5 mg today if MD continues Watch Hgb, FOB, hematuria F/u MD decision regarding continuing anticoagulation in pt with hematoma Coumadin educated  Sherlon Handing, PharmD, BCPS Clinical pharmacist, pager [phone] 10/02/2014,10:47 AM

## 2014-10-02 NOTE — Progress Notes (Signed)
Aaron Frye is a 81 y.o. male 03-04-1925 [id number]  Subjective: No new complaints. Remains hoarse - dtr requests neb for "congestion" in throat (as pt's wife uses same at home). Min discomfort today in L chest but remains swollen and tender to touch over hematoma  Objective: Vital signs in last 24 hours: Temp:  [98.5 F (36.9 C)-98.8 F (37.1 C)] 98.8 F (37.1 C) (04/02 0559) Pulse Rate:  [82-86] 82 (04/02 0559) Resp:  [18] 18 (04/02 0559) BP: (101-111)/(48-54) 111/54 mmHg (04/02 0559) SpO2:  [95 %-98 %] 95 % (04/02 0559) Weight:  [78.2 kg (172 lb 6.4 oz)] 78.2 kg (172 lb 6.4 oz) (04/02 0559) Weight change: 0.6 kg (1 lb 5.2 oz) Last BM Date: 09/30/14  Intake/Output from previous day: 04/01 0701 - 04/02 0700 In: 240 [P.O.:240] Out: 80 [Urine:80]  Physical Exam General: No apparent distress   Hoarse, sitting upright in BSR - Dtr at side Lungs: Normal effort. Lungs clear to auscultation with upper airway congestion, no wheezes. Cardiovascular: Regular rate and rhythm, no edema Wounds: firm L pect swelling due to hematoma, no abn warmth - evidence of bruising into L axilla and upper arm.  Lab Results: BMET    Component Value Date/Time   NA 135 10/02/2014 0646   K 4.4 10/02/2014 0646   CL 104 10/02/2014 0646   CO2 27 10/02/2014 0646   GLUCOSE 157* 10/02/2014 0646   BUN 26* 10/02/2014 0646   CREATININE 0.71 10/02/2014 0646   CALCIUM 8.1* 10/02/2014 0646   GFRNONAA 82* 10/02/2014 0646   GFRAA >90 10/02/2014 0646   CBC    Component Value Date/Time   WBC 19.9* 10/02/2014 0646   RBC 2.86* 10/02/2014 0646   RBC 3.44* 09/27/2014 0550   HGB 7.5* 10/02/2014 0646   HCT 23.0* 10/02/2014 0646   PLT 313 10/02/2014 0646   MCV 80.4 10/02/2014 0646   MCH 26.2 10/02/2014 0646   MCHC 32.6 10/02/2014 0646   RDW 17.3* 10/02/2014 0646   LYMPHSABS 1.1 09/23/2014 1930   MONOABS 1.1* 09/23/2014 1930   EOSABS 0.2 09/23/2014 1930   BASOSABS 0.1 09/23/2014 1930   CBG's (last  3):  No results for input(s): GLUCAP in the last 72 hours. LFT's Lab Results  Component Value Date   ALT 36 09/28/2014   AST 45* 09/28/2014   ALKPHOS 67 09/28/2014   BILITOT 1.0 09/28/2014    Studies/Results: Dg Chest 2 View  10/01/2014   CLINICAL DATA:  Pulmonary edema.  Bruising left lateral chest.  EXAM: CHEST  2 VIEW  COMPARISON:  09/30/2014  FINDINGS: Again noted are mildly prominent interstitial lung markings which have minimally changed. There is no focal airspace disease. Heart size is upper limits of normal. Atherosclerotic calcifications at the aortic arch. Difficult to exclude small effusions on the lateral view. Few densities overlying the left heart could be related to mitral annular calcifications but indeterminate. These were present on the previous examination.  IMPRESSION: Slightly prominent lung markings could represent mild edema. Minimal change from the previous examination.   Electronically Signed   By: Markus Daft M.D.   On: 10/01/2014 10:44   Ct Chest Wo Contrast  10/01/2014   CLINICAL DATA:  Left chest wall swelling  EXAM: CT CHEST WITHOUT CONTRAST  TECHNIQUE: Multidetector CT imaging of the chest was performed following the standard protocol without IV contrast.  COMPARISON:  None.  FINDINGS: Mediastinum/Nodes: Heart is top-normal in size. No pericardial effusion.  Hypodense blood pool relative to myocardium, reflecting a  anemia.  Coronary atherosclerosis.  Atherosclerotic calcifications of the aortic arch and mitral valve annulus.  Mildly prominent mediastinal lymph nodes, including a 12 mm short axis subcarinal node (series 2/image 28), likely reactive.  Visualized thyroid is grossly unremarkable.  Lungs/Pleura: Mild ground-glass opacity in the bilateral lungs is favored to reflect mild interstitial edema.  Small bilateral pleural effusions.  No suspicious pulmonary nodules.  No pneumothorax.  Upper abdomen: Visualized upper abdomen is notable for vascular calcifications, a 16  mm layering gallstone, and a 2.8 cm posterior right upper pole renal cyst.  Musculoskeletal: Degenerative changes of the visualized thoracolumbar spine.  7.5 x 8.5 x 13.2 cm acute intramuscular hematoma in the lateral left pectoralis muscle with layering hematocrit level (series 2/image 19).  Associated mild soft tissue stranding in the left chest wall.  IMPRESSION: 7.5 x 8.5 x 13.2 cm acute intramuscular hematoma in the lateral left pectoralis muscle.  Suspected mild interstitial edema with small bilateral pleural effusions.   Electronically Signed   By: Julian Hy M.D.   On: 10/01/2014 21:31    Medications:  I have reviewed the patient's current medications. Scheduled Medications: . sodium chloride   Intravenous Once  . acetaminophen  650 mg Oral Once  . aspirin  81 mg Oral Daily  . atorvastatin  40 mg Oral Daily  . cefpodoxime  200 mg Oral Q12H  . ferrous sulfate  325 mg Oral BID WC  . fluticasone  1 spray Each Nare Daily  . loratadine  10 mg Oral Daily  . metoprolol succinate  50 mg Oral Q breakfast  . ramipril  5 mg Oral Daily  . saccharomyces boulardii  250 mg Oral BID  . senna  2 tablet Oral QHS  . warfarin  7.5 mg Oral ONCE-1800  . Warfarin - Pharmacist Dosing Inpatient   Does not apply q1800   PRN Medications: acetaminophen **OR** acetaminophen, HYDROcodone-acetaminophen, levalbuterol, menthol-cetylpyridinium, methocarbamol, MUSCLE RUB, ondansetron **OR** ondansetron (ZOFRAN) IV, sorbitol, traZODone  Assessment/Plan: Principal Problem:   Intertrochanteric fracture of right femur Active Problems:   Essential hypertension   Atrial fibrillation   Congestive heart failure 1. Functional deficits secondary to right intertrochanteric femur fracture. Status post IM nailing 09/24/2014. Weightbearing as tolerated 2. DVT Prophylaxis/Anticoagulation: Chronic Coumadin therapy for atrial fibrillation. Intravenous heparin until INR therapeutic. Note hematoma complications on  heparin/coumadin - denies trauma 3. Pain Management: Hydrocodone. Monitor with increased mobility 4. Acute blood loss anemia. Related to intramuscular hematoma L pect as per CT 4/1. s/p-blood transfusion 2u prbc Wednesday 3/30 pm - follow up hgb 10 > 8.6 > 7.5 today; will transfuse another 2 U PRBC if <7 -check stool for OB.  -serial CBC's 5. Neuropsych: This patient is capable of making decisions on his own behalf. 6. Skin/Wound Care: Routine skin checks 7. Fluids/Electrolytes/Nutrition: encourage increased PO intake. 8. Hypertension. Altace 5 mg daily, Toprol-XL 50 mg daily. Monitor with increased mobility 9. Escherichia coli UTI/ID. Vantin 200 mg twice a day initiated 09/25/2014---7 days -persistent leukocytosis and low grade temp likely related to hematoma 10. Hyperlipidemia. Lipitor 11. Loose stools--resolved -probiotic 12. CV: Mild fluid overload---responded to lasix 3/30 -xopenex breathing treatment prn  Length of stay, days: 5   Valerie A. Asa Lente, MD 10/02/2014, 11:03 AM

## 2014-10-03 ENCOUNTER — Inpatient Hospital Stay (HOSPITAL_COMMUNITY): Payer: Medicare Other

## 2014-10-03 DIAGNOSIS — T148 Other injury of unspecified body region: Secondary | ICD-10-CM

## 2014-10-03 LAB — CBC
HCT: 20.6 % — ABNORMAL LOW (ref 39.0–52.0)
HEMOGLOBIN: 6.5 g/dL — AB (ref 13.0–17.0)
MCH: 26.1 pg (ref 26.0–34.0)
MCHC: 31.6 g/dL (ref 30.0–36.0)
MCV: 82.7 fL (ref 78.0–100.0)
PLATELETS: 342 10*3/uL (ref 150–400)
RBC: 2.49 MIL/uL — ABNORMAL LOW (ref 4.22–5.81)
RDW: 18.2 % — AB (ref 11.5–15.5)
WBC: 22 10*3/uL — ABNORMAL HIGH (ref 4.0–10.5)

## 2014-10-03 LAB — CBC WITH DIFFERENTIAL/PLATELET
BASOS PCT: 0 % (ref 0–1)
Basophils Absolute: 0.1 10*3/uL (ref 0.0–0.1)
EOS ABS: 0.2 10*3/uL (ref 0.0–0.7)
Eosinophils Relative: 1 % (ref 0–5)
HCT: 21.2 % — ABNORMAL LOW (ref 39.0–52.0)
Hemoglobin: 6.8 g/dL — CL (ref 13.0–17.0)
LYMPHS ABS: 1.9 10*3/uL (ref 0.7–4.0)
LYMPHS PCT: 9 % — AB (ref 12–46)
MCH: 26.4 pg (ref 26.0–34.0)
MCHC: 32.1 g/dL (ref 30.0–36.0)
MCV: 82.2 fL (ref 78.0–100.0)
Monocytes Absolute: 1.8 10*3/uL — ABNORMAL HIGH (ref 0.1–1.0)
Monocytes Relative: 8 % (ref 3–12)
NEUTROS ABS: 18.4 10*3/uL — AB (ref 1.7–7.7)
NEUTROS PCT: 82 % — AB (ref 43–77)
Platelets: 372 10*3/uL (ref 150–400)
RBC: 2.58 MIL/uL — ABNORMAL LOW (ref 4.22–5.81)
RDW: 18.5 % — ABNORMAL HIGH (ref 11.5–15.5)
WBC: 22.4 10*3/uL — ABNORMAL HIGH (ref 4.0–10.5)

## 2014-10-03 LAB — PREPARE RBC (CROSSMATCH)

## 2014-10-03 LAB — PROTIME-INR
INR: 2.13 — ABNORMAL HIGH (ref 0.00–1.49)
Prothrombin Time: 24 seconds — ABNORMAL HIGH (ref 11.6–15.2)

## 2014-10-03 MED ORDER — SODIUM CHLORIDE 0.9 % IV SOLN: Freq: Once | INTRAVENOUS | Status: DC

## 2014-10-03 NOTE — Progress Notes (Signed)
Aaron Frye is a 81 y.o. male [birth year] [id number]  Subjective: No new complaints. Remains hoarse. Breathing improved with use of neb. Denies any discomfort today in L chest but remains firm and swollen over hematoma  Objective: Vital signs in last 24 hours: Temp:  [97.7 F (36.5 C)-98.4 F (36.9 C)] 98.4 F (36.9 C) (04/03 0616) Pulse Rate:  [90-103] 101 (04/03 0616) Resp:  [16-18] 16 (04/03 0616) BP: (94-130)/(43-59) 121/53 mmHg (04/03 0616) SpO2:  [96 %-99 %] 98 % (04/03 0616) Weight:  [81.2 kg (179 lb 0.2 oz)] 81.2 kg (179 lb 0.2 oz) (04/03 0500) Weight change: 3 kg (6 lb 9.8 oz) Last BM Date: 09/30/14  Intake/Output from previous day: 04/02 0701 - 04/03 0700 In: 480 [P.O.:480] Out: 600 [Urine:600]  Physical Exam General: No apparent distress   Hoarse, sitting upright in BSR  Lungs: Normal effort. Lungs clear to auscultation without congestion, crackle or wheezes. Cardiovascular: Regular rate and rhythm, no edema Wounds: firm L pect swelling due to large anterior hematoma, no abn warmth - evidence of bruising into L axilla and upper arm.  Lab Results: BMET    Component Value Date/Time   NA 135 10/02/2014 0646   K 4.4 10/02/2014 0646   CL 104 10/02/2014 0646   CO2 27 10/02/2014 0646   GLUCOSE 157* 10/02/2014 0646   BUN 26* 10/02/2014 0646   CREATININE 0.71 10/02/2014 0646   CALCIUM 8.1* 10/02/2014 0646   GFRNONAA 82* 10/02/2014 0646   GFRAA >90 10/02/2014 0646   CBC    Component Value Date/Time   WBC 19.9* 10/02/2014 0646   RBC 2.86* 10/02/2014 0646   RBC 3.44* 09/27/2014 0550   HGB 7.5* 10/02/2014 0646   HCT 23.0* 10/02/2014 0646   PLT 313 10/02/2014 0646   MCV 80.4 10/02/2014 0646   MCH 26.2 10/02/2014 0646   MCHC 32.6 10/02/2014 0646   RDW 17.3* 10/02/2014 0646   LYMPHSABS 1.1 09/23/2014 1930   MONOABS 1.1* 09/23/2014 1930   EOSABS 0.2 09/23/2014 1930   BASOSABS 0.1 09/23/2014 1930   CBG's (last 3):  No results for input(s): GLUCAP in the  last 72 hours. LFT's Lab Results  Component Value Date   ALT 36 09/28/2014   AST 45* 09/28/2014   ALKPHOS 67 09/28/2014   BILITOT 1.0 09/28/2014    Studies/Results: Dg Chest 2 View  10/01/2014   CLINICAL DATA:  Pulmonary edema.  Bruising left lateral chest.  EXAM: CHEST  2 VIEW  COMPARISON:  09/30/2014  FINDINGS: Again noted are mildly prominent interstitial lung markings which have minimally changed. There is no focal airspace disease. Heart size is upper limits of normal. Atherosclerotic calcifications at the aortic arch. Difficult to exclude small effusions on the lateral view. Few densities overlying the left heart could be related to mitral annular calcifications but indeterminate. These were present on the previous examination.  IMPRESSION: Slightly prominent lung markings could represent mild edema. Minimal change from the previous examination.   Electronically Signed   By: Markus Daft M.D.   On: 10/01/2014 10:44   Ct Chest Wo Contrast  10/01/2014   CLINICAL DATA:  Left chest wall swelling  EXAM: CT CHEST WITHOUT CONTRAST  TECHNIQUE: Multidetector CT imaging of the chest was performed following the standard protocol without IV contrast.  COMPARISON:  None.  FINDINGS: Mediastinum/Nodes: Heart is top-normal in size. No pericardial effusion.  Hypodense blood pool relative to myocardium, reflecting a anemia.  Coronary atherosclerosis.  Atherosclerotic calcifications of the aortic arch and  mitral valve annulus.  Mildly prominent mediastinal lymph nodes, including a 12 mm short axis subcarinal node (series 2/image 28), likely reactive.  Visualized thyroid is grossly unremarkable.  Lungs/Pleura: Mild ground-glass opacity in the bilateral lungs is favored to reflect mild interstitial edema.  Small bilateral pleural effusions.  No suspicious pulmonary nodules.  No pneumothorax.  Upper abdomen: Visualized upper abdomen is notable for vascular calcifications, a 16 mm layering gallstone, and a 2.8 cm  posterior right upper pole renal cyst.  Musculoskeletal: Degenerative changes of the visualized thoracolumbar spine.  7.5 x 8.5 x 13.2 cm acute intramuscular hematoma in the lateral left pectoralis muscle with layering hematocrit level (series 2/image 19).  Associated mild soft tissue stranding in the left chest wall.  IMPRESSION: 7.5 x 8.5 x 13.2 cm acute intramuscular hematoma in the lateral left pectoralis muscle.  Suspected mild interstitial edema with small bilateral pleural effusions.   Electronically Signed   By: Julian Hy M.D.   On: 10/01/2014 21:31    Medications:  I have reviewed the patient's current medications. Scheduled Medications: . aspirin  81 mg Oral Daily  . atorvastatin  40 mg Oral Daily  . cefpodoxime  200 mg Oral Q12H  . ferrous sulfate  325 mg Oral BID WC  . fluticasone  1 spray Each Nare Daily  . loratadine  10 mg Oral Daily  . metoprolol succinate  50 mg Oral Q breakfast  . ramipril  5 mg Oral Daily  . saccharomyces boulardii  250 mg Oral BID  . senna  2 tablet Oral QHS  . Warfarin - Pharmacist Dosing Inpatient   Does not apply q1800   PRN Medications: acetaminophen **OR** acetaminophen, HYDROcodone-acetaminophen, levalbuterol, menthol-cetylpyridinium, methocarbamol, MUSCLE RUB, ondansetron **OR** ondansetron (ZOFRAN) IV, sorbitol, traZODone  Assessment/Plan: Principal Problem:   Intertrochanteric fracture of right femur Active Problems:   Essential hypertension   Atrial fibrillation   Congestive heart failure 1. Functional deficits secondary to right intertrochanteric femur fracture. Status post IM nailing 09/24/2014. Weightbearing as tolerated 2. DVT Prophylaxis/Anticoagulation: Chronic Coumadin therapy for atrial fibrillation. Stopped bridge of Intravenous heparin last PM due to concern for enlarging intramuscular hematoma. Ok to remain on coumadin - followup INR and CBC pending 3. Pain Management: Hydrocodone. Monitor with increased mobility 4.  Acute blood loss anemia. Related to intramuscular hematoma L pect as per CT 4/1. s/p-blood transfusion 2u prbc Wednesday 3/30 pm - follow up hgb 10 > 8.6 > 7.5 yesterday, today CBC still pending; will transfuse another 2 U PRBC if <7.5 -check stool for OB.  -serial CBC's 5. Neuropsych: This patient is capable of making decisions on his own behalf. 6. Skin/Wound Care: Routine skin checks 7. Fluids/Electrolytes/Nutrition: encourage increased PO intake. 8. Hypertension. Altace 5 mg daily, Toprol-XL 50 mg daily. Monitor with increased mobility 9. Escherichia coli UTI/ID. Vantin 200 mg twice a day initiated 09/25/2014---7 days -persistent leukocytosis and low grade temp likely related to hematoma 10. Hyperlipidemia. Lipitor 11. L pectoralis hematoma - denies preceding trauma. see CT 4/1 results and discussion above - now off IV heparin bridge since 4/2 pm and remains on coumadin for CAF, but sub theraputic INR at last check. Continue Kpad heat and observation. Afebrile  Length of stay, days: 6   Valerie A. Asa Lente, MD 10/03/2014, 9:40 AM

## 2014-10-03 NOTE — Progress Notes (Signed)
1450 Rapid Response notified regarding drop of Hbg (6.5) and ongoing hematoma development to left upper chest.  RN asked for team to follow patient for non-emergent monitoring.  RBC transfusion scheduled today, pending Type Cross and Screening.

## 2014-10-03 NOTE — Progress Notes (Signed)
Patient vitals reveal low grade temp this afternoon.  MD notified and gave "okay" to continue with transfusion.

## 2014-10-03 NOTE — Progress Notes (Addendum)
Family at bedside reports left chest hematoma "seems larger".  Spoke with rapid response RN. Paged Dr. Asa Lente R/T hematoma and possibly discontinuing IV heparin. Stopped heparin at 2219, per orders. Informed Pharmacist of above events. Patient complains of tenderness to area, but denies need for pain meds. Bruising observed to left axillary area down to left flank. Vitals placed in computer. Poor appetite. Will continue to monitor.Aaron Frye A    Marked outline of  hematoma with marker.Aaron Frye A

## 2014-10-03 NOTE — Progress Notes (Signed)
Patient continues to need intermittent bladder monitoring.  Scanned this afternoon for 428 mL and catheterized for 600 mL.  (see doc flowsheet for details)

## 2014-10-03 NOTE — Progress Notes (Signed)
Physical Therapy Note  Patient Details  Name: Aaron Frye MRN: [id number] Date of Birth: 1924/12/17 Today's Date: 10/03/2014    Attempted to see pt for treatment this date. Pt continues to have trouble with significant fatigue and general malaise. Pt scheduled for blood transfusion this PM per RN. Pt missed 30 minutes of scheduled PT due to transfusion   Rada Hay 10/03/2014, 1:08 PM

## 2014-10-03 NOTE — Progress Notes (Signed)
CRITICAL VALUE ALERT  Critical value received:  6.5 hgb  Date of notification: 10/03/2014  Time of notification:  11:15  Critical value read back:Yes.    Nurse who received alert:  Tomma Rakers  MD notified (1st page):  Burnice Logan MD  Time of first page:  11:25  MD notified (2nd page):  Time of second page:  Responding MD:  Rosie Fate MD  Time MD responded:  11:30

## 2014-10-03 NOTE — Progress Notes (Signed)
Called by primary Rn for a "second set of eyes", to evaluate hematoma on chest.  Spoke with family and patient, left chest hematoma does not appear to be any bigger than previous noted, it is firm and tight.  He has bruising done left axillary.  Patient has heat pack on left upper chest and family feels this is helping.  Patient states it is does not really hurt but is more uncomfortable  Than anything.  Spoke with Colletta Maryland, RN advised to go ahead with blood transfusion and to keep monitoring hematoma.  Rapid Response will continue to follow patient, Rn to call if assistance needed

## 2014-10-03 NOTE — Progress Notes (Signed)
Rested quietly rest of night without complaint of . Aaron Frye A

## 2014-10-04 ENCOUNTER — Inpatient Hospital Stay (HOSPITAL_COMMUNITY): Payer: Medicare Other | Admitting: Rehabilitation

## 2014-10-04 ENCOUNTER — Inpatient Hospital Stay (HOSPITAL_COMMUNITY): Payer: Medicare Other

## 2014-10-04 ENCOUNTER — Encounter (HOSPITAL_COMMUNITY): Payer: Self-pay | Admitting: Cardiovascular Disease

## 2014-10-04 ENCOUNTER — Inpatient Hospital Stay (HOSPITAL_COMMUNITY): Payer: Medicare Other | Admitting: Occupational Therapy

## 2014-10-04 DIAGNOSIS — I509 Heart failure, unspecified: Secondary | ICD-10-CM

## 2014-10-04 DIAGNOSIS — I429 Cardiomyopathy, unspecified: Secondary | ICD-10-CM

## 2014-10-04 LAB — CBC WITH DIFFERENTIAL/PLATELET
BASOS PCT: 0 % (ref 0–1)
Basophils Absolute: 0.1 10*3/uL (ref 0.0–0.1)
EOS ABS: 0.3 10*3/uL (ref 0.0–0.7)
EOS PCT: 1 % (ref 0–5)
HEMATOCRIT: 27.3 % — AB (ref 39.0–52.0)
HEMOGLOBIN: 8.9 g/dL — AB (ref 13.0–17.0)
LYMPHS ABS: 1.5 10*3/uL (ref 0.7–4.0)
Lymphocytes Relative: 6 % — ABNORMAL LOW (ref 12–46)
MCH: 27.6 pg (ref 26.0–34.0)
MCHC: 32.6 g/dL (ref 30.0–36.0)
MCV: 84.5 fL (ref 78.0–100.0)
MONOS PCT: 7 % (ref 3–12)
Monocytes Absolute: 1.7 10*3/uL — ABNORMAL HIGH (ref 0.1–1.0)
Neutro Abs: 21.3 10*3/uL — ABNORMAL HIGH (ref 1.7–7.7)
Neutrophils Relative %: 86 % — ABNORMAL HIGH (ref 43–77)
Platelets: 310 10*3/uL (ref 150–400)
RBC: 3.23 MIL/uL — AB (ref 4.22–5.81)
RDW: 17.6 % — ABNORMAL HIGH (ref 11.5–15.5)
WBC: 24.8 10*3/uL — ABNORMAL HIGH (ref 4.0–10.5)

## 2014-10-04 LAB — TYPE AND SCREEN
ABO/RH(D): O POS
Antibody Screen: NEGATIVE
UNIT DIVISION: 0
UNIT DIVISION: 0

## 2014-10-04 LAB — PROTIME-INR
INR: 1.63 — AB (ref 0.00–1.49)
PROTHROMBIN TIME: 19.5 s — AB (ref 11.6–15.2)

## 2014-10-04 LAB — OCCULT BLOOD X 1 CARD TO LAB, STOOL: Fecal Occult Bld: NEGATIVE

## 2014-10-04 MED ORDER — FUROSEMIDE 10 MG/ML IJ SOLN
20.0000 mg | Freq: Two times a day (BID) | INTRAMUSCULAR | Status: DC
  Administered 2014-10-04 – 2014-10-07 (×7): 20 mg via INTRAVENOUS
  Filled 2014-10-04 (×9): qty 2

## 2014-10-04 MED ORDER — PERFLUTREN LIPID MICROSPHERE
1.0000 mL | INTRAVENOUS | Status: AC | PRN
  Administered 2014-10-04: 2 mL via INTRAVENOUS
  Filled 2014-10-04: qty 10

## 2014-10-04 MED ORDER — FUROSEMIDE 10 MG/ML IJ SOLN
20.0000 mg | Freq: Once | INTRAMUSCULAR | Status: AC
  Administered 2014-10-04: 20 mg via INTRAVENOUS
  Filled 2014-10-04 (×2): qty 2

## 2014-10-04 NOTE — Progress Notes (Signed)
Social Work Patient ID: Aaron Frye, male   DOB: 09-08-1924, 81 y.o.   MRN: [id number] Spoke with MD who reports pt had a bad weekend and currently has medical issues and will not discharge tomorrow.  Team aware and do not feel he is ready or he has met his Goals.  Will see how today goes and re-group regarding discharge date.

## 2014-10-04 NOTE — Progress Notes (Signed)
PRN tylenol given at 2030 for complain of left chest pain. Pain at site of hematoma. Tolerated blood transfusions without problems. 2nd transfusion completed at [facility]. Left chest hematoma, about the same as Saturday. Seems smaller per patient. Bruising to left axillary and flank. Right hip edema and bruising, with 3 small foam dressings C,D,& I. Pitting edema (+1) to RLE. BLE's discolored. PP's positive & warm to touch.  At 0030, No void in 8 hours, I & O cath=500cc's. Poor appetite. Patrici Ranks A

## 2014-10-04 NOTE — Plan of Care (Signed)
Problem: RH BLADDER ELIMINATION Goal: RH STG MANAGE BLADDER WITH ASSISTANCE STG Manage Bladder With Min Assistance  Outcome: Not Progressing Incontinent of urine. Requiring I & O caths.

## 2014-10-04 NOTE — Plan of Care (Signed)
Problem: RH BOWEL ELIMINATION Goal: RH STG MANAGE BOWEL W/MEDICATION W/ASSISTANCE STG Manage Bowel with Medication with min Assistance.  Outcome: Not Progressing No BM > 3 days   

## 2014-10-04 NOTE — Progress Notes (Signed)
PRN sorbitol given at [phone]. Last reported BM 09/30/14. Coughs and clears throat after liquid intake. Coughed up thick beige sputum this AM. Incontinent void, PVR=153cc's. Denies pain this AM. Aaron Frye A

## 2014-10-04 NOTE — Progress Notes (Signed)
Echocardiogram 2D Echocardiogram with Definity has been performed.  Aaron Frye 10/04/2014, 4:56 PM

## 2014-10-04 NOTE — Discharge Summary (Signed)
Discharge summary job # [id number]

## 2014-10-04 NOTE — Progress Notes (Signed)
Physical Therapy Session Note  Patient Details  Name: Aaron Frye MRN: [id number] Date of Birth: 08-May-1925  Today's Date: 10/04/2014 PT Individual Time: 1100-1200 PT Individual Time Calculation (min): 60 min   Short Term Goals: Week 1:  PT Short Term Goal 1 (Week 1): = LTG due ELOS  Skilled Therapeutic Interventions/Progress Updates:   Pt received sitting in w/c in room, agreeable to therapy session.  Assisted with donning shorts and shirt prior to leaving room, as pt was cold in gown.  Performed sit<>stand with min A and continued cues for forward trunk lean.  Once standing, pt able to pull up pants with BUEs and min/guard level for steadying.  Assisted pt to/from therapy gym in w/c for time management and energy conservation.  Once in therapy gym, transferred to/from therapy mat with RW at min A with continued cues for upright posture and increased forward trunk lean.  Performed supine<>sit at min/guard level.  Note better active movement in RLE today vs Friday.  Performed supine therex as follows; B quad sets x 10 reps, B glute sets x 10 reps, active assisted heel slides x 10 reps RLE, active assisted RLE hip abd x 10 reps and active assisted SLR x 10 reps RLE.  Ambulated x 30' with RW at min/guard level.  Cues for upright posture, and decreased antalgic gait pattern.  Performed seated nustep x 8 mins at level 3 resistance with BUE/LEs (eventually did without LUE due to increased pain in arm) for overall strengthening, endurance and ROM in R hip (gradually decreased distance from pedals throughout).  Ended session with gait x 45' x 1 with RW at min/guard level.  Same cues mentioned above.  Note that antalgic gait pattern increased with gait, therefore allowed pt to sit and assisted remainder of distance to room.  Left in w/c to eat lunch then RN to assist back to bed.  All needs in reach.   SaO2 95%-98% during session on RA, HR 112 at most during session.  RN made aware.   Therapy  Documentation Precautions:  Precautions Precautions: Fall Restrictions Weight Bearing Restrictions: Yes RLE Weight Bearing: Weight bearing as tolerated   Vital Signs: Therapy Vitals Pulse Rate: 82 BP: 125/74 mmHg Oxygen Therapy SpO2: 96 % O2 Device: Not Delivered Pain: pt with 3-4/10 pain in L shoulder, allowed rest breaks as needed to decrease pain.    Locomotion : Ambulation Ambulation/Gait Assistance: 4: Min assist   See FIM for current functional status  Therapy/Group: Individual Therapy  Denice Bors 10/04/2014, 11:21 AM

## 2014-10-04 NOTE — Progress Notes (Signed)
Occupational Therapy Session Note  Patient Details  Name: Aaron Frye MRN: [id number] Date of Birth: [birth year]  Today's Date: 10/04/2014 OT Individual Time: 1400-1526 OT Individual Time Calculation (min): 86 min    Short Term Goals: Week 1:  OT Short Term Goal 1 (Week 1): STGs= LTGs secondary to short estimated LOS  Skilled Therapeutic Interventions/Progress Updates:    Pt performed toileting, shower, and dressing during this session.  He was able to ambulate to the toilet with min assist using the RW.  Min assist for clothing management and hygiene as well before transferring to the walk-in shower.  He was able to wash with min assist sit to stand.  Integrated LH sponge and reacher for bathing and for drying LEs.  He transferred back out to the 3:1 over the toilet with min assist for dressing.  Min instructional cueing for use of the reacher and sockaide for dressing.  Consistent min assist for standing balance when pulling pants over his hips.  Once finished, he ambulated out to the bed.  Mod instructional cueing to take larger steps on the left side as he demonstrates short step length.  Pt positioned in bed with mod assist and family present.  Bed alarm also placed on with call bell within reach.   Therapy Documentation Precautions:  Precautions Precautions: Fall Restrictions Weight Bearing Restrictions: Yes RLE Weight Bearing: Weight bearing as tolerated  Vital Signs: Therapy Vitals Temp: 98 F (36.7 C) Temp Source: Oral Pulse Rate: (!) 102 BP: (!) 111/54 mmHg Patient Position (if appropriate): Sitting Oxygen Therapy SpO2: 97 % O2 Device: Not Delivered Pain: Pain Assessment Pain Assessment: Faces Pain Score: 2  Pain Type: Acute pain Pain Location: Hip Pain Orientation: Right Pain Intervention(s): Repositioned;Emotional support ADL: See FIM for current functional status  Therapy/Group: Individual Therapy  Lexy Meininger OTR/L 10/04/2014, 4:36 PM

## 2014-10-04 NOTE — Progress Notes (Signed)
[facility] PHYSICAL MEDICINE & REHABILITATION     PROGRESS NOTE    Subjective/Complaints: No chest pain but does have SOB, no cough, no fevers Received 2 UPRBCs 4/3 and  2U PRBC 3/30 Review of Systems - Negative except tired, shallow respObjective: Vital Signs: Blood pressure 123/63, pulse 101, temperature 98.3 F (36.8 C), temperature source Oral, resp. rate 19, weight 79.8 kg (175 lb 14.8 oz), SpO2 95 %. No results found.  Recent Labs  10/03/14 1007 10/03/14 1428  WBC 22.0* 22.4*  HGB 6.5* 6.8*  HCT 20.6* 21.2*  PLT 342 372    Recent Labs  10/02/14 0646  NA 135  K 4.4  CL 104  GLUCOSE 157*  BUN 26*  CREATININE 0.71  CALCIUM 8.1*   CBG (last 3)  No results for input(s): GLUCAP in the last 72 hours.  Wt Readings from Last 3 Encounters:  10/04/14 79.8 kg (175 lb 14.8 oz)  09/27/14 84.959 kg (187 lb 4.8 oz)  08/24/14 81.194 kg (179 lb)    Physical Exam:  Constitutional: He is oriented to person, place, and time. He appears well-developed.  HENT:  Head: Normocephalic and atraumatic.  Eyes: Conjunctivae and EOM are normal. Pupils are equal, round, and reactive to light.  Neck: Normal range of motion. Neck supple. . No thyromegaly present.  Cardiovascular: Normal rate and regular rhythm.  No murmur heard. Cardiac rate controlled  Respiratory: shallow inspiration diminished BS bilateral , no wheezes GI: Soft. Bowel sounds are normal. He exhibits no distension.  Musculoskeletal: He exhibits edema. Tender along left pec major tendon Neurological: He is alert and oriented to person, place, and time.  Right leg 2/5 prox to 3+ distally. UE 5/5. LLE 4/5 hf,ke 5/5 ankle. No sensory deficits  Skin: bruising along left upper ribs to nearly axilla. Hip incisions are dressed and limb is appropriately tender. Chronic vascular changes in the distal legs.  Psychiatric: He has a normal mood and affect. His behavior is normal  Assessment/Plan: 1. Functional deficits  secondary to right intertrochanteric femur fracture/IMN which require 3+ hours per day of interdisciplinary therapy in a comprehensive inpatient rehab setting. Physiatrist is providing close team supervision and 24 hour management of active medical problems listed below. Physiatrist and rehab team continue to assess barriers to discharge/monitor patient progress toward functional and medical goals. Will not be ready for D/C on 4/5 FIM: FIM - Bathing Bathing Steps Patient Completed: Chest, Right Arm, Left Arm, Abdomen (only washed UB) Bathing: 5: Supervision: Safety issues/verbal cues  FIM - Upper Body Dressing/Undressing Upper body dressing/undressing steps patient completed: Thread/unthread right sleeve of pullover shirt/dresss, Thread/unthread left sleeve of pullover shirt/dress, Put head through opening of pull over shirt/dress, Pull shirt over trunk Upper body dressing/undressing: 0: Wears gown/pajamas-no public clothing FIM - Lower Body Dressing/Undressing Lower body dressing/undressing steps patient completed: Thread/unthread left pants leg, Pull pants up/down Lower body dressing/undressing: 2: Max-Patient completed 25-49% of tasks  FIM - Toileting Toileting steps completed by patient: Adjust clothing prior to toileting, Performs perineal hygiene, Adjust clothing after toileting Toileting Assistive Devices: Grab bar or rail for support Toileting: 4: Steadying assist  FIM - Radio producer Devices: Grab bars, Insurance account manager Transfers: 4-To toilet/BSC: Min A (steadying Pt. > 75%), 4-From toilet/BSC: Min A (steadying Pt. > 75%)  FIM - Bed/Chair Transfer Bed/Chair Transfer Assistive Devices: Arm rests, Bed rails Bed/Chair Transfer: 3: Sit > Supine: Mod A (lifting assist/Pt. 50-74%/lift 2 legs), 2: Chair or W/C > Bed: Max A (lift  and lower assist)  FIM - Locomotion: Wheelchair Locomotion: Wheelchair: 0: Activity did not occur (Pt unable to tolerate  today) FIM - Locomotion: Ambulation Locomotion: Ambulation Assistive Devices: Administrator Ambulation/Gait Assistance: 4: Min guard Locomotion: Ambulation: 0: Activity did not occur (Pt unabl to tolerate today due to feeling ill)  Comprehension Comprehension Mode: Auditory Comprehension: 5-Follows basic conversation/direction: With no assist  Expression Expression Mode: Verbal Expression: 3-Expresses basic 50 - 74% of the time/requires cueing 25 - 50% of the time. Needs to repeat parts of sentences.  Social Interaction Social Interaction: 4-Interacts appropriately 75 - 89% of the time - Needs redirection for appropriate language or to initiate interaction.  Problem Solving Problem Solving: 4-Solves basic 75 - 89% of the time/requires cueing 10 - 24% of the time  Memory Memory: 4-Recognizes or recalls 75 - 89% of the time/requires cueing 10 - 24% of the time  Medical Problem List and Plan: 1. Functional deficits secondary to right intertrochanteric femur fracture. Status post IM nailing 09/24/2014. Weightbearing as tolerated 2. DVT Prophylaxis/Anticoagulation: Chronic Coumadin therapy for atrial fibrillation. Intravenous heparin until INR therapeutic. , off heparin heparin INR >2 3. Pain Management: Hydrocodone. Monitor with increased mobility 4. Acute blood loss anemia. s/p-blood transfusion 2u  - latest hgb 6.8 on 4/3 -check stool for OB.   -serial CBC's 5. Neuropsych: This patient is capable of making decisions on his own behalf. 6. Skin/Wound Care: Routine skin checks 7. Fluids/Electrolytes/Nutrition: encourage increased PO intake. 8. Hypertension. Altace 5 mg daily, Toprol-XL 50 mg daily. Monitor with increased mobility 9.  Leukocytosis - afeb, UA small leukocyte, 3-6 WBCs 10. Hyperlipidemia. Lipitor 11. Loose stools--resolved  -probiotic 12. CV:    -xopenex breathing treatment prn, suspect fluid overload post transfusion, check CXR, give IV lasix 20mg   will ask  cardiology to eval 13.  Pectoralis hematoma, large question etiology, pt denies injury to that area will fall LOS (Days) 7 A FACE TO FACE EVALUATION WAS PERFORMED  Charlett Blake 10/04/2014 7:16 AM

## 2014-10-04 NOTE — Consult Note (Signed)
Patient ID: Aaron Frye MRN: [id number] DOB/AGE: July 23, 1924 81 y.o.  Admit date: 09/27/2014 Referring Physician: Letta Pate Primary Cardiologist: Johnsie Cancel Reason for Consultation: CHF  HPI: 81 yo male with history of HLD, HTN, history of CVA, chronic atrial fibrillation on coumadin therapy, non-ischemic cardiomyopathy, mitral valve disease admitted after a fall and sustaining a right femur fracture. His hospital course has been complicated by acute blood loss anemia and UTI. He is now in the inpatient rehab unit at [facility] and has done well but having mild SOB. Chest x-ray this am with mild infiltrates, possibly pulmonary edema with trace bilateral pleural effusions. He is followed as an outpatient by Dr. Johnsie Cancel. Last echo in 2009 with LVEF=35-40%, MAC. He is on chronic coumadin therapy for his atrial fibrillation.   He has complaints of right leg pain, mild LE swelling and mild SOB but notes that his dyspnea is unchanged over the last week. No chest pain or dizziness.   Past Medical History  Diagnosis Date  . TIA (transient ischemic attack)   . HLD (hyperlipidemia)   . HTN (hypertension)   . CVA (cerebral vascular accident)   . Atrial fibrillation     Persisent   . Prostate cancer   . Non-ischemic cardiomyopathy     LVEF=35-40% by echo 2009  . Femur fracture, right March 2016    Family History  Problem Relation Age of Onset  . Heart failure Mother   . Lung disease Father   . Cancer Brother     Not sure which type of cancer    History   Social History  . Marital Status: Married    Spouse Name: N/A  . Number of Children: N/A  . Years of Education: N/A   Occupational History  . [employer]ist    Social History Main Topics  . Smoking status: Never Smoker   . Smokeless tobacco: Not on file  . Alcohol Use: No  . Drug Use: No  . Sexual Activity: Not on file   Other Topics Concern  . Not on file   Social History Narrative    Past Surgical History  Procedure  Laterality Date  . Appendectomy    . Tonsillectomy    . Intramedullary (im) nail intertrochanteric Right 09/24/2014    Procedure: INTRAMEDULLARY (IM) NAIL INTERTROCHANTRIC;  Surgeon: Rod Can, MD;  Location: [facility];  Service: Orthopedics;  Laterality: Right;    Allergies  Allergen Reactions  . Penicillins     REACTION: unspecified   Hospital Medications:  . sodium chloride   Intravenous Once  . aspirin  81 mg Oral Daily  . atorvastatin  40 mg Oral Daily  . cefpodoxime  200 mg Oral Q12H  . ferrous sulfate  325 mg Oral BID WC  . fluticasone  1 spray Each Nare Daily  . furosemide  20 mg Intravenous Once  . loratadine  10 mg Oral Daily  . metoprolol succinate  50 mg Oral Q breakfast  . ramipril  5 mg Oral Daily  . saccharomyces boulardii  250 mg Oral BID  . senna  2 tablet Oral QHS    Prior to Admission medications   Medication Sig Start Date End Date Taking? Authorizing Provider  aspirin 81 MG tablet Take 81 mg by mouth daily.      Historical Provider, MD  atorvastatin (LIPITOR) 40 MG tablet TAKE ONE TABLET BY MOUTH ONCE DAILY 07/29/14   Josue Hector, MD  hydrochlorothiazide (MICROZIDE) 12.5 MG capsule TAKE ONE CAPSULE  BY MOUTH ONCE DAILY IN THE MORNING 05/01/14   Josue Hector, MD  metoprolol succinate (TOPROL-XL) 100 MG 24 hr tablet Take 50 mg by mouth daily. Take with or immediately following a meal.    Historical Provider, MD  ramipril (ALTACE) 5 MG capsule TAKE ONE CAPSULE BY MOUTH ONCE DAILY 08/24/14   Josue Hector, MD  warfarin (COUMADIN) 5 MG tablet Take as directed by coumadin clinic Patient not taking: Reported on 09/23/2014 06/04/14   Josue Hector, MD  warfarin (COUMADIN) 5 MG tablet Take 2.5-5 mg by mouth See admin instructions. Take 5mg  every day except on Tuesday take 2.5mg  per patient    Historical Provider, MD   Review of systems complete and found to be negative unless listed above   Physical Exam: Blood pressure 125/74, pulse 82, temperature 98.3 F (36.8  C), temperature source Oral, resp. rate 19, weight 175 lb 14.8 oz (79.8 kg), SpO2 96 %.  General: Elderly male in NAD  HEENT: OP clear, mucus membranes moist  SKIN: warm, dry. No rashes.  Neuro: No focal deficits  Musculoskeletal: Muscle strength 5/5 all ext  Psychiatric: Mood and affect normal  Neck: No JVD, no carotid bruits, no thyromegaly, no lymphadenopathy.  Lungs: Decreased breath sounds bilateral bases. No wheezes, rhonci, crackles  Cardiovascular: Irregular. Soft systolic murmur. No gallops or rubs.  Abdomen:Soft. Bowel sounds present. Non-tender.  Extremities: Trace bilateral lower extremity edema with chronic venous stasis changes bilateral legs.   Labs:   Lab Results  Component Value Date   WBC 24.8* 10/04/2014   HGB 8.9* 10/04/2014   HCT 27.3* 10/04/2014   MCV 84.5 10/04/2014   PLT 310 10/04/2014    Recent Labs Lab 09/28/14 0325  10/02/14 0646  NA 133*  < > 135  K 3.7  < > 4.4  CL 99  < > 104  CO2 27  < > 27  BUN 27*  < > 26*  CREATININE 0.83  < > 0.71  CALCIUM 7.8*  < > 8.1*  PROT 4.8*  --   --   BILITOT 1.0  --   --   ALKPHOS 67  --   --   ALT 36  --   --   AST 45*  --   --   GLUCOSE 135*  < > 157*  < > = values in this interval not displayed.  Chest x-ray:  Mild interstitial prominence bilaterally without pulmonary edema. Trace bilateral pleural effusion with bilateral basilar atelectasis or infiltrate.  EKG: 09/23/14: Atrial fib  ASSESSMENT AND PLAN:   1. Acute systolic congestive heart failure: Pt has mild volume overload on exam. Chest x-ray with small effusions and interstitial prominence. Agree with diuresing with IV Lasix. Will also arrange echo to assess valves and LV function. Follow with renal function closely while diuresing. Of note, also with elevated WBC count but no clinical signs of pneumonia.   2. Non-ischemic cardiomyopathy: Repeat echo. Continue beta blocker and Ace-inh.   3. Atrial fibrillation, persistent: Rate controlled on  metoprolol. Continue coumadin for anti-coagulation. INR is 1.63 this am.    Signed: Lauree Chandler, MD 10/04/2014, 11:13 AM

## 2014-10-04 NOTE — Progress Notes (Signed)
Physical Therapy Session Note  Patient Details  Name: Aaron Frye MRN: [id number] Date of Birth: [birth year]  Today's Date: 10/04/2014 PT Individual Time: 0900-1000 PT Individual Time Calculation (min): 60 min   Short Term Goals: Week 1:  PT Short Term Goal 1 (Week 1): = LTG due ELOS  Skilled Therapeutic Interventions/Progress Updates:   Pt received lying in bed, agreeable to therapy.  Note that pt has very wet sounding secretions, however when given task of coughing, cough is very weak and no sputum is produced.  RN is aware and no hold on therapy.  SaO2 prior to beginning therapy was 95%, HR was 111.  Performed bed mobility with mod A and with use of bedrails due to increased work of breathing during task.  Once at EOB, note that SaO2 reading in the upper 80's and HR up to 120's.  Allowed several minutes of rest with cues for pursed lip breathing before beginning transfer to w/c.  Performed stand pivot transfer at min A level with facilitation for increased forward weight shift.  Cues for stepping sequence and breathing throughout transfer.  Once in w/c, SaO2 was 93% and HR up to 150 (per dynamap).  RN called to room to notify her.  RN then checked manually with reading in the 90's but was regularly irregular pattern.  Performed seated w/c therex with LAQ's x 10 reps BLE, seated marching x 15 reps BLE, pillow squeeze x 10 reps w/ 5 second hold and ankle pumps.  RT in room to provide pt with breathing treatment while performing exercises.  Continued to note improvement in breath sounds (was less audible) throughout session while in upright postures.  Remainder of session focused on toileting.  Pt ambulated to/from restroom with RW at min A level.  Note that gait very antalgic but improved with continued gait.  Pt incontinent and continent of both bowel and urine during session.  Ambulated to sink to wash/dry hands at min A level.  Assisted pt back to w/c and left with nurse tech to assist with  donning new gown.  All needs in reach.    Therapy Documentation Precautions:  Precautions Precautions: Fall Restrictions Weight Bearing Restrictions: Yes RLE Weight Bearing: Weight bearing as tolerated   Vital Signs: Therapy Vitals Temp: 98.3 F (36.8 C) Temp Source: Oral Pulse Rate: 82 Resp: 19 BP: 125/74 mmHg Patient Position (if appropriate): Lying Oxygen Therapy SpO2: 95 % O2 Device: Not Delivered Pain: Pt with 3/10 pain in L chest wall/pectoral area.  RN made aware.    See FIM for current functional status  Therapy/Group: Individual Therapy  Denice Bors 10/04/2014, 9:33 AM

## 2014-10-04 NOTE — Discharge Summary (Signed)
Aaron Frye, Aaron Frye NO.:  [id number]  MEDICAL RECORD NO.:  [id number]  LOCATION:  4M04C                        FACILITY:  [facility]  PHYSICIAN:  Charlett Blake, M.D.DATE OF BIRTH:  [birth year]  DATE OF ADMISSION:  09/27/2014 DATE OF DISCHARGE:  10/08/2014                              DISCHARGE SUMMARY   DISCHARGE DIAGNOSES: 1. Functional deficits secondary to right intertrochanteric hip     fracture. 2. Chronic Coumadin for atrial fibrillation/nonischemic cardiomyopathy. 3. Acute systolic congestive heart failure. 4. Pain management. 5. Acute blood loss anemia. 6. Hypertension. 7. Hyperlipidemia. 8. Pectoralis hematoma   HISTORY OF PRESENT ILLNESS:  This is an 81 year old right-handed male history of atrial fibrillation on chronic Coumadin who was independent prior to admission living with his wife.  He presented September 23, 2014, after a fall landing on his right side lying loss of consciousness. Cranial CT scan was negative.  INR on admission of 2.20.  Coumadin was reversed and underwent IM nailing.  On September 24, 2014, a right intertrochanteric femur fracture per Dr. Lyla Glassing.  Weightbearing as tolerated.  Hospital course pain management.  Chronic Coumadin resumed with heparin for bridging.  Acute blood loss anemia 8.7 and monitored. E. coli urinary tract infection, completed a course of Vantin.  Physical and occupational therapy ongoing.  The patient was admitted for a comprehensive rehab program.  PAST MEDICAL HISTORY:  See discharge diagnoses.  SOCIAL HISTORY:  Lives with spouse, independent prior to admission.  FUNCTIONAL STATUS:  Upon admission to rehab services was moderate assist to ambulate 20 feet with rolling walker, minimal assist to stand pivot transfers, min to mod assist for activities of daily living.  PHYSICAL EXAMINATION:  VITAL SIGNS:  Blood pressure 118/48, pulse 103, temperature 98, respirations 16. GENERAL:  This was an alert  male, oriented x3. LUNGS:  Clear to auscultation. CARDIAC:  Rate controlled. ABDOMEN:  Soft, nontender.  Good bowel sounds. EXTREMITIES:  Hip incision dressed, appropriately tender.  Chronic vascular changes distal lower extremities.  REHABILITATION HOSPITAL COURSE:  The patient was admitted to inpatient rehab services with therapies initiated on a 3-hour daily basis consisting of physical therapy, occupational therapy, and rehabilitation nursing.  The following issues were addressed during the patient's rehabilitation stay.  Pertaining to [name]' right intertrochanteric femur fracture, he had undergone IM nailing on September 24, 2014, weightbearing as tolerated.  He would follow up with Orthopedic Services.  He remained on chronic Coumadin therapy for atrial fibrillation with heparin until INR greater than 2.00.  No bleeding episodes.  He would follow up with Dr. Jenkins Rouge with blood draws arranged for [facility].  Pain management with use of hydrocodone.  Acute blood loss anemia.  He had been transfused during his hospital course for hemoglobin 6.8, latest hemoglobin 8.9.  Blood pressure is well controlled on Altace as well as Toprol.  He exhibited perhaps some mild fluid overload after his transfusion.  He did receive Lasix, in between transfused units.  Follow-up chest x-ray showed small effusions and interstitial prominence. Cardiology requested for follow-up received diuresis. A CT of the chest performed 10/01/2014 secondary to some chest wall swelling and ecchymosis showed a 7.5 x 8.5 x  13.2 cm acute intramuscular hematoma in the left lateral pectoralis muscle. Conservative care provided Coumadin was initially held to monitor for any bleeding episodes and later resumed 10/06/2014. His hemoglobin remained stable 8.6. The patient received weekly collaborative interdisciplinary team conferences to discuss estimated length of stay, family teaching, and any barriers to  his discharge.  He is able to sit up unsupported, ambulating short household distances with assistive device.  Strength and endurance continued to improve throughout his course.  He was able to gather his belongings for activities of daily living.  Minimal assist to stand pivot transfers with his rolling walker.  Full family teaching was completed and plan discharged to home.  DISCHARGE MEDICATIONS: 1. Aspirin 81 mg daily. 2. Lipitor 40 mg daily. 3. Ferrous sulfate 325 mg b.i.d. 4. Flonase 1 spray to each nostril daily. 5. Hydrocodone 1-2 tablets every 6 hours as needed for moderate pain,     dispense of 90 tablets. 6. Claritin 10 mg p.o. daily. 7. Robaxin 500 mg p.o. every 6 hours as needed for muscle spasms. 8. Toprol-XL 50 mg p.o. daily. 9. Altace 5 mg p.o. daily. 10.Florastor 250 mg p.o. b.i.d. 11.The patient remained on Coumadin therapy, latest dose is 5mg ,     adjusted accordingly for an INR of 2.0 to 3.0. 12. Lasix 20 mg twice a day 13. K Dur 20 mEq daily   DIET:  Regular.  FOLLOWUP:  The patient would follow up Charlett Blake, M.D., at the outpatient rehab service office as needed; Rod Can, M.D., call for appointment in 2 weeks; Wallis Bamberg. Johnsie Cancel, M.D., Cardiology Services, call for appointment.  SPECIAL INSTRUCTIONS:  Weightbearing as tolerated.  Home health nurse to check INR on October 11, 2014.  Results to [place], fax [phone].     Lauraine Rinne, P.A.   ______________________________ Charlett Blake, M.D.    DA/MEDQ  D:  10/04/2014  T:  10/04/2014  Job:  [id number]  cc:   Wallis Bamberg. Johnsie Cancel, MD, [facility] Rod Can, MD Juanita Craver, M.D.

## 2014-10-05 ENCOUNTER — Inpatient Hospital Stay (HOSPITAL_COMMUNITY): Payer: Medicare Other | Admitting: Rehabilitation

## 2014-10-05 ENCOUNTER — Inpatient Hospital Stay (HOSPITAL_COMMUNITY): Payer: Medicare Other

## 2014-10-05 DIAGNOSIS — I481 Persistent atrial fibrillation: Secondary | ICD-10-CM

## 2014-10-05 DIAGNOSIS — I5021 Acute systolic (congestive) heart failure: Secondary | ICD-10-CM

## 2014-10-05 LAB — BASIC METABOLIC PANEL
Anion gap: 5 (ref 5–15)
BUN: 29 mg/dL — AB (ref 6–23)
CHLORIDE: 101 mmol/L (ref 96–112)
CO2: 31 mmol/L (ref 19–32)
Calcium: 8.2 mg/dL — ABNORMAL LOW (ref 8.4–10.5)
Creatinine, Ser: 0.87 mg/dL (ref 0.50–1.35)
GFR calc Af Amer: 87 mL/min — ABNORMAL LOW (ref >90)
GFR calc non Af Amer: 75 mL/min — ABNORMAL LOW (ref >90)
Glucose, Bld: 224 mg/dL — ABNORMAL HIGH (ref 70–99)
POTASSIUM: 4.4 mmol/L (ref 3.5–5.1)
Sodium: 137 mmol/L (ref 135–145)

## 2014-10-05 LAB — CBC
HEMATOCRIT: 29.2 % — AB (ref 39.0–52.0)
Hemoglobin: 9.3 g/dL — ABNORMAL LOW (ref 13.0–17.0)
MCH: 27.5 pg (ref 26.0–34.0)
MCHC: 31.8 g/dL (ref 30.0–36.0)
MCV: 86.4 fL (ref 78.0–100.0)
Platelets: 333 10*3/uL (ref 150–400)
RBC: 3.38 MIL/uL — ABNORMAL LOW (ref 4.22–5.81)
RDW: 18.5 % — AB (ref 11.5–15.5)
WBC: 25.2 10*3/uL — ABNORMAL HIGH (ref 4.0–10.5)

## 2014-10-05 LAB — PROTIME-INR
INR: 1.43 (ref 0.00–1.49)
Prothrombin Time: 17.6 seconds — ABNORMAL HIGH (ref 11.6–15.2)

## 2014-10-05 MED ORDER — FUROSEMIDE 20 MG PO TABS
20.0000 mg | ORAL_TABLET | Freq: Every day | ORAL | Status: AC
  Filled 2014-10-05: qty 1

## 2014-10-05 NOTE — Progress Notes (Signed)
Occupational Therapy Session Note  Patient Details  Name: Aaron Frye MRN: [id number] Date of Birth: Apr 30, 1925  Today's Date: 10/05/2014 OT Individual Time: 0900-1000 OT Individual Time Calculation (min): 60 min    Short Term Goals: Week 1:  OT Short Term Goal 1 (Week 1): STGs= LTGs secondary to short estimated LOS  Skilled Therapeutic Interventions/Progress Updates:    Pt resting in w/c upon arrival and agreeable to therapy.  Pt stated he showered yesterday and preferred to bathe at sink this morning.  Pt stated he was fatigued, having just returned from PT.  Pt required min A for sit<>stand from w/c and steady A when standing.  Pt required extra time to complete bathing and dressing tasks with multiple rest breaks.  Pt completed grooming tasks seated in w/c at sink.  Pt stated that he felt like he "had worked an 8 hour day" at completion of therapy.  Focus on activity tolerance, sit<>stand, standing balance, and safety awareness.  Therapy Documentation Precautions:  Precautions Precautions: Fall Restrictions Weight Bearing Restrictions: Yes RLE Weight Bearing: Weight bearing as tolerated Pain: Pain Assessment Pain Score: 3  Pain Type: Acute pain;Surgical pain Pain Location: Hip Pain Orientation: Right Pain Descriptors / Indicators: Aching Pain Onset: On-going Pain Intervention(s): RN made aware;Repositioned  See FIM for current functional status  Therapy/Group: Individual Therapy  Leroy Libman 10/05/2014, 10:03 AM

## 2014-10-05 NOTE — Plan of Care (Signed)
Problem: RH Balance Goal: LTG Patient will maintain dynamic standing balance (PT) LTG: Patient will maintain dynamic standing balance with assistance during mobility activities (PT)  Upgraded to S due to error  Problem: RH Bed to Chair Transfers Goal: LTG Patient will perform bed/chair transfers w/assist (PT) LTG: Patient will perform bed/chair transfers with assistance, with/without cues (PT).  Upgraded due to PT error.   Problem: RH Ambulation Goal: LTG Patient will ambulate in community environment (PT) LTG: Patient will ambulate in community environment, # of feet with assistance (PT).  Outcome: Not Applicable Date Met:  10/05/14 Feel pt does not have endurance to ambulate in community at this time, recommend w/c level in community.      

## 2014-10-05 NOTE — Progress Notes (Signed)
[facility] PHYSICAL MEDICINE & REHABILITATION     PROGRESS NOTE    Subjective/Complaints: Appreciate cardiology consult ECHO result reviewed Pt states he feels better today Feel chest Hematoma getting smaller  Review of Systems - Negative except tired, shallow respObjective: Vital Signs: Blood pressure 116/57, pulse 105, temperature 98.3 F (36.8 C), temperature source Oral, resp. rate 16, weight 84.2 kg (185 lb 10 oz), SpO2 96 %. Dg Chest 2 View  10/04/2014   CLINICAL DATA:  Shortness of breath, weakness  EXAM: CHEST  2 VIEW  COMPARISON:  10/01/2014  FINDINGS: Cardiomegaly again noted. Mild interstitial prominence bilateral without convincing pulmonary edema. Trace bilateral pleural effusion with bilateral basilar atelectasis or infiltrate. Osteopenia and mild degenerative changes thoracic spine.  IMPRESSION: Mild interstitial prominence bilaterally without pulmonary edema. Trace bilateral pleural effusion with bilateral basilar atelectasis or infiltrate.   Electronically Signed   By: Lahoma Crocker M.D.   On: 10/04/2014 08:18    Recent Labs  10/03/14 1428 10/04/14 0747  WBC 22.4* 24.8*  HGB 6.8* 8.9*  HCT 21.2* 27.3*  PLT 372 310   No results for input(s): NA, K, CL, GLUCOSE, BUN, CREATININE, CALCIUM in the last 72 hours.  Invalid input(s): CO CBG (last 3)  No results for input(s): GLUCAP in the last 72 hours.  Wt Readings from Last 3 Encounters:  10/05/14 84.2 kg (185 lb 10 oz)  09/27/14 84.959 kg (187 lb 4.8 oz)  08/24/14 81.194 kg (179 lb)    Physical Exam:  Constitutional: He is oriented to person, place, and time. He appears well-developed.  HENT:  Head: Normocephalic and atraumatic.  Eyes: Conjunctivae and EOM are normal. Pupils are equal, round, and reactive to light.  Neck: Normal range of motion. Neck supple. . No thyromegaly present.  Cardiovascular: Normal rate and regular rhythm.  No murmur heard. Cardiac rate controlled  Respiratory: shallow  inspiration diminished BS bilateral , no wheezes GI: Soft. Bowel sounds are normal. He exhibits no distension.  Musculoskeletal: He exhibits edema. Tender along left pec major tendon Neurological: He is alert and oriented to person, place, and time.  Right leg 2/5 prox to 3+ distally. UE 5/5. LLE 4/5 hf,ke 5/5 ankle. No sensory deficits  Skin: bruising along left upper ribs to nearly axilla. Hip incisions are dressed and limb is appropriately tender. Chronic vascular changes in the distal legs.  Psychiatric: He has a normal mood and affect. His behavior is normal  Assessment/Plan: 1. Functional deficits secondary to right intertrochanteric femur fracture/IMN which require 3+ hours per day of interdisciplinary therapy in a comprehensive inpatient rehab setting. Physiatrist is providing close team supervision and 24 hour management of active medical problems listed below. Physiatrist and rehab team continue to assess barriers to discharge/monitor patient progress toward functional and medical goals.  FIM: FIM - Bathing Bathing Steps Patient Completed: Chest, Right Arm, Left Arm, Abdomen, Front perineal area, Buttocks, Right upper leg, Left upper leg, Right lower leg (including foot), Left lower leg (including foot) Bathing: 4: Steadying assist  FIM - Upper Body Dressing/Undressing Upper body dressing/undressing steps patient completed: Thread/unthread right sleeve of pullover shirt/dresss, Thread/unthread left sleeve of pullover shirt/dress, Put head through opening of pull over shirt/dress, Pull shirt over trunk Upper body dressing/undressing: 5: Supervision: Safety issues/verbal cues FIM - Lower Body Dressing/Undressing Lower body dressing/undressing steps patient completed: Thread/unthread right underwear leg, Thread/unthread left underwear leg, Pull underwear up/down, Thread/unthread right pants leg, Thread/unthread left pants leg, Pull pants up/down Lower body dressing/undressing: 3:  Mod-Patient completed  50-74% of tasks  FIM - Toileting Toileting steps completed by patient: Adjust clothing prior to toileting, Performs perineal hygiene Toileting Assistive Devices: Grab bar or rail for support (assisted with new brief and peri care for increased cleanliness) Toileting: 3: Mod-Patient completed 2 of 3 steps  FIM - Radio producer Devices: Grab bars, Insurance account manager Transfers: 4-To toilet/BSC: Min A (steadying Pt. > 75%), 4-From toilet/BSC: Min A (steadying Pt. > 75%)  FIM - Bed/Chair Transfer Bed/Chair Transfer Assistive Devices: Copy: 4: Bed > Chair or W/C: Min A (steadying Pt. > 75%), 3: Sit > Supine: Mod A (lifting assist/Pt. 50-74%/lift 2 legs)  FIM - Locomotion: Wheelchair Locomotion: Wheelchair: 0: Activity did not occur FIM - Locomotion: Ambulation Locomotion: Ambulation Assistive Devices: Administrator Ambulation/Gait Assistance: 4: Min assist Locomotion: Ambulation: 1: Travels less than 50 ft with minimal assistance (Pt.>75%)  Comprehension Comprehension Mode: Auditory Comprehension: 5-Follows basic conversation/direction: With no assist  Expression Expression Mode: Verbal Expression: 3-Expresses basic 50 - 74% of the time/requires cueing 25 - 50% of the time. Needs to repeat parts of sentences.  Social Interaction Social Interaction: 4-Interacts appropriately 75 - 89% of the time - Needs redirection for appropriate language or to initiate interaction.  Problem Solving Problem Solving: 4-Solves basic 75 - 89% of the time/requires cueing 10 - 24% of the time  Memory Memory: 4-Recognizes or recalls 75 - 89% of the time/requires cueing 10 - 24% of the time  Medical Problem List and Plan: 1. Functional deficits secondary to right intertrochanteric femur fracture. Status post IM nailing 09/24/2014. Weightbearing as tolerated 2. DVT Prophylaxis/Anticoagulation: Chronic Coumadin therapy for atrial  fibrillation. Intravenous heparin until INR therapeutic. , off heparin heparin INR >2 3. Pain Management: Hydrocodone. Monitor with increased mobility 4. Acute blood loss anemia. s/p-blood transfusion 2u  - latest hgb 6.8 on 4/3 -check stool for OB.   -serial CBC's 5. Neuropsych: This patient is capable of making decisions on his own behalf. 6. Skin/Wound Care: Routine skin checks 7. Fluids/Electrolytes/Nutrition: encourage increased PO intake. 8. Hypertension. Altace 5 mg daily, Toprol-XL 50 mg daily. Monitor with increased mobility 9.  Leukocytosis - afeb, UA small leukocyte, 3-6 WBCs 10. Hyperlipidemia. Lipitor 11. Loose stools--resolved  -probiotic 12. CV:    -xopenex breathing treatment prn, suspect fluid overload post transfusion, ECHO with nl Ej Fx   13.  Pectoralis hematoma, large related to fall (Days) 8 A FACE TO FACE EVALUATION WAS PERFORMED  Aaron Frye E 10/05/2014 7:02 AM

## 2014-10-05 NOTE — Progress Notes (Signed)
Physical Therapy Session Note  Patient Details  Name: Aaron Frye MRN: [id number] Date of Birth: 1924/12/23  Today's Date: 10/05/2014 PT Individual Time: 1030-1100 and 830-930 PT Individual Time Calculation (min): 30 min and 60 mins   Short Term Goals: Week 1:  PT Short Term Goal 1 (Week 1): = LTG due ELOS  Skilled Therapeutic Interventions/Progress Updates:  First AM session:  Pt received lying in bed, agreeable to therapy session.  Skilled session focused on bed mobility with HOB flat and without rails to better simulate home, as well as in the ADL apt to further simulate home, functional transfers, car transfer, gait in controlled and home environment and gait up/down ramp to simulate home entry.  Performed bed mobility in hospital bed at min A level with assist for trunk elevation, however was able to perform in ADL apt at S level with cues for sequencing and technique.  Assisted with threading pants as pt stood at min/guard level and pulled them up.  Ambulated x 100' with RW at close S to min/guard level with cues for upright posture, increased WB through RLE, and increased L step length.  Performed car transfer to sedan like height to simulate home.  Performed with min A with good tolerance to increased hip flex to get RLE into/out of car.  Continue to provide increased cues for forward weight shift due to decreased hip flex and pain.  Assisted to ADL apt and performed bed mobility at S level as stated above.  Ended session with negotiation up/down ramp with use of RW at min/guard to close S level.   Cues for increased forward weight shift when ascending ramp and upright posture when descending ramp.  Pt assisted back to room and left in w/c with all needs in reach.  SaO2 remained in upper 90's throughout with HR up to 120's, therefore allowed several rest breaks to allow HR to decrease, RN made aware.     Second AM session:  Pt received in w/c in room; agreeable to therapy session.  Treatment session focused on stair negotiation and ambulation in a home environment.  Pt transported total A in w/c for energy conservation to gym to complete stairs, but due to R hip pain and fatigue, pt was unable to complete activity.  Pt ambulated 20' with rolling walker and min A in ADL apartment (to simulate home environment), with a rest break required after completion of activity due to fatigue and elevated HR (above 100bpm).  Pt's family was in attendance during the session and discussed living arrangements with this PT, who reiterated importance of using of the walker for all functional ambulation, even in the bathroom and options for negotiation in tight spaces with RW.  Family to ensure that RW will fit in tight space and let PT/wife know for increased safety.  Also discussed having wife come in to participate as she will not be able to provide physical A, but will need to know what cues to provide for pt.  Family verbalized understanding.  Pt was transported in w/c total A back to room due to time constraints and was left in w/c with family present and all needs within reach.  Therapy Documentation Precautions:  Precautions Precautions: Fall Restrictions Weight Bearing Restrictions: Yes RLE Weight Bearing: Weight bearing as tolerated Vital Signs: Therapy Vitals Temp: 97.5 F (36.4 C) Temp Source: Oral Pulse Rate: 98 BP: (!) 96/46 mmHg Patient Position (if appropriate): Sitting Oxygen Therapy SpO2: 98 % O2 Device: Not Delivered Pain:  Pain Assessment Pain Assessment: 0-10 Pain Score: 0-No pain Pain Type: Acute pain Pain Location: Hip Pain Orientation: Right Pain Descriptors / Indicators: Aching Pain Frequency: Intermittent Pain Onset: With Activity Pain Intervention(s): Medication (See eMAR) Locomotion : Ambulation Ambulation/Gait Assistance: 4: Min assist   See FIM for current functional status  Therapy/Group: Individual Therapy  Denice Bors 10/05/2014, 1:44  PM

## 2014-10-05 NOTE — Plan of Care (Signed)
Problem: RH PAIN MANAGEMENT Goal: RH STG PAIN MANAGED AT OR BELOW PT'S PAIN GOAL At or lower than 5  Outcome: Progressing No c/o pain

## 2014-10-05 NOTE — Progress Notes (Signed)
Occupational Therapy Session Note  Patient Details  Name: Aaron Frye MRN: [id number] Date of Birth: 13-Sep-1924  Today's Date: 10/05/2014 OT Individual Time: 1400-1447 OT Individual Time Calculation (min): 47 min    Short Term Goals: Week 1:  OT Short Term Goal 1 (Week 1): STGs= LTGs secondary to short estimated LOS  Skilled Therapeutic Interventions/Progress Updates:    Pt seen for 1:1 OT session with focus on functional mobility, standing balance, activity tolerance, and bed mobility. Pt received sitting in w/c agreeable to therapy at this time. Engaged in functional mobility in ADL apartment and hallway at [facility] overall for functional ambulation. Pt required min A for sit<>stand multiple times throughout session. Completed bed mobility sit>supine at min A and supervision for supine>sit. Pt stood at hi-lo table at CGA-SBA for balance for 3 min while completing pipe tree puzzle. Engaged in 2 games of horse shoes in standing with min A overall for balance when reaching overhead and out of BOS. Pt completed sit<>stand 1x at supervision level during horse shoes. Pt returned to room, requesting to toilet. Pt ambulated room>toilet at CGA level using RW. Pt left sitting on toilet with NT present.   Therapy Documentation Precautions:  Precautions Precautions: Fall Restrictions Weight Bearing Restrictions: Yes RLE Weight Bearing: Weight bearing as tolerated General:   Vital Signs: Therapy Vitals Temp: 97.5 F (36.4 C) Temp Source: Oral Pulse Rate: 98 BP: (!) 119/50 mmHg (RN notified) Patient Position (if appropriate): Sitting Oxygen Therapy SpO2: 98 % O2 Device: Not Delivered Pain: Pain Assessment Pain Assessment: 0-10 Pain Score: 0-No pain Pain Type: Acute pain Pain Location: Hip Pain Orientation: Right Pain Descriptors / Indicators: Aching Pain Frequency: Intermittent Pain Onset: With Activity Pain Intervention(s): Medication (See eMAR) ADL:   Exercises:   Other  Treatments:    See FIM for current functional status  Therapy/Group: Individual Therapy  Duayne Cal 10/05/2014, 2:49 PM

## 2014-10-06 ENCOUNTER — Inpatient Hospital Stay (HOSPITAL_COMMUNITY): Payer: Medicare Other | Admitting: Occupational Therapy

## 2014-10-06 ENCOUNTER — Inpatient Hospital Stay (HOSPITAL_COMMUNITY): Payer: Medicare Other | Admitting: Rehabilitation

## 2014-10-06 DIAGNOSIS — I050 Rheumatic mitral stenosis: Secondary | ICD-10-CM

## 2014-10-06 DIAGNOSIS — I5033 Acute on chronic diastolic (congestive) heart failure: Secondary | ICD-10-CM

## 2014-10-06 LAB — PROTIME-INR
INR: 1.37 (ref 0.00–1.49)
PROTHROMBIN TIME: 17.1 s — AB (ref 11.6–15.2)

## 2014-10-06 LAB — CBC
HEMATOCRIT: 26.8 % — AB (ref 39.0–52.0)
HEMOGLOBIN: 8.6 g/dL — AB (ref 13.0–17.0)
MCH: 27.6 pg (ref 26.0–34.0)
MCHC: 32.1 g/dL (ref 30.0–36.0)
MCV: 85.9 fL (ref 78.0–100.0)
Platelets: 304 10*3/uL (ref 150–400)
RBC: 3.12 MIL/uL — AB (ref 4.22–5.81)
RDW: 18.8 % — ABNORMAL HIGH (ref 11.5–15.5)
WBC: 21 10*3/uL — ABNORMAL HIGH (ref 4.0–10.5)

## 2014-10-06 MED ORDER — WARFARIN SODIUM 5 MG PO TABS
5.0000 mg | ORAL_TABLET | Freq: Once | ORAL | Status: AC
  Administered 2014-10-06: 5 mg via ORAL
  Filled 2014-10-06: qty 1

## 2014-10-06 MED ORDER — WARFARIN - PHARMACIST DOSING INPATIENT: Freq: Every day | Status: DC

## 2014-10-06 NOTE — Patient Care Conference (Signed)
Inpatient RehabilitationTeam Conference and Plan of Care Update Date: 10/06/2014   Time: 11;30 AM    Patient Name: Aaron Frye      Medical Record Number: [id number]  Date of Birth: Nov 04, 1924 Sex: Male         Room/Bed: 4M04C/4M04C-01 Payor Info: Payor: MEDICARE / Plan: MEDICARE PART A AND B / Product Type: *No Product type* /    Admitting Diagnosis: FEMUR FX  Admit Date/Time:  09/27/2014  4:49 PM Admission Comments: No comment available   Primary Diagnosis:  Intertrochanteric fracture of right femur Principal Problem: Intertrochanteric fracture of right femur  Patient Active Problem List   Diagnosis Date Noted  . Diastolic CHF, acute on chronic   . Mitral stenosis   . Leukocytosis 09/24/2014  . Intertrochanteric fracture of right femur 09/24/2014  . Hip fracture 09/23/2014  . History of stroke 09/23/2014  . HLD (hyperlipidemia) 09/23/2014  . Fall   . Encounter for therapeutic drug monitoring 09/01/2013  . Long term current use of anticoagulant therapy 11/14/2010  . Congestive heart failure 04/18/2010  . EDEMA 10/12/2008  . Elevated lipids 10/08/2008  . Essential hypertension 10/08/2008  . Atrial fibrillation 10/08/2008  . CVA 10/08/2008  . Transient cerebral ischemia 10/08/2008    Expected Discharge Date: Expected Discharge Date: 10/08/14  Team Members Present: Physician leading conference: Dr. Alysia Penna Social Worker Present: Ovidio Kin, LCSW Nurse Present: Heather Roberts, RN PT Present: Cameron Sprang, PT OT Present: Benay Pillow, OT SLP Present: Windell Moulding, SLP PPS Coordinator present : Daiva Nakayama, RN, CRRN     Current Status/Progress Goal Weekly Team Focus  Medical   anemia, ABLA pectoral hematoma, CT otherwise unremarkable  Improve activity tolerance for home d/c  family training, monitor CBC, restart warfarin   Bowel/Bladder   Continent of bowel during the day. Incontinent @hs . Continent of bowel. LBM 10/05/14  Managed bladder program  Decrease  fluid intake after 7pm   Swallow/Nutrition/ Hydration     na        ADL's   supervision for grooming and bathing at shower level, Steady assist for all other ADLs and functional transfers overall  supervision overall  self care retraining, functional mobilty/transfers, pt/family education, discharge planning, and activity tolerance   Mobility   S for bed mobility, S for stand pivot transfers (except for from low surface), S for gait, min A stairs.   S overall (downgraded due to lack of progress)  activity tolerance, gait, therex, transfers, bed mobility, pt/family education.    Communication             Safety/Cognition/ Behavioral Observations    no unsafe behaviors        Pain   No c/o pain   <3  Monitor for nonverbal cues of pain. Offer pain medication 1hr prior to initial therapy session   Skin   Hematoma to L chest decreasing. BLE with discoloration with pitting edema R>L, ecchymosis to BUE. axillary, abdomen  No additonal skin breakdown  Assess for decrease bruising, Encourage turn q 2hrs      *See Care Plan and progress notes for long and short-term goals.  Barriers to Discharge: see above    Possible Resolutions to Barriers:  extend stay by 3days to resolve above issues    Discharge Planning/Teaching Needs:  Home with wife who can be there with him-here for some therapies. Pt feeling better now and ready for therapies      Team Discussion: recovering from medical issues this past week.  Downgraded to supervision level-feels better today and ready to go to therapies. Will need family education prior to discharge.  Revisions to Treatment Plan:  Downgraded goals to supervision level   Continued Need for Acute Rehabilitation Level of Care: The patient requires daily medical management by a physician with specialized training in physical medicine and rehabilitation for the following conditions: Daily direction of a multidisciplinary physical rehabilitation program to ensure  safe treatment while eliciting the highest outcome that is of practical value to the patient.: Yes Daily medical management of patient stability for increased activity during participation in an intensive rehabilitation regime.: Yes Daily analysis of laboratory values and/or radiology reports with any subsequent need for medication adjustment of medical intervention for : Post surgical problems;Other  Lasheena Frieze, Gardiner Rhyme 10/07/2014, 8:45 AM

## 2014-10-06 NOTE — Progress Notes (Signed)
Patient Name: Aaron Frye Date of Encounter: 10/06/2014  Principal Problem:   Intertrochanteric fracture of right femur Active Problems:   Essential hypertension   Atrial fibrillation   Congestive heart failure   Length of Stay: 9  SUBJECTIVE  Both the patient and his nurse believe he is breathing better. Tolerated PT Weight is clearly not recorded accurately, no record of intake/output.  CURRENT MEDS . sodium chloride   Intravenous Once  . aspirin  81 mg Oral Daily  . atorvastatin  40 mg Oral Daily  . ferrous sulfate  325 mg Oral BID WC  . fluticasone  1 spray Each Nare Daily  . furosemide  20 mg Intravenous Q12H  . loratadine  10 mg Oral Daily  . metoprolol succinate  50 mg Oral Q breakfast  . ramipril  5 mg Oral Daily  . saccharomyces boulardii  250 mg Oral BID  . senna  2 tablet Oral QHS    OBJECTIVE   Intake/Output Summary (Last 24 hours) at 10/06/14 1154 Last data filed at 10/06/14 0533  Gross per 24 hour  Intake    120 ml  Output    400 ml  Net   -280 ml   Filed Weights   10/04/14 0500 10/05/14 0540 10/06/14 0532  Weight: 175 lb 14.8 oz (79.8 kg) 185 lb 10 oz (84.2 kg) 176 lb 9.4 oz (80.1 kg)    PHYSICAL EXAM Filed Vitals:   10/05/14 1340 10/05/14 1353 10/06/14 0532 10/06/14 0815  BP: 96/46 119/50 107/62 118/42  Pulse: 98  102 106  Temp: 97.5 F (36.4 C)  98.6 F (37 C)   TempSrc: Oral  Oral   Resp:   18   Weight:   176 lb 9.4 oz (80.1 kg)   SpO2: 98%  96%    General: Alert, oriented x3, no distress Head: no evidence of trauma, PERRL, EOMI, no exophtalmos or lid lag, no myxedema, no xanthelasma; normal ears, nose and oropharynx Neck: normal jugular venous pulsations and no hepatojugular reflux; brisk carotid pulses without delay and no carotid bruits Chest: clear to auscultation, no signs of consolidation by percussion or palpation, normal fremitus, symmetrical and full respiratory excursions Cardiovascular: normal position and quality of the  apical impulse, irregular rhythm, normal first and second heart sounds, no rubs or gallops, 1/6 systolic  murmur Abdomen: no tenderness or distention, no masses by palpation, no abnormal pulsatility or arterial bruits, normal bowel sounds, no hepatosplenomegaly Extremities: no clubbing, cyanosis or edema; 2+ radial, ulnar and brachial pulses bilaterally; 2+ right femoral, posterior tibial and dorsalis pedis pulses; 2+ left femoral, posterior tibial and dorsalis pedis pulses; no subclavian or femoral bruits Neurological: grossly nonfocal  LABS  CBC  Recent Labs  10/03/14 1428 10/04/14 0747 10/05/14 0648 10/06/14 0546  WBC 22.4* 24.8* 25.2* 21.0*  NEUTROABS 18.4* 21.3*  --   --   HGB 6.8* 8.9* 9.3* 8.6*  HCT 21.2* 27.3* 29.2* 26.8*  MCV 82.2 84.5 86.4 85.9  PLT 372 310 333 734   Basic Metabolic Panel  Recent Labs  10/05/14 0910  NA 137  K 4.4  CL 101  CO2 31  GLUCOSE 224*  BUN 29*  CREATININE 0.87  CALCIUM 8.2*    Radiology Studies Imaging results have been reviewed and No results found.    ASSESSMENT AND PLAN  Seems to be clinically compensated, no overt hypervolemia. Please call us back if his clinical status worsens. Accurate daily weight will be very helpful in detecting decompensation before  it occurs.   Sanda Klein, MD, [facility] [phone] office [phone] pager 10/06/2014 11:54 AM

## 2014-10-06 NOTE — Progress Notes (Signed)
Physical Therapy Session Note  Patient Details  Name: Aaron Frye MRN: [id number] Date of Birth: Dec 12, 1924  Today's Date: 10/06/2014 PT Individual Time: 0830-0930 PT Individual Time Calculation (min): 60 min   Short Term Goals: Week 1:  PT Short Term Goal 1 (Week 1): = LTG due ELOS  Skilled Therapeutic Interventions/Progress Updates:   Pt received sitting in recliner in room, finishing breakfast and agreeable to therapy session.  States that MD said he would be D/Cing tomorrow.  Will discuss this in team conference as this PT has not yet trained wife and has not educated her on safety concerns with pt.  Skilled session focused on gait for quality and increased distance, bed mobility, functional toilet transfers and toileting tasks, and stair training for increased strengthening and endurance.  Performed gait x 100' x 1 and another 30' x 1 with RW at S level with continued intermittent cues for upright head and posture with increased L step length to decrease antalgic gait pattern.  Once in ADL apt, ambulated over carpet to R side of bed.  Performed bed mobility at S level today with marked improvement in moving RLE on his own today.  Note that HR up to 140's following gait and bed mobility, therefore allowed increased rest break to decrease.  Ambulated to gym as above.  Once in therapy gym, pt stating needing to use restroom, therefore assisted back to room.  Ambulated to restroom with S level and performed all toileting at S level all tasks.  Assisted back to ortho gym to finish with stair negotiation up/down 6, 6" steps with L handrail and R HHA at min A level.  Cues for sequencing and technique but tolerated better than previous sessions.  Pt assisted back to room and left at sink to brush teeth, nurse tech notified.    Therapy Documentation Precautions:  Precautions Precautions: Fall Restrictions Weight Bearing Restrictions: Yes RLE Weight Bearing: Weight bearing as tolerated   Vital  Signs: Therapy Vitals Temp: 98.6 F (37 C) Temp Source: Oral Pulse Rate: (!) 106 Resp: 18 BP: (!) 118/42 mmHg Patient Position (if appropriate): Sitting Oxygen Therapy SpO2: 96 % O2 Device: Not Delivered Pain: 4/10 pain in R hip, allowed rest breaks to relieve.   See FIM for current functional status  Therapy/Group: Individual Therapy  Denice Bors 10/06/2014, 9:21 AM

## 2014-10-06 NOTE — Progress Notes (Signed)
Occupational Therapy Session Note  Patient Details  Name: Aaron Frye MRN: [id number] Date of Birth: 19-Aug-1924  Today's Date: 10/06/2014 OT Individual Time: 6073-7106 and 1500-1530 OT Individual Time Calculation (min): 60 min and 30 min   Short Term Goals: Week 1:  OT Short Term Goal 1 (Week 1): STGs= LTGs secondary to short estimated LOS   Skilled Therapeutic Interventions/Progress Updates:  Session 1:Upon entering the room, pt supine in bed with 2/10 c/o pain in R LE. Pt requesting shower this session. Supine >sit with supervision. Pt ambulated with RW and steady assist to obtain clothing from drawers. Pt ambulated to bathroom for toileting with RW. Toilet transfer with use of elevated toilet seat and steady assist. Pt required steady assist during clothing management. Pt then transferred with steady assist into shower to sit on shower seat for bathing. Pt utilizing [place] reacher to wash B feet. Pt dressing from EOB with use of AE in order to increase I with self care tasks. Pt utilized sock aide and [place] reacher for [employer] Probation officer. Pt seated in recliner chair with breakfast tray placed in front of him and call bell within reach upon exiting the room.   Session 2: Upon exiting the room, pt seated in recliner chair with 3/10 c/o pain in R LE. Pt requesting need to toilet upon entering the room. STS from recliner with min verbal cues for technique such as anterior weight shifts and foot placement. Pt ambulating with supervision to bathroom and supervision for toilet transfer onto elevated toilet seat. Pt able to void with steady assist for clothing management. Pt appearing to be SOB as pt grunting while pushing urine from bladder. O2 stats WFLs during tasks. Pt ambulated to RW to return to recliner chair and ice applied to R hip as pt reports increased pain. OT assisted to elevated B LEs secondary to edema and call bell within reach upon exiting the room.   Therapy  Documentation Precautions:  Precautions Precautions: Fall Restrictions Weight Bearing Restrictions: Yes RLE Weight Bearing: Weight bearing as tolerated  Pain: Pain Assessment Pain Assessment: Faces Pain Type: Surgical pain Pain Location: Hip Pain Orientation: Right  See FIM for current functional status  Therapy/Group: Individual Therapy  Phineas Semen 10/06/2014, 12:47 PM

## 2014-10-06 NOTE — Progress Notes (Signed)
Occupational Therapy Session Note  Patient Details  Name: Aaron Frye MRN: [id number] Date of Birth: 1924-09-12  Today's Date: 10/06/2014 OT Individual Time: 1101-1201 OT Individual Time Calculation (min): 60 min    Short Term Goals: Week 1:  OT Short Term Goal 1 (Week 1): STGs= LTGs secondary to short estimated LOS  Skilled Therapeutic Interventions/Progress Updates:    Pt performed functional mobility with the RW to the therapy apartment.  He continues to demonstrate cervical flexion and thoracic kyphosis in standing.  Min assist for ambulation as well.  In the ADL apartment he was able to practice simulated walk-in shower transfer with min assist, stepping in backwards and then turning to sit on the seat.  Once he completed task, therapist rolled him to the therapy gym for further work in sit to stand and standing endurance.  He was able to perform standing at the high low table with min assist while engaged in therapeutic activity.  Multiple sit to stand transitions occurred during task with pt standing for 4-5 minute intervals.  Returned to room at end of session with transfer back to his bedside chair with min assist.   Therapy Documentation Precautions:  Precautions Precautions: Fall Restrictions Weight Bearing Restrictions: Yes RLE Weight Bearing: Weight bearing as tolerated  Pain: Pain Assessment Pain Assessment: Faces Pain Type: Surgical pain Pain Location: Hip Pain Orientation: Right ADL: See FIM for current functional status  Therapy/Group: Individual Therapy  Alisabeth Selkirk OTR/L 10/06/2014, 12:23 PM

## 2014-10-06 NOTE — Progress Notes (Addendum)
[facility]ICINE & REHABILITATION     PROGRESS NOTE    Subjective/Complaints: Needing Sup for ADLs per OT Feel chest Hematoma getting smaller  Review of Systems - Negative except tired, shallow respObjective: Vital Signs: Blood pressure 107/62, pulse 102, temperature 98.6 F (37 C), temperature source Oral, resp. rate 18, weight 80.1 kg (176 lb 9.4 oz), SpO2 96 %. Dg Chest 2 View  10/04/2014   CLINICAL DATA:  Shortness of breath, weakness  EXAM: CHEST  2 VIEW  COMPARISON:  10/01/2014  FINDINGS: Cardiomegaly again noted. Mild interstitial prominence bilateral without convincing pulmonary edema. Trace bilateral pleural effusion with bilateral basilar atelectasis or infiltrate. Osteopenia and mild degenerative changes thoracic spine.  IMPRESSION: Mild interstitial prominence bilaterally without pulmonary edema. Trace bilateral pleural effusion with bilateral basilar atelectasis or infiltrate.   Electronically Signed   By: Lahoma Crocker M.D.   On: 10/04/2014 08:18    Recent Labs  10/05/14 0648 10/06/14 0546  WBC 25.2* 21.0*  HGB 9.3* 8.6*  HCT 29.2* 26.8*  PLT 333 304    Recent Labs  10/05/14 0910  NA 137  K 4.4  CL 101  GLUCOSE 224*  BUN 29*  CREATININE 0.87  CALCIUM 8.2*   CBG (last 3)  No results for input(s): GLUCAP in the last 72 hours.  Wt Readings from Last 3 Encounters:  10/06/14 80.1 kg (176 lb 9.4 oz)  09/27/14 84.959 kg (187 lb 4.8 oz)  08/24/14 81.194 kg (179 lb)    Physical Exam:  Constitutional: He is oriented to person, place, and time. He appears well-developed.  HENT:  Head: Normocephalic and atraumatic.  Eyes: Conjunctivae and EOM are normal. Pupils are equal, round, and reactive to light.  Neck: Normal range of motion. Neck supple. . No thyromegaly present.  Cardiovascular: Normal rate and regular rhythm.  No murmur heard. Cardiac rate controlled  Respiratory: shallow inspiration diminished BS bilateral , no wheezes GI: Soft.  Bowel sounds are normal. He exhibits no distension.  Musculoskeletal: He exhibits edema. Tender along left pec major tendon Neurological: He is alert and oriented to person, place, and time.  Right leg 2/5 prox to 3+ distally. UE 5/5. LLE 4/5 hf,ke 5/5 ankle. No sensory deficits  Skin: bruising along left upper ribs to nearly axilla. Hip incisions are dressed and limb is appropriately tender. Chronic vascular changes in the distal legs.  Psychiatric: He has a normal mood and affect. His behavior is normal  Assessment/Plan: 1. Functional deficits secondary to right intertrochanteric femur fracture/IMN which require 3+ hours per day of interdisciplinary therapy in a comprehensive inpatient rehab setting. Physiatrist is providing close team supervision and 24 hour management of active medical problems listed below. Physiatrist and rehab team continue to assess barriers to discharge/monitor patient progress toward functional and medical goals. Team conf today Plan D/C in am, ask for cardiology f/u prior to d/c       FIM: FIM - Bathing Bathing Steps Patient Completed: Chest, Right Arm, Left Arm, Abdomen, Front perineal area, Buttocks, Right upper leg, Left upper leg Bathing: 4: Min-Patient completes 8-9 89f 10 parts or 75+ percent  FIM - Upper Body Dressing/Undressing Upper body dressing/undressing steps patient completed: Thread/unthread right sleeve of pullover shirt/dresss, Thread/unthread left sleeve of pullover shirt/dress, Put head through opening of pull over shirt/dress, Pull shirt over trunk Upper body dressing/undressing: 5: Supervision: Safety issues/verbal cues FIM - Lower Body Dressing/Undressing Lower body dressing/undressing steps patient completed: Thread/unthread right pants leg, Thread/unthread left pants leg, Pull pants  up/down Lower body dressing/undressing: 3: Mod-Patient completed 50-74% of tasks  FIM - Toileting Toileting steps completed by patient: Adjust clothing prior  to toileting, Performs perineal hygiene Toileting Assistive Devices: Grab bar or rail for support (assisted with new brief and peri care for increased cleanliness) Toileting: 3: Mod-Patient completed 2 of 3 steps  FIM - Radio producer Devices: Grab bars, Insurance account manager Transfers: 4-To toilet/BSC: Min A (steadying Pt. > 75%), 4-From toilet/BSC: Min A (steadying Pt. > 75%)  FIM - Bed/Chair Transfer Bed/Chair Transfer Assistive Devices: Copy: 5: Supine > Sit: Supervision (verbal cues/safety issues), 4: Sit > Supine: Min A (steadying pt. > 75%/lift 1 leg), 4: Bed > Chair or W/C: Min A (steadying Pt. > 75%), 4: Chair or W/C > Bed: Min A (steadying Pt. > 75%)  FIM - Locomotion: Wheelchair Locomotion: Wheelchair: 0: Activity did not occur FIM - Locomotion: Ambulation Locomotion: Ambulation Assistive Devices: Administrator Ambulation/Gait Assistance: 5: Supervision, 4: Min guard Locomotion: Ambulation: 2: Travels 50 - 149 ft with minimal assistance (Pt.>75%)  Comprehension Comprehension Mode: Auditory Comprehension: 5-Follows basic conversation/direction: With extra time/assistive device  Expression Expression Mode: Verbal Expression: 3-Expresses basic 50 - 74% of the time/requires cueing 25 - 50% of the time. Needs to repeat parts of sentences.  Social Interaction Social Interaction: 4-Interacts appropriately 75 - 89% of the time - Needs redirection for appropriate language or to initiate interaction.  Problem Solving Problem Solving: 4-Solves basic 75 - 89% of the time/requires cueing 10 - 24% of the time  Memory Memory: 4-Recognizes or recalls 75 - 89% of the time/requires cueing 10 - 24% of the time  Medical Problem List and Plan: 1. Functional deficits secondary to right intertrochanteric femur fracture. Status post IM nailing 09/24/2014. Weightbearing as tolerated 2. DVT Prophylaxis/Anticoagulation: Chronic Coumadin therapy for  atrial fibrillation. Intravenous heparin until INR therapeutic. , off heparin heparin INR >2 3. Pain Management: Hydrocodone. Monitor with increased mobility 4. Acute blood loss anemia. s/p-blood transfusion 2u  - latest hgb 8.6 on 4/6 -check stool for OB.   -serial CBC's 5. Neuropsych: This patient is capable of making decisions on his own behalf. 6. Skin/Wound Care: Routine skin checks 7. Fluids/Electrolytes/Nutrition: encourage increased PO intake. 8. Hypertension. Altace 5 mg daily, Toprol-XL 50 mg daily. Monitor with increased mobility 9.  Leukocytosis - afeb, UA small leukocyte, 3-6 WBCs 10. Hyperlipidemia. Lipitor 11. Loose stools--resolved  -probiotic 12. CV:    -xopenex breathing treatment prn, suspect fluid overload post transfusion, ECHO with nl Ej Fx   13.  Pectoralis hematoma, large related to fall , diameter is decreasing (Days) 9 A FACE TO FACE EVALUATION WAS PERFORMED  Charlett Blake 10/06/2014 7:47 AM

## 2014-10-06 NOTE — Progress Notes (Signed)
Pt not in room. He has CAF and has been on Coumadin. Previous LVF 30-40%, now 60% by echo 10/04/14 with pulm HTN and mild MS. He received on dose of Lasix 10/04/14 for possible CHF. If he is better than I suspect no further work up needed. Will review with MD     Kerin Ransom PA-C 10/06/2014 11:38 AM

## 2014-10-06 NOTE — Progress Notes (Signed)
ANTICOAGULATION CONSULT NOTE - Follow Up Consult  Pharmacy Consult for coumadin Indication: atrial fibrillation  Allergies  Allergen Reactions  . Penicillins     REACTION: unspecified    Patient Measurements: Weight: 176 lb 9.4 oz (80.1 kg) Heparin Dosing Weight:   Vital Signs: Temp: 98.6 F (37 C) (04/06 0532) Temp Source: Oral (04/06 0532) BP: 118/42 mmHg (04/06 0815) Pulse Rate: 106 (04/06 0815)  Labs:  Recent Labs  10/04/14 0747 10/05/14 0648 10/05/14 0910 10/06/14 0546  HGB 8.9* 9.3*  --  8.6*  HCT 27.3* 29.2*  --  26.8*  PLT 310 333  --  304  LABPROT 19.5* 17.6*  --  17.1*  INR 1.63* 1.43  --  1.37  CREATININE  --   --  0.87  --     Estimated Creatinine Clearance: 60.7 mL/min (by C-G formula based on Cr of 0.87).   Medications:  Scheduled:  . sodium chloride   Intravenous Once  . aspirin  81 mg Oral Daily  . atorvastatin  40 mg Oral Daily  . ferrous sulfate  325 mg Oral BID WC  . fluticasone  1 spray Each Nare Daily  . furosemide  20 mg Intravenous Q12H  . loratadine  10 mg Oral Daily  . metoprolol succinate  50 mg Oral Q breakfast  . ramipril  5 mg Oral Daily  . saccharomyces boulardii  250 mg Oral BID  . senna  2 tablet Oral QHS  . warfarin  5 mg Oral ONCE-1800  . Warfarin - Pharmacist Dosing Inpatient   Does not apply q1800   Infusions:    Assessment: 81 yo male with afib will be resumed on coumadin.  Anticoagulation was on hold at one time due to hematoma.  Team is ok to resume coumadin but without bridge.  INR today is 1.37 from 1.43.   Goal of Therapy:  INR 2-3 Monitor platelets by anticoagulation protocol: Yes   Plan:  - coumadin 5mg  po x1 (no bridge per team's request) - INR in am Watch Hgb, FOB, hematuria, hematoma  Aaron Frye, Tsz-Yin 10/06/2014,11:58 AM

## 2014-10-07 ENCOUNTER — Inpatient Hospital Stay (HOSPITAL_COMMUNITY): Payer: Medicare Other | Admitting: Occupational Therapy

## 2014-10-07 ENCOUNTER — Inpatient Hospital Stay (HOSPITAL_COMMUNITY): Payer: Medicare Other | Admitting: Rehabilitation

## 2014-10-07 LAB — CBC WITH DIFFERENTIAL/PLATELET
Basophils Absolute: 0 10*3/uL (ref 0.0–0.1)
Basophils Relative: 0 % (ref 0–1)
Eosinophils Absolute: 0.4 10*3/uL (ref 0.0–0.7)
Eosinophils Relative: 2 % (ref 0–5)
HCT: 31.1 % — ABNORMAL LOW (ref 39.0–52.0)
Hemoglobin: 9.7 g/dL — ABNORMAL LOW (ref 13.0–17.0)
Lymphocytes Relative: 5 % — ABNORMAL LOW (ref 12–46)
Lymphs Abs: 1 10*3/uL (ref 0.7–4.0)
MCH: 27 pg (ref 26.0–34.0)
MCHC: 31.2 g/dL (ref 30.0–36.0)
MCV: 86.6 fL (ref 78.0–100.0)
MONO ABS: 1.2 10*3/uL — AB (ref 0.1–1.0)
MONOS PCT: 6 % (ref 3–12)
NEUTROS PCT: 87 % — AB (ref 43–77)
Neutro Abs: 18.1 10*3/uL — ABNORMAL HIGH (ref 1.7–7.7)
PLATELETS: 372 10*3/uL (ref 150–400)
RBC: 3.59 MIL/uL — AB (ref 4.22–5.81)
RDW: 19 % — ABNORMAL HIGH (ref 11.5–15.5)
WBC: 20.7 10*3/uL — ABNORMAL HIGH (ref 4.0–10.5)

## 2014-10-07 LAB — PROTIME-INR
INR: 1.26 (ref 0.00–1.49)
Prothrombin Time: 16 seconds — ABNORMAL HIGH (ref 11.6–15.2)

## 2014-10-07 MED ORDER — WARFARIN SODIUM 5 MG PO TABS
5.0000 mg | ORAL_TABLET | Freq: Once | ORAL | Status: AC
  Administered 2014-10-07: 5 mg via ORAL
  Filled 2014-10-07: qty 1

## 2014-10-07 NOTE — Progress Notes (Signed)
Social Work Patient ID: Aaron Frye, male   DOB: 08-07-1924, 81 y.o.   MRN: [id number] Met with pt and wife to discuss discharge tomorrow and need for family education.  Wife will try to stay until 3;00 today for OT and come in am. She can not come before 10;00 because she has things to get done.  Both pleased with his progress and feel prepared for discharge.  It is important That wife see's him in therapy to see what he needs assistance with.  See in am.

## 2014-10-07 NOTE — Progress Notes (Signed)
Social Work [name], LCSW Social Worker Signed  Patient Care Conference 10/06/2014  2:44 PM    Expand All Collapse All   Inpatient RehabilitationTeam Conference and Plan of Care Update Date: 10/06/2014   Time: 11;30 AM     Patient Name: Aaron Frye       Medical Record Number: [id number]  Date of Birth: 01/04/1925 Sex: Male         Room/Bed: 4M04C/4M04C-01 Payor Info: Payor: MEDICARE / Plan: MEDICARE PART A AND B / Product Type: *No Product type* /    Admitting Diagnosis: FEMUR FX   Admit Date/Time:  09/27/2014  4:49 PM Admission Comments: No comment available   Primary Diagnosis:  Intertrochanteric fracture of right femur Principal Problem: Intertrochanteric fracture of right femur    Patient Active Problem List     Diagnosis  Date Noted   .  Diastolic CHF, acute on chronic     .  Mitral stenosis     .  Leukocytosis  09/24/2014   .  Intertrochanteric fracture of right femur  09/24/2014   .  Hip fracture  09/23/2014   .  History of stroke  09/23/2014   .  HLD (hyperlipidemia)  09/23/2014   .  Fall     .  Encounter for therapeutic drug monitoring  09/01/2013   .  Long term current use of anticoagulant therapy  11/14/2010   .  Congestive heart failure  04/18/2010   .  EDEMA  10/12/2008   .  Elevated lipids  10/08/2008   .  Essential hypertension  10/08/2008   .  Atrial fibrillation  10/08/2008   .  CVA  10/08/2008   .  Transient cerebral ischemia  10/08/2008     Expected Discharge Date: Expected Discharge Date: 10/08/14  Team Members Present: Physician leading conference: Dr. Alysia Penna Social Worker Present: Ovidio Kin, LCSW Nurse Present: Heather Roberts, RN PT Present: Cameron Sprang, PT OT Present: Benay Pillow, OT SLP Present: Windell Moulding, SLP PPS Coordinator present : Daiva Nakayama, RN, CRRN        Current Status/Progress  Goal  Weekly Team Focus   Medical     anemia, ABLA pectoral hematoma, CT otherwise unremarkable  Improve activity tolerance for  home d/c   family training, monitor CBC, restart warfarin    Bowel/Bladder     Continent of bowel during the day. Incontinent @hs . Continent of bowel. LBM 10/05/14  Managed bladder program  Decrease fluid intake after 7pm   Swallow/Nutrition/ Hydration       na         ADL's     supervision for grooming and bathing at shower level, Steady assist for all other ADLs and functional transfers overall   supervision overall  self care retraining, functional mobilty/transfers, pt/family education, discharge planning, and activity tolerance    Mobility     S for bed mobility, S for stand pivot transfers (except for from low surface), S for gait, min A stairs.   S overall (downgraded due to lack of progress)   activity tolerance, gait, therex, transfers, bed mobility, pt/family education.    Communication               Safety/Cognition/ Behavioral Observations      no unsafe behaviors         Pain     No c/o pain   <3  Monitor for nonverbal cues of pain. Offer pain medication 1hr prior to initial therapy session  Skin     Hematoma to L chest decreasing. BLE with discoloration with pitting edema R>L, ecchymosis to BUE. axillary, abdomen   No additonal skin breakdown  Assess for decrease bruising, Encourage turn q 2hrs       *See Care Plan and progress notes for long and short-term goals.    Barriers to Discharge:  see above     Possible Resolutions to Barriers:   extend stay by 3days to resolve above issues      Discharge Planning/Teaching Needs:   Home with wife who can be there with him-here for some therapies. Pt feeling better now and ready for therapies       Team Discussion: recovering from medical issues this past week. Downgraded to supervision level-feels better today and ready to go to therapies. Will need family education prior to discharge.   Revisions to Treatment Plan:    Downgraded goals to supervision level    Continued Need for Acute Rehabilitation Level of Care: The  patient requires daily medical management by a physician with specialized training in physical medicine and rehabilitation for the following conditions: Daily direction of a multidisciplinary physical rehabilitation program to ensure safe treatment while eliciting the highest outcome that is of practical value to the patient.: Yes Daily medical management of patient stability for increased activity during participation in an intensive rehabilitation regime.: Yes Daily analysis of laboratory values and/or radiology reports with any subsequent need for medication adjustment of medical intervention for : Post surgical problems;Other  [name] 10/07/2014, 8:45 AM                 [name], LCSW Social Worker Signed  Patient Care Conference 09/29/2014  3:41 PM    Expand All Collapse All   Inpatient RehabilitationTeam Conference and Plan of Care Update Date: 09/29/2014   Time: 2;00 PM     Patient Name: Aaron Frye       Medical Record Number: [id number]  Date of Birth: 08-Feb-1925 Sex: Male         Room/Bed: 4M04C/4M04C-01 Payor Info: Payor: MEDICARE / Plan: MEDICARE PART A AND B / Product Type: *No Product type* /    Admitting Diagnosis: FEMUR FX   Admit Date/Time:  09/27/2014  4:49 PM Admission Comments: No comment available   Primary Diagnosis:  Intertrochanteric fracture of right femur Principal Problem: Intertrochanteric fracture of right femur    Patient Active Problem List     Diagnosis  Date Noted   .  Leukocytosis  09/24/2014   .  Intertrochanteric fracture of right femur  09/24/2014   .  Hip fracture  09/23/2014   .  History of stroke  09/23/2014   .  HLD (hyperlipidemia)  09/23/2014   .  Fall     .  Encounter for therapeutic drug monitoring  09/01/2013   .  Long term current use of anticoagulant therapy  11/14/2010   .  Congestive heart failure  04/18/2010   .  EDEMA  10/12/2008   .  Elevated lipids  10/08/2008   .  Essential hypertension  10/08/2008    .  Atrial fibrillation  10/08/2008   .  CVA  10/08/2008   .  Transient cerebral ischemia  10/08/2008     Expected Discharge Date: Expected Discharge Date: 10/05/14  Team Members Present: Physician leading conference: Dr. Alger Simons Social Worker Present: Ovidio Kin, LCSW Nurse Present: Elliot Cousin, RN PT Present: Raylene Everts, Jimmie Molly, PT OT Present: Joellen Jersey  Abner Greenspan, OT PPS Coordinator present : Daiva Nakayama, RN, CRRN        Current Status/Progress  Goal  Weekly Team Focus   Medical     left hip fx. anemia, uti  improve pain control and activity tolerance   bp mgt, anemia rx   Bowel/Bladder     Continent to bowel and bladder.Wears depends.   To continue continent to bowel and bladder.   To monitor bladder and bowel function Q. shift.    Swallow/Nutrition/ Hydration       na         ADL's     min -mod A for self care, mod A toilet transfer, min A for toileting, decreased activity tolerance  Mod 1- supervision overall  self care retraining, functional mobility, functional transfers, pt/family education, activity tolerance   Mobility     min A overall for bed mobility, transfers, gait  mod I overall  activity tolerance, gait, therex, transfers, bed mobility, ptfamily education   Communication       na         Safety/Cognition/ Behavioral Observations       To keep pt. free from fall during his stay in rehab.       Pain     Intermitten pain on right leg.Uses 1 to 2 Vicodin PRN.  To keep pain levels less than 3,on scale 1 to 10.  To monitor pain levels Q 2 hrs. and PRN.   Skin     Incision on right leg dry and intact.   To keep skin free of pressure ulcers.   To monitor skin Q. shift.      *See Care Plan and progress notes for long and short-term goals.    Barriers to Discharge:  anemia, pain, ortho precautions     Possible Resolutions to Barriers:   pain mgt, blood txfusion, adaptive equipment      Discharge Planning/Teaching Needs:   Home with wife who  can provide light level of assist        Team Discussion:    Hemoglobin low-plans to give blood transfusion today.  Treating for UTI. Pt not feeling well and tired in therapies. Goals set at mod/i level.  Hope once feeling better will do well here.   Revisions to Treatment Plan:    None    Continued Need for Acute Rehabilitation Level of Care: The patient requires daily medical management by a physician with specialized training in physical medicine and rehabilitation for the following conditions: Daily direction of a multidisciplinary physical rehabilitation program to ensure safe treatment while eliciting the highest outcome that is of practical value to the patient.: Yes Daily medical management of patient stability for increased activity during participation in an intensive rehabilitation regime.: Yes Daily analysis of laboratory values and/or radiology reports with any subsequent need for medication adjustment of medical intervention for : Post surgical problems;Other  [name] 09/29/2014, 3:41 PM                  Patient ID: [name], male   DOB: [birth year], 81 y.o.   MRN: [id number]

## 2014-10-07 NOTE — Progress Notes (Signed)
Occupational Therapy Session Note  Patient Details  Name: Aaron Frye MRN: [id number] Date of Birth: [birth year]  Today's Date: 10/07/2014 OT Individual Time: 0258-5277 and 1425-1540 OT Individual Time Calculation (min): 60 min and 70 min    Short Term Goals: Week 1:  OT Short Term Goal 1 (Week 1): STGs= LTGs secondary to short estimated LOS  Skilled Therapeutic Interventions/Progress Updates:  Session 1: Upon entering the room, pt supine in bed with no c/o pain. Supine >sit with supervision and close supervision for ambulation with RW to bathroom for toileting. Toilet transfer with supervision onto elevated toilet seat. Pt having BM during session and performing clothing management and hygiene with close supervision while standing with RW. Pt performing steady assist transfer into shower for bathing. Pt requiring steady assist when standing to wash buttocks and holding onto grab bar. Pt returning to sit EOB for dressing tasks with use of AE. Pt requiring multiple rest breaks secondary to fatigue. Pt returning to recliner chair with breakfast tray placed in front of him. All needs within reach and call bell within reach.   Session 2: Upon entering the room, pt seated in recliner chair with wife present in room for family training/education this session. Pt with 2/10 c/o pain in R hip area and pt described as ache. OT discussed energy conservation handouts given to pt last week. Pt and caregiver verbalizing understanding of this information. Pt demonstrating pursed lip breathing for caregiver in room. OT recommended safety treads for shower floor in order to decrease fall risk with bathing. OT also recommended purchase of adaptive equipment kit with LH reacher, LH shoe horn, reacher, LH sponge, and sock aide in order to increase independence with self care tasks. OT educated and demonstrated proper assist and verbal cues for functional transfers, ambulation, and toileting. Caregiver assisting pt to  toilet with use of RW and close supervision. Pt ambulating in hallway to ADL apartment with step to pattern but adjusting to ambulating with normal gait pattern with min verbal cues. Pt returning to room with close supervision from wife after rest break for fatigue. OT educated pt and caregiver on expectations with continued home health occupational therapy. Questions asked as appropriate about discharge and this setting with pt and caregiver verbalizing understanding. Pt seated in recliner chair with call bell and all needed items within reach upon exiting the room.   Therapy Documentation Precautions:  Precautions Precautions: Fall Restrictions Weight Bearing Restrictions: Yes RLE Weight Bearing: Weight bearing as tolerated  See FIM for current functional status  Therapy/Group: Individual Therapy  Phineas Semen 10/07/2014, 12:22 PM

## 2014-10-07 NOTE — Progress Notes (Signed)
[facility] PHYSICAL MEDICINE & REHABILITATION     PROGRESS NOTE    Subjective/Complaints: No pain c/os.  Per team conf goals downgraded to sup due to balance   Review of Systems - Negative except tired, shallow respObjective: Vital Signs: Blood pressure 137/63, pulse 95, temperature 97.8 F (36.6 C), temperature source Oral, resp. rate 18, weight 78.5 kg (173 lb 1 oz), SpO2 97 %. No results found.  Recent Labs  10/05/14 0648 10/06/14 0546  WBC 25.2* 21.0*  HGB 9.3* 8.6*  HCT 29.2* 26.8*  PLT 333 304    Recent Labs  10/05/14 0910  NA 137  K 4.4  CL 101  GLUCOSE 224*  BUN 29*  CREATININE 0.87  CALCIUM 8.2*   CBG (last 3)  No results for input(s): GLUCAP in the last 72 hours.  Wt Readings from Last 3 Encounters:  10/07/14 78.5 kg (173 lb 1 oz)  09/27/14 84.959 kg (187 lb 4.8 oz)  08/24/14 81.194 kg (179 lb)    Physical Exam:  Constitutional: He is oriented to person, place, and time. He appears well-developed.  HENT:  Head: Normocephalic and atraumatic.  Eyes: Conjunctivae and EOM are normal. Pupils are equal, round, and reactive to light.  Neck: Normal range of motion. Neck supple. . No thyromegaly present.  Cardiovascular: Normal rate and regular rhythm.  No murmur heard. Cardiac rate controlled  Respiratory: shallow inspiration diminished BS bilateral , no wheezes GI: Soft. Bowel sounds are normal. He exhibits no distension.  Musculoskeletal: He exhibits edema. Tender along left pec major tendon Neurological: He is alert and oriented to person, place, and time.  Right leg 2/5 prox to 3+ distally. UE 5/5. LLE 4/5 hf,ke 5/5 ankle. No sensory deficits  Skin: bruising along left upper ribs to nearly axilla. Hip incisions are dressed and limb is appropriately tender. Chronic vascular changes in the distal legs.  Psychiatric: He has a normal mood and affect. His behavior is normal  Assessment/Plan: 1. Functional deficits secondary to right  intertrochanteric femur fracture/IMN which require 3+ hours per day of interdisciplinary therapy in a comprehensive inpatient rehab setting. Physiatrist is providing close team supervision and 24 hour management of active medical problems listed below. Physiatrist and rehab team continue to assess barriers to discharge/monitor patient progress toward functional and medical goals.  Team feels additional family training necessary with downgrade of goals to Sup     FIM: FIM - Bathing Bathing Steps Patient Completed: Chest, Right Arm, Left Arm, Abdomen, Front perineal area, Buttocks, Right upper leg, Left upper leg, Right lower leg (including foot), Left lower leg (including foot) Bathing: 5: Supervision: Safety issues/verbal cues  FIM - Upper Body Dressing/Undressing Upper body dressing/undressing steps patient completed: Thread/unthread right sleeve of pullover shirt/dresss, Thread/unthread left sleeve of pullover shirt/dress, Put head through opening of pull over shirt/dress, Pull shirt over trunk Upper body dressing/undressing: 5: Supervision: Safety issues/verbal cues FIM - Lower Body Dressing/Undressing Lower body dressing/undressing steps patient completed: Thread/unthread right pants leg, Thread/unthread left pants leg, Pull pants up/down, Don/Doff right sock, Don/Doff left sock Lower body dressing/undressing: 4: Steadying Assist  FIM - Toileting Toileting steps completed by patient: Adjust clothing prior to toileting, Performs perineal hygiene, Adjust clothing after toileting Toileting Assistive Devices: Grab bar or rail for support Toileting: 4: Steadying assist  FIM - Radio producer Devices: Elevated toilet seat, Insurance account manager Transfers: 5-To toilet/BSC: Supervision (verbal cues/safety issues), 5-From toilet/BSC: Supervision (verbal cues/safety issues)  FIM - Control and instrumentation engineer Devices:  Walker Bed/Chair Transfer: 5: Supine >  Sit: Supervision (verbal cues/safety issues), 5: Sit > Supine: Supervision (verbal cues/safety issues), 5: Bed > Chair or W/C: Supervision (verbal cues/safety issues), 5: Chair or W/C > Bed: Supervision (verbal cues/safety issues)  FIM - Locomotion: Wheelchair Locomotion: Wheelchair: 0: Activity did not occur FIM - Locomotion: Ambulation Locomotion: Ambulation Assistive Devices: Administrator Ambulation/Gait Assistance: 5: Supervision Locomotion: Ambulation: 2: Travels 50 - 149 ft with supervision/safety issues  Comprehension Comprehension Mode: Auditory Comprehension: 5-Follows basic conversation/direction: With extra time/assistive device  Expression Expression Mode: Verbal Expression: 3-Expresses basic 50 - 74% of the time/requires cueing 25 - 50% of the time. Needs to repeat parts of sentences.  Social Interaction Social Interaction: 4-Interacts appropriately 75 - 89% of the time - Needs redirection for appropriate language or to initiate interaction.  Problem Solving Problem Solving: 4-Solves basic 75 - 89% of the time/requires cueing 10 - 24% of the time  Memory Memory: 4-Recognizes or recalls 75 - 89% of the time/requires cueing 10 - 24% of the time  Medical Problem List and Plan: 1. Functional deficits secondary to right intertrochanteric femur fracture. Status post IM nailing 09/24/2014. Weightbearing as tolerated 2. DVT Prophylaxis/Anticoagulation: Chronic Coumadin therapy for atrial fibrillation. Intravenous heparin until INR therapeutic. , off heparin heparin INR >2 3. Pain Management: Hydrocodone. Monitor with increased mobility 4. Acute blood loss anemia. s/p-blood transfusion 2u  - latest hgb 8.6 on 4/6 -check stool for OB.   -serial CBC's 5. Neuropsych: This patient is capable of making decisions on his own behalf. 6. Skin/Wound Care: Routine skin checks 7. Fluids/Electrolytes/Nutrition: encourage increased PO intake. 8. Hypertension. Altace 5 mg daily,  Toprol-XL 50 mg daily. Monitor with increased mobility 9.  Leukocytosis - afeb, UA small leukocyte, 3-6 WBCs, afebrile, some tenderness around RIght thigh but wounds look good,monitor for now  10. Hyperlipidemia. Lipitor 11. Loose stools--resolved  -probiotic 12. CV:    -xopenex breathing treatment prn, suspect fluid overload post transfusion, ECHO with nl Ej Fx   13.  Pectoralis hematoma, large related to fall , diameter is decreasing, warfarin resumed (Days) 10 A FACE TO FACE EVALUATION WAS PERFORMED  Charlett Blake 10/07/2014 6:58 AM

## 2014-10-07 NOTE — Progress Notes (Signed)
Physical Therapy Session Note  Patient Details  Name: Aaron Frye MRN: [id number] Date of Birth: 12-11-1924  Today's Date: 10/07/2014 PT Individual Time: 0930-1030 PT Individual Time Calculation (min): 60 min   Short Term Goals: Week 1:  PT Short Term Goal 1 (Week 1): = LTG due ELOS  Skilled Therapeutic Interventions/Progress Updates:   Pt received sitting in recliner agreeable to therapy session.  States he had made frequent trips to restroom this morning and needed to go before leaving room. Pt stood from low recliner with min A with cues for increased forward weight shift and foot placement.  Ambulated to/from restroom with RW at S level with min cues for upright posture.  Performed all toileting tasks at S level.  Ambulated to sink and wash/dried hands as well as brushed teeth in standing x 5 mins at S level.  Returned to seated in order to doff current shorts as they were too big.  Assisted in donning new shorts and pt stood at S level to pull them up.  Ambulated full distance to therapy gym x 150' with RW at S level.  Continued intermittent cues for upright head, however note that stride length much improved as well as gait speed improved from previous sessions.  Performed seated nustep x 8 mins at level 4 progressing to 5 for last 3 mins resistance with BUE/LEs to increase overall strength, endurance and RLE ROM in hip.  Assisted pt back to room to perform bed mobility and bed level therex.  Performed bed mobility at S level with HOB flat and without rails to better simulate home.  Performed supine therex as follows; B quad sets x 10 reps w/ 5 sec hold, R LE assisted SLR x 10 reps and R hip abd x 10 reps.  Assisted back into recliner and left with all needs in reach.    Therapy Documentation Precautions:  Precautions Precautions: Fall Restrictions Weight Bearing Restrictions: Yes RLE Weight Bearing: Weight bearing as tolerated   Vital Signs: Therapy Vitals Temp: 97.8 F (36.6  C) Temp Source: Oral Pulse Rate: 95 Resp: 18 BP: 137/63 mmHg Patient Position (if appropriate): Lying Oxygen Therapy SpO2: 97 % O2 Device: Not Delivered Pain: Pt with mild c/o pain in R hip, better with increased movement/ambulation.    See FIM for current functional status  Therapy/Group: Individual Therapy  Denice Bors 10/07/2014, 10:04 AM

## 2014-10-07 NOTE — Progress Notes (Signed)
ANTICOAGULATION CONSULT NOTE - Follow Up Consult  Pharmacy Consult for coumadin Indication: atrial fibrillation  Allergies  Allergen Reactions  . Penicillins     REACTION: unspecified    Patient Measurements: Weight: 173 lb 1 oz (78.5 kg) Heparin Dosing Weight:   Vital Signs: Temp: 97.8 F (36.6 C) (04/07 0621) Temp Source: Oral (04/07 0621) BP: 137/63 mmHg (04/07 0621) Pulse Rate: 95 (04/07 0621)  Labs:  Recent Labs  10/05/14 0648 10/05/14 0910 10/06/14 0546  HGB 9.3*  --  8.6*  HCT 29.2*  --  26.8*  PLT 333  --  304  LABPROT 17.6*  --  17.1*  INR 1.43  --  1.37  CREATININE  --  0.87  --     Estimated Creatinine Clearance: 55.9 mL/min (by C-G formula based on Cr of 0.87).   Medications:  Scheduled:  . sodium chloride   Intravenous Once  . aspirin  81 mg Oral Daily  . atorvastatin  40 mg Oral Daily  . ferrous sulfate  325 mg Oral BID WC  . fluticasone  1 spray Each Nare Daily  . furosemide  20 mg Intravenous Q12H  . loratadine  10 mg Oral Daily  . metoprolol succinate  50 mg Oral Q breakfast  . ramipril  5 mg Oral Daily  . saccharomyces boulardii  250 mg Oral BID  . senna  2 tablet Oral QHS  . Warfarin - Pharmacist Dosing Inpatient   Does not apply q1800   Infusions:    Assessment: 81 yo male with afib is currently on subtherapeutic coumadin.  INR today is 1.26. Goal of Therapy:  INR 2-3 Monitor platelets by anticoagulation protocol: Yes   Plan:  - coumadin 5 mg po x1 - INR in am - monitor for bleeding and hematoma  Hermie Reagor, Tsz-Yin 10/07/2014,8:14 AM

## 2014-10-08 ENCOUNTER — Inpatient Hospital Stay (HOSPITAL_COMMUNITY): Payer: Medicare Other | Admitting: Occupational Therapy

## 2014-10-08 ENCOUNTER — Inpatient Hospital Stay (HOSPITAL_COMMUNITY): Payer: Medicare Other | Admitting: Rehabilitation

## 2014-10-08 ENCOUNTER — Encounter (HOSPITAL_COMMUNITY): Payer: Self-pay | Admitting: Orthopedic Surgery

## 2014-10-08 LAB — PROTIME-INR
INR: 1.31 (ref 0.00–1.49)
PROTHROMBIN TIME: 16.4 s — AB (ref 11.6–15.2)

## 2014-10-08 LAB — OCCULT BLOOD X 1 CARD TO LAB, STOOL: FECAL OCCULT BLD: NEGATIVE

## 2014-10-08 MED ORDER — LORATADINE 10 MG PO TABS
10.0000 mg | ORAL_TABLET | Freq: Every day | ORAL | Status: DC
Start: 2014-10-08 — End: 2019-04-04

## 2014-10-08 MED ORDER — ATORVASTATIN CALCIUM 40 MG PO TABS
40.0000 mg | ORAL_TABLET | Freq: Every day | ORAL | Status: DC
Start: 2014-10-08 — End: 2015-05-10

## 2014-10-08 MED ORDER — FUROSEMIDE 20 MG PO TABS
20.0000 mg | ORAL_TABLET | Freq: Two times a day (BID) | ORAL | Status: DC
  Administered 2014-10-08: 20 mg via ORAL
  Filled 2014-10-08 (×3): qty 1

## 2014-10-08 MED ORDER — FUROSEMIDE 20 MG PO TABS: 20.0000 mg | ORAL_TABLET | Freq: Two times a day (BID) | ORAL | Status: DC

## 2014-10-08 MED ORDER — RAMIPRIL 5 MG PO CAPS: 5.0000 mg | ORAL_CAPSULE | Freq: Every day | ORAL | Status: DC

## 2014-10-08 MED ORDER — FERROUS SULFATE 325 (65 FE) MG PO TABS: 325.0000 mg | ORAL_TABLET | Freq: Two times a day (BID) | ORAL | Status: DC

## 2014-10-08 MED ORDER — METHOCARBAMOL 500 MG PO TABS: 500.0000 mg | ORAL_TABLET | Freq: Four times a day (QID) | ORAL | Status: DC | PRN

## 2014-10-08 MED ORDER — POTASSIUM CHLORIDE CRYS ER 20 MEQ PO TBCR: 20.0000 meq | EXTENDED_RELEASE_TABLET | Freq: Every day | ORAL | Status: DC

## 2014-10-08 MED ORDER — POTASSIUM CHLORIDE CRYS ER 20 MEQ PO TBCR
20.0000 meq | EXTENDED_RELEASE_TABLET | Freq: Every day | ORAL | Status: DC
  Administered 2014-10-08: 20 meq via ORAL
  Filled 2014-10-08: qty 1

## 2014-10-08 MED ORDER — SACCHAROMYCES BOULARDII 250 MG PO CAPS: 250.0000 mg | ORAL_CAPSULE | Freq: Two times a day (BID) | ORAL | Status: DC

## 2014-10-08 MED ORDER — WARFARIN SODIUM 5 MG PO TABS: 2.5000 mg | ORAL_TABLET | ORAL | Status: DC

## 2014-10-08 MED ORDER — HYDROCODONE-ACETAMINOPHEN 5-325 MG PO TABS: 1.0000 | ORAL_TABLET | Freq: Four times a day (QID) | ORAL | Status: DC | PRN

## 2014-10-08 MED ORDER — WARFARIN SODIUM 5 MG PO TABS
5.0000 mg | ORAL_TABLET | Freq: Once | ORAL | Status: DC
  Filled 2014-10-08: qty 1

## 2014-10-08 MED ORDER — METOPROLOL SUCCINATE ER 100 MG PO TB24: 50.0000 mg | ORAL_TABLET | Freq: Every day | ORAL | Status: DC

## 2014-10-08 NOTE — Progress Notes (Signed)
Physical Therapy Discharge Summary  Patient Details  Name: Aaron Frye MRN: [id number] Date of Birth: [birth year]  Today's Date: 10/08/2014 PT Individual Time: 0930-1030 PT Individual Time Calculation (min): 60 min    Patient has met 7 of 7 long term goals due to improved activity tolerance, improved balance, increased strength, increased range of motion, decreased pain and ability to compensate for deficits.  Patient to discharge at an ambulatory level Supervision.   Patient's care partner is independent to provide the necessary cognitive assistance at discharge.  Reasons goals not met: n/a  Recommendation:  Patient will benefit from ongoing skilled PT services in home health setting to continue to advance safe functional mobility, address ongoing impairments in decreased balance, decreased endurance, and minimize fall risk.  Equipment: RW and bedside commode  Reasons for discharge: treatment goals met and discharge from hospital  Patient/family agrees with progress made and goals achieved: Yes   PT Treatment/Interventon:  Pt received sitting in recliner in room, agreeable to therapy session.  Ambulated >150' throughout session in controlled and home environment with RW at S level with continued cues for upright posture and safety when turning to sit.  Once in ortho gym, performed car transfer at min/guard level but was able to self assist RLE into and out of car.  Performed 6, 6" steps with L rail to simulate community re-entry. Performed at min A level with cues for sequencing and technique, however performed with marked improvement today.  Ambulated to ADL apt for gait at S level with RW, as well as bed mobility at S level.  Again marked improvement in moving RLE into/out of bed today.  Ambulated to therapy gym to perform gait up/down ramp for home entry as well as curb step to simulate community.   Performed all at S level with mod cues for sequencing with curb step.  Pt stating  needing to use restroom, therefore ambulated back to room to restroom.  Pt successful in voiding urine, but had been somewhat incontinent in brief as well.  Assisted with donning new gown at [place] assist level.  Wife in during this part of session, therefore continued to provide education as to ambulating with pt, providing cues for posture and safety when sitting/standing.  Also educated on use of w/c in community, how to work leg rests and how to fold up w/c.  Pt left in w/c in room with all needs in reach.     PT Discharge Precautions/Restrictions Precautions Precautions: Fall Restrictions Weight Bearing Restrictions: No RLE Weight Bearing: Weight bearing as tolerated Vital Signs Therapy Vitals Temp: 97.9 F (36.6 C) Temp Source: Oral Pulse Rate: 84 Resp: 18 BP: 127/68 mmHg Patient Position (if appropriate): Lying Oxygen Therapy SpO2: 98 % O2 Device: Not Delivered Pain Pain Assessment Pain Assessment: 0-10 Pain Score: 2  Pain Type: Acute pain Pain Location: Hip Pain Orientation: Right Pain Descriptors / Indicators: Aching Multiple Pain Sites: No Vision/Perception   See OT note Cognition Overall Cognitive Status: Within Functional Limits for tasks assessed Arousal/Alertness: Awake/alert Orientation Level: Oriented X4 Alternating Attention: Appears intact Memory: Impaired Memory Impairment: Decreased recall of new information Awareness: Appears intact Problem Solving: Appears intact Safety/Judgment: Appears intact Sensation Sensation Light Touch: Appears Intact Stereognosis: Not tested Hot/Cold: Not tested Proprioception: Appears Intact Coordination Gross Motor Movements are Fluid and Coordinated: No Fine Motor Movements are Fluid and Coordinated: No Coordination and Movement Description: decreaed coordination in RLE due to strength deficits. Heel Shin Test: unable to perform due to strength  deficits and pain Motor  Motor Motor: Within Functional  Limits Motor - Discharge Observations: Generalized weakness and decreased balance  Mobility Bed Mobility Bed Mobility: Supine to Sit;Sit to Supine Supine to Sit: 5: Supervision Supine to Sit Details: Verbal cues for sequencing;Verbal cues for technique Sit to Supine: 5: Supervision Sit to Supine - Details: Verbal cues for sequencing;Verbal cues for technique Transfers Transfers: Yes Sit to Stand: 5: Supervision Sit to Stand Details: Verbal cues for sequencing;Verbal cues for technique;Verbal cues for precautions/safety Stand to Sit: 5: Supervision Stand to Sit Details (indicate cue type and reason): Verbal cues for sequencing;Verbal cues for technique;Verbal cues for precautions/safety Stand Pivot Transfers: 5: Supervision Stand Pivot Transfer Details: Verbal cues for sequencing;Verbal cues for technique;Verbal cues for precautions/safety Locomotion  Ambulation Ambulation: Yes Ambulation/Gait Assistance: 5: Supervision Ambulation Distance (Feet): 150 Feet Assistive device: Rolling walker Ambulation/Gait Assistance Details: Verbal cues for sequencing;Verbal cues for technique;Verbal cues for precautions/safety Gait Gait: Yes Gait Pattern: Impaired Gait Pattern: Step-to pattern;Step-through pattern;Decreased step length - left;Decreased stance time - right;Decreased hip/knee flexion - right;Antalgic Stairs / Additional Locomotion Stairs: Yes Stairs Assistance: 4: Min assist Stairs Assistance Details: Verbal cues for sequencing;Verbal cues for technique;Verbal cues for precautions/safety;Manual facilitation for weight shifting Stair Management Technique: One rail Left;Step to pattern;Forwards Number of Stairs: 6 Height of Stairs: 6 Ramp: 5: Supervision Wheelchair Mobility Wheelchair Mobility: No (pt ambulatory)  Trunk/Postural Assessment  Cervical Assessment Cervical Assessment: Exceptions to [facility] Cervical Strength Overall Cervical Strength Comments: forward flexed cervical  spine at all times Thoracic Assessment Thoracic Assessment: Exceptions to [facility] Thoracic Strength Overall Thoracic Strength Comments: forward shoulders, kyphotic posture Lumbar Assessment Lumbar Assessment: Exceptions to [facility] Lumbar Strength Overall Lumbar Strength Comments: posterior pelvic tilt Postural Control Postural Control: Deficits on evaluation Postural Limitations: Decreased balance with tendency to lose balance posteriorly  Balance Balance Balance Assessed: Yes Dynamic Sitting Balance Dynamic Sitting - Balance Support: Feet supported Dynamic Sitting - Level of Assistance: 5: Stand by assistance Dynamic Standing Balance Dynamic Standing - Balance Support: During functional activity Dynamic Standing - Level of Assistance: 5: Stand by assistance Dynamic Standing - Balance Activities: Lateral lean/weight shifting;Forward lean/weight shifting Extremity Assessment      RLE Assessment RLE Assessment: Exceptions to [facility] RLE AROM (degrees) Overall AROM Right Lower Extremity: Deficits;Due to pain;Due to decreased strength RLE PROM (degrees) Overall PROM Right Lower Extremity: Deficits RLE Strength RLE Overall Strength: Deficits RLE Overall Strength Comments: all 4/5, except for hip flex 3/5 LLE Assessment LLE Assessment: Within Functional Limits  See FIM for current functional status  Denice Bors 10/08/2014, 9:49 AM

## 2014-10-08 NOTE — Progress Notes (Signed)
Occupational Therapy Discharge Summary  Patient Details  Name: Aaron Frye MRN: [id number] Date of Birth: 1925-05-04  Today's Date: 10/08/2014 OT Individual Time: 3154-0086 OT Individual Time Calculation (min): 58 min    Session Note: Pt performed shower and dressing during session with wife present.  Practiced simulated shower transfer by stepping over rolled up towels to simulate shower edge at home.  Pt able to perform using the RW and stepping over backwards with close supervision.  He was able to complete all bathing and dressing with supervision but needs use of all AE including reacher, LH sponge, LH shoe horn, and sockaide.  Pt's wife voices understanding of how to safely assist him with all transfers and selfcare as well as places that AE can be purchased.   Patient has met 8 of 9 long term goals due to improved activity tolerance, improved balance and ability to compensate for deficits.  Patient to discharge at overall Supervision level.  Patient's care partner is independent to provide the necessary physical and cognitive assistance at discharge.    Reasons goals not met: Pt was not able to perform home management task at supervision level  Recommendation:  Patient will benefit from ongoing skilled OT services in home health setting to continue to advance functional skills in the area of BADL.  Feel pt will benefit from continued OT to further progress to a modified independent level for basic selfcare tasks.  Pt's wife has been educated on DME and AE needs as well as safe techniques to assist Aaron Frye with his ADLs.     Equipment: 3:1  Reasons for discharge: treatment goals met and discharge from hospital  Patient/family agrees with progress made and goals achieved: Yes  OT Discharge Precautions/Restrictions  Precautions Precautions: Fall Restrictions Weight Bearing Restrictions: No RLE Weight Bearing: Weight bearing as tolerated  Pain Pain Assessment Pain  Assessment: Faces Pain Score: 2  Faces Pain Scale: Hurts a little bit Pain Type: Acute pain Pain Location: Hip Pain Orientation: Right Pain Descriptors / Indicators: Aching Pain Intervention(s): Medication (See eMAR);Repositioned Multiple Pain Sites: No ADL  See FIM scale for details  Vision/Perception  Vision- History Baseline Vision/History: Wears glasses Wears Glasses: Reading only Patient Visual Report: No change from baseline Vision- Assessment Vision Assessment?: No apparent visual deficits  Cognition Overall Cognitive Status: Within Functional Limits for tasks assessed Arousal/Alertness: Awake/alert Orientation Level: Oriented X4 Alternating Attention: Appears intact Memory: Impaired Memory Impairment: Decreased recall of new information Sensation Sensation Light Touch: Appears Intact Stereognosis: Appears Intact Hot/Cold: Appears Intact Proprioception: Appears Intact Coordination Gross Motor Movements are Fluid and Coordinated: Yes (In UEs coordination WFLs) Fine Motor Movements are Fluid and Coordinated: Yes Motor  Motor Motor: Within Functional Limits Motor - Discharge Observations: Generalized weakness and decreased balance Mobility  Transfers Transfers: Sit to Stand;Stand to Sit Sit to Stand: 5: Supervision Sit to Stand Details: Verbal cues for sequencing;Verbal cues for technique;Verbal cues for precautions/safety Stand to Sit: 5: Supervision Stand to Sit Details (indicate cue type and reason): Verbal cues for sequencing;Verbal cues for technique;Verbal cues for precautions/safety  Trunk/Postural Assessment  Cervical Assessment Cervical Assessment: Exceptions to [facility] Cervical Strength Overall Cervical Strength Comments: forward flexed cervical spine at all times Thoracic Assessment Thoracic Assessment: Exceptions to [facility] Thoracic Strength Overall Thoracic Strength Comments: forward shoulders, kyphotic posture Lumbar Assessment Lumbar Assessment:  Exceptions to [facility] Lumbar Strength Overall Lumbar Strength Comments: posterior pelvic tilt, slight lordosis Postural Control Postural Limitations: Decreased balance with tendency to lose balance posteriorly  Balance Balance Balance Assessed: Yes Dynamic Sitting Balance Dynamic Sitting - Level of Assistance: 5: Stand by assistance Dynamic Standing Balance Dynamic Standing - Balance Support: During functional activity Dynamic Standing - Level of Assistance: 5: Stand by assistance Dynamic Standing - Balance Activities: Other (comment) (LB selfcare) Extremity/Trunk Assessment RUE Assessment RUE Assessment: Within Functional Limits LUE Assessment LUE Assessment: Within Functional Limits  See FIM for current functional status  Aaron Frye OTR/L 10/08/2014, 1:18 PM

## 2014-10-08 NOTE — Progress Notes (Signed)
Social Work Discharge Note Discharge Note  The overall goal for the admission was met for:   Discharge location: Yes-HOME WITH WIFE WHO WILL PROVIDE 24 HR SUPERVISION   Length of Stay: Yes-11 DAYS  Discharge activity level: Yes-SUPERVISION LEVEL  Home/community participation: Yes  Services provided included: MD, RD, PT, OT, RN, CM, TR, Pharmacy and SW  Financial Services: Medicare and Private Insurance: [place]  Follow-up services arranged: Home Health: [facility]-PT,OT,RN, DME: [place] and Patient/Family request agency [facility]: [facility], [facility]: [facility]  Comments (or additional information):WIFE HERE MORNING OF DISCHARGE TO [facility] TO SEE WHAT HE WILL NEED ASSISTANCE WITH  Patient/Family verbalized understanding of follow-up arrangements: Yes  Individual responsible for coordination of the follow-up plan: PATIENT & MARTHA-WIFE  Confirmed correct DME delivered: [name] 10/08/2014    [name]

## 2014-10-08 NOTE — Progress Notes (Signed)
[facility]     PROGRESS NOTE    Subjective/Complaints: Pt feels ok, voice hoarse since surgery, no throat pain   Review of Systems - Negative except tired, shallow respObjective: Vital Signs: Blood pressure 127/68, pulse 84, temperature 97.9 F (36.6 C), temperature source Oral, resp. rate 18, weight 78 kg (171 lb 15.3 oz), SpO2 98 %. No results found.  Recent Labs  10/06/14 0546 10/07/14 0759  WBC 21.0* 20.7*  HGB 8.6* 9.7*  HCT 26.8* 31.1*  PLT 304 372    Recent Labs  10/05/14 0910  NA 137  K 4.4  CL 101  GLUCOSE 224*  BUN 29*  CREATININE 0.87  CALCIUM 8.2*   CBG (last 3)  No results for input(s): GLUCAP in the last 72 hours.  Wt Readings from Last 3 Encounters:  10/08/14 78 kg (171 lb 15.3 oz)  09/27/14 84.959 kg (187 lb 4.8 oz)  08/24/14 81.194 kg (179 lb)    Physical Exam:  Constitutional: He is oriented to person, place, and time. He appears well-developed.  HENT:  Head: Normocephalic and atraumatic.  Eyes: Conjunctivae and EOM are normal. Pupils are equal, round, and reactive to light.  Neck: Normal range of motion. Neck supple. . No thyromegaly present.  Cardiovascular: Normal rate and regular rhythm.  No murmur heard. Cardiac rate controlled  Respiratory: shallow inspiration diminished BS bilateral , no wheezes GI: Soft. Bowel sounds are normal. He exhibits no distension.  Musculoskeletal: He exhibits edema. Tender along left pec major tendon Neurological: He is alert and oriented to person, place, and time.  Right leg 2/5 prox to 3+ distally. UE 5/5. LLE 4/5 hf,ke 5/5 ankle. No sensory deficits  Skin: bruising along left upper ribs to nearly axilla. Hip incisions are dressed and limb is appropriately tender. Chronic vascular changes in the distal legs.  Psychiatric: He has a normal mood and affect. His behavior is normal  Assessment/Plan: 1. Functional deficits secondary to right intertrochanteric  femur fracture/IMN  Stable for D/C today F/u PCP in 1-2 weeks, repeat CBC 1 wk, Hgb improving WBC trending down but needs repeat  ?vocal cord trauma during intubation may need ENT f/u if this does not resolve See D/C summary See D/C instructions    FIM: FIM - Bathing Bathing Steps Patient Completed: Chest, Right Arm, Left Arm, Abdomen, Front perineal area, Buttocks, Right upper leg, Left upper leg, Right lower leg (including foot), Left lower leg (including foot) Bathing: 5: Supervision: Safety issues/verbal cues  FIM - Upper Body Dressing/Undressing Upper body dressing/undressing steps patient completed: Thread/unthread right sleeve of pullover shirt/dresss, Thread/unthread left sleeve of pullover shirt/dress, Put head through opening of pull over shirt/dress, Pull shirt over trunk Upper body dressing/undressing: 5: Supervision: Safety issues/verbal cues FIM - Lower Body Dressing/Undressing Lower body dressing/undressing steps patient completed: Thread/unthread right pants leg, Thread/unthread left pants leg, Pull pants up/down, Don/Doff right sock, Don/Doff left sock Lower body dressing/undressing: 4: Steadying Assist  FIM - Toileting Toileting steps completed by patient: Adjust clothing prior to toileting, Performs perineal hygiene, Adjust clothing after toileting Toileting Assistive Devices: Grab bar or rail for support Toileting: 5: Supervision: Safety issues/verbal cues  FIM - Radio producer Devices: Elevated toilet seat, Insurance account manager Transfers: 5-To toilet/BSC: Supervision (verbal cues/safety issues), 5-From toilet/BSC: Supervision (verbal cues/safety issues)  FIM - Control and instrumentation engineer Devices: Copy: 5: Supine > Sit: Supervision (verbal cues/safety issues), 5: Sit > Supine: Supervision (verbal cues/safety issues), 5: Bed >  Chair or W/C: Supervision (verbal cues/safety issues), 5: Chair or W/C > Bed:  Supervision (verbal cues/safety issues)  FIM - Locomotion: Wheelchair Locomotion: Wheelchair: 0: Activity did not occur (pt ambulatory) FIM - Locomotion: Ambulation Locomotion: Ambulation Assistive Devices: Administrator Ambulation/Gait Assistance: 5: Supervision Locomotion: Ambulation: 5: Travels 150 ft or more with supervision/safety issues  Comprehension Comprehension Mode: Auditory Comprehension: 5-Follows basic conversation/direction: With extra time/assistive device  Expression Expression Mode: Verbal Expression: 3-Expresses basic 50 - 74% of the time/requires cueing 25 - 50% of the time. Needs to repeat parts of sentences.  Social Interaction Social Interaction: 4-Interacts appropriately 75 - 89% of the time - Needs redirection for appropriate language or to initiate interaction.  Problem Solving Problem Solving: 4-Solves basic 75 - 89% of the time/requires cueing 10 - 24% of the time  Memory Memory: 4-Recognizes or recalls 75 - 89% of the time/requires cueing 10 - 24% of the time  Medical Problem List and Plan: 1. Functional deficits secondary to right intertrochanteric femur fracture. Status post IM nailing 09/24/2014. Weightbearing as tolerated 2. DVT Prophylaxis/Anticoagulation: Chronic Coumadin therapy for atrial fibrillation. Intravenous heparin until INR therapeutic. , off heparin heparin INR >2 3. Pain Management: Hydrocodone. Monitor with increased mobility 4. Acute blood loss anemia. s/p-blood transfusion 2u  - latest hgb 8.6 on 4/6 -check stool for OB.   -serial CBC's 5. Neuropsych: This patient is capable of making decisions on his own behalf. 6. Skin/Wound Care: Routine skin checks 7. Fluids/Electrolytes/Nutrition: encourage increased PO intake. 8. Hypertension. Altace 5 mg daily, Toprol-XL 50 mg daily. Monitor with increased mobility 9.  Leukocytosis - Trending down, no source of infx noted   10. Hyperlipidemia. Lipitor 11. Loose  stools--resolved  -probiotic 12. CV:    -xopenex breathing treatment prn, suspect fluid overload post transfusion, ECHO with nl Ej Fx   13.  Pectoralis hematoma, large related to fall , diameter is decreasing, warfarin resumed (Days) 11 A FACE TO FACE EVALUATION WAS PERFORMED  Aaron Frye E 10/08/2014 7:16 AM

## 2014-10-08 NOTE — Plan of Care (Signed)
Problem: RH Other (Specify) Goal: RH LTG Other (Specify) Outcome: Not Met (add Reason) Did not perform at supervision level.

## 2014-10-08 NOTE — Progress Notes (Signed)
ANTICOAGULATION CONSULT NOTE - Follow Up Consult  Pharmacy Consult for coumadin Indication: atrial fibrillation  Allergies  Allergen Reactions  . Penicillins     REACTION: unspecified    Patient Measurements: Weight: 171 lb 15.3 oz (78 kg) Heparin Dosing Weight:   Vital Signs: Temp: 97.9 F (36.6 C) (04/08 0557) Temp Source: Oral (04/08 0557) BP: 127/68 mmHg (04/08 0557) Pulse Rate: 84 (04/08 0557)  Labs:  Recent Labs  10/05/14 0910 10/06/14 0546 10/07/14 0759 10/08/14 0545  HGB  --  8.6* 9.7*  --   HCT  --  26.8* 31.1*  --   PLT  --  304 372  --   LABPROT  --  17.1* 16.0* 16.4*  INR  --  1.37 1.26 1.31  CREATININE 0.87  --   --   --     Estimated Creatinine Clearance: 55.9 mL/min (by C-G formula based on Cr of 0.87).   Medications:  Scheduled:  . sodium chloride   Intravenous Once  . aspirin  81 mg Oral Daily  . atorvastatin  40 mg Oral Daily  . ferrous sulfate  325 mg Oral BID WC  . fluticasone  1 spray Each Nare Daily  . furosemide  20 mg Oral BID  . loratadine  10 mg Oral Daily  . metoprolol succinate  50 mg Oral Q breakfast  . potassium chloride  20 mEq Oral Daily  . ramipril  5 mg Oral Daily  . saccharomyces boulardii  250 mg Oral BID  . senna  2 tablet Oral QHS  . Warfarin - Pharmacist Dosing Inpatient   Does not apply q1800   Infusions:    Assessment: 81 yo male with afib is currently on subtherapeutic coumadin.  INR is up to 1.31.  Coumadin just resumed on 03/06.  Goal of Therapy:  INR 2-3 Monitor platelets by anticoagulation protocol: Yes   Plan:  - coumadin 5 mg po x1 (no bridge per team's request); if not big movement tom, will need higher dose - INR in am Watch Hgb, FOB, hematuria, hematoma Adaiah Jaskot, Tsz-Yin 10/08/2014,8:14 AM

## 2014-10-08 NOTE — Progress Notes (Signed)
Pt. Got d/c instructions,prescriptions,RN went over medications with pt's wife and print an MAR copy for her.Pt. Is ready to go home with wife.

## 2014-10-15 ENCOUNTER — Encounter (INDEPENDENT_AMBULATORY_CARE_PROVIDER_SITE_OTHER): Payer: Medicare Other | Admitting: Ophthalmology

## 2014-10-20 DIAGNOSIS — I4891 Unspecified atrial fibrillation: Secondary | ICD-10-CM | POA: Diagnosis not present

## 2014-10-20 DIAGNOSIS — Z8673 Personal history of transient ischemic attack (TIA), and cerebral infarction without residual deficits: Secondary | ICD-10-CM

## 2014-10-20 DIAGNOSIS — Z8546 Personal history of malignant neoplasm of prostate: Secondary | ICD-10-CM

## 2014-10-20 DIAGNOSIS — W19XXXD Unspecified fall, subsequent encounter: Secondary | ICD-10-CM

## 2014-10-20 DIAGNOSIS — S72141D Displaced intertrochanteric fracture of right femur, subsequent encounter for closed fracture with routine healing: Secondary | ICD-10-CM

## 2014-10-20 DIAGNOSIS — N390 Urinary tract infection, site not specified: Secondary | ICD-10-CM

## 2014-10-20 DIAGNOSIS — Z7901 Long term (current) use of anticoagulants: Secondary | ICD-10-CM

## 2014-10-20 DIAGNOSIS — I5020 Unspecified systolic (congestive) heart failure: Secondary | ICD-10-CM

## 2014-10-20 DIAGNOSIS — I10 Essential (primary) hypertension: Secondary | ICD-10-CM

## 2014-10-20 DIAGNOSIS — B9620 Unspecified Escherichia coli [E. coli] as the cause of diseases classified elsewhere: Secondary | ICD-10-CM

## 2014-10-21 ENCOUNTER — Encounter (INDEPENDENT_AMBULATORY_CARE_PROVIDER_SITE_OTHER): Payer: Medicare Other | Admitting: Ophthalmology

## 2014-10-25 ENCOUNTER — Telehealth: Payer: Self-pay | Admitting: Cardiovascular Disease

## 2014-10-25 NOTE — Telephone Encounter (Signed)
New message     Wife is unable to weigh him daily by herself   Pt C/O Shortness Of Breath: STAT if SOB developed within the last 24 hours or pt is noticeably SOB on the phone  1. Are you currently SOB (can you hear that pt is SOB on the phone)? Home health nurse is not at the home now   2. How long have you been experiencing SOB? Patient been  sob - now bothersome.    3. Are you SOB when sitting or when up moving around? Moving around   4. Are you currently experiencing any other symptoms? No .   Vital sign are good.   Physical therapy stating oxygen level drop when ambulate in the low 90 .

## 2014-10-25 NOTE — Telephone Encounter (Signed)
lmtcb 4/25@8 :45am

## 2014-10-26 ENCOUNTER — Ambulatory Visit (INDEPENDENT_AMBULATORY_CARE_PROVIDER_SITE_OTHER): Payer: Medicare Other | Admitting: Cardiovascular Disease

## 2014-10-26 DIAGNOSIS — I4891 Unspecified atrial fibrillation: Secondary | ICD-10-CM

## 2014-10-26 DIAGNOSIS — Z5181 Encounter for therapeutic drug level monitoring: Secondary | ICD-10-CM

## 2014-10-26 LAB — POCT INR: INR: 1.7

## 2014-10-26 NOTE — Telephone Encounter (Signed)
Follow up      Pt c/o BP issue: STAT if pt c/o blurred vision, one-sided weakness or slurred speech  1. What are your last 5 BP readings?  82/52  And pulse 84  2. Are you having any other symptoms (ex. Dizziness, headache, blurred vision, passed out)?  Dizzy when he stands up, sometimes sob with activity 3. What is your BP issue?  bp is low

## 2014-10-27 NOTE — Telephone Encounter (Signed)
LM TO CALL BACK ./CY 

## 2014-10-27 NOTE — Telephone Encounter (Signed)
Decrease lasix to once/day Get BMET  F/u primary just had hip fracture

## 2014-10-27 NOTE — Telephone Encounter (Signed)
SPOKE [facility]  IS CURRENTLY  TAKING METOPROLOL 50 MG EVERY DAY  ALTACE 5 MG EVERY DAY AND LASIX 20 MG TWICE  DAILY B/P  HAS  BEEN LOW FOR  2 DAYS  WILL FORWARD TO  DR Johnsie Cancel FOR REVIEW .Adonis Housekeeper

## 2014-10-28 NOTE — Telephone Encounter (Signed)
[place]  ALSO  NOTIFIED .Adonis Housekeeper

## 2014-10-28 NOTE — Telephone Encounter (Signed)
Follow up ° ° ° ° ° °Returning Christine's call °

## 2014-10-28 NOTE — Telephone Encounter (Signed)
PT'S WIFE  AWARE .Adonis Housekeeper

## 2014-11-01 ENCOUNTER — Encounter (INDEPENDENT_AMBULATORY_CARE_PROVIDER_SITE_OTHER): Payer: Medicare Other | Admitting: Ophthalmology

## 2014-11-02 ENCOUNTER — Ambulatory Visit (INDEPENDENT_AMBULATORY_CARE_PROVIDER_SITE_OTHER): Payer: Medicare Other | Admitting: Cardiology

## 2014-11-02 DIAGNOSIS — Z5181 Encounter for therapeutic drug level monitoring: Secondary | ICD-10-CM

## 2014-11-02 DIAGNOSIS — I4891 Unspecified atrial fibrillation: Secondary | ICD-10-CM

## 2014-11-02 LAB — POCT INR: INR: 1.7

## 2014-11-08 ENCOUNTER — Ambulatory Visit (INDEPENDENT_AMBULATORY_CARE_PROVIDER_SITE_OTHER): Payer: Medicare Other | Admitting: Cardiovascular Disease

## 2014-11-08 ENCOUNTER — Encounter: Payer: Self-pay | Admitting: Cardiovascular Disease

## 2014-11-08 VITALS — BP 120/70 | HR 82 | Ht 67.0 in | Wt 164.4 lb

## 2014-11-08 DIAGNOSIS — R609 Edema, unspecified: Secondary | ICD-10-CM | POA: Diagnosis not present

## 2014-11-08 DIAGNOSIS — S199XXD Unspecified injury of neck, subsequent encounter: Secondary | ICD-10-CM

## 2014-11-08 DIAGNOSIS — I639 Cerebral infarction, unspecified: Secondary | ICD-10-CM | POA: Diagnosis not present

## 2014-11-08 DIAGNOSIS — R0600 Dyspnea, unspecified: Secondary | ICD-10-CM

## 2014-11-08 DIAGNOSIS — Z79899 Other long term (current) drug therapy: Secondary | ICD-10-CM

## 2014-11-08 LAB — BASIC METABOLIC PANEL
BUN: 18 mg/dL (ref 6–23)
CO2: 30 meq/L (ref 19–32)
CREATININE: 0.7 mg/dL (ref 0.40–1.50)
Calcium: 9.4 mg/dL (ref 8.4–10.5)
Chloride: 100 mEq/L (ref 96–112)
GFR: 113.12 mL/min (ref >60.00)
Glucose, Bld: 97 mg/dL (ref 70–99)
Potassium: 4.5 mEq/L (ref 3.5–5.1)
SODIUM: 135 meq/L (ref 135–145)

## 2014-11-08 LAB — BRAIN NATRIURETIC PEPTIDE: Pro B Natriuretic peptide (BNP): 152 pg/mL — ABNORMAL HIGH (ref 0.0–100.0)

## 2014-11-08 NOTE — Progress Notes (Signed)
Patient ID: Aaron Frye, male   DOB: 1925/03/06, 81 y.o.   MRN: [id number]   Aaron Frye is seen today for f/U of HTN, chronic afib and coumadin therapy. Recent rehab stay after broken hip   His coumadin dose has been decreased a bit. He has not had any bleeding porblems and sees the clinic monthly. His rate contol has been fine with no palpitations, SSCP  Some post op dyspnea . His EF has been in the 35-40% range. He has had no congestive symptoms. 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 He is taking crestor for his cholesterol. Samples given   Slipped at home and broke right hip.  Still doing rehab/PT  Had more dyspnea and LE edema.  ON lasix recently cut back to daily  Has had hoarseness since surgery    ROS: Denies fever, malais, weight loss, blurry vision, decreased visual acuity, cough, sputum, SOB, hemoptysis, pleuritic pain, palpitaitons, heartburn, abdominal pain, melena, lower extremity edema, claudication, or rash.  All other systems reviewed and negative  General: Affect appropriate Elderly white male  HEENT: hoarse with possible vocal chord injury  Neck supple with no adenopathy JVP normal no bruits no thyromegaly Lungs clear with no wheezing and good diaphragmatic motion Heart:  S1/S2 no murmur, no rub, gallop or click PMI normal Abdomen: benighn, BS positve, no tenderness, no AAA no bruit.  No HSM or HJR Distal pulses intact with no bruits No edema Neuro non-focal Skin warm and dry No muscular weakness   Current Outpatient Prescriptions  Medication Sig Dispense Refill  . aspirin 81 MG tablet Take 81 mg by mouth daily.      [name] atorvastatin (LIPITOR) 40 MG tablet Take 1 tablet (40 mg total) by mouth daily. 90 tablet 0  . ferrous sulfate 325 (65 FE) MG tablet Take 1 tablet (325 mg total) by mouth 2 (two) times daily with a meal. 60 tablet 3  . furosemide (LASIX) 20 MG  tablet Take 1 tablet (20 mg total) by mouth 2 (two) times daily. 60 tablet 1  . HYDROcodone-acetaminophen (NORCO/VICODIN) 5-325 MG per tablet Take 1-2 tablets by mouth every 6 (six) hours as needed for moderate pain. 90 tablet 0  . loratadine (CLARITIN) 10 MG tablet Take 1 tablet (10 mg total) by mouth daily. 30 tablet 1  . methocarbamol (ROBAXIN) 500 MG tablet Take 1 tablet (500 mg total) by mouth every 6 (six) hours as needed for muscle spasms (qid prn). 90 tablet 0  . metoprolol succinate (TOPROL-XL) 100 MG 24 hr tablet Take 1 tablet (100 mg total) by mouth daily. Take with or immediately following a meal. 30 tablet 1  . potassium chloride SA (K-DUR,KLOR-CON) 20 MEQ tablet Take 1 tablet (20 mEq total) by mouth daily. 30 tablet 1  . ramipril (ALTACE) 5 MG capsule Take 1 capsule (5 mg total) by mouth daily. 30 capsule 5  . saccharomyces boulardii (FLORASTOR) 250 MG capsule Take 1 capsule (250 mg total) by mouth 2 (two) times daily. 60 capsule 1  . warfarin (COUMADIN) 5 MG tablet Take 0.5-1 tablets (2.5-5 mg total) by mouth See admin instructions. Take 5mg  every day except on Tuesday take 2.5mg  per patient 30 tablet 1   No current facility-administered medications for this visit.    Allergies  Penicillins  Electrocardiogram:  afib rate 78 otherwise normal 7/15   Assessment and Plan Afib:  Chronic good rate control and anticoagulation Dsypnea:  Improved ? CHF  Lungs clear  Doubt PE on Rx anticoagulation Edema:  Check BMET increase lasix to 20 bid   Orhto: s/p fractured right hip continue PT/OT    Jenkins Rouge

## 2014-11-08 NOTE — Patient Instructions (Signed)
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

## 2014-11-09 ENCOUNTER — Ambulatory Visit (INDEPENDENT_AMBULATORY_CARE_PROVIDER_SITE_OTHER): Payer: Medicare Other | Admitting: Cardiovascular Disease

## 2014-11-09 DIAGNOSIS — Z5181 Encounter for therapeutic drug level monitoring: Secondary | ICD-10-CM

## 2014-11-09 DIAGNOSIS — I4891 Unspecified atrial fibrillation: Secondary | ICD-10-CM

## 2014-11-09 DIAGNOSIS — G459 Transient cerebral ischemic attack, unspecified: Secondary | ICD-10-CM

## 2014-11-09 LAB — POCT INR: INR: 2.2

## 2014-11-10 ENCOUNTER — Other Ambulatory Visit: Payer: Self-pay | Admitting: *Deleted

## 2014-11-10 DIAGNOSIS — Z79899 Other long term (current) drug therapy: Secondary | ICD-10-CM

## 2014-11-10 DIAGNOSIS — R609 Edema, unspecified: Secondary | ICD-10-CM

## 2014-11-23 ENCOUNTER — Ambulatory Visit (INDEPENDENT_AMBULATORY_CARE_PROVIDER_SITE_OTHER): Payer: Medicare Other | Admitting: Interventional Cardiology

## 2014-11-23 DIAGNOSIS — I4891 Unspecified atrial fibrillation: Secondary | ICD-10-CM

## 2014-11-23 DIAGNOSIS — Z5181 Encounter for therapeutic drug level monitoring: Secondary | ICD-10-CM

## 2014-11-23 DIAGNOSIS — G459 Transient cerebral ischemic attack, unspecified: Secondary | ICD-10-CM

## 2014-11-23 LAB — POCT INR: INR: 2.3

## 2014-12-07 ENCOUNTER — Encounter (INDEPENDENT_AMBULATORY_CARE_PROVIDER_SITE_OTHER): Payer: Medicare Other | Admitting: Ophthalmology

## 2014-12-07 DIAGNOSIS — H35033 Hypertensive retinopathy, bilateral: Secondary | ICD-10-CM

## 2014-12-07 DIAGNOSIS — H43813 Vitreous degeneration, bilateral: Secondary | ICD-10-CM | POA: Diagnosis not present

## 2014-12-07 DIAGNOSIS — H35373 Puckering of macula, bilateral: Secondary | ICD-10-CM

## 2014-12-07 DIAGNOSIS — H3532 Exudative age-related macular degeneration: Secondary | ICD-10-CM

## 2014-12-07 DIAGNOSIS — I10 Essential (primary) hypertension: Secondary | ICD-10-CM | POA: Diagnosis not present

## 2014-12-07 DIAGNOSIS — H3531 Nonexudative age-related macular degeneration: Secondary | ICD-10-CM | POA: Diagnosis not present

## 2014-12-14 ENCOUNTER — Ambulatory Visit (INDEPENDENT_AMBULATORY_CARE_PROVIDER_SITE_OTHER): Payer: Medicare Other | Admitting: *Deleted

## 2014-12-14 DIAGNOSIS — I6350 Cerebral infarction due to unspecified occlusion or stenosis of unspecified cerebral artery: Secondary | ICD-10-CM

## 2014-12-14 DIAGNOSIS — I4891 Unspecified atrial fibrillation: Secondary | ICD-10-CM | POA: Diagnosis not present

## 2014-12-14 DIAGNOSIS — G459 Transient cerebral ischemic attack, unspecified: Secondary | ICD-10-CM

## 2014-12-14 DIAGNOSIS — Z5181 Encounter for therapeutic drug level monitoring: Secondary | ICD-10-CM

## 2014-12-14 DIAGNOSIS — Z7901 Long term (current) use of anticoagulants: Secondary | ICD-10-CM

## 2014-12-14 DIAGNOSIS — I639 Cerebral infarction, unspecified: Secondary | ICD-10-CM | POA: Diagnosis not present

## 2014-12-14 LAB — POCT INR: INR: 1.7

## 2014-12-17 ENCOUNTER — Telehealth: Payer: Self-pay | Admitting: *Deleted

## 2014-12-20 MED ORDER — FUROSEMIDE 20 MG PO TABS: 20.0000 mg | ORAL_TABLET | Freq: Two times a day (BID) | ORAL | Status: DC

## 2014-12-20 NOTE — Telephone Encounter (Signed)
REFILLED  FUROSEMIDE  .Adonis Housekeeper

## 2014-12-27 ENCOUNTER — Ambulatory Visit (INDEPENDENT_AMBULATORY_CARE_PROVIDER_SITE_OTHER): Payer: Medicare Other | Admitting: Pharmacist

## 2014-12-27 ENCOUNTER — Other Ambulatory Visit (INDEPENDENT_AMBULATORY_CARE_PROVIDER_SITE_OTHER): Payer: Medicare Other | Admitting: *Deleted

## 2014-12-27 DIAGNOSIS — R609 Edema, unspecified: Secondary | ICD-10-CM

## 2014-12-27 DIAGNOSIS — Z5181 Encounter for therapeutic drug level monitoring: Secondary | ICD-10-CM | POA: Diagnosis not present

## 2014-12-27 DIAGNOSIS — R0600 Dyspnea, unspecified: Secondary | ICD-10-CM

## 2014-12-27 DIAGNOSIS — Z7901 Long term (current) use of anticoagulants: Secondary | ICD-10-CM | POA: Diagnosis not present

## 2014-12-27 DIAGNOSIS — I6350 Cerebral infarction due to unspecified occlusion or stenosis of unspecified cerebral artery: Secondary | ICD-10-CM

## 2014-12-27 DIAGNOSIS — Z79899 Other long term (current) drug therapy: Secondary | ICD-10-CM | POA: Diagnosis not present

## 2014-12-27 DIAGNOSIS — I4891 Unspecified atrial fibrillation: Secondary | ICD-10-CM

## 2014-12-27 DIAGNOSIS — G459 Transient cerebral ischemic attack, unspecified: Secondary | ICD-10-CM

## 2014-12-27 DIAGNOSIS — I639 Cerebral infarction, unspecified: Secondary | ICD-10-CM

## 2014-12-27 LAB — BASIC METABOLIC PANEL
BUN: 18 mg/dL (ref 6–23)
CALCIUM: 9.3 mg/dL (ref 8.4–10.5)
CO2: 31 mEq/L (ref 19–32)
Chloride: 102 mEq/L (ref 96–112)
Creatinine, Ser: 0.7 mg/dL (ref 0.40–1.50)
GFR: 113.09 mL/min (ref >60.00)
Glucose, Bld: 100 mg/dL — ABNORMAL HIGH (ref 70–99)
POTASSIUM: 4.4 meq/L (ref 3.5–5.1)
SODIUM: 137 meq/L (ref 135–145)

## 2014-12-27 LAB — POCT INR: INR: 2.2

## 2014-12-27 LAB — BRAIN NATRIURETIC PEPTIDE: PRO B NATRI PEPTIDE: 254 pg/mL — AB (ref 0.0–100.0)

## 2015-01-10 ENCOUNTER — Telehealth: Payer: Self-pay | Admitting: *Deleted

## 2015-01-10 NOTE — Telephone Encounter (Signed)
Follow Up       Pt's wife returning Christine's call for labs.

## 2015-01-10 NOTE — Telephone Encounter (Signed)
PT'S  WIFE  AWARE OF LAB  RESULTS  WHILE  GIVING  RESULTS  MENTIONED PT  MAY  HAVE SMALL ULCERS ON LEGS  HAS  APPT  WITH  DR Johnsie Cancel ON 01-17-15 WILL DISCUSS AT  THAT  TIME  WILL  FORWARD TO  DR Johnsie Cancel  FOR  REVIEW .Adonis Housekeeper

## 2015-01-12 NOTE — Telephone Encounter (Signed)
LM TO CALL BACK ./CY 

## 2015-01-12 NOTE — Telephone Encounter (Signed)
Follow up     Pt is on blood thinner and having problem with left leg.  Pt leg is draining clear fluid.  Please call to discuss.

## 2015-01-13 NOTE — Telephone Encounter (Signed)
Spoke with pt's wife and she states that pt having issues with BLE, mainly LLE. Wife states RLE noted to be very dry and was swollen but is not at this time. Wife states LLE is not swollen but is draining clear fluid and red. Wife says pt denies pain, burning, itching or swelling to either leg. Wife states that pt does have a quarter size ulcer on his left leg. Wife would like to know if Dr. Johnsie Cancel feels pt needs to be seen by someone sooner then Monday or if he is ok with waiting until Monday when pt has appt with Dr. Johnsie Cancel? Will forward to Dr. Johnsie Cancel for review and advisement.

## 2015-01-13 NOTE — Telephone Encounter (Signed)
Informed pt's wife that Dr. Johnsie Cancel said related to his broken hip and that his lasix was increased at last OV.  Informed wife that Dr. Johnsie Cancel said pt should see PCP and can be referred to wound clinic. Wife states that she will try to get pt in with PCP and will wait on referral to wound clinic until pt is seen.

## 2015-01-13 NOTE — Telephone Encounter (Signed)
Related to his broken hip lasix increased to 20 bid last visit Should see primary and can refer to wound clinic

## 2015-01-13 NOTE — Telephone Encounter (Signed)
Follow up      Wife states patient's legs are draining clear fluid.  Should he wait until Monday to be seen?  Pt is not having any other symptoms

## 2015-01-14 NOTE — Progress Notes (Signed)
Patient ID: Aaron Frye, male   DOB: [birth year], 81 y.o.   MRN: [id number]   Aaron Frye is seen today for f/U of HTN, chronic afib and coumadin therapy. Recent rehab stay after broken hip   His coumadin dose has been decreased a bit. He has not had any bleeding porblems and sees the clinic monthly. His rate contol has been fine with no palpitations, SSCP  Some post op dyspnea . His EF has been in the 35-40% range. He has had no congestive symptoms. 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 He is taking crestor for his cholesterol. Samples given   Slipped at home and broke right hip.  Still doing rehab/PT  Had more dyspnea and LE edema.  Lasix increased for RLE edema  Has had hoarseness since surgery   Developed LE venous insuf. And skin breakdown since hip surgery. On antibiotics not per Dr Tollie Pizza.  Only takine lasix and K daily Needs referral to wound care center and increased diuresis   ROS: Denies fever, malais, weight loss, blurry vision, decreased visual acuity, cough, sputum, SOB, hemoptysis, pleuritic pain, palpitaitons, heartburn, abdominal pain, melena, lower extremity edema, claudication, or rash.  All other systems reviewed and negative  General: Affect appropriate Elderly white male  HEENT: hoarse with possible vocal chord injury  Neck supple with no adenopathy JVP normal no bruits no thyromegaly Lungs clear with no wheezing and good diaphragmatic motion Heart:  S1/S2 no murmur, no rub, gallop or click PMI normal Abdomen: benighn, BS positve, no tenderness, no AAA no bruit.  No HSM or HJR Distal pulses intact with no bruits Plus 2 LE  Edema with induration and skin breakdown on shin area Neuro non-focal Skin warm and dry No muscular weakness   Current Outpatient Prescriptions  Medication Sig Dispense Refill  . aspirin 81 MG tablet Take 81 mg by mouth daily.      [name]  atorvastatin (LIPITOR) 40 MG tablet Take 1 tablet (40 mg total) by mouth daily. 90 tablet 0  . ferrous sulfate 325 (65 FE) MG tablet Take 1 tablet (325 mg total) by mouth 2 (two) times daily with a meal. 60 tablet 3  . furosemide (LASIX) 20 MG tablet Take 1 tablet (20 mg total) by mouth 2 (two) times daily. 60 tablet 11  . HYDROcodone-acetaminophen (NORCO/VICODIN) 5-325 MG per tablet Take 1-2 tablets by mouth every 6 (six) hours as needed for moderate pain. 90 tablet 0  . loratadine (CLARITIN) 10 MG tablet Take 1 tablet (10 mg total) by mouth daily. 30 tablet 1  . methocarbamol (ROBAXIN) 500 MG tablet Take 1 tablet (500 mg total) by mouth every 6 (six) hours as needed for muscle spasms (qid prn). 90 tablet 0  . metoprolol succinate (TOPROL-XL) 100 MG 24 hr tablet Take 1 tablet (100 mg total) by mouth daily. Take with or immediately following a meal. 30 tablet 1  . potassium chloride SA (K-DUR,KLOR-CON) 10 MEQ tablet Take 1 tablet (10 mEq total) by mouth 2 (two) times daily. 60 tablet 11  . ramipril (ALTACE) 5 MG capsule Take 1 capsule (5 mg total) by mouth daily. 30 capsule 5  . saccharomyces boulardii (FLORASTOR) 250 MG capsule Take 1 capsule (250 mg total) by mouth 2 (two) times daily. 60 capsule 1  . warfarin (COUMADIN) 5 MG tablet Take 0.5-1 tablets (2.5-5 mg total) by mouth See admin  instructions. Take 5mg  every day except on Tuesday take 2.5mg  per patient 30 tablet 1   No current facility-administered medications for this visit.    Allergies  Penicillins  Electrocardiogram:  afib rate 78 otherwise normal 7/15   Assessment and Plan Afib:  Chronic good rate control and anticoagulation  INR 3.4 today  Dsypnea:  Improved ? CHF  Lungs clear  Doubt PE on Rx anticoagulation Edema:  With venous ulcer refer to wound care center continue antibiotics increase lasix / K to bid  Orhto: s/p fractured right hip continue PT/OT    Jenkins Rouge

## 2015-01-17 ENCOUNTER — Ambulatory Visit (INDEPENDENT_AMBULATORY_CARE_PROVIDER_SITE_OTHER): Payer: Medicare Other | Admitting: *Deleted

## 2015-01-17 ENCOUNTER — Encounter: Payer: Self-pay | Admitting: Cardiovascular Disease

## 2015-01-17 ENCOUNTER — Ambulatory Visit (INDEPENDENT_AMBULATORY_CARE_PROVIDER_SITE_OTHER): Payer: Medicare Other | Admitting: Cardiovascular Disease

## 2015-01-17 VITALS — BP 90/40 | HR 76 | Ht 67.0 in | Wt 162.4 lb

## 2015-01-17 DIAGNOSIS — I639 Cerebral infarction, unspecified: Secondary | ICD-10-CM

## 2015-01-17 DIAGNOSIS — Z5181 Encounter for therapeutic drug level monitoring: Secondary | ICD-10-CM

## 2015-01-17 DIAGNOSIS — I4891 Unspecified atrial fibrillation: Secondary | ICD-10-CM | POA: Diagnosis not present

## 2015-01-17 DIAGNOSIS — G459 Transient cerebral ischemic attack, unspecified: Secondary | ICD-10-CM | POA: Diagnosis not present

## 2015-01-17 DIAGNOSIS — Z7901 Long term (current) use of anticoagulants: Secondary | ICD-10-CM | POA: Diagnosis not present

## 2015-01-17 DIAGNOSIS — L97901 Non-pressure chronic ulcer of unspecified part of unspecified lower leg limited to breakdown of skin: Secondary | ICD-10-CM | POA: Diagnosis not present

## 2015-01-17 DIAGNOSIS — I6350 Cerebral infarction due to unspecified occlusion or stenosis of unspecified cerebral artery: Secondary | ICD-10-CM

## 2015-01-17 LAB — POCT INR: INR: 3.4

## 2015-01-17 MED ORDER — FUROSEMIDE 20 MG PO TABS: 20.0000 mg | ORAL_TABLET | Freq: Two times a day (BID) | ORAL | Status: DC

## 2015-01-17 MED ORDER — POTASSIUM CHLORIDE CRYS ER 10 MEQ PO TBCR: 10.0000 meq | EXTENDED_RELEASE_TABLET | Freq: Two times a day (BID) | ORAL | Status: DC

## 2015-01-17 NOTE — Patient Instructions (Addendum)
Medication Instructions:  FUROSEMIDE  20 MG  TWICE  DAILY  KCL  10 MEQ  TWICE DAILY   Labwork: NONE  Testing/Procedures: NONE  Follow-Up: Your physician recommends that you schedule a follow-up appointment in:  NEXT  AVAILABLE WITH  PA  AND   3 MONTHS  WITH  DR Johnsie Cancel   You have been referred to   [place]  Any Other Special Instructions Will Be Listed Below (If Applicable).

## 2015-01-19 ENCOUNTER — Other Ambulatory Visit: Payer: Self-pay | Admitting: *Deleted

## 2015-01-21 ENCOUNTER — Ambulatory Visit (INDEPENDENT_AMBULATORY_CARE_PROVIDER_SITE_OTHER): Payer: Medicare Other | Admitting: *Deleted

## 2015-01-21 ENCOUNTER — Encounter (HOSPITAL_BASED_OUTPATIENT_CLINIC_OR_DEPARTMENT_OTHER): Payer: Medicare Other | Attending: Internal Medicine

## 2015-01-21 DIAGNOSIS — G459 Transient cerebral ischemic attack, unspecified: Secondary | ICD-10-CM | POA: Diagnosis not present

## 2015-01-21 DIAGNOSIS — L97221 Non-pressure chronic ulcer of left calf limited to breakdown of skin: Secondary | ICD-10-CM | POA: Insufficient documentation

## 2015-01-21 DIAGNOSIS — I4891 Unspecified atrial fibrillation: Secondary | ICD-10-CM | POA: Diagnosis not present

## 2015-01-21 DIAGNOSIS — I639 Cerebral infarction, unspecified: Secondary | ICD-10-CM

## 2015-01-21 DIAGNOSIS — I10 Essential (primary) hypertension: Secondary | ICD-10-CM | POA: Insufficient documentation

## 2015-01-21 DIAGNOSIS — L97211 Non-pressure chronic ulcer of right calf limited to breakdown of skin: Secondary | ICD-10-CM | POA: Insufficient documentation

## 2015-01-21 DIAGNOSIS — I6350 Cerebral infarction due to unspecified occlusion or stenosis of unspecified cerebral artery: Secondary | ICD-10-CM

## 2015-01-21 DIAGNOSIS — I739 Peripheral vascular disease, unspecified: Secondary | ICD-10-CM | POA: Insufficient documentation

## 2015-01-21 DIAGNOSIS — Z5181 Encounter for therapeutic drug level monitoring: Secondary | ICD-10-CM

## 2015-01-21 DIAGNOSIS — Z7901 Long term (current) use of anticoagulants: Secondary | ICD-10-CM

## 2015-01-21 DIAGNOSIS — Z923 Personal history of irradiation: Secondary | ICD-10-CM | POA: Insufficient documentation

## 2015-01-21 LAB — POCT INR: INR: 2

## 2015-01-26 ENCOUNTER — Telehealth: Payer: Self-pay

## 2015-01-26 MED ORDER — METOPROLOL SUCCINATE ER 100 MG PO TB24: 100.0000 mg | ORAL_TABLET | Freq: Every day | ORAL | Status: DC

## 2015-01-26 NOTE — Telephone Encounter (Signed)
PER  DR NISHAN  NEED  TO  DECREASE METOPROLOL TO   50 MG  QD./CY

## 2015-01-26 NOTE — Telephone Encounter (Signed)
Received request for refill of Toprol XL 100MG  daily, please advise if ok to fill, as you did not prescribe this medication.

## 2015-01-26 NOTE — Telephone Encounter (Signed)
LM TO CALL BACK ./CY 

## 2015-01-26 NOTE — Telephone Encounter (Signed)
MED FILLED./CY

## 2015-01-26 NOTE — Telephone Encounter (Signed)
Ok to fill 50 mg toprol not 100mg 

## 2015-01-27 DIAGNOSIS — L97221 Non-pressure chronic ulcer of left calf limited to breakdown of skin: Secondary | ICD-10-CM | POA: Diagnosis not present

## 2015-01-27 DIAGNOSIS — I10 Essential (primary) hypertension: Secondary | ICD-10-CM | POA: Diagnosis not present

## 2015-01-27 DIAGNOSIS — L97211 Non-pressure chronic ulcer of right calf limited to breakdown of skin: Secondary | ICD-10-CM | POA: Diagnosis not present

## 2015-01-27 DIAGNOSIS — I739 Peripheral vascular disease, unspecified: Secondary | ICD-10-CM | POA: Diagnosis not present

## 2015-01-31 ENCOUNTER — Other Ambulatory Visit: Payer: Self-pay | Admitting: *Deleted

## 2015-01-31 MED ORDER — METOPROLOL SUCCINATE ER 50 MG PO TB24: 50.0000 mg | ORAL_TABLET | Freq: Every day | ORAL | Status: DC

## 2015-01-31 NOTE — Telephone Encounter (Signed)
PT  AWARE./CY 

## 2015-02-03 ENCOUNTER — Encounter (HOSPITAL_BASED_OUTPATIENT_CLINIC_OR_DEPARTMENT_OTHER): Payer: Medicare Other | Attending: Internal Medicine

## 2015-02-03 DIAGNOSIS — L97211 Non-pressure chronic ulcer of right calf limited to breakdown of skin: Secondary | ICD-10-CM | POA: Diagnosis not present

## 2015-02-03 DIAGNOSIS — L97221 Non-pressure chronic ulcer of left calf limited to breakdown of skin: Secondary | ICD-10-CM | POA: Insufficient documentation

## 2015-02-03 DIAGNOSIS — I10 Essential (primary) hypertension: Secondary | ICD-10-CM | POA: Insufficient documentation

## 2015-02-03 DIAGNOSIS — I739 Peripheral vascular disease, unspecified: Secondary | ICD-10-CM | POA: Insufficient documentation

## 2015-02-04 ENCOUNTER — Ambulatory Visit (INDEPENDENT_AMBULATORY_CARE_PROVIDER_SITE_OTHER): Payer: Medicare Other | Admitting: Pharmacist

## 2015-02-04 DIAGNOSIS — I6350 Cerebral infarction due to unspecified occlusion or stenosis of unspecified cerebral artery: Secondary | ICD-10-CM

## 2015-02-04 DIAGNOSIS — G459 Transient cerebral ischemic attack, unspecified: Secondary | ICD-10-CM

## 2015-02-04 DIAGNOSIS — Z5181 Encounter for therapeutic drug level monitoring: Secondary | ICD-10-CM

## 2015-02-04 DIAGNOSIS — Z7901 Long term (current) use of anticoagulants: Secondary | ICD-10-CM | POA: Diagnosis not present

## 2015-02-04 DIAGNOSIS — I639 Cerebral infarction, unspecified: Secondary | ICD-10-CM

## 2015-02-04 DIAGNOSIS — I4891 Unspecified atrial fibrillation: Secondary | ICD-10-CM

## 2015-02-04 LAB — POCT INR: INR: 2.9

## 2015-02-07 ENCOUNTER — Telehealth: Payer: Self-pay | Admitting: Cardiovascular Disease

## 2015-02-07 MED ORDER — FERROUS SULFATE 325 (65 FE) MG PO TABS: 325.0000 mg | ORAL_TABLET | Freq: Two times a day (BID) | ORAL | Status: DC

## 2015-02-07 NOTE — Telephone Encounter (Signed)
The patient called directly in to the triage room today. He states he is out of his ferrous sulfate and needs a refill on this.  Confirmed he takes 325 mg one tablet BID. I advised him I would refill today with no additional refills until Dr. Johnsie Cancel approves this. Dr. Johnsie Cancel is out this week. Will forward to him and Altha Harm to address further approvals for ferrous sulfate

## 2015-02-10 DIAGNOSIS — L97211 Non-pressure chronic ulcer of right calf limited to breakdown of skin: Secondary | ICD-10-CM | POA: Diagnosis not present

## 2015-02-10 DIAGNOSIS — L97221 Non-pressure chronic ulcer of left calf limited to breakdown of skin: Secondary | ICD-10-CM | POA: Diagnosis not present

## 2015-02-10 DIAGNOSIS — I739 Peripheral vascular disease, unspecified: Secondary | ICD-10-CM | POA: Diagnosis not present

## 2015-02-10 DIAGNOSIS — I10 Essential (primary) hypertension: Secondary | ICD-10-CM | POA: Diagnosis not present

## 2015-02-14 MED ORDER — FERROUS SULFATE 325 (65 FE) MG PO TABS: 325.0000 mg | ORAL_TABLET | Freq: Two times a day (BID) | ORAL | Status: DC

## 2015-02-14 NOTE — Telephone Encounter (Signed)
VOICE MAIL LEFT   STATING  A  YEARS  WORTH OF REFILLS   WAS SENT   .Adonis Housekeeper

## 2015-02-14 NOTE — Telephone Encounter (Signed)
REFILL SENT 

## 2015-02-14 NOTE — Telephone Encounter (Signed)
Ok to refill iron script for a year

## 2015-02-15 ENCOUNTER — Encounter (INDEPENDENT_AMBULATORY_CARE_PROVIDER_SITE_OTHER): Payer: Medicare Other | Admitting: Ophthalmology

## 2015-02-15 DIAGNOSIS — I10 Essential (primary) hypertension: Secondary | ICD-10-CM

## 2015-02-15 DIAGNOSIS — H3531 Nonexudative age-related macular degeneration: Secondary | ICD-10-CM

## 2015-02-15 DIAGNOSIS — H43813 Vitreous degeneration, bilateral: Secondary | ICD-10-CM | POA: Diagnosis not present

## 2015-02-15 DIAGNOSIS — H3532 Exudative age-related macular degeneration: Secondary | ICD-10-CM | POA: Diagnosis not present

## 2015-02-15 DIAGNOSIS — H35373 Puckering of macula, bilateral: Secondary | ICD-10-CM

## 2015-02-15 DIAGNOSIS — H35033 Hypertensive retinopathy, bilateral: Secondary | ICD-10-CM | POA: Diagnosis not present

## 2015-02-21 ENCOUNTER — Telehealth: Payer: Self-pay | Admitting: Cardiovascular Disease

## 2015-02-21 NOTE — Telephone Encounter (Signed)
SPOKE WITH  PT  LEGS  ARE  BETTER  ONLY  NOTES  A  SCABBED AREA  WOULD LIKE TO KNOW  IF NEEDS TO   APPLY  ANY MED TO AREA    INSTRUCTED  PT  TO CALL  WOUND  CLINIC FOR  TX PLAN  PER PT  HAS  CALLED  WOUND  CLINIC WITH  NO  RESPONSE   AT THIS  TIME PER PT  UNABLE TO  GET TED HOSE ON  ENCOURAGED  TO CONTINUE TO  MONITOR  IF  ANY  CHANGES OCCUR  TO CALL  ./CY

## 2015-02-21 NOTE — Telephone Encounter (Signed)
New message      Pt has problem with leg; pt stated no swelling and looks normal Please call to discuss

## 2015-02-22 ENCOUNTER — Telehealth: Payer: Self-pay | Admitting: Cardiovascular Disease

## 2015-02-22 NOTE — Telephone Encounter (Signed)
New message      Talk to the nurse to give an update on appt for the wound center----they can get him in next thurs.  Please call him because he thinks this is too long

## 2015-02-22 NOTE — Telephone Encounter (Signed)
I spoke with the patient. When Dr. Johnsie Cancel saw him on 01/17/15, it was noted that he had a left leg ulcer and was referred to the wound clinic by Dr. Johnsie Cancel. Per the patient's report, the wound center last saw him last week, but the ulcer is not showing much improvement. He spoke with them today and the soonest they can see him is next Thursday. I advised the patient that I would review with Dr. Johnsie Cancel- will see if he feels the patient needs to be seen sooner than next week at the wound center. We can then try to call and reschedule for sooner available is possible. I explained to the patient we should call him back no later than tomorrow. He is agreeable.

## 2015-02-23 NOTE — Telephone Encounter (Signed)
PT AWARE OF DR  Shiner

## 2015-02-23 NOTE — Telephone Encounter (Signed)
Will forward to [place]. Understood that Dr. Johnsie Cancel just does cardiology, but since I have not seen this patient's wound, I wasn't sure how urgent he should follow back up with the wound center. Altha Harm can you help with this?

## 2015-02-23 NOTE — Telephone Encounter (Signed)
NO ANSWER PHONE JUST RINGS WILL TRY LATER ./CY 

## 2015-02-23 NOTE — Telephone Encounter (Signed)
Needs to see primary or wound center I do cardiology

## 2015-02-28 ENCOUNTER — Ambulatory Visit: Payer: Medicare Other | Admitting: Physician Assistant

## 2015-03-03 ENCOUNTER — Encounter (HOSPITAL_BASED_OUTPATIENT_CLINIC_OR_DEPARTMENT_OTHER): Payer: Medicare Other | Attending: Internal Medicine

## 2015-03-03 DIAGNOSIS — I739 Peripheral vascular disease, unspecified: Secondary | ICD-10-CM | POA: Insufficient documentation

## 2015-03-03 DIAGNOSIS — I87331 Chronic venous hypertension (idiopathic) with ulcer and inflammation of right lower extremity: Secondary | ICD-10-CM | POA: Insufficient documentation

## 2015-03-03 DIAGNOSIS — L97211 Non-pressure chronic ulcer of right calf limited to breakdown of skin: Secondary | ICD-10-CM | POA: Diagnosis not present

## 2015-03-03 DIAGNOSIS — L97221 Non-pressure chronic ulcer of left calf limited to breakdown of skin: Secondary | ICD-10-CM | POA: Diagnosis present

## 2015-03-03 DIAGNOSIS — I87332 Chronic venous hypertension (idiopathic) with ulcer and inflammation of left lower extremity: Secondary | ICD-10-CM | POA: Insufficient documentation

## 2015-03-03 DIAGNOSIS — I10 Essential (primary) hypertension: Secondary | ICD-10-CM | POA: Insufficient documentation

## 2015-03-04 ENCOUNTER — Ambulatory Visit (INDEPENDENT_AMBULATORY_CARE_PROVIDER_SITE_OTHER): Payer: Medicare Other | Admitting: *Deleted

## 2015-03-04 DIAGNOSIS — Z7901 Long term (current) use of anticoagulants: Secondary | ICD-10-CM | POA: Diagnosis not present

## 2015-03-04 DIAGNOSIS — I639 Cerebral infarction, unspecified: Secondary | ICD-10-CM

## 2015-03-04 DIAGNOSIS — Z5181 Encounter for therapeutic drug level monitoring: Secondary | ICD-10-CM | POA: Diagnosis not present

## 2015-03-04 DIAGNOSIS — G459 Transient cerebral ischemic attack, unspecified: Secondary | ICD-10-CM | POA: Diagnosis not present

## 2015-03-04 DIAGNOSIS — I4891 Unspecified atrial fibrillation: Secondary | ICD-10-CM

## 2015-03-04 DIAGNOSIS — I6350 Cerebral infarction due to unspecified occlusion or stenosis of unspecified cerebral artery: Secondary | ICD-10-CM

## 2015-03-04 LAB — POCT INR: INR: 2.3

## 2015-03-09 ENCOUNTER — Ambulatory Visit (INDEPENDENT_AMBULATORY_CARE_PROVIDER_SITE_OTHER): Payer: Medicare Other | Admitting: Cardiology

## 2015-03-09 ENCOUNTER — Ambulatory Visit (INDEPENDENT_AMBULATORY_CARE_PROVIDER_SITE_OTHER): Payer: Medicare Other | Admitting: *Deleted

## 2015-03-09 ENCOUNTER — Encounter: Payer: Self-pay | Admitting: Cardiology

## 2015-03-09 VITALS — BP 120/60 | HR 71 | Ht 67.0 in | Wt 154.0 lb

## 2015-03-09 DIAGNOSIS — I4891 Unspecified atrial fibrillation: Secondary | ICD-10-CM

## 2015-03-09 DIAGNOSIS — Z7901 Long term (current) use of anticoagulants: Secondary | ICD-10-CM | POA: Diagnosis not present

## 2015-03-09 DIAGNOSIS — I639 Cerebral infarction, unspecified: Secondary | ICD-10-CM

## 2015-03-09 DIAGNOSIS — G459 Transient cerebral ischemic attack, unspecified: Secondary | ICD-10-CM | POA: Diagnosis not present

## 2015-03-09 DIAGNOSIS — K921 Melena: Secondary | ICD-10-CM

## 2015-03-09 DIAGNOSIS — I6350 Cerebral infarction due to unspecified occlusion or stenosis of unspecified cerebral artery: Secondary | ICD-10-CM

## 2015-03-09 DIAGNOSIS — Z5181 Encounter for therapeutic drug level monitoring: Secondary | ICD-10-CM

## 2015-03-09 LAB — CBC
HEMATOCRIT: 40.1 % (ref 39.0–52.0)
HEMOGLOBIN: 13 g/dL (ref 13.0–17.0)
MCHC: 32.4 g/dL (ref 30.0–36.0)
MCV: 78.3 fl (ref 78.0–100.0)
Platelets: 269 10*3/uL (ref 150.0–400.0)
RBC: 5.13 Mil/uL (ref 4.22–5.81)
RDW: 17.5 % — AB (ref 11.5–15.5)
WBC: 10.8 10*3/uL — AB (ref 4.0–10.5)

## 2015-03-09 LAB — POCT INR: INR: 2.9

## 2015-03-09 NOTE — Patient Instructions (Addendum)
Medication Instructions:  Your physician recommends that you continue on your current medications as directed. Please refer to the Current Medication list given to you today.  Labwork: Your physician recommends that you have lab work today. CBC  Testing/Procedures: NONE  Follow-Up: Your physician recommends that you keep your scheduled appointment with Dr. Johnsie Cancel in October.   Any Other Special Instructions Will Be Listed Below (If Applicable).

## 2015-03-09 NOTE — Progress Notes (Signed)
03/09/2015 Aaron Frye   [birth year]  [id number]  Primary Physician Stephens Shire, MD Primary Cardiologist: Johnsie Cancel   Reason for Visit/CC: Routine follow-up for atrial fibrillation and hypertension  HPI:  The patient is a 81 year old male, followed by Dr. Johnsie Cancel, with a history of hypertension, chronic atrial fibrillation and prior h/o CVA. He is on chronic anticoagulation with Coumadin for stroke prophylaxis. His INRs are followed in our office. His most recent echocardiogram April 2016 revealed normal left ventricular systolic function. Ejection fraction was 60-65%. His last office visit with Dr. Johnsie Cancel was 01/17/2015. He was felt to be stable from a cardiac standpoint.  He was instructed to follow-up in 3 months for repeat evaluation.  He now presents back to clinic today for follow-up. He has no complaints. He denies any chest pain, dyspnea, palpitations, dizziness, syncope or near syncope. His only concern is that he has noticed black stools. He is unsure if this is melena or due to Fe supplementation.   His BP is well controlled today at 120/60. His afib is well controlled with a ventricular rate of 71 bpm. He is asymptomatic. He is fully compliant with Coumadin. INR is therapeutic at 2.9. He is pretty stable on his feet. No recent falls. He also has a h/o venous ulcers which has been managed at the [facility] wound center.    Current Outpatient Prescriptions  Medication Sig Dispense Refill  . aspirin 81 MG tablet Take 81 mg by mouth daily.      [name] atorvastatin (LIPITOR) 40 MG tablet Take 1 tablet (40 mg total) by mouth daily. 90 tablet 0  . ferrous sulfate 325 (65 FE) MG tablet Take 1 tablet (325 mg total) by mouth 2 (two) times daily with a meal. 60 tablet 11  . furosemide (LASIX) 20 MG tablet Take 1 tablet (20 mg total) by mouth 2 (two) times daily. 60 tablet 11  . HYDROcodone-acetaminophen (NORCO/VICODIN) 5-325 MG per tablet Take 1-2 tablets by mouth every 6 (six) hours as needed for  moderate pain. 90 tablet 0  . loratadine (CLARITIN) 10 MG tablet Take 1 tablet (10 mg total) by mouth daily. 30 tablet 1  . methocarbamol (ROBAXIN) 500 MG tablet Take 1 tablet (500 mg total) by mouth every 6 (six) hours as needed for muscle spasms (qid prn). 90 tablet 0  . metoprolol succinate (TOPROL-XL) 50 MG 24 hr tablet Take 1 tablet (50 mg total) by mouth daily. Take with or immediately following a meal. 30 tablet 11  . potassium chloride SA (K-DUR,KLOR-CON) 10 MEQ tablet Take 1 tablet (10 mEq total) by mouth 2 (two) times daily. 60 tablet 11  . ramipril (ALTACE) 5 MG capsule Take 1 capsule (5 mg total) by mouth daily. 30 capsule 5  . saccharomyces boulardii (FLORASTOR) 250 MG capsule Take 1 capsule (250 mg total) by mouth 2 (two) times daily. 60 capsule 1  . Sulfamethoxazole-Trimethoprim (BACTRIM PO) Take 1 tablet by mouth 2 (two) times daily.     [name] warfarin (COUMADIN) 5 MG tablet Take 0.5-1 tablets (2.5-5 mg total) by mouth See admin instructions. Take 5mg  every day except on Tuesday take 2.5mg  per patient 30 tablet 1   No current facility-administered medications for this visit.    Allergies  Allergen Reactions  . Penicillins Rash    Social History   Social History  . Marital Status: Married    Spouse Name: N/A  . Number of Children: N/A  . 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 Smoking status: Never Smoker   . Smokeless tobacco: Not on file  . Alcohol Use: No  . Drug Use: No  . Sexual Activity: Not on file   Other Topics Concern  . Not on file   Social History Narrative     Review of Systems: General: negative for chills, fever, night sweats or weight changes.  Cardiovascular: negative for chest pain, dyspnea on exertion, edema, orthopnea, palpitations, paroxysmal nocturnal dyspnea or shortness of breath Dermatological: negative for rash Respiratory: negative for cough or wheezing Urologic: negative  for hematuria Abdominal: negative for nausea, vomiting, diarrhea, bright red blood per rectum, melena, or hematemesis Neurologic: negative for visual changes, syncope, or dizziness All other systems reviewed and are otherwise negative except as noted above.    Blood pressure 120/60, pulse 71, height 5\' 7"  (1.702 m), weight 154 lb (69.854 kg).  General appearance: alert, cooperative and no distress Neck: no carotid bruit and no JVD Lungs: clear to auscultation bilaterally Heart: irregularly irregular rhythm and regular rate Extremities: no LEE; both legs wraped in ace-wrap Pulses: 2+ and symmetric Skin: warm and dry Neurologic: Grossly normal  EKG atrial fibrillation with a CVR 71 bpm   ASSESSMENT AND PLAN:   1. Chronic atrial fibrillation: rate is well controlled on metroprolol. INR is therapuetic on Coumadin at 2.9.   2. HTN: BP is well controlled on current regimen.  3. LEE Venous Ulcers: Followed at [facility] wound clinic. Both legs are wraped in dressings. Currently on antibiotics.   4. H/O CVA: no recent stroke-like symptoms. Fully compliant with Coumadin. INR is therapeutic.   5. Black Stools: may be secondary to supplemental iron, however given he is on Couamdin + ASA will check a CBC to assess Hgb.   PLAN  No change in medical therapy. CBC to assess Hgb. Continue routien f/u with Dr. Marnette Burgess, [facility] PA-C 03/09/2015 3:56 PM

## 2015-03-10 DIAGNOSIS — L97211 Non-pressure chronic ulcer of right calf limited to breakdown of skin: Secondary | ICD-10-CM | POA: Diagnosis not present

## 2015-03-10 DIAGNOSIS — I739 Peripheral vascular disease, unspecified: Secondary | ICD-10-CM | POA: Diagnosis not present

## 2015-03-10 DIAGNOSIS — I87331 Chronic venous hypertension (idiopathic) with ulcer and inflammation of right lower extremity: Secondary | ICD-10-CM | POA: Diagnosis not present

## 2015-03-10 DIAGNOSIS — I87332 Chronic venous hypertension (idiopathic) with ulcer and inflammation of left lower extremity: Secondary | ICD-10-CM | POA: Diagnosis not present

## 2015-03-16 ENCOUNTER — Ambulatory Visit (INDEPENDENT_AMBULATORY_CARE_PROVIDER_SITE_OTHER): Payer: Medicare Other | Admitting: *Deleted

## 2015-03-16 DIAGNOSIS — G459 Transient cerebral ischemic attack, unspecified: Secondary | ICD-10-CM | POA: Diagnosis not present

## 2015-03-16 DIAGNOSIS — I6350 Cerebral infarction due to unspecified occlusion or stenosis of unspecified cerebral artery: Secondary | ICD-10-CM

## 2015-03-16 DIAGNOSIS — I4891 Unspecified atrial fibrillation: Secondary | ICD-10-CM

## 2015-03-16 DIAGNOSIS — I639 Cerebral infarction, unspecified: Secondary | ICD-10-CM

## 2015-03-16 DIAGNOSIS — Z7901 Long term (current) use of anticoagulants: Secondary | ICD-10-CM

## 2015-03-16 DIAGNOSIS — Z5181 Encounter for therapeutic drug level monitoring: Secondary | ICD-10-CM | POA: Diagnosis not present

## 2015-03-16 LAB — POCT INR: INR: 2.2

## 2015-03-17 DIAGNOSIS — I87331 Chronic venous hypertension (idiopathic) with ulcer and inflammation of right lower extremity: Secondary | ICD-10-CM | POA: Diagnosis not present

## 2015-03-17 DIAGNOSIS — L97211 Non-pressure chronic ulcer of right calf limited to breakdown of skin: Secondary | ICD-10-CM | POA: Diagnosis not present

## 2015-03-17 DIAGNOSIS — I87332 Chronic venous hypertension (idiopathic) with ulcer and inflammation of left lower extremity: Secondary | ICD-10-CM | POA: Diagnosis not present

## 2015-03-17 DIAGNOSIS — I739 Peripheral vascular disease, unspecified: Secondary | ICD-10-CM | POA: Diagnosis not present

## 2015-03-18 ENCOUNTER — Encounter: Payer: Self-pay | Admitting: *Deleted

## 2015-03-22 ENCOUNTER — Telehealth: Payer: Self-pay | Admitting: *Deleted

## 2015-03-22 NOTE — Telephone Encounter (Signed)
Pt returned my call.  He was advised that his CBC was normal and there was no anemia.  Pt verbalized appreciation and understanding.[name]

## 2015-03-24 DIAGNOSIS — I87332 Chronic venous hypertension (idiopathic) with ulcer and inflammation of left lower extremity: Secondary | ICD-10-CM | POA: Diagnosis not present

## 2015-03-24 DIAGNOSIS — I87331 Chronic venous hypertension (idiopathic) with ulcer and inflammation of right lower extremity: Secondary | ICD-10-CM | POA: Diagnosis not present

## 2015-03-24 DIAGNOSIS — L97211 Non-pressure chronic ulcer of right calf limited to breakdown of skin: Secondary | ICD-10-CM | POA: Diagnosis not present

## 2015-03-24 DIAGNOSIS — I739 Peripheral vascular disease, unspecified: Secondary | ICD-10-CM | POA: Diagnosis not present

## 2015-03-30 ENCOUNTER — Ambulatory Visit (INDEPENDENT_AMBULATORY_CARE_PROVIDER_SITE_OTHER): Payer: Medicare Other | Admitting: *Deleted

## 2015-03-30 DIAGNOSIS — I6350 Cerebral infarction due to unspecified occlusion or stenosis of unspecified cerebral artery: Secondary | ICD-10-CM

## 2015-03-30 DIAGNOSIS — I4891 Unspecified atrial fibrillation: Secondary | ICD-10-CM

## 2015-03-30 DIAGNOSIS — G459 Transient cerebral ischemic attack, unspecified: Secondary | ICD-10-CM | POA: Diagnosis not present

## 2015-03-30 DIAGNOSIS — Z5181 Encounter for therapeutic drug level monitoring: Secondary | ICD-10-CM | POA: Diagnosis not present

## 2015-03-30 DIAGNOSIS — I639 Cerebral infarction, unspecified: Secondary | ICD-10-CM | POA: Diagnosis not present

## 2015-03-30 DIAGNOSIS — Z7901 Long term (current) use of anticoagulants: Secondary | ICD-10-CM | POA: Diagnosis not present

## 2015-03-30 LAB — POCT INR: INR: 1.9

## 2015-03-31 DIAGNOSIS — I87332 Chronic venous hypertension (idiopathic) with ulcer and inflammation of left lower extremity: Secondary | ICD-10-CM | POA: Diagnosis not present

## 2015-03-31 DIAGNOSIS — I87331 Chronic venous hypertension (idiopathic) with ulcer and inflammation of right lower extremity: Secondary | ICD-10-CM | POA: Diagnosis not present

## 2015-03-31 DIAGNOSIS — I739 Peripheral vascular disease, unspecified: Secondary | ICD-10-CM | POA: Diagnosis not present

## 2015-03-31 DIAGNOSIS — L97211 Non-pressure chronic ulcer of right calf limited to breakdown of skin: Secondary | ICD-10-CM | POA: Diagnosis not present

## 2015-04-07 ENCOUNTER — Encounter (HOSPITAL_BASED_OUTPATIENT_CLINIC_OR_DEPARTMENT_OTHER): Payer: Medicare Other | Attending: Internal Medicine

## 2015-04-07 DIAGNOSIS — I10 Essential (primary) hypertension: Secondary | ICD-10-CM | POA: Insufficient documentation

## 2015-04-07 DIAGNOSIS — H269 Unspecified cataract: Secondary | ICD-10-CM | POA: Insufficient documentation

## 2015-04-07 DIAGNOSIS — L97821 Non-pressure chronic ulcer of other part of left lower leg limited to breakdown of skin: Secondary | ICD-10-CM | POA: Diagnosis present

## 2015-04-07 DIAGNOSIS — Z923 Personal history of irradiation: Secondary | ICD-10-CM | POA: Diagnosis not present

## 2015-04-08 ENCOUNTER — Other Ambulatory Visit: Payer: Self-pay

## 2015-04-08 MED ORDER — WARFARIN SODIUM 5 MG PO TABS: ORAL_TABLET | ORAL | Status: DC

## 2015-04-14 DIAGNOSIS — L97821 Non-pressure chronic ulcer of other part of left lower leg limited to breakdown of skin: Secondary | ICD-10-CM | POA: Diagnosis not present

## 2015-04-14 DIAGNOSIS — Z923 Personal history of irradiation: Secondary | ICD-10-CM | POA: Diagnosis not present

## 2015-04-14 DIAGNOSIS — I10 Essential (primary) hypertension: Secondary | ICD-10-CM | POA: Diagnosis not present

## 2015-04-14 DIAGNOSIS — H269 Unspecified cataract: Secondary | ICD-10-CM | POA: Diagnosis not present

## 2015-04-18 NOTE — Progress Notes (Signed)
Patient ID: Aaron Frye, male   DOB: 1925/02/28, 81 y.o.   MRN: [id number]   04/20/2015 Aaron Frye   06/24/1925  [id number]  Primary Physician Stephens Shire, MD Primary Cardiologist: Johnsie Cancel   Reason for Visit/CC: Routine follow-up for atrial fibrillation and hypertension  HPI:  The patient is a 81 year old male,  with a history of hypertension, chronic atrial fibrillation and prior h/o CVA. He is on chronic anticoagulation with Coumadin for stroke prophylaxis. His INRs are followed in our office. His most recent echocardiogram April 2016 revealed normal left ventricular systolic function. Ejection fraction was 60-65%. His last office visit with me  was 01/17/2015. He was felt to be stable from a cardiac standpoint.  He was instructed to follow-up in 3 months for repeat evaluation.  03/09/15 saw PA  concenred that he  noticed black stools. He is unsure if this is melena or due to Fe supplementation. Hct was 40.1 no guaics given to patient   His BP is well controlled today at 120/60. His afib is well controlled with a ventricular rate of 71 bpm. He is asymptomatic. He is fully compliant with Coumadin. INR is therapeutic at 2.9. He is pretty stable on his feet. No recent falls. He also has a h/o venous ulcers which has been managed at the [facility] wound center.    Current Outpatient Prescriptions  Medication Sig Dispense Refill  . aspirin 81 MG tablet Take 81 mg by mouth daily.      [name] atorvastatin (LIPITOR) 40 MG tablet Take 1 tablet (40 mg total) by mouth daily. 90 tablet 0  . ferrous sulfate 325 (65 FE) MG tablet Take 1 tablet (325 mg total) by mouth 2 (two) times daily with a meal. 60 tablet 11  . furosemide (LASIX) 20 MG tablet Take 1 tablet (20 mg total) by mouth 2 (two) times daily. 60 tablet 11  . HYDROcodone-acetaminophen (NORCO/VICODIN) 5-325 MG per tablet Take 1-2 tablets by mouth every 6 (six) hours as needed for moderate pain. 90 tablet 0  . loratadine (CLARITIN) 10 MG tablet  Take 1 tablet (10 mg total) by mouth daily. 30 tablet 1  . methocarbamol (ROBAXIN) 500 MG tablet Take 1 tablet (500 mg total) by mouth every 6 (six) hours as needed for muscle spasms (qid prn). 90 tablet 0  . metoprolol succinate (TOPROL-XL) 50 MG 24 hr tablet Take 1 tablet (50 mg total) by mouth daily. Take with or immediately following a meal. 30 tablet 11  . potassium chloride SA (K-DUR,KLOR-CON) 10 MEQ tablet Take 1 tablet (10 mEq total) by mouth 2 (two) times daily. 60 tablet 11  . ramipril (ALTACE) 5 MG capsule Take 1 capsule (5 mg total) by mouth daily. 30 capsule 5  . saccharomyces boulardii (FLORASTOR) 250 MG capsule Take 1 capsule (250 mg total) by mouth 2 (two) times daily. 60 capsule 1  . Sulfamethoxazole-Trimethoprim (BACTRIM PO) Take 1 tablet by mouth 2 (two) times daily.     [name] warfarin (COUMADIN) 5 MG tablet Take as directed by Coumadin Clinic 40 tablet 2   No current facility-administered medications for this visit.    Allergies  Allergen Reactions  . Penicillins Rash    Social History   Social History  . Marital Status: Married    Spouse Name: N/A  . Number of Children: N/A  . 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 Smoking status: Never Smoker   .  Smokeless tobacco: Not on file  . Alcohol Use: No  . Drug Use: No  . Sexual Activity: Not on file   Other Topics Concern  . Not on file   Social History Narrative     Review of Systems: General: negative for chills, fever, night sweats or weight changes.  Cardiovascular: negative for chest pain, dyspnea on exertion, edema, orthopnea, palpitations, paroxysmal nocturnal dyspnea or shortness of breath Dermatological: negative for rash Respiratory: negative for cough or wheezing Urologic: negative for hematuria Abdominal: negative for nausea, vomiting, diarrhea, bright red blood per rectum, melena, or hematemesis Neurologic: negative for visual changes,  syncope, or dizziness All other systems reviewed and are otherwise negative except as noted above.    Blood pressure 120/70, pulse 79, height 5\' 7"  (1.702 m), weight 72.303 kg (159 lb 6.4 oz), SpO2 97 %.  General appearance: alert, cooperative and no distress Neck: no carotid bruit and no JVD Lungs: clear to auscultation bilaterally Heart: irregularly irregular rhythm and regular rate Extremities: no LEE; both legs wraped in ace-wrap Pulses: 2+ and symmetric Skin: warm and dry Neurologic: Grossly normal  EKG atrial fibrillation with a CVR 71 bpm   ASSESSMENT AND PLAN:   1. Chronic atrial fibrillation: rate is well controlled on metroprolol. INR is therapuetic on Coumadin at 2.9.   2. HTN: BP is well controlled on current regimen.  3. LEE Venous Ulcers: Followed at [facility] wound clinic. Both legs are wraped in dressings. Currently on antibiotics.   4. H/O CVA: no recent stroke-like symptoms. Fully compliant with Coumadin. INR is therapeutic.   5. Black Stools: Hct stable likely from iron Rx  Jenkins Rouge

## 2015-04-20 ENCOUNTER — Encounter: Payer: Self-pay | Admitting: Cardiovascular Disease

## 2015-04-20 ENCOUNTER — Ambulatory Visit (INDEPENDENT_AMBULATORY_CARE_PROVIDER_SITE_OTHER): Payer: Medicare Other | Admitting: Cardiovascular Disease

## 2015-04-20 ENCOUNTER — Ambulatory Visit (INDEPENDENT_AMBULATORY_CARE_PROVIDER_SITE_OTHER): Payer: Medicare Other | Admitting: Pharmacist

## 2015-04-20 VITALS — BP 120/70 | HR 79 | Ht 67.0 in | Wt 159.4 lb

## 2015-04-20 DIAGNOSIS — Z7901 Long term (current) use of anticoagulants: Secondary | ICD-10-CM | POA: Diagnosis not present

## 2015-04-20 DIAGNOSIS — I6350 Cerebral infarction due to unspecified occlusion or stenosis of unspecified cerebral artery: Secondary | ICD-10-CM

## 2015-04-20 DIAGNOSIS — G459 Transient cerebral ischemic attack, unspecified: Secondary | ICD-10-CM

## 2015-04-20 DIAGNOSIS — I480 Paroxysmal atrial fibrillation: Secondary | ICD-10-CM

## 2015-04-20 DIAGNOSIS — Z5181 Encounter for therapeutic drug level monitoring: Secondary | ICD-10-CM

## 2015-04-20 DIAGNOSIS — I4891 Unspecified atrial fibrillation: Secondary | ICD-10-CM | POA: Diagnosis not present

## 2015-04-20 LAB — POCT INR: INR: 2.9

## 2015-04-20 NOTE — Patient Instructions (Signed)
Medication Instructions:  Your physician recommends that you continue on your current medications as directed. Please refer to the Current Medication list given to you today.   Labwork: NONE  Testing/Procedures: NONE  Follow-Up: Your physician wants you to follow-up in: 6 MONTHS  WITH  DR NISHAN  You will receive a reminder letter in the mail two months in advance. If you don't receive a letter, please call our office to schedule the follow-up appointment.  Any Other Special Instructions Will Be Listed Below (If Applicable).   

## 2015-04-21 DIAGNOSIS — Z923 Personal history of irradiation: Secondary | ICD-10-CM | POA: Diagnosis not present

## 2015-04-21 DIAGNOSIS — I10 Essential (primary) hypertension: Secondary | ICD-10-CM | POA: Diagnosis not present

## 2015-04-21 DIAGNOSIS — L97821 Non-pressure chronic ulcer of other part of left lower leg limited to breakdown of skin: Secondary | ICD-10-CM | POA: Diagnosis not present

## 2015-04-21 DIAGNOSIS — H269 Unspecified cataract: Secondary | ICD-10-CM | POA: Diagnosis not present

## 2015-04-27 ENCOUNTER — Encounter (INDEPENDENT_AMBULATORY_CARE_PROVIDER_SITE_OTHER): Payer: Medicare Other | Admitting: Ophthalmology

## 2015-04-27 DIAGNOSIS — H43813 Vitreous degeneration, bilateral: Secondary | ICD-10-CM | POA: Diagnosis not present

## 2015-04-27 DIAGNOSIS — H35033 Hypertensive retinopathy, bilateral: Secondary | ICD-10-CM

## 2015-04-27 DIAGNOSIS — H353123 Nonexudative age-related macular degeneration, left eye, advanced atrophic without subfoveal involvement: Secondary | ICD-10-CM

## 2015-04-27 DIAGNOSIS — I10 Essential (primary) hypertension: Secondary | ICD-10-CM

## 2015-04-27 DIAGNOSIS — H353211 Exudative age-related macular degeneration, right eye, with active choroidal neovascularization: Secondary | ICD-10-CM | POA: Diagnosis not present

## 2015-04-28 DIAGNOSIS — I10 Essential (primary) hypertension: Secondary | ICD-10-CM | POA: Diagnosis not present

## 2015-04-28 DIAGNOSIS — L97821 Non-pressure chronic ulcer of other part of left lower leg limited to breakdown of skin: Secondary | ICD-10-CM | POA: Diagnosis not present

## 2015-04-28 DIAGNOSIS — H269 Unspecified cataract: Secondary | ICD-10-CM | POA: Diagnosis not present

## 2015-04-28 DIAGNOSIS — Z923 Personal history of irradiation: Secondary | ICD-10-CM | POA: Diagnosis not present

## 2015-05-05 ENCOUNTER — Encounter (HOSPITAL_BASED_OUTPATIENT_CLINIC_OR_DEPARTMENT_OTHER): Payer: Medicare Other | Attending: Internal Medicine

## 2015-05-05 DIAGNOSIS — Z09 Encounter for follow-up examination after completed treatment for conditions other than malignant neoplasm: Secondary | ICD-10-CM | POA: Insufficient documentation

## 2015-05-05 DIAGNOSIS — H269 Unspecified cataract: Secondary | ICD-10-CM | POA: Insufficient documentation

## 2015-05-05 DIAGNOSIS — Z923 Personal history of irradiation: Secondary | ICD-10-CM | POA: Diagnosis not present

## 2015-05-05 DIAGNOSIS — I872 Venous insufficiency (chronic) (peripheral): Secondary | ICD-10-CM | POA: Insufficient documentation

## 2015-05-05 DIAGNOSIS — Z872 Personal history of diseases of the skin and subcutaneous tissue: Secondary | ICD-10-CM | POA: Diagnosis not present

## 2015-05-05 DIAGNOSIS — I10 Essential (primary) hypertension: Secondary | ICD-10-CM | POA: Diagnosis not present

## 2015-05-05 DIAGNOSIS — I87333 Chronic venous hypertension (idiopathic) with ulcer and inflammation of bilateral lower extremity: Secondary | ICD-10-CM | POA: Diagnosis not present

## 2015-05-09 ENCOUNTER — Telehealth: Payer: Self-pay | Admitting: Cardiovascular Disease

## 2015-05-09 NOTE — Telephone Encounter (Signed)
New Message    Pt calling stating that he was given a prescription for Atorvastatin 40 mg while in the hospital and he needs for it to be "renewed" by Dr. Johnsie Cancel. Please call back and advise.

## 2015-05-10 MED ORDER — ATORVASTATIN CALCIUM 40 MG PO TABS: 40.0000 mg | ORAL_TABLET | Freq: Every day | ORAL | Status: DC

## 2015-05-10 NOTE — Telephone Encounter (Signed)
MED REFILLED  AT    PT'S REQUEST .Adonis Housekeeper

## 2015-05-18 ENCOUNTER — Ambulatory Visit (INDEPENDENT_AMBULATORY_CARE_PROVIDER_SITE_OTHER): Payer: Medicare Other | Admitting: *Deleted

## 2015-05-18 DIAGNOSIS — G459 Transient cerebral ischemic attack, unspecified: Secondary | ICD-10-CM | POA: Diagnosis not present

## 2015-05-18 DIAGNOSIS — Z7901 Long term (current) use of anticoagulants: Secondary | ICD-10-CM | POA: Diagnosis not present

## 2015-05-18 DIAGNOSIS — I4891 Unspecified atrial fibrillation: Secondary | ICD-10-CM

## 2015-05-18 DIAGNOSIS — Z5181 Encounter for therapeutic drug level monitoring: Secondary | ICD-10-CM

## 2015-05-18 DIAGNOSIS — I6350 Cerebral infarction due to unspecified occlusion or stenosis of unspecified cerebral artery: Secondary | ICD-10-CM | POA: Diagnosis not present

## 2015-05-18 LAB — POCT INR: INR: 2.3

## 2015-06-22 ENCOUNTER — Ambulatory Visit (INDEPENDENT_AMBULATORY_CARE_PROVIDER_SITE_OTHER): Payer: Medicare Other | Admitting: *Deleted

## 2015-06-22 DIAGNOSIS — Z7901 Long term (current) use of anticoagulants: Secondary | ICD-10-CM

## 2015-06-22 DIAGNOSIS — I6350 Cerebral infarction due to unspecified occlusion or stenosis of unspecified cerebral artery: Secondary | ICD-10-CM | POA: Diagnosis not present

## 2015-06-22 DIAGNOSIS — Z5181 Encounter for therapeutic drug level monitoring: Secondary | ICD-10-CM | POA: Diagnosis not present

## 2015-06-22 DIAGNOSIS — G459 Transient cerebral ischemic attack, unspecified: Secondary | ICD-10-CM | POA: Diagnosis not present

## 2015-06-22 DIAGNOSIS — I4891 Unspecified atrial fibrillation: Secondary | ICD-10-CM

## 2015-06-22 LAB — POCT INR: INR: 2.5

## 2015-07-06 ENCOUNTER — Encounter (INDEPENDENT_AMBULATORY_CARE_PROVIDER_SITE_OTHER): Payer: Medicare Other | Admitting: Ophthalmology

## 2015-07-06 DIAGNOSIS — H43813 Vitreous degeneration, bilateral: Secondary | ICD-10-CM

## 2015-07-06 DIAGNOSIS — H353124 Nonexudative age-related macular degeneration, left eye, advanced atrophic with subfoveal involvement: Secondary | ICD-10-CM | POA: Diagnosis not present

## 2015-07-06 DIAGNOSIS — H353211 Exudative age-related macular degeneration, right eye, with active choroidal neovascularization: Secondary | ICD-10-CM

## 2015-07-06 DIAGNOSIS — H35033 Hypertensive retinopathy, bilateral: Secondary | ICD-10-CM | POA: Diagnosis not present

## 2015-07-06 DIAGNOSIS — I10 Essential (primary) hypertension: Secondary | ICD-10-CM

## 2015-07-14 ENCOUNTER — Encounter (HOSPITAL_COMMUNITY): Payer: Self-pay | Admitting: Emergency Medicine

## 2015-07-14 ENCOUNTER — Emergency Department (HOSPITAL_COMMUNITY)
Admission: EM | Admit: 2015-07-14 | Discharge: 2015-07-15 | Disposition: A | Discharge status: Home / Self Care | Payer: Medicare Other | Attending: Emergency Medicine | Admitting: Emergency Medicine

## 2015-07-14 DIAGNOSIS — Z8781 Personal history of (healed) traumatic fracture: Secondary | ICD-10-CM | POA: Insufficient documentation

## 2015-07-14 DIAGNOSIS — Z792 Long term (current) use of antibiotics: Secondary | ICD-10-CM | POA: Insufficient documentation

## 2015-07-14 DIAGNOSIS — Z23 Encounter for immunization: Secondary | ICD-10-CM | POA: Insufficient documentation

## 2015-07-14 DIAGNOSIS — E785 Hyperlipidemia, unspecified: Secondary | ICD-10-CM | POA: Diagnosis not present

## 2015-07-14 DIAGNOSIS — Z79899 Other long term (current) drug therapy: Secondary | ICD-10-CM | POA: Insufficient documentation

## 2015-07-14 DIAGNOSIS — I10 Essential (primary) hypertension: Secondary | ICD-10-CM | POA: Diagnosis not present

## 2015-07-14 DIAGNOSIS — Z880 Allergy status to penicillin: Secondary | ICD-10-CM | POA: Diagnosis not present

## 2015-07-14 DIAGNOSIS — Z8546 Personal history of malignant neoplasm of prostate: Secondary | ICD-10-CM | POA: Insufficient documentation

## 2015-07-14 DIAGNOSIS — R040 Epistaxis: Secondary | ICD-10-CM | POA: Insufficient documentation

## 2015-07-14 DIAGNOSIS — Z7901 Long term (current) use of anticoagulants: Secondary | ICD-10-CM | POA: Diagnosis not present

## 2015-07-14 DIAGNOSIS — Z8673 Personal history of transient ischemic attack (TIA), and cerebral infarction without residual deficits: Secondary | ICD-10-CM | POA: Diagnosis not present

## 2015-07-14 DIAGNOSIS — Z7982 Long term (current) use of aspirin: Secondary | ICD-10-CM | POA: Diagnosis not present

## 2015-07-14 LAB — CBC
HEMATOCRIT: 38.4 % — AB (ref 39.0–52.0)
Hemoglobin: 12.1 g/dL — ABNORMAL LOW (ref 13.0–17.0)
MCH: 25.6 pg — AB (ref 26.0–34.0)
MCHC: 31.5 g/dL (ref 30.0–36.0)
MCV: 81.2 fL (ref 78.0–100.0)
Platelets: 244 10*3/uL (ref 150–400)
RBC: 4.73 MIL/uL (ref 4.22–5.81)
RDW: 15.3 % (ref 11.5–15.5)
WBC: 10.7 10*3/uL — AB (ref 4.0–10.5)

## 2015-07-14 LAB — PROTIME-INR
INR: 2.88 — ABNORMAL HIGH (ref 0.00–1.49)
PROTHROMBIN TIME: 29.7 s — AB (ref 11.6–15.2)

## 2015-07-14 MED ORDER — TETANUS-DIPHTH-ACELL PERTUSSIS 5-2.5-18.5 LF-MCG/0.5 IM SUSP
0.5000 mL | Freq: Once | INTRAMUSCULAR | Status: AC
  Administered 2015-07-14: 0.5 mL via INTRAMUSCULAR
  Filled 2015-07-14: qty 0.5

## 2015-07-14 NOTE — ED Provider Notes (Signed)
CSN: [id number]     Arrival date & time 07/14/15  2035 History  By signing my name below, I, Meriel Pica, attest that this documentation has been prepared under the direction and in the presence of [employer], PA-C. Electronically Signed: Meriel Pica, ED Scribe. 07/14/2015. 10:57 PM.   Chief Complaint  Patient presents with  . Epistaxis   The history is provided by the patient. No language interpreter was used.   HPI Comments: Aaron Frye is a 82 y.o. male, with a PMhx of CVA on Coumadin therapy, who presents to the Emergency Department via EMS complaining of epistaxis onset 6 hours. The pt reports severe bleeding for approximately 2 hours, that resolved while in the ED waiting room with pressure applied.  He is unsure which nares was bleeding. Pt is actively spitting up blood during history. Pt has a PMhx of CVA and has been on Coumadin for 15 years. He admits to feeling mildly light-headed currently but denies experiencing this during active epistaxis. Pt denies having to be evaluated for epistaxis by ENT in the past, light-headedness during epistaxis, a pre syncopal feeling, chest pain, or SOB.   Past Medical History  Diagnosis Date  . TIA (transient ischemic attack)   . HLD (hyperlipidemia)   . HTN (hypertension)   . CVA (cerebral vascular accident) ([place])   . Atrial fibrillation ([place])     Persisent   . Prostate cancer ([place])   . Non-ischemic cardiomyopathy ([place])     LVEF=35-40% by echo 2009  . Femur fracture, right [facility]) March 2016   Past Surgical History  Procedure Laterality Date  . Appendectomy    . Tonsillectomy    . Intramedullary (im) nail intertrochanteric Right 09/24/2014    Procedure: INTRAMEDULLARY (IM) NAIL INTERTROCHANTRIC;  Surgeon: Rod Can, MD;  Location: [place];  Service: Orthopedics;  Laterality: Right;   Family History  Problem Relation Age of Onset  . Heart failure Mother   . Lung disease Father   . Cancer Brother     Not sure which  type of cancer  . Heart attack Brother   . Stroke Neg Hx   . Hypertension Son    Social History  Substance Use Topics  . Smoking status: Never Smoker   . Smokeless tobacco: None  . Alcohol Use: No    Review of Systems A complete 10 system review of systems was obtained and is otherwise negative except at noted in the HPI and PMH.  Allergies  Penicillins  Home Medications   Prior to Admission medications   Medication Sig Start Date End Date Taking? Authorizing Provider  aspirin 81 MG tablet Take 81 mg by mouth daily.      Historical Provider, MD  atorvastatin (LIPITOR) 40 MG tablet Take 1 tablet (40 mg total) by mouth daily. 05/10/15   Josue Hector, MD  ferrous sulfate 325 (65 FE) MG tablet Take 1 tablet (325 mg total) by mouth 2 (two) times daily with a meal. 02/14/15   Josue Hector, MD  furosemide (LASIX) 20 MG tablet Take 1 tablet (20 mg total) by mouth 2 (two) times daily. 01/17/15   Josue Hector, MD  HYDROcodone-acetaminophen (NORCO/VICODIN) 5-325 MG per tablet Take 1-2 tablets by mouth every 6 (six) hours as needed for moderate pain. 10/08/14   Lavon Paganini Angiulli, PA-C  loratadine (CLARITIN) 10 MG tablet Take 1 tablet (10 mg total) by mouth daily. 10/08/14   Lavon Paganini Angiulli, PA-C  methocarbamol (ROBAXIN) 500 MG tablet  Take 1 tablet (500 mg total) by mouth every 6 (six) hours as needed for muscle spasms (qid prn). 10/08/14   Lavon Paganini Angiulli, PA-C  metoprolol succinate (TOPROL-XL) 50 MG 24 hr tablet Take 1 tablet (50 mg total) by mouth daily. Take with or immediately following a meal. 01/31/15   Josue Hector, MD  potassium chloride SA (K-DUR,KLOR-CON) 10 MEQ tablet Take 1 tablet (10 mEq total) by mouth 2 (two) times daily. 01/17/15   Josue Hector, MD  ramipril (ALTACE) 5 MG capsule Take 1 capsule (5 mg total) by mouth daily. 10/08/14   Lavon Paganini Angiulli, PA-C  saccharomyces boulardii (FLORASTOR) 250 MG capsule Take 1 capsule (250 mg total) by mouth 2 (two) times daily. 10/08/14    Lavon Paganini Angiulli, PA-C  Sulfamethoxazole-Trimethoprim (BACTRIM PO) Take 1 tablet by mouth 2 (two) times daily.     Historical Provider, MD  warfarin (COUMADIN) 5 MG tablet Take as directed by Coumadin Clinic 04/08/15   Josue Hector, MD   BP 142/68 mmHg  Pulse 85  Temp(Src) 97.3 F (36.3 C) (Oral)  Resp 18  SpO2 96% Physical Exam  Constitutional: He is oriented to person, place, and time. He appears well-developed and well-nourished. No distress.  HENT:  Head: Normocephalic.  Mouth/Throat: Oropharynx is clear and moist.  Bilateral nares with no active bleeding, no scabs visible.   Posterior pharynx clear  Eyes: Conjunctivae are normal. Pupils are equal, round, and reactive to light.  Neck: Normal range of motion. Neck supple.  Cardiovascular: Normal rate and regular rhythm.   Pulmonary/Chest: Effort normal. No respiratory distress.  Musculoskeletal: Normal range of motion.  Neurological: He is alert and oriented to person, place, and time. Coordination normal.  Skin: Skin is warm.  Psychiatric: He has a normal mood and affect. His behavior is normal.  Nursing note and vitals reviewed.   ED Course  Procedures  DIAGNOSTIC STUDIES: Oxygen Saturation is 96% on RA, adequate by my interpretation.    COORDINATION OF CARE: 10:51 PM Discussed treatment plan which includes to consult with attending MD with pt. Pt acknowledges and agrees to plan.   Labs Review Labs Reviewed  PROTIME-INR - Abnormal; Notable for the following:    Prothrombin Time 29.7 (*)    INR 2.88 (*)    All other components within normal limits  CBC - Abnormal; Notable for the following:    WBC 10.7 (*)    Hemoglobin 12.1 (*)    HCT 38.4 (*)    MCH 25.6 (*)    All other components within normal limits  I have personally reviewed and evaluated these lab results as part of my medical decision-making.   MDM   Final diagnoses:  Epistaxis  Anticoagulant long-term use    Filed Vitals:   07/14/15 2046   BP: 142/68  Pulse: 85  Temp: 97.3 F (36.3 C)  TempSrc: Oral  Resp: 18  SpO2: 96%    Medications  Tdap (BOOSTRIX) injection 0.5 mL (0.5 mLs Intramuscular Given 07/14/15 2317)    Aaron Frye is 82 y.o. male presenting with epistaxis onset several hours ago, it bled for one hour and then stopped 1 hour ago. Patient is comfortable, his INR is therapeutic at 2.8. No anemia or signs of symptomatic anemia. Since tetanus is updated and he is observed in the ED for 30 minutes with no rebleeding. Patient is given Afrin to take home, with had an extensive discussion of how to treat the nosebleed if it recurs.  If direct pressure for 15 minutes does not resolve the bleeding he is to call 911 and return immediately to the ED. Patient and his wife verbalized understanding.  Discussed case with attending physician who agrees with care plan and disposition.   Evaluation does not show pathology that would require ongoing emergent intervention or inpatient treatment. Pt is hemodynamically stable and mentating appropriately. Discussed findings and plan with patient/guardian, who agrees with care plan. All questions answered. Return precautions discussed and outpatient follow up given.   I personally performed the services described in this documentation, which was scribed in my presence. The recorded information has been reviewed and is accurate.    Monico Blitz, PA-C 07/14/15 2354  April Palumbo, MD 07/14/15 2354

## 2015-07-14 NOTE — ED Notes (Signed)
Pt stable, ambulatory, states understanding of discharge instructions 

## 2015-07-14 NOTE — ED Notes (Addendum)
Pt states around 8pm his nose started bleeding. Pt states hx of blood thinners (warfin) denies any headache.

## 2015-07-14 NOTE — Discharge Instructions (Signed)
Humidify the air and apply bacitracin to the inside of the nose this will help prevent future nosebleeds. If the bleeding recurs spray Afrin into the nose and hold pressure for at least 10 minutes.  Please follow with your primary care doctor in the next 2 days for a check-up. They must obtain records for further management.   Do not hesitate to return to the Emergency Department for any new, worsening or concerning symptoms.    Nosebleed Nosebleeds are common. They are due to a crack in the inside lining of your nose (mucous membrane) or from a small blood vessel that starts to bleed. Nosebleeds can be caused by many conditions, such as injury, infections, dry mucous membranes or dry climate, medicines, nose picking, and home heating and cooling systems. Most nosebleeds come from blood vessels in the front of your nose. HOME CARE INSTRUCTIONS   Try controlling your nosebleed by pinching your nostrils gently and continuously for at least 10 minutes.  Avoid blowing or sniffing your nose for a number of hours after having a nosebleed.  Do not put gauze inside your nose yourself. If your nose was packed by your health care provider, try to maintain the pack inside of your nose until your health care provider removes it.  If a gauze pack was used and it starts to fall out, gently replace it or cut off the end of it.  If a balloon catheter was used to pack your nose, do not cut or remove it unless your health care provider has instructed you to do that.  Avoid lying down while you are having a nosebleed. Sit up and lean forward.  Use a nasal spray decongestant to help with a nosebleed as directed by your health care provider.  Do not use petroleum jelly or mineral oil in your nose. These can drip into your lungs.  Maintain humidity in your home by using less air conditioning or by using a humidifier.  Aspirinand blood thinners make bleeding more likely. If you are prescribed these medicines  and you suffer from nosebleeds, ask your health care provider if you should stop taking the medicines or adjust the dose. Do not stop medicines unless directed by your health care provider  Resume your normal activities as you are able, but avoid straining, lifting, or bending at the waist for several days.  If your nosebleed was caused by dry mucous membranes, use over-the-counter saline nasal spray or gel. This will keep the mucous membranes moist and allow them to heal. If you must use a lubricant, choose the water-soluble variety. Use it only sparingly, and do not use it within several hours of lying down.  Keep all follow-up visits as directed by your health care provider. This is important. SEEK MEDICAL CARE IF:  You have a fever.  You get frequent nosebleeds.  You are getting nosebleeds more often. SEEK IMMEDIATE MEDICAL CARE IF:  Your nosebleed lasts longer than 20 minutes.  Your nosebleed occurs after an injury to your face, and your nose looks crooked or broken.  You have unusual bleeding from other parts of your body.  You have unusual bruising on other parts of your body.  You feel light-headed or you faint.  You become sweaty.  You vomit blood.  Your nosebleed occurs after a head injury.   This information is not intended to replace advice given to you by your health care provider. Make sure you discuss any questions you have with your health care provider.  Document Released: 03/28/2005 Document Revised: 07/09/2014 Document Reviewed: 02/01/2014 Elsevier Interactive Patient Education [employer].

## 2015-07-15 ENCOUNTER — Emergency Department (HOSPITAL_COMMUNITY)
Admission: EM | Admit: 2015-07-15 | Discharge: 2015-07-15 | Discharge status: Against Medical Advice | Payer: Medicare Other | Source: Home / Self Care

## 2015-07-15 ENCOUNTER — Encounter (HOSPITAL_COMMUNITY): Payer: Self-pay | Admitting: Family Medicine

## 2015-07-15 DIAGNOSIS — I10 Essential (primary) hypertension: Secondary | ICD-10-CM | POA: Insufficient documentation

## 2015-07-15 DIAGNOSIS — R040 Epistaxis: Secondary | ICD-10-CM

## 2015-07-15 DIAGNOSIS — Z8673 Personal history of transient ischemic attack (TIA), and cerebral infarction without residual deficits: Secondary | ICD-10-CM

## 2015-07-15 DIAGNOSIS — Z8546 Personal history of malignant neoplasm of prostate: Secondary | ICD-10-CM

## 2015-07-15 NOTE — ED Notes (Signed)
No answer x3

## 2015-07-15 NOTE — ED Notes (Signed)
No answer x2 

## 2015-07-15 NOTE — ED Notes (Signed)
Pt returning to the ER for nosebleed. He was here last night for the same. sts started when he was making a sandwich and he used nose spray. Pt bleeding currently control.

## 2015-07-18 ENCOUNTER — Telehealth: Payer: Self-pay | Admitting: Cardiovascular Disease

## 2015-07-18 NOTE — Telephone Encounter (Signed)
Pt's wife calling re nose bleed-pt on blood thinner-was given nose spray to help-no bleeding Sat or Sun, woke up this am with bleeding again-pls call wife [phone]

## 2015-07-18 NOTE — Telephone Encounter (Signed)
Called patient, bleeding has stopped after applying pressure for 15 minutes. Informed patient of instructions from ED physician as written below. Patient was instructed to call PCP for a possible ENT referral if needed if nose bleeds keep reoccurring. Patient verbalized understanding.  "Patient is given Afrin to take home, with had an extensive discussion of how to treat the nosebleed if it recurs. If direct pressure for 15 minutes does not resolve the bleeding he is to call 911 and return immediately to the ED. Patient and his wife verbalized understanding."

## 2015-07-25 ENCOUNTER — Other Ambulatory Visit: Payer: Self-pay | Admitting: Cardiovascular Disease

## 2015-08-03 ENCOUNTER — Ambulatory Visit (INDEPENDENT_AMBULATORY_CARE_PROVIDER_SITE_OTHER): Payer: Medicare Other | Admitting: *Deleted

## 2015-08-03 DIAGNOSIS — Z5181 Encounter for therapeutic drug level monitoring: Secondary | ICD-10-CM | POA: Diagnosis not present

## 2015-08-03 DIAGNOSIS — Z7901 Long term (current) use of anticoagulants: Secondary | ICD-10-CM

## 2015-08-03 DIAGNOSIS — I4891 Unspecified atrial fibrillation: Secondary | ICD-10-CM | POA: Diagnosis not present

## 2015-08-03 DIAGNOSIS — I6350 Cerebral infarction due to unspecified occlusion or stenosis of unspecified cerebral artery: Secondary | ICD-10-CM | POA: Diagnosis not present

## 2015-08-03 DIAGNOSIS — G459 Transient cerebral ischemic attack, unspecified: Secondary | ICD-10-CM | POA: Diagnosis not present

## 2015-08-03 LAB — POCT INR: INR: 2.4

## 2015-09-14 ENCOUNTER — Ambulatory Visit (INDEPENDENT_AMBULATORY_CARE_PROVIDER_SITE_OTHER): Payer: Medicare Other | Admitting: *Deleted

## 2015-09-14 ENCOUNTER — Encounter (INDEPENDENT_AMBULATORY_CARE_PROVIDER_SITE_OTHER): Payer: Medicare Other | Admitting: Ophthalmology

## 2015-09-14 DIAGNOSIS — H43813 Vitreous degeneration, bilateral: Secondary | ICD-10-CM

## 2015-09-14 DIAGNOSIS — G459 Transient cerebral ischemic attack, unspecified: Secondary | ICD-10-CM

## 2015-09-14 DIAGNOSIS — Z7901 Long term (current) use of anticoagulants: Secondary | ICD-10-CM

## 2015-09-14 DIAGNOSIS — Z5181 Encounter for therapeutic drug level monitoring: Secondary | ICD-10-CM | POA: Diagnosis not present

## 2015-09-14 DIAGNOSIS — H35033 Hypertensive retinopathy, bilateral: Secondary | ICD-10-CM | POA: Diagnosis not present

## 2015-09-14 DIAGNOSIS — H353211 Exudative age-related macular degeneration, right eye, with active choroidal neovascularization: Secondary | ICD-10-CM

## 2015-09-14 DIAGNOSIS — I4891 Unspecified atrial fibrillation: Secondary | ICD-10-CM | POA: Diagnosis not present

## 2015-09-14 DIAGNOSIS — I10 Essential (primary) hypertension: Secondary | ICD-10-CM | POA: Diagnosis not present

## 2015-09-14 DIAGNOSIS — H353132 Nonexudative age-related macular degeneration, bilateral, intermediate dry stage: Secondary | ICD-10-CM | POA: Diagnosis not present

## 2015-09-14 DIAGNOSIS — I6350 Cerebral infarction due to unspecified occlusion or stenosis of unspecified cerebral artery: Secondary | ICD-10-CM | POA: Diagnosis not present

## 2015-09-14 DIAGNOSIS — H35371 Puckering of macula, right eye: Secondary | ICD-10-CM

## 2015-09-14 LAB — POCT INR: INR: 2.1

## 2015-11-04 ENCOUNTER — Ambulatory Visit (INDEPENDENT_AMBULATORY_CARE_PROVIDER_SITE_OTHER): Payer: Medicare Other

## 2015-11-04 ENCOUNTER — Ambulatory Visit (INDEPENDENT_AMBULATORY_CARE_PROVIDER_SITE_OTHER): Payer: Medicare Other | Admitting: Cardiovascular Disease

## 2015-11-04 ENCOUNTER — Encounter: Payer: Self-pay | Admitting: Cardiovascular Disease

## 2015-11-04 VITALS — BP 110/60 | HR 77 | Ht 65.0 in | Wt 158.8 lb

## 2015-11-04 DIAGNOSIS — I6350 Cerebral infarction due to unspecified occlusion or stenosis of unspecified cerebral artery: Secondary | ICD-10-CM

## 2015-11-04 DIAGNOSIS — Z7901 Long term (current) use of anticoagulants: Secondary | ICD-10-CM | POA: Diagnosis not present

## 2015-11-04 DIAGNOSIS — I4891 Unspecified atrial fibrillation: Secondary | ICD-10-CM | POA: Diagnosis not present

## 2015-11-04 DIAGNOSIS — I482 Chronic atrial fibrillation, unspecified: Secondary | ICD-10-CM

## 2015-11-04 DIAGNOSIS — Z5181 Encounter for therapeutic drug level monitoring: Secondary | ICD-10-CM

## 2015-11-04 DIAGNOSIS — G459 Transient cerebral ischemic attack, unspecified: Secondary | ICD-10-CM

## 2015-11-04 LAB — POCT INR: INR: 2.4

## 2015-11-04 MED ORDER — RAMIPRIL 5 MG PO CAPS: 5.0000 mg | ORAL_CAPSULE | Freq: Every day | ORAL | Status: DC

## 2015-11-04 NOTE — Patient Instructions (Signed)

## 2015-11-04 NOTE — Progress Notes (Signed)
Patient ID: Aaron Frye, male   DOB: 1925/03/06, 82 y.o.   MRN: [id number]   11/04/2015 GAVYNN POLE   1924-09-20  [id number]  Primary Physician Stephens Shire, MD Primary Cardiologist: Johnsie Cancel   Reason for Visit/CC: Routine follow-up for atrial fibrillation and hypertension  HPI:  The patient is a 82 year old male,  with a history of hypertension, chronic atrial fibrillation and prior h/o CVA. He is on chronic anticoagulation with Coumadin for stroke prophylaxis. His INRs are followed in our office. His most recent echocardiogram April 2016 revealed normal left ventricular systolic function. Ejection fraction was 60-65%. His last office visit with me  was 01/17/2015. He was felt to be stable from a cardiac standpoint.  He was instructed to follow-up in 3 months for repeat evaluation.  03/09/15 saw PA  concenred that he  noticed black stools. He is unsure if this is melena or due to Fe supplementation. Hct was 40.1 no guaics given to patient   His BP is well controlled today at 120/60. His afib is well controlled with a ventricular rate of 71 bpm. He is asymptomatic. He is fully compliant with Coumadin. INR is therapeutic at 2.9. He is pretty stable on his feet. No recent falls. He also has a h/o venous ulcers which has been managed at the [facility] wound center.   Nose bleeds seen in ER 07/15/15  INR was Rx at 2.88  Needs INR today Legs doing better no edema Wished him an early happy birthday He was born on mothers day   Current Outpatient Prescriptions  Medication Sig Dispense Refill  . aspirin 81 MG tablet Take 81 mg by mouth daily.      [name] atorvastatin (LIPITOR) 40 MG tablet Take 1 tablet (40 mg total) by mouth daily. 90 tablet 3  . ferrous sulfate 325 (65 FE) MG tablet Take 1 tablet (325 mg total) by mouth 2 (two) times daily with a meal. 60 tablet 11  . furosemide (LASIX) 20 MG tablet Take 1 tablet (20 mg total) by mouth 2 (two) times daily. 60 tablet 11  . HYDROcodone-acetaminophen  (NORCO/VICODIN) 5-325 MG per tablet Take 1-2 tablets by mouth every 6 (six) hours as needed for moderate pain. 90 tablet 0  . loratadine (CLARITIN) 10 MG tablet Take 1 tablet (10 mg total) by mouth daily. 30 tablet 1  . methocarbamol (ROBAXIN) 500 MG tablet Take 1 tablet (500 mg total) by mouth every 6 (six) hours as needed for muscle spasms (qid prn). 90 tablet 0  . metoprolol succinate (TOPROL-XL) 50 MG 24 hr tablet Take 1 tablet (50 mg total) by mouth daily. Take with or immediately following a meal. 30 tablet 11  . potassium chloride SA (K-DUR,KLOR-CON) 10 MEQ tablet Take 1 tablet (10 mEq total) by mouth 2 (two) times daily. 60 tablet 11  . ramipril (ALTACE) 5 MG capsule Take 1 capsule (5 mg total) by mouth daily. 30 capsule 5  . saccharomyces boulardii (FLORASTOR) 250 MG capsule Take 1 capsule (250 mg total) by mouth 2 (two) times daily. 60 capsule 1  . Sulfamethoxazole-Trimethoprim (BACTRIM PO) Take 1 tablet by mouth 2 (two) times daily.     [name] warfarin (COUMADIN) 5 MG tablet TAKE AS DIRECTED BY MOUTH BY COUMADIN CLINIC 40 tablet 3   No current facility-administered medications for this visit.    Allergies  Allergen Reactions  . Penicillins Rash    Social History   Social History  . Marital Status: Married    Spouse Name:  N/A  . Number of Children: N/A  . Years of Education: N/A   Occupational History  . [employer]    Social History Main Topics  . Smoking status: Never Smoker   . Smokeless tobacco: Not on file  . Alcohol Use: No  . Drug Use: No  . Sexual Activity: Not on file   Other Topics Concern  . Not on file   Social History Narrative     Review of Systems: General: negative for chills, fever, night sweats or weight changes.  Cardiovascular: negative for chest pain, dyspnea on exertion, edema, orthopnea, palpitations, paroxysmal nocturnal dyspnea or shortness of breath Dermatological: negative for rash Respiratory: negative for cough or wheezing Urologic:  negative for hematuria Abdominal: negative for nausea, vomiting, diarrhea, bright red blood per rectum, melena, or hematemesis Neurologic: negative for visual changes, syncope, or dizziness All other systems reviewed and are otherwise negative except as noted above.    There were no vitals taken for this visit.  General appearance: alert, cooperative and no distress Neck: no carotid bruit and no JVD Lungs: clear to auscultation bilaterally Heart: irregularly irregular rhythm and regular rate Extremities: no LEE; both legs wraped in ace-wrap Pulses: 2+ and symmetric Skin: warm and dry Neurologic: Grossly normal  EKG atrial fibrillation with a CVR 71 bpm   ASSESSMENT AND PLAN:   1. Chronic atrial fibrillation: rate is well controlled on metroprolol. INR is therapuetic on Coumadin at 2.9.   2. HTN: BP is well controlled on current regimen.  3. LEE Venous Ulcers: Followed at [facility] wound clinic. Both legs are wraped in dressings. Currently on antibiotics.   4. H/O CVA: no recent stroke-like symptoms. Fully compliant with Coumadin. INR is therapeutic.   5. Black Stools: Hct stable likely from iron Rx  6. Epistaxis resolved INR today f/u ENT  Jenkins Rouge

## 2015-11-23 ENCOUNTER — Encounter (INDEPENDENT_AMBULATORY_CARE_PROVIDER_SITE_OTHER): Payer: Medicare Other | Admitting: Ophthalmology

## 2015-11-23 DIAGNOSIS — H353211 Exudative age-related macular degeneration, right eye, with active choroidal neovascularization: Secondary | ICD-10-CM | POA: Diagnosis not present

## 2015-11-23 DIAGNOSIS — H43813 Vitreous degeneration, bilateral: Secondary | ICD-10-CM

## 2015-11-23 DIAGNOSIS — H35033 Hypertensive retinopathy, bilateral: Secondary | ICD-10-CM | POA: Diagnosis not present

## 2015-11-23 DIAGNOSIS — H353122 Nonexudative age-related macular degeneration, left eye, intermediate dry stage: Secondary | ICD-10-CM

## 2015-11-23 DIAGNOSIS — I10 Essential (primary) hypertension: Secondary | ICD-10-CM

## 2015-12-05 ENCOUNTER — Other Ambulatory Visit: Payer: Self-pay | Admitting: Cardiovascular Disease

## 2015-12-16 ENCOUNTER — Ambulatory Visit (INDEPENDENT_AMBULATORY_CARE_PROVIDER_SITE_OTHER): Payer: Medicare Other | Admitting: *Deleted

## 2015-12-16 DIAGNOSIS — Z7901 Long term (current) use of anticoagulants: Secondary | ICD-10-CM | POA: Diagnosis not present

## 2015-12-16 DIAGNOSIS — Z5181 Encounter for therapeutic drug level monitoring: Secondary | ICD-10-CM | POA: Diagnosis not present

## 2015-12-16 DIAGNOSIS — G459 Transient cerebral ischemic attack, unspecified: Secondary | ICD-10-CM | POA: Diagnosis not present

## 2015-12-16 DIAGNOSIS — I4891 Unspecified atrial fibrillation: Secondary | ICD-10-CM | POA: Diagnosis not present

## 2015-12-16 DIAGNOSIS — I6350 Cerebral infarction due to unspecified occlusion or stenosis of unspecified cerebral artery: Secondary | ICD-10-CM

## 2015-12-16 DIAGNOSIS — I482 Chronic atrial fibrillation, unspecified: Secondary | ICD-10-CM

## 2015-12-16 LAB — POCT INR: INR: 2.7

## 2016-01-27 ENCOUNTER — Ambulatory Visit (INDEPENDENT_AMBULATORY_CARE_PROVIDER_SITE_OTHER): Payer: Medicare Other | Admitting: *Deleted

## 2016-01-27 DIAGNOSIS — G459 Transient cerebral ischemic attack, unspecified: Secondary | ICD-10-CM | POA: Diagnosis not present

## 2016-01-27 DIAGNOSIS — I6350 Cerebral infarction due to unspecified occlusion or stenosis of unspecified cerebral artery: Secondary | ICD-10-CM | POA: Diagnosis not present

## 2016-01-27 DIAGNOSIS — Z5181 Encounter for therapeutic drug level monitoring: Secondary | ICD-10-CM

## 2016-01-27 DIAGNOSIS — I4891 Unspecified atrial fibrillation: Secondary | ICD-10-CM | POA: Diagnosis not present

## 2016-01-27 DIAGNOSIS — Z7901 Long term (current) use of anticoagulants: Secondary | ICD-10-CM

## 2016-01-27 LAB — POCT INR: INR: 2.1

## 2016-02-06 ENCOUNTER — Other Ambulatory Visit: Payer: Self-pay | Admitting: Cardiovascular Disease

## 2016-02-15 ENCOUNTER — Encounter (INDEPENDENT_AMBULATORY_CARE_PROVIDER_SITE_OTHER): Payer: Medicare Other | Admitting: Ophthalmology

## 2016-02-15 DIAGNOSIS — H43813 Vitreous degeneration, bilateral: Secondary | ICD-10-CM

## 2016-02-15 DIAGNOSIS — H35033 Hypertensive retinopathy, bilateral: Secondary | ICD-10-CM

## 2016-02-15 DIAGNOSIS — I10 Essential (primary) hypertension: Secondary | ICD-10-CM

## 2016-02-15 DIAGNOSIS — H353211 Exudative age-related macular degeneration, right eye, with active choroidal neovascularization: Secondary | ICD-10-CM

## 2016-02-15 DIAGNOSIS — H35373 Puckering of macula, bilateral: Secondary | ICD-10-CM

## 2016-02-15 DIAGNOSIS — H353122 Nonexudative age-related macular degeneration, left eye, intermediate dry stage: Secondary | ICD-10-CM | POA: Diagnosis not present

## 2016-02-20 IMAGING — CT CT CHEST W/O CM
2 of 4 series · 15 of 36 positions shown, 18 images · non-contrast
Comparison: None.

CLINICAL DATA: Left chest wall swelling

EXAM:
CT CHEST WITHOUT CONTRAST
TECHNIQUE: Multidetector CT imaging of the chest was performed following the
standard protocol without IV contrast..

[Series 2: thorax 5.0 i31f 1 · axial · 0.81mm/px · z∈[-308,-28]mm · 12 of 66 slices shown, 15 images]
[im 5/66  mediastinal]
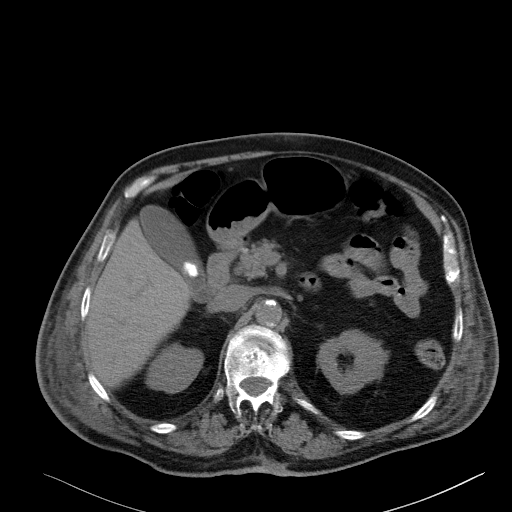
[im 5/66  lung]
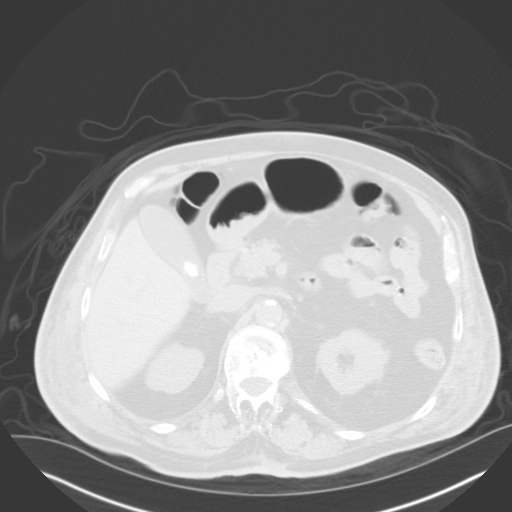
[im 9/66  lung]
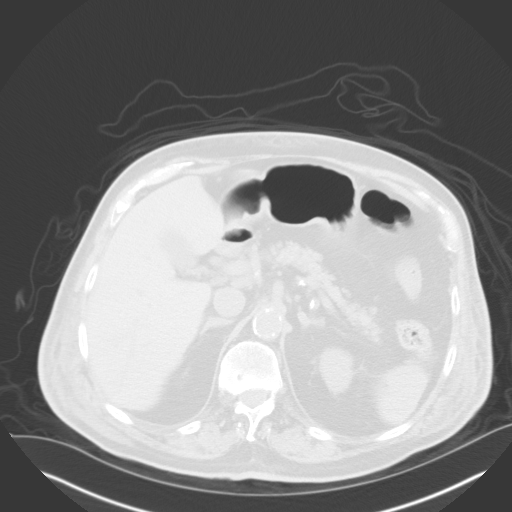
[im 14/66  lung]
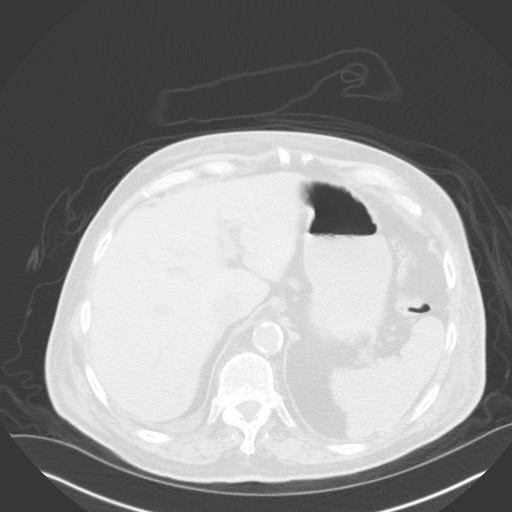
[im 22/66  lung]
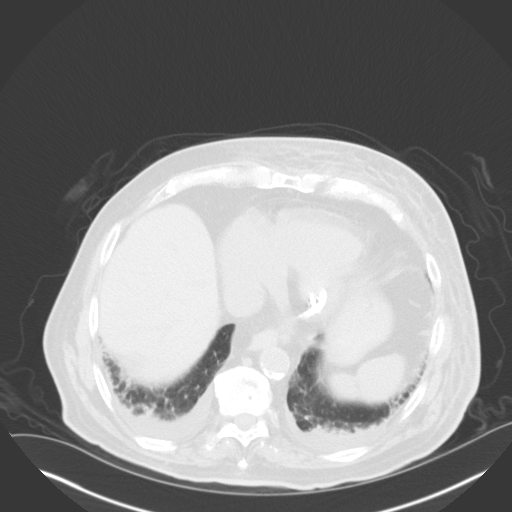
[im 27/66  mediastinal]
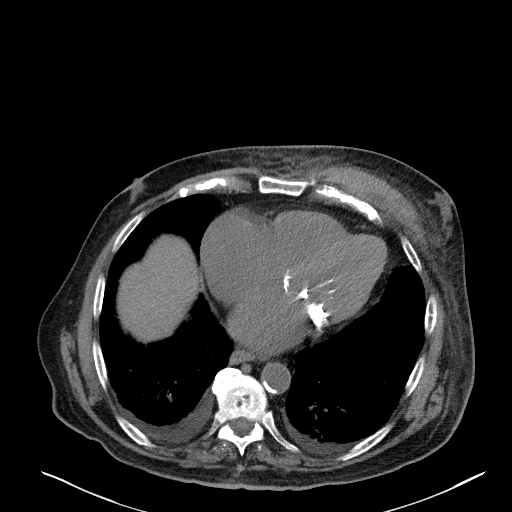
[im 27/66  lung]
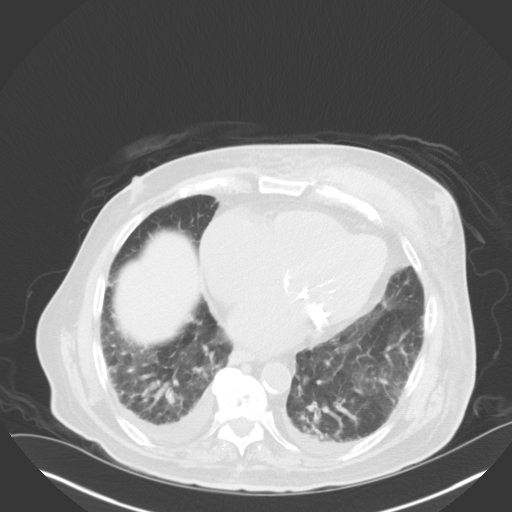
[im 31/66  lung]
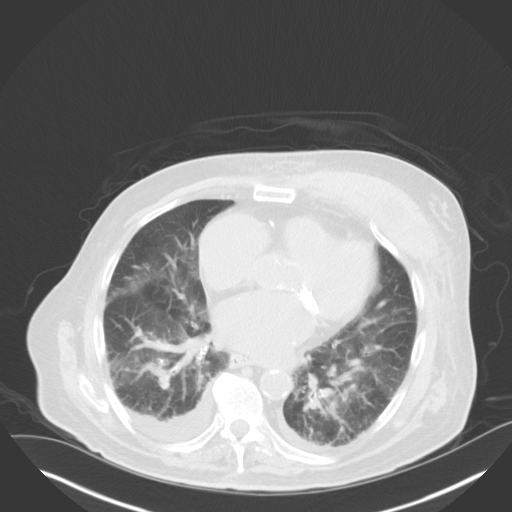
[im 35/66  lung]
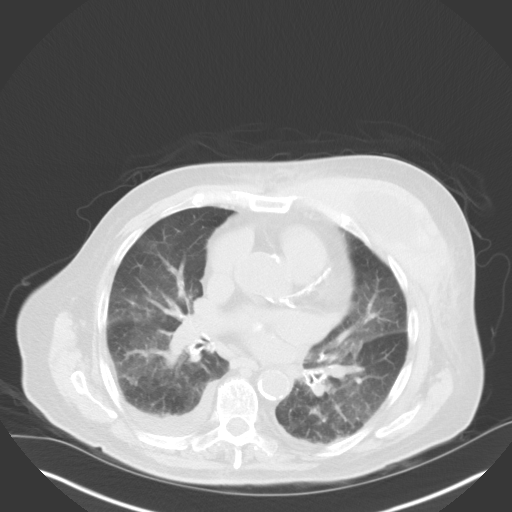
[im 40/66  lung]
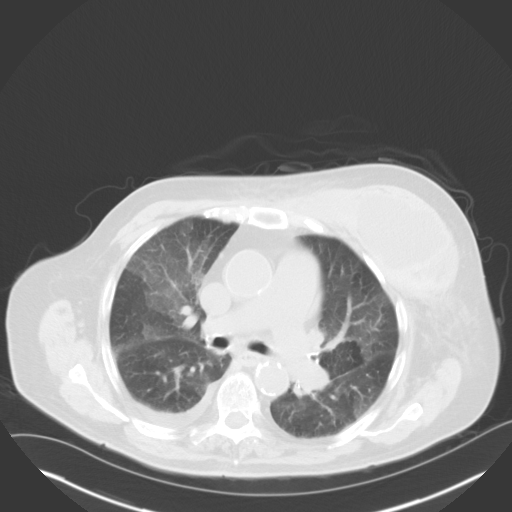
[im 44/66  mediastinal]
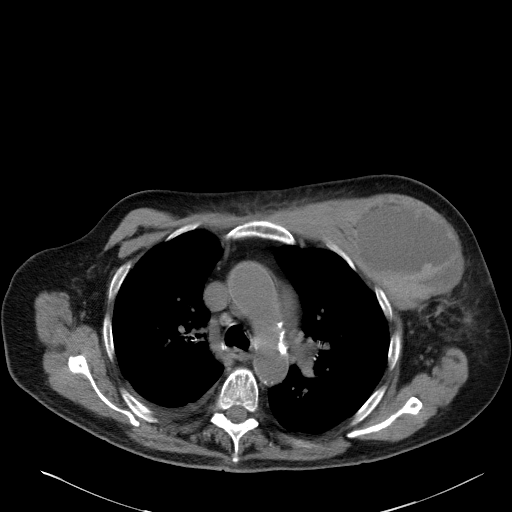
[im 44/66  lung]
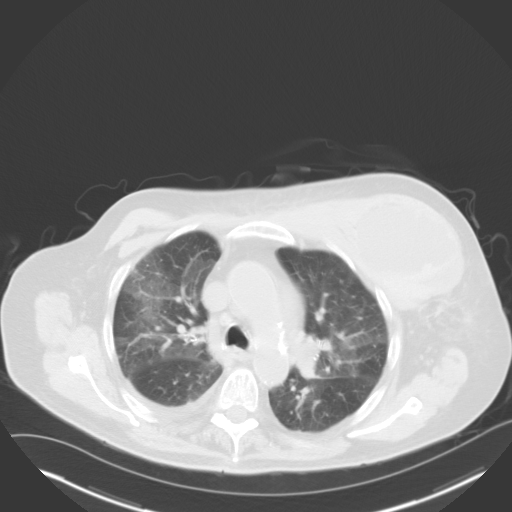
[im 53/66  lung]
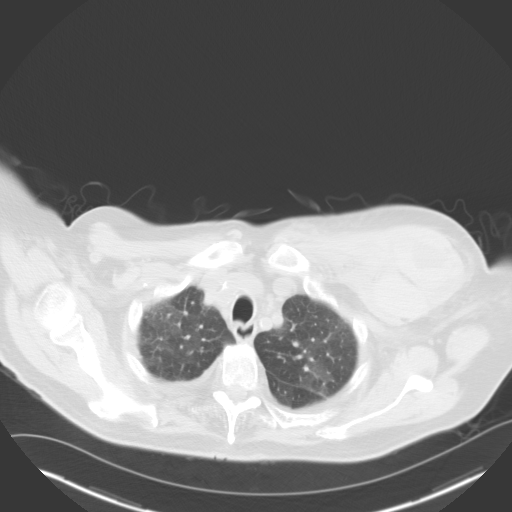
[im 57/66  lung]
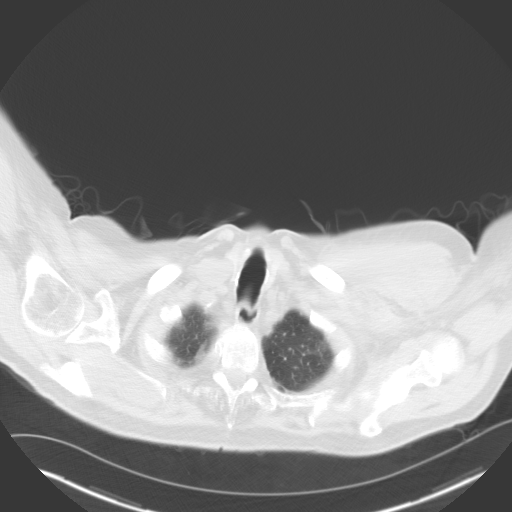
[im 61/66  lung]
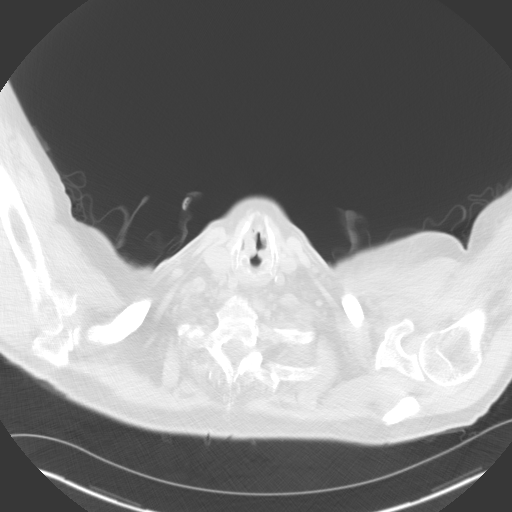

[Series 5: coronal · coronal · 0.64mm/px · 3 of 91 slices shown]
[im 19/91  lung]
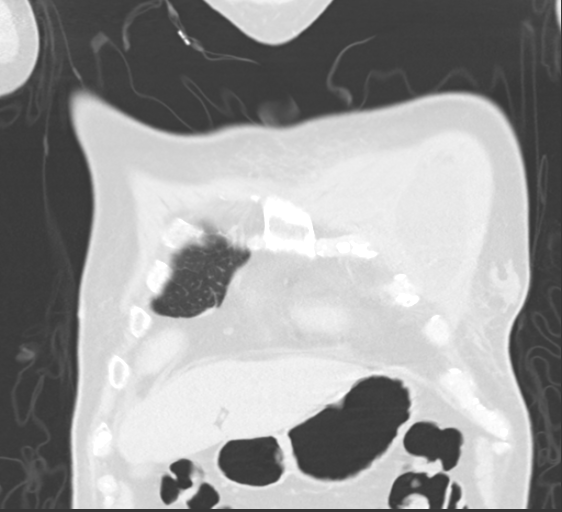
[im 37/91  lung]
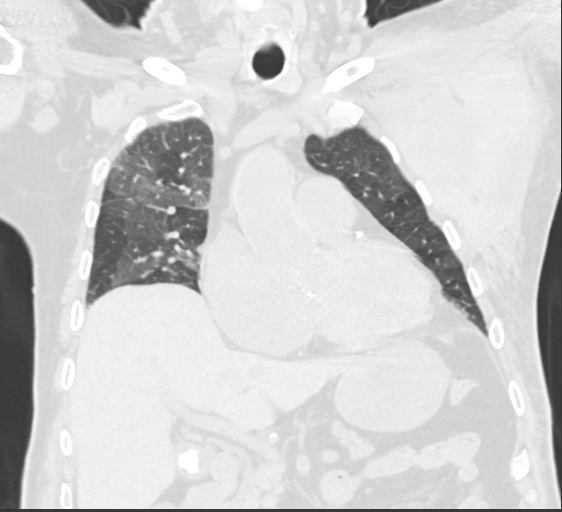
[im 55/91  lung]
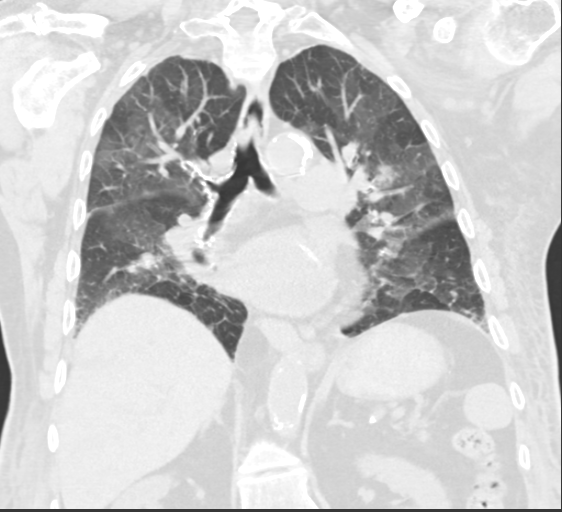

[15 of 36 positions shown; findings below may reference images not displayed]

FINDINGS: Mediastinum/Nodes: Heart is top-normal in size. No pericardial
effusion.

Hypodense blood pool relative to myocardium, reflecting a anemia.

Coronary atherosclerosis.

Atherosclerotic calcifications of the aortic arch and mitral valve
annulus.

Mildly prominent mediastinal lymph nodes, including a 12 mm short
axis subcarinal node (series 2/image 28), likely reactive.

Visualized thyroid is grossly unremarkable.

Lungs/Pleura: Mild ground-glass opacity in the bilateral lungs is
favored to reflect mild interstitial edema.

Small bilateral pleural effusions.

No suspicious pulmonary nodules.

No pneumothorax.

Upper abdomen: Visualized upper abdomen is notable for vascular
calcifications, a 16 mm layering gallstone, and a 2.8 cm posterior
right upper pole renal cyst.

Musculoskeletal: Degenerative changes of the visualized
thoracolumbar spine.

7.5 x 8.5 x 13.2 cm acute intramuscular hematoma in the lateral left
pectoralis muscle with layering hematocrit level (series 2/image
19).

Associated mild soft tissue stranding in the left chest wall.
IMPRESSION: 7.5 x 8.5 x 13.2 cm acute intramuscular hematoma in the lateral left
pectoralis muscle.

Suspected mild interstitial edema with small bilateral pleural
effusions.

## 2016-02-23 IMAGING — CR DG CHEST 2V
2 series · 2 of 2 positions shown · non-contrast
Comparison: 10/01/2014

CLINICAL DATA: Shortness of breath, weakness

EXAM:
CHEST  2 VIEW

[chest lat]
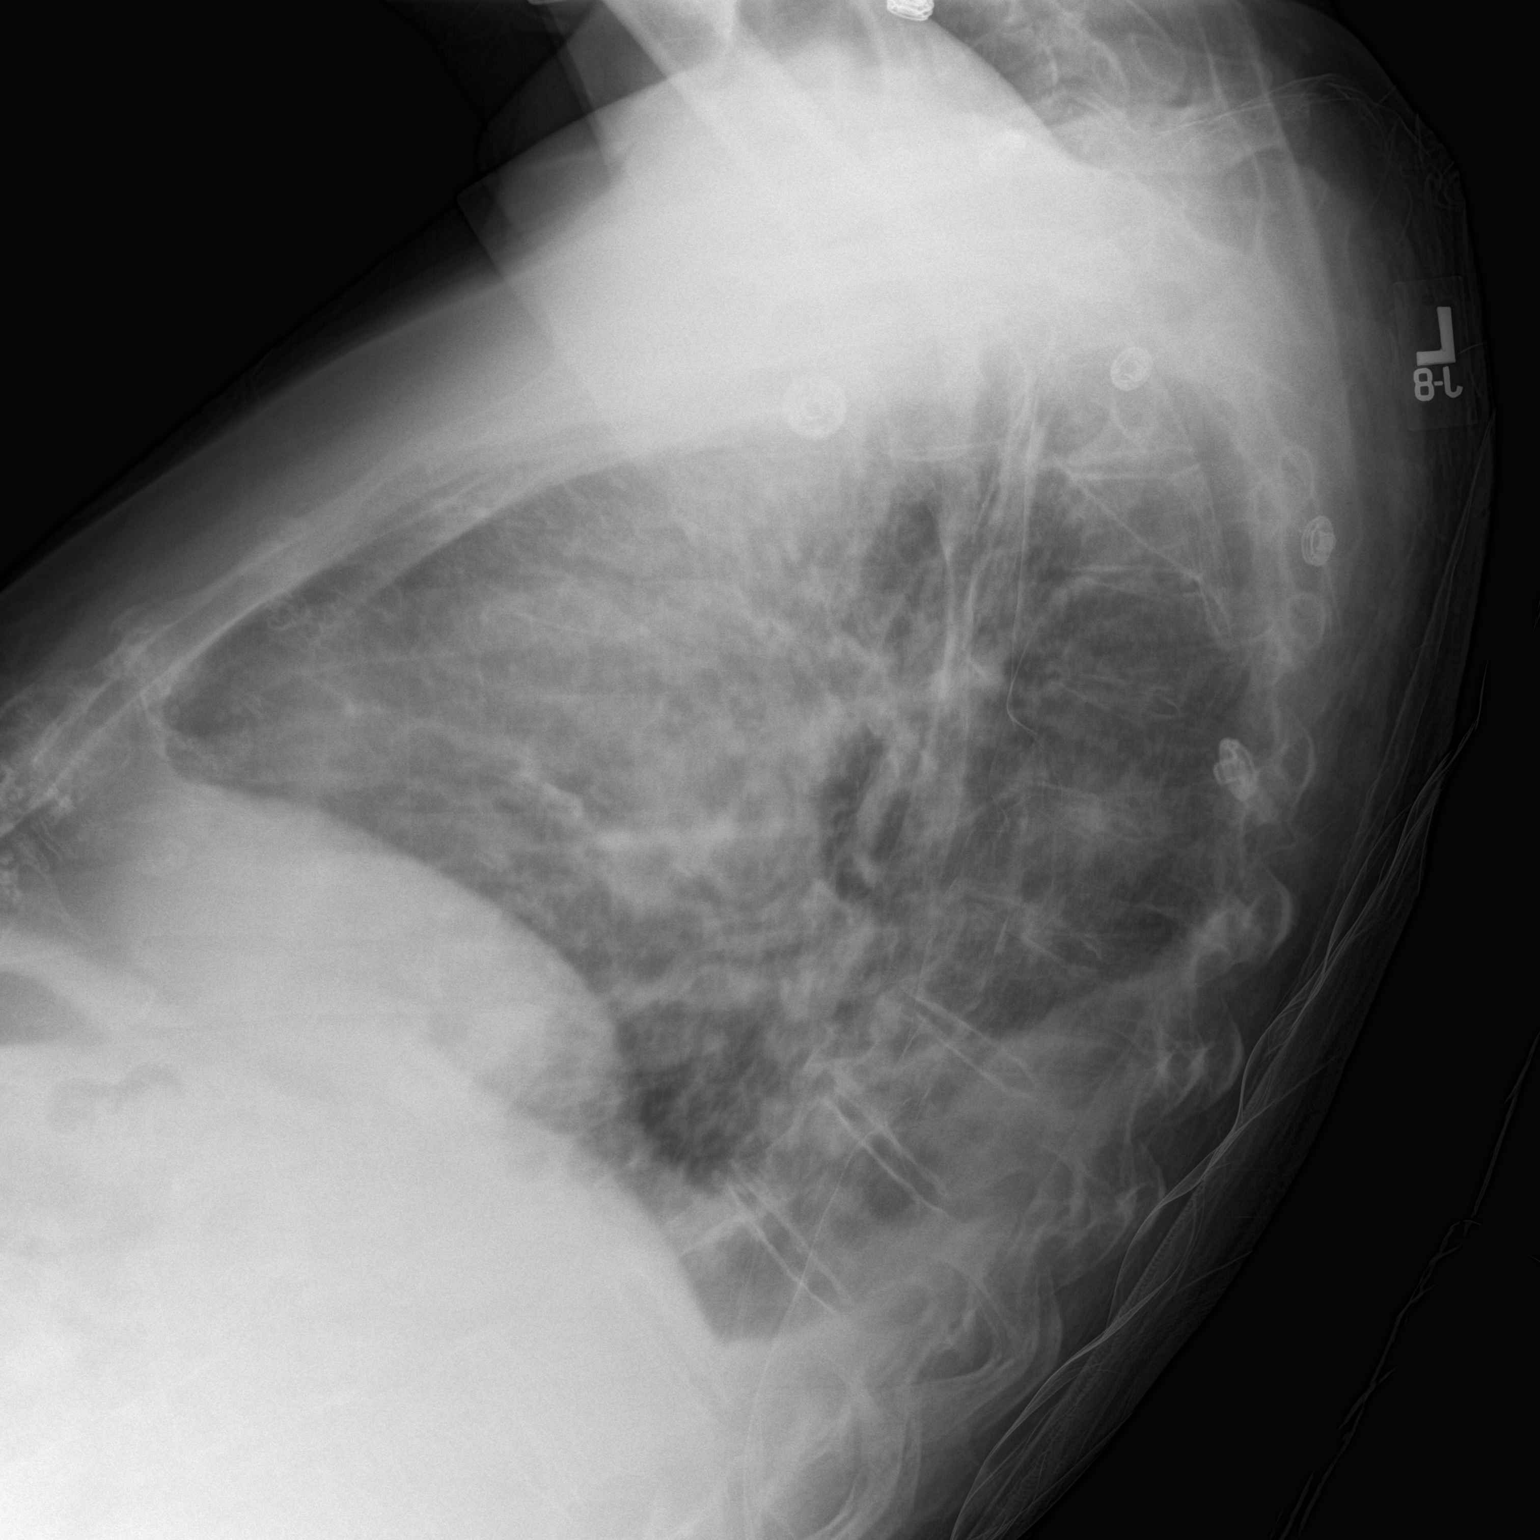

[chest ap]
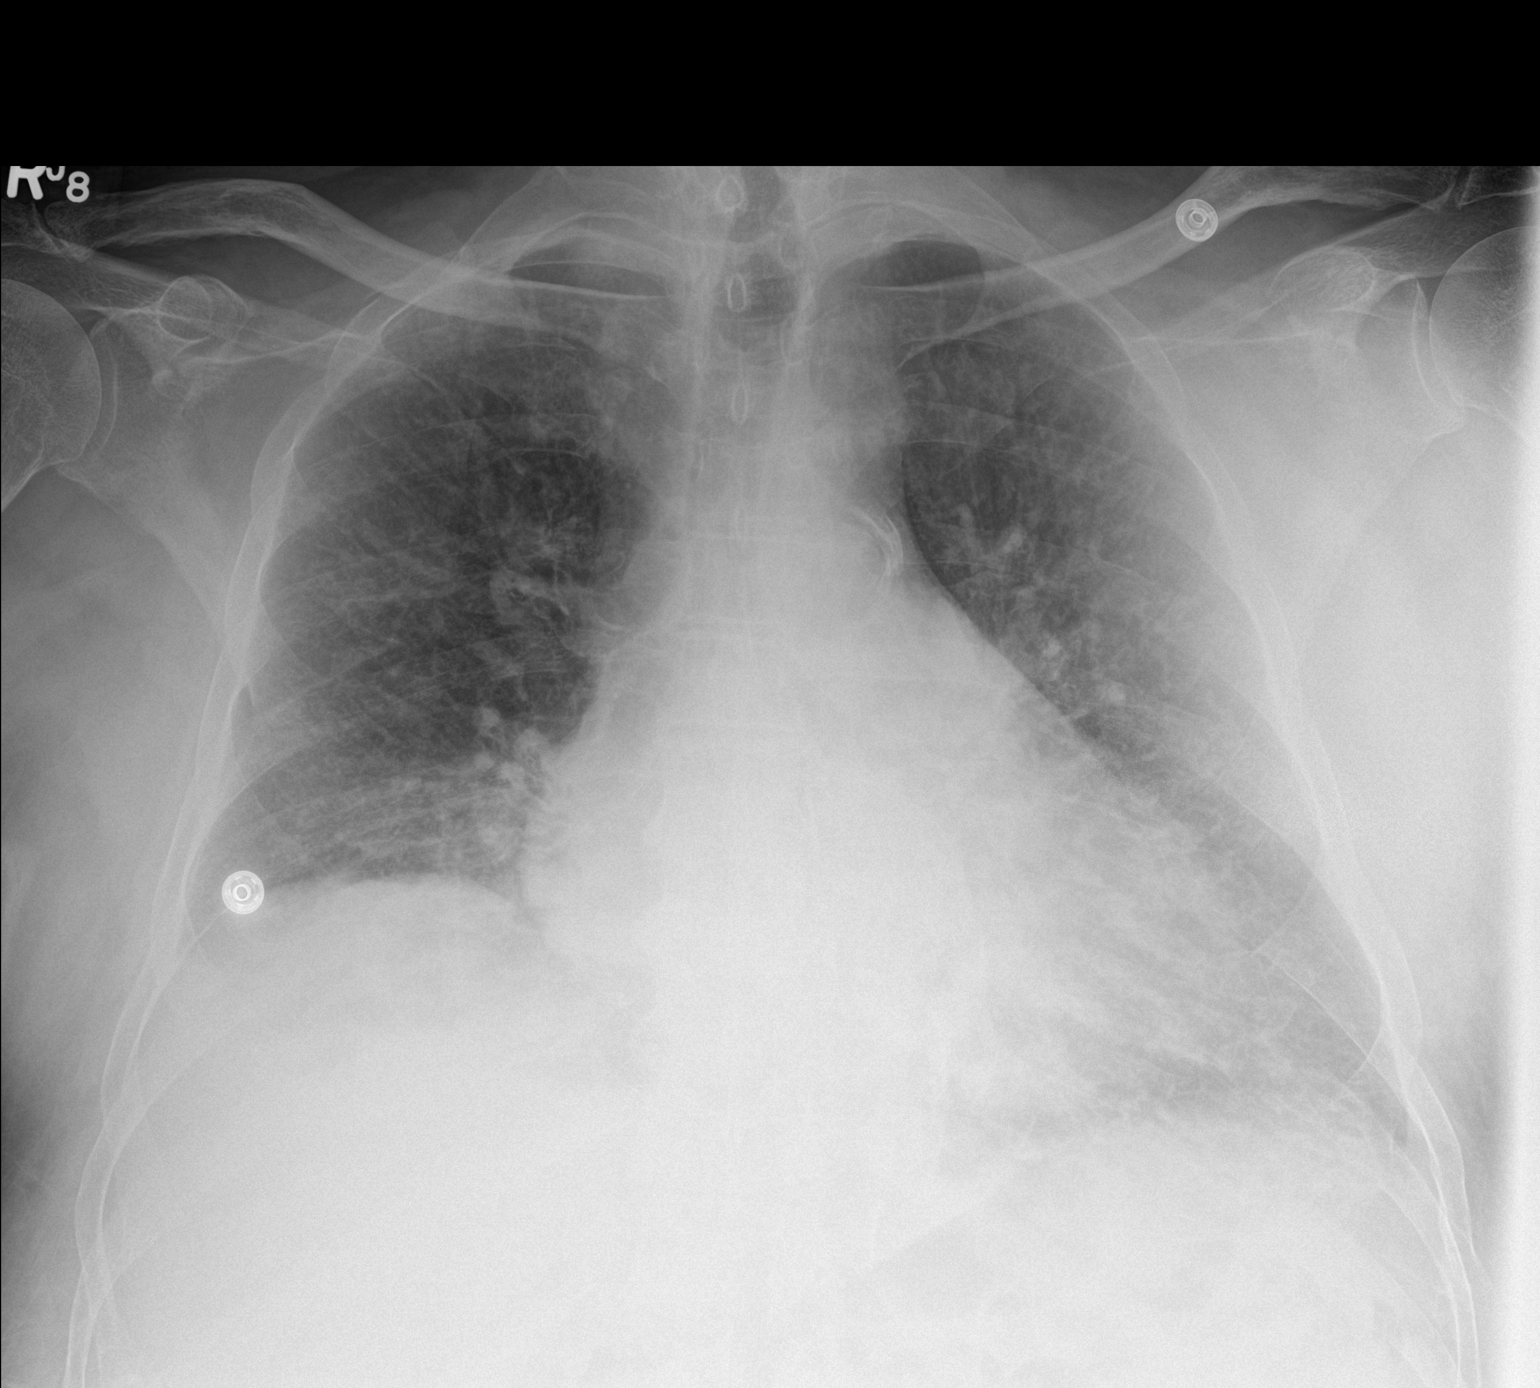

[2 of 2 positions shown; findings below may reference images not displayed]

FINDINGS: Cardiomegaly again noted. Mild interstitial prominence bilateral
without convincing pulmonary edema. Trace bilateral pleural effusion
with bilateral basilar atelectasis or infiltrate. Osteopenia and
mild degenerative changes thoracic spine.
IMPRESSION: Mild interstitial prominence bilaterally without pulmonary edema.
Trace bilateral pleural effusion with bilateral basilar atelectasis
or infiltrate.

## 2016-03-05 ENCOUNTER — Other Ambulatory Visit: Payer: Self-pay | Admitting: Cardiovascular Disease

## 2016-03-06 NOTE — Telephone Encounter (Signed)
Aaron Barter, RN  You 18 minutes ago (5:13 PM)    Needs to go to PCP (Routing comment

## 2016-03-07 ENCOUNTER — Other Ambulatory Visit: Payer: Self-pay | Admitting: Cardiovascular Disease

## 2016-03-09 ENCOUNTER — Ambulatory Visit (INDEPENDENT_AMBULATORY_CARE_PROVIDER_SITE_OTHER): Payer: Medicare Other | Admitting: Pharmacist

## 2016-03-09 DIAGNOSIS — I4891 Unspecified atrial fibrillation: Secondary | ICD-10-CM

## 2016-03-09 DIAGNOSIS — G459 Transient cerebral ischemic attack, unspecified: Secondary | ICD-10-CM | POA: Diagnosis not present

## 2016-03-09 DIAGNOSIS — Z7901 Long term (current) use of anticoagulants: Secondary | ICD-10-CM

## 2016-03-09 DIAGNOSIS — I6350 Cerebral infarction due to unspecified occlusion or stenosis of unspecified cerebral artery: Secondary | ICD-10-CM

## 2016-03-09 DIAGNOSIS — Z5181 Encounter for therapeutic drug level monitoring: Secondary | ICD-10-CM

## 2016-03-09 LAB — POCT INR: INR: 2.9

## 2016-03-11 ENCOUNTER — Other Ambulatory Visit: Payer: Self-pay | Admitting: Cardiovascular Disease

## 2016-03-14 ENCOUNTER — Other Ambulatory Visit: Payer: Self-pay | Admitting: Cardiovascular Disease

## 2016-03-26 ENCOUNTER — Other Ambulatory Visit: Payer: Self-pay | Admitting: Cardiovascular Disease

## 2016-03-28 ENCOUNTER — Other Ambulatory Visit: Payer: Self-pay | Admitting: Cardiovascular Disease

## 2016-04-20 ENCOUNTER — Ambulatory Visit (INDEPENDENT_AMBULATORY_CARE_PROVIDER_SITE_OTHER): Payer: Medicare Other | Admitting: *Deleted

## 2016-04-20 DIAGNOSIS — G459 Transient cerebral ischemic attack, unspecified: Secondary | ICD-10-CM | POA: Diagnosis not present

## 2016-04-20 DIAGNOSIS — I4891 Unspecified atrial fibrillation: Secondary | ICD-10-CM | POA: Diagnosis not present

## 2016-04-20 DIAGNOSIS — Z7901 Long term (current) use of anticoagulants: Secondary | ICD-10-CM | POA: Diagnosis not present

## 2016-04-20 DIAGNOSIS — I6350 Cerebral infarction due to unspecified occlusion or stenosis of unspecified cerebral artery: Secondary | ICD-10-CM | POA: Diagnosis not present

## 2016-04-20 DIAGNOSIS — Z5181 Encounter for therapeutic drug level monitoring: Secondary | ICD-10-CM

## 2016-04-20 LAB — POCT INR: INR: 3.1

## 2016-04-23 ENCOUNTER — Other Ambulatory Visit: Payer: Self-pay | Admitting: Cardiovascular Disease

## 2016-05-09 ENCOUNTER — Encounter (INDEPENDENT_AMBULATORY_CARE_PROVIDER_SITE_OTHER): Payer: Medicare Other | Admitting: Ophthalmology

## 2016-05-14 ENCOUNTER — Encounter (INDEPENDENT_AMBULATORY_CARE_PROVIDER_SITE_OTHER): Payer: Medicare Other | Admitting: Ophthalmology

## 2016-05-16 ENCOUNTER — Encounter (INDEPENDENT_AMBULATORY_CARE_PROVIDER_SITE_OTHER): Payer: Medicare Other | Admitting: Ophthalmology

## 2016-05-16 DIAGNOSIS — H353122 Nonexudative age-related macular degeneration, left eye, intermediate dry stage: Secondary | ICD-10-CM

## 2016-05-16 DIAGNOSIS — I10 Essential (primary) hypertension: Secondary | ICD-10-CM

## 2016-05-16 DIAGNOSIS — H353211 Exudative age-related macular degeneration, right eye, with active choroidal neovascularization: Secondary | ICD-10-CM

## 2016-05-16 DIAGNOSIS — H35033 Hypertensive retinopathy, bilateral: Secondary | ICD-10-CM

## 2016-05-16 DIAGNOSIS — H35373 Puckering of macula, bilateral: Secondary | ICD-10-CM | POA: Diagnosis not present

## 2016-05-16 DIAGNOSIS — H43813 Vitreous degeneration, bilateral: Secondary | ICD-10-CM | POA: Diagnosis not present

## 2016-06-01 ENCOUNTER — Ambulatory Visit (INDEPENDENT_AMBULATORY_CARE_PROVIDER_SITE_OTHER): Payer: Medicare Other

## 2016-06-01 DIAGNOSIS — Z7901 Long term (current) use of anticoagulants: Secondary | ICD-10-CM

## 2016-06-01 DIAGNOSIS — I6350 Cerebral infarction due to unspecified occlusion or stenosis of unspecified cerebral artery: Secondary | ICD-10-CM

## 2016-06-01 DIAGNOSIS — G459 Transient cerebral ischemic attack, unspecified: Secondary | ICD-10-CM | POA: Diagnosis not present

## 2016-06-01 DIAGNOSIS — I4891 Unspecified atrial fibrillation: Secondary | ICD-10-CM

## 2016-06-01 DIAGNOSIS — Z5181 Encounter for therapeutic drug level monitoring: Secondary | ICD-10-CM

## 2016-06-01 LAB — POCT INR: INR: 2.5

## 2016-06-07 ENCOUNTER — Encounter: Payer: Self-pay | Admitting: Cardiovascular Disease

## 2016-06-07 NOTE — Progress Notes (Signed)
Patient ID: Aaron Frye, male   DOB: 11-30-1924, 82 y.o.   MRN: [id number]   06/13/2016 Aaron Frye   July 23, 1924  [id number]  Primary Physician Stephens Shire, MD Primary Cardiologist: Johnsie Cancel   Reason for Visit/CC: Routine follow-up for atrial fibrillation and hypertension  HPI:  The patient is a 82 year old male,  with a history of hypertension, chronic atrial fibrillation and prior h/o CVA. He is on chronic anticoagulation with Coumadin for stroke prophylaxis. His INRs are followed in our office. His most recent echocardiogram April 2016 revealed normal left ventricular systolic function. Ejection fraction was 60-65%. His last office visit with me  was 01/17/2015. He was felt to be stable from a cardiac standpoint.  He was instructed to follow-up in 3 months for repeat evaluation.  03/09/15 saw PA  concenred that he  noticed black stools. He is unsure if this is melena or due to Fe supplementation. Hct was 40.1 no guaics given to patient   His BP is well controlled. His afib is well controlled with a ventricular rate of 71 bpm. He is asymptomatic. He is fully compliant with Coumadin. INR is therapeutic at 2.5 . He is pretty stable on his feet. No recent falls. He also has a h/o venous ulcers which has been managed at the [facility] wound center.   Nose bleeds seen in ER 07/15/15  INR was Rx at 2.88  Needs INR today Legs doing better no edema   Current Outpatient Prescriptions  Medication Sig Dispense Refill  . atorvastatin (LIPITOR) 40 MG tablet TAKE ONE TABLET BY MOUTH DAILY 90 tablet 2  . ferrous sulfate 325 (65 FE) MG tablet Take 1 tablet (325 mg total) by mouth 2 (two) times daily with a meal. 60 tablet 11  . furosemide (LASIX) 20 MG tablet TAKE ONE TABLET BY MOUTH TWICE DAILY 60 tablet 11  . HYDROcodone-acetaminophen (NORCO/VICODIN) 5-325 MG per tablet Take 1-2 tablets by mouth every 6 (six) hours as needed for moderate pain. 90 tablet 0  . KLOR-CON M10 10 MEQ tablet TAKE ONE TABLET  BY MOUTH TWICE DAILY 60 tablet 11  . loratadine (CLARITIN) 10 MG tablet Take 1 tablet (10 mg total) by mouth daily. 30 tablet 1  . methocarbamol (ROBAXIN) 500 MG tablet Take 1 tablet (500 mg total) by mouth every 6 (six) hours as needed for muscle spasms (qid prn). 90 tablet 0  . metoprolol succinate (TOPROL-XL) 50 MG 24 hr tablet TAKE ONE TABLET BY MOUTH ONCE DAILY. TAKE WITH OR IMMEDIATELY FOLLOW A MEAL 90 tablet 2  . ramipril (ALTACE) 5 MG capsule Take 1 capsule (5 mg total) by mouth daily. 90 capsule 3  . saccharomyces boulardii (FLORASTOR) 250 MG capsule Take 1 capsule (250 mg total) by mouth 2 (two) times daily. 60 capsule 1  . Sulfamethoxazole-Trimethoprim (BACTRIM PO) Take 1 tablet by mouth 2 (two) times daily.     [name] warfarin (COUMADIN) 5 MG tablet TAKE BY MOUTH AS DIRECTED BY COUMADIN CLINIC 40 tablet 3   No current facility-administered medications for this visit.     Allergies  Allergen Reactions  . Penicillins Rash    Social History   Social History  . Marital status: Married    Spouse name: N/A  . Number of children: N/A  . 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 Smoking status: Never Smoker  . Smokeless tobacco: Never Used  . Alcohol use No  .  Drug use: No  . Sexual activity: Not on file   Other Topics Concern  . Not on file   Social History Narrative  . No narrative on file     Review of Systems: General: negative for chills, fever, night sweats or weight changes.  Cardiovascular: negative for chest pain, dyspnea on exertion, edema, orthopnea, palpitations, paroxysmal nocturnal dyspnea or shortness of breath Dermatological: negative for rash Respiratory: negative for cough or wheezing Urologic: negative for hematuria Abdominal: negative for nausea, vomiting, diarrhea, bright red blood per rectum, melena, or hematemesis Neurologic: negative for visual changes, syncope, or dizziness All other  systems reviewed and are otherwise negative except as noted above.    Blood pressure (!) 104/54, pulse 71, height 5\' 6"  (1.676 m), weight 156 lb 12.8 oz (71.1 kg).  General appearance: alert, cooperative and no distress Neck: no carotid bruit and no JVD Lungs: clear to auscultation bilaterally Heart: irregularly irregular rhythm and regular rate Extremities: no LEE; both legs wraped in ace-wrap Pulses: 2+ and symmetric Skin: warm and dry Neurologic: Grossly normal  EKG atrial fibrillation with a CVR 71 bpm  afib rate 71 inferior T wave changes   ASSESSMENT AND PLAN:   1. Chronic atrial fibrillation: rate is well controlled on metroprolol. INR is therapuetic on Coumadin at 2.9.   2. HTN: BP is well controlled on current regimen.  3. LEE Venous Ulcers: Followed at [facility] wound clinic. Both legs are wraped in dressings. Currently on antibiotics.   4. H/O CVA: no recent stroke-like symptoms. Fully compliant with Coumadin. INR is therapeutic.   5. Black Stools: Hct stable likely from iron Rx  6. Epistaxis resolved INR today f/u ENT  Jenkins Rouge

## 2016-06-13 ENCOUNTER — Encounter: Payer: Self-pay | Admitting: Cardiovascular Disease

## 2016-06-13 ENCOUNTER — Ambulatory Visit (INDEPENDENT_AMBULATORY_CARE_PROVIDER_SITE_OTHER): Payer: Medicare Other | Admitting: Cardiovascular Disease

## 2016-06-13 VITALS — BP 104/54 | HR 71 | Ht 66.0 in | Wt 156.8 lb

## 2016-06-13 DIAGNOSIS — I6350 Cerebral infarction due to unspecified occlusion or stenosis of unspecified cerebral artery: Secondary | ICD-10-CM

## 2016-06-13 DIAGNOSIS — I4891 Unspecified atrial fibrillation: Secondary | ICD-10-CM

## 2016-06-13 MED ORDER — FERROUS SULFATE 325 (65 FE) MG PO TABS: 325.0000 mg | ORAL_TABLET | Freq: Every day | ORAL | 6 refills | Status: DC

## 2016-06-13 NOTE — Patient Instructions (Signed)

## 2016-07-13 ENCOUNTER — Ambulatory Visit (INDEPENDENT_AMBULATORY_CARE_PROVIDER_SITE_OTHER): Payer: Medicare Other | Admitting: *Deleted

## 2016-07-13 DIAGNOSIS — G459 Transient cerebral ischemic attack, unspecified: Secondary | ICD-10-CM | POA: Diagnosis not present

## 2016-07-13 DIAGNOSIS — I4891 Unspecified atrial fibrillation: Secondary | ICD-10-CM | POA: Diagnosis not present

## 2016-07-13 DIAGNOSIS — Z7901 Long term (current) use of anticoagulants: Secondary | ICD-10-CM | POA: Diagnosis not present

## 2016-07-13 DIAGNOSIS — I6350 Cerebral infarction due to unspecified occlusion or stenosis of unspecified cerebral artery: Secondary | ICD-10-CM

## 2016-07-13 DIAGNOSIS — Z5181 Encounter for therapeutic drug level monitoring: Secondary | ICD-10-CM

## 2016-07-13 LAB — POCT INR: INR: 3

## 2016-08-06 ENCOUNTER — Other Ambulatory Visit: Payer: Self-pay | Admitting: Cardiovascular Disease

## 2016-08-08 ENCOUNTER — Encounter (INDEPENDENT_AMBULATORY_CARE_PROVIDER_SITE_OTHER): Payer: Medicare Other | Admitting: Ophthalmology

## 2016-08-08 DIAGNOSIS — I10 Essential (primary) hypertension: Secondary | ICD-10-CM | POA: Diagnosis not present

## 2016-08-08 DIAGNOSIS — H43813 Vitreous degeneration, bilateral: Secondary | ICD-10-CM

## 2016-08-08 DIAGNOSIS — H353124 Nonexudative age-related macular degeneration, left eye, advanced atrophic with subfoveal involvement: Secondary | ICD-10-CM

## 2016-08-08 DIAGNOSIS — H353211 Exudative age-related macular degeneration, right eye, with active choroidal neovascularization: Secondary | ICD-10-CM | POA: Diagnosis not present

## 2016-08-08 DIAGNOSIS — H35033 Hypertensive retinopathy, bilateral: Secondary | ICD-10-CM

## 2016-08-24 ENCOUNTER — Ambulatory Visit (INDEPENDENT_AMBULATORY_CARE_PROVIDER_SITE_OTHER): Payer: Medicare Other | Admitting: *Deleted

## 2016-08-24 DIAGNOSIS — Z5181 Encounter for therapeutic drug level monitoring: Secondary | ICD-10-CM

## 2016-08-24 DIAGNOSIS — Z7901 Long term (current) use of anticoagulants: Secondary | ICD-10-CM

## 2016-08-24 DIAGNOSIS — I6350 Cerebral infarction due to unspecified occlusion or stenosis of unspecified cerebral artery: Secondary | ICD-10-CM | POA: Diagnosis not present

## 2016-08-24 DIAGNOSIS — I4891 Unspecified atrial fibrillation: Secondary | ICD-10-CM

## 2016-08-24 DIAGNOSIS — G459 Transient cerebral ischemic attack, unspecified: Secondary | ICD-10-CM

## 2016-08-24 LAB — POCT INR: INR: 2.7

## 2016-10-05 ENCOUNTER — Ambulatory Visit (INDEPENDENT_AMBULATORY_CARE_PROVIDER_SITE_OTHER): Payer: Medicare Other | Admitting: *Deleted

## 2016-10-05 DIAGNOSIS — G459 Transient cerebral ischemic attack, unspecified: Secondary | ICD-10-CM

## 2016-10-05 DIAGNOSIS — Z5181 Encounter for therapeutic drug level monitoring: Secondary | ICD-10-CM | POA: Diagnosis not present

## 2016-10-05 DIAGNOSIS — Z7901 Long term (current) use of anticoagulants: Secondary | ICD-10-CM | POA: Diagnosis not present

## 2016-10-05 DIAGNOSIS — I4891 Unspecified atrial fibrillation: Secondary | ICD-10-CM | POA: Diagnosis not present

## 2016-10-05 DIAGNOSIS — I6350 Cerebral infarction due to unspecified occlusion or stenosis of unspecified cerebral artery: Secondary | ICD-10-CM | POA: Diagnosis not present

## 2016-10-05 LAB — POCT INR: INR: 2.4

## 2016-10-30 ENCOUNTER — Other Ambulatory Visit: Payer: Self-pay | Admitting: Cardiovascular Disease

## 2016-11-01 ENCOUNTER — Encounter (INDEPENDENT_AMBULATORY_CARE_PROVIDER_SITE_OTHER): Payer: Medicare Other | Admitting: Ophthalmology

## 2016-11-01 DIAGNOSIS — H43813 Vitreous degeneration, bilateral: Secondary | ICD-10-CM

## 2016-11-01 DIAGNOSIS — I10 Essential (primary) hypertension: Secondary | ICD-10-CM | POA: Diagnosis not present

## 2016-11-01 DIAGNOSIS — H353211 Exudative age-related macular degeneration, right eye, with active choroidal neovascularization: Secondary | ICD-10-CM

## 2016-11-01 DIAGNOSIS — H35373 Puckering of macula, bilateral: Secondary | ICD-10-CM

## 2016-11-01 DIAGNOSIS — H35033 Hypertensive retinopathy, bilateral: Secondary | ICD-10-CM | POA: Diagnosis not present

## 2016-11-01 DIAGNOSIS — H353124 Nonexudative age-related macular degeneration, left eye, advanced atrophic with subfoveal involvement: Secondary | ICD-10-CM | POA: Diagnosis not present

## 2016-11-16 ENCOUNTER — Encounter (INDEPENDENT_AMBULATORY_CARE_PROVIDER_SITE_OTHER): Payer: Self-pay

## 2016-11-16 ENCOUNTER — Ambulatory Visit (INDEPENDENT_AMBULATORY_CARE_PROVIDER_SITE_OTHER): Payer: Medicare Other | Admitting: *Deleted

## 2016-11-16 DIAGNOSIS — G459 Transient cerebral ischemic attack, unspecified: Secondary | ICD-10-CM | POA: Diagnosis not present

## 2016-11-16 DIAGNOSIS — I6350 Cerebral infarction due to unspecified occlusion or stenosis of unspecified cerebral artery: Secondary | ICD-10-CM | POA: Diagnosis not present

## 2016-11-16 DIAGNOSIS — I4891 Unspecified atrial fibrillation: Secondary | ICD-10-CM | POA: Diagnosis not present

## 2016-11-16 DIAGNOSIS — Z7901 Long term (current) use of anticoagulants: Secondary | ICD-10-CM | POA: Diagnosis not present

## 2016-11-16 DIAGNOSIS — Z5181 Encounter for therapeutic drug level monitoring: Secondary | ICD-10-CM | POA: Diagnosis not present

## 2016-11-16 LAB — POCT INR: INR: 3.7

## 2016-12-10 ENCOUNTER — Ambulatory Visit: Payer: Medicare Other | Admitting: Cardiovascular Disease

## 2016-12-18 ENCOUNTER — Other Ambulatory Visit: Payer: Self-pay | Admitting: Cardiovascular Disease

## 2016-12-24 NOTE — Progress Notes (Signed)
Patient ID: Aaron Frye, male   DOB: 04/17/1925, 83 y.o.   MRN: [id number]   12/25/2016 Aaron Frye   Sep 16, 1924  [id number]  Primary Physician Stephens Shire, MD Primary Cardiologist: Johnsie Cancel   Reason for Visit/CC: Routine follow-up for atrial fibrillation and hypertension  HPI:  The patient is a  83 y.o.  male,  with a history of hypertension, chronic atrial fibrillation and prior h/o CVA. He is on chronic anticoagulation with Coumadin for stroke prophylaxis. His INRs are followed in our office. His most recent echocardiogram April 2016 revealed normal left ventricular systolic function. Ejection fraction was 60-65%. His last office visit with me  was 01/17/2015. He was felt to be stable from a cardiac standpoint.  He was instructed to follow-up in 3 months for repeat evaluation.   His BP is well controlled. His afib is well controlled with a ventricular rate of 70 s bpm. He is asymptomatic. He is fully compliant with Coumadin. INR's have been Rx   He is pretty stable on his feet. No recent falls. He also has a h/o venous ulcers which has been managed at the [facility] wound center.   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 atorvastatin (LIPITOR) 40 MG tablet TAKE ONE TABLET BY MOUTH DAILY 90 tablet 2  . ferrous sulfate 325 (65 FE) MG tablet Take 1 tablet (325 mg total) by mouth daily. 30 tablet 6  . furosemide (LASIX) 20 MG tablet TAKE ONE TABLET BY MOUTH TWICE DAILY 60 tablet 11  . KLOR-CON M10 10 MEQ tablet TAKE ONE TABLET BY MOUTH TWICE DAILY 60 tablet 11  . loratadine (CLARITIN) 10 MG tablet Take 1 tablet (10 mg total) by mouth daily. 30 tablet 1  . methocarbamol (ROBAXIN) 500 MG tablet Take 1 tablet (500 mg total) by mouth every 6 (six) hours as needed for muscle spasms (qid prn). 90 tablet 0    . metoprolol succinate (TOPROL-XL) 50 MG 24 hr tablet TAKE ONE TABLET BY MOUTH ONCE DAILY WITH OR  IMMEDIATELY FOLLOWING A MEAL. 90 tablet 1  . ramipril (ALTACE) 5 MG capsule TAKE ONE CAPSULE BY MOUTH ONCE DAILY. 90 capsule 1  . saccharomyces boulardii (FLORASTOR) 250 MG capsule Take 1 capsule (250 mg total) by mouth 2 (two) times daily. 60 capsule 1  . Sulfamethoxazole-Trimethoprim (BACTRIM PO) Take 1 tablet by mouth 2 (two) times daily.     [name] warfarin (COUMADIN) 5 MG tablet TAKE BY MOUTH AS DIRECTED BY COUMADIN CLINIC 40 tablet 3   No current facility-administered medications for this visit.     Allergies  Allergen Reactions  . Penicillins Rash    Social History   Social History  . Marital status: Married    Spouse name: N/A  . Number of children: N/A  . 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 Smoking status: Never Smoker  . Smokeless tobacco: Never Used  . Alcohol use No  . Drug use: No  . Sexual activity: Not on file   Other Topics Concern  . Not on file   Social History Narrative  . No narrative on file     Review of Systems: General: negative for chills, fever, night sweats or weight  changes.  Cardiovascular: negative for chest pain, dyspnea on exertion, edema, orthopnea, palpitations, paroxysmal nocturnal dyspnea or shortness of breath Dermatological: negative for rash Respiratory: negative for cough or wheezing Urologic: negative for hematuria Abdominal: negative for nausea, vomiting, diarrhea, bright red blood per rectum, melena, or hematemesis Neurologic: negative for visual changes, syncope, or dizziness All other systems reviewed and are otherwise negative except as noted above.    Blood pressure 122/62, pulse 80, height 5\' 7"  (1.702 m), weight 68.9 kg (152 lb), SpO2 97 %.  Affect appropriate Kyphotic white male  HEENT: normal Neck supple with no adenopathy JVP normal no bruits no  thyromegaly Lungs clear with no wheezing and good diaphragmatic motion Heart:  S1/S2 no murmur, no rub, gallop or click PMI normal Abdomen: benighn, BS positve, no tenderness, no AAA no bruit.  No HSM or HJR Distal pulses intact with no bruits Plus one bilateral  Edema with chronic venous stasis to mid calf  Neuro non-focal Skin warm and dry No muscular weakness    EKG atrial fibrillation with a CVR 71 bpm  afib rate 71 inferior T wave changes   ASSESSMENT AND PLAN:   1. Chronic atrial fibrillation: rate is well controlled on metroprolol. INR is therapuetic on Coumadin at 2.9.   2. HTN: BP is well controlled on current regimen.  3. LEE Venous Ulcers: Followed at [facility] wound clinic. Both legs are wraped in dressings. Currently on antibiotics.   4. H/O CVA: no recent stroke-like symptoms. Fully compliant with Coumadin. INR is therapeutic.   5. Black Stools: Hct stable likely from iron Rx  6. Epistaxis resolved INR today f/u ENT  Jenkins Rouge

## 2016-12-25 ENCOUNTER — Encounter: Payer: Self-pay | Admitting: Cardiovascular Disease

## 2016-12-25 ENCOUNTER — Ambulatory Visit (INDEPENDENT_AMBULATORY_CARE_PROVIDER_SITE_OTHER): Payer: Medicare Other | Admitting: Pharmacist

## 2016-12-25 ENCOUNTER — Ambulatory Visit (INDEPENDENT_AMBULATORY_CARE_PROVIDER_SITE_OTHER): Payer: Medicare Other | Admitting: Cardiovascular Disease

## 2016-12-25 VITALS — BP 122/62 | HR 80 | Ht 67.0 in | Wt 152.0 lb

## 2016-12-25 DIAGNOSIS — Z7901 Long term (current) use of anticoagulants: Secondary | ICD-10-CM

## 2016-12-25 DIAGNOSIS — I482 Chronic atrial fibrillation, unspecified: Secondary | ICD-10-CM

## 2016-12-25 DIAGNOSIS — I10 Essential (primary) hypertension: Secondary | ICD-10-CM | POA: Diagnosis not present

## 2016-12-25 DIAGNOSIS — G459 Transient cerebral ischemic attack, unspecified: Secondary | ICD-10-CM | POA: Diagnosis not present

## 2016-12-25 DIAGNOSIS — I6350 Cerebral infarction due to unspecified occlusion or stenosis of unspecified cerebral artery: Secondary | ICD-10-CM

## 2016-12-25 DIAGNOSIS — I4891 Unspecified atrial fibrillation: Secondary | ICD-10-CM | POA: Diagnosis not present

## 2016-12-25 DIAGNOSIS — Z5181 Encounter for therapeutic drug level monitoring: Secondary | ICD-10-CM | POA: Diagnosis not present

## 2016-12-25 LAB — POCT INR: INR: 3.3

## 2016-12-25 NOTE — Patient Instructions (Signed)

## 2017-01-07 ENCOUNTER — Other Ambulatory Visit: Payer: Self-pay | Admitting: Cardiovascular Disease

## 2017-01-23 ENCOUNTER — Other Ambulatory Visit: Payer: Self-pay | Admitting: Cardiovascular Disease

## 2017-01-24 ENCOUNTER — Encounter (INDEPENDENT_AMBULATORY_CARE_PROVIDER_SITE_OTHER): Payer: Medicare Other | Admitting: Ophthalmology

## 2017-01-24 DIAGNOSIS — H43813 Vitreous degeneration, bilateral: Secondary | ICD-10-CM

## 2017-01-24 DIAGNOSIS — H35033 Hypertensive retinopathy, bilateral: Secondary | ICD-10-CM | POA: Diagnosis not present

## 2017-01-24 DIAGNOSIS — H35373 Puckering of macula, bilateral: Secondary | ICD-10-CM | POA: Diagnosis not present

## 2017-01-24 DIAGNOSIS — I10 Essential (primary) hypertension: Secondary | ICD-10-CM | POA: Diagnosis not present

## 2017-01-24 DIAGNOSIS — H353122 Nonexudative age-related macular degeneration, left eye, intermediate dry stage: Secondary | ICD-10-CM | POA: Diagnosis not present

## 2017-01-24 DIAGNOSIS — H353211 Exudative age-related macular degeneration, right eye, with active choroidal neovascularization: Secondary | ICD-10-CM | POA: Diagnosis not present

## 2017-02-05 ENCOUNTER — Ambulatory Visit (INDEPENDENT_AMBULATORY_CARE_PROVIDER_SITE_OTHER): Payer: Medicare Other | Admitting: *Deleted

## 2017-02-05 DIAGNOSIS — Z5181 Encounter for therapeutic drug level monitoring: Secondary | ICD-10-CM | POA: Diagnosis not present

## 2017-02-05 DIAGNOSIS — I6350 Cerebral infarction due to unspecified occlusion or stenosis of unspecified cerebral artery: Secondary | ICD-10-CM | POA: Diagnosis not present

## 2017-02-05 DIAGNOSIS — I4891 Unspecified atrial fibrillation: Secondary | ICD-10-CM

## 2017-02-05 DIAGNOSIS — Z7901 Long term (current) use of anticoagulants: Secondary | ICD-10-CM

## 2017-02-05 DIAGNOSIS — G459 Transient cerebral ischemic attack, unspecified: Secondary | ICD-10-CM

## 2017-02-05 LAB — POCT INR: INR: 3.7

## 2017-02-11 ENCOUNTER — Other Ambulatory Visit: Payer: Self-pay | Admitting: Cardiovascular Disease

## 2017-02-19 ENCOUNTER — Ambulatory Visit (INDEPENDENT_AMBULATORY_CARE_PROVIDER_SITE_OTHER): Payer: Medicare Other

## 2017-02-19 DIAGNOSIS — I6350 Cerebral infarction due to unspecified occlusion or stenosis of unspecified cerebral artery: Secondary | ICD-10-CM | POA: Diagnosis not present

## 2017-02-19 DIAGNOSIS — Z5181 Encounter for therapeutic drug level monitoring: Secondary | ICD-10-CM

## 2017-02-19 DIAGNOSIS — I4891 Unspecified atrial fibrillation: Secondary | ICD-10-CM

## 2017-02-19 DIAGNOSIS — G459 Transient cerebral ischemic attack, unspecified: Secondary | ICD-10-CM

## 2017-02-19 DIAGNOSIS — Z7901 Long term (current) use of anticoagulants: Secondary | ICD-10-CM | POA: Diagnosis not present

## 2017-02-19 LAB — POCT INR: INR: 3.1

## 2017-02-21 ENCOUNTER — Other Ambulatory Visit: Payer: Self-pay | Admitting: Cardiovascular Disease

## 2017-03-12 ENCOUNTER — Ambulatory Visit (INDEPENDENT_AMBULATORY_CARE_PROVIDER_SITE_OTHER): Payer: Medicare Other

## 2017-03-12 DIAGNOSIS — Z5181 Encounter for therapeutic drug level monitoring: Secondary | ICD-10-CM

## 2017-03-12 DIAGNOSIS — I4891 Unspecified atrial fibrillation: Secondary | ICD-10-CM

## 2017-03-12 DIAGNOSIS — G459 Transient cerebral ischemic attack, unspecified: Secondary | ICD-10-CM

## 2017-03-12 DIAGNOSIS — Z7901 Long term (current) use of anticoagulants: Secondary | ICD-10-CM

## 2017-03-12 DIAGNOSIS — I6350 Cerebral infarction due to unspecified occlusion or stenosis of unspecified cerebral artery: Secondary | ICD-10-CM

## 2017-03-12 LAB — POCT INR: INR: 1.8

## 2017-04-02 ENCOUNTER — Ambulatory Visit (INDEPENDENT_AMBULATORY_CARE_PROVIDER_SITE_OTHER): Payer: Medicare Other | Admitting: *Deleted

## 2017-04-02 ENCOUNTER — Encounter (INDEPENDENT_AMBULATORY_CARE_PROVIDER_SITE_OTHER): Payer: Self-pay

## 2017-04-02 DIAGNOSIS — Z5181 Encounter for therapeutic drug level monitoring: Secondary | ICD-10-CM

## 2017-04-02 DIAGNOSIS — G459 Transient cerebral ischemic attack, unspecified: Secondary | ICD-10-CM

## 2017-04-02 DIAGNOSIS — I4891 Unspecified atrial fibrillation: Secondary | ICD-10-CM | POA: Diagnosis not present

## 2017-04-02 DIAGNOSIS — I6350 Cerebral infarction due to unspecified occlusion or stenosis of unspecified cerebral artery: Secondary | ICD-10-CM | POA: Diagnosis not present

## 2017-04-02 DIAGNOSIS — Z7901 Long term (current) use of anticoagulants: Secondary | ICD-10-CM | POA: Diagnosis not present

## 2017-04-02 LAB — POCT INR: INR: 2.2

## 2017-04-18 ENCOUNTER — Encounter (INDEPENDENT_AMBULATORY_CARE_PROVIDER_SITE_OTHER): Payer: Medicare Other | Admitting: Ophthalmology

## 2017-04-18 DIAGNOSIS — H43813 Vitreous degeneration, bilateral: Secondary | ICD-10-CM

## 2017-04-18 DIAGNOSIS — H35033 Hypertensive retinopathy, bilateral: Secondary | ICD-10-CM

## 2017-04-18 DIAGNOSIS — H35373 Puckering of macula, bilateral: Secondary | ICD-10-CM | POA: Diagnosis not present

## 2017-04-18 DIAGNOSIS — H353211 Exudative age-related macular degeneration, right eye, with active choroidal neovascularization: Secondary | ICD-10-CM

## 2017-04-18 DIAGNOSIS — H353122 Nonexudative age-related macular degeneration, left eye, intermediate dry stage: Secondary | ICD-10-CM

## 2017-04-18 DIAGNOSIS — I10 Essential (primary) hypertension: Secondary | ICD-10-CM

## 2017-04-23 ENCOUNTER — Ambulatory Visit (INDEPENDENT_AMBULATORY_CARE_PROVIDER_SITE_OTHER): Payer: Medicare Other | Admitting: *Deleted

## 2017-04-23 DIAGNOSIS — I4891 Unspecified atrial fibrillation: Secondary | ICD-10-CM

## 2017-04-23 DIAGNOSIS — I6350 Cerebral infarction due to unspecified occlusion or stenosis of unspecified cerebral artery: Secondary | ICD-10-CM | POA: Diagnosis not present

## 2017-04-23 DIAGNOSIS — Z7901 Long term (current) use of anticoagulants: Secondary | ICD-10-CM

## 2017-04-23 DIAGNOSIS — Z5181 Encounter for therapeutic drug level monitoring: Secondary | ICD-10-CM

## 2017-04-23 DIAGNOSIS — G459 Transient cerebral ischemic attack, unspecified: Secondary | ICD-10-CM | POA: Diagnosis not present

## 2017-04-23 LAB — POCT INR: INR: 2.6

## 2017-05-01 ENCOUNTER — Other Ambulatory Visit: Payer: Self-pay | Admitting: Cardiovascular Disease

## 2017-05-21 ENCOUNTER — Ambulatory Visit (INDEPENDENT_AMBULATORY_CARE_PROVIDER_SITE_OTHER): Payer: Medicare Other | Admitting: *Deleted

## 2017-05-21 ENCOUNTER — Encounter (INDEPENDENT_AMBULATORY_CARE_PROVIDER_SITE_OTHER): Payer: Self-pay

## 2017-05-21 DIAGNOSIS — Z7901 Long term (current) use of anticoagulants: Secondary | ICD-10-CM | POA: Diagnosis not present

## 2017-05-21 DIAGNOSIS — Z5181 Encounter for therapeutic drug level monitoring: Secondary | ICD-10-CM

## 2017-05-21 DIAGNOSIS — G459 Transient cerebral ischemic attack, unspecified: Secondary | ICD-10-CM

## 2017-05-21 DIAGNOSIS — Z8673 Personal history of transient ischemic attack (TIA), and cerebral infarction without residual deficits: Secondary | ICD-10-CM | POA: Diagnosis not present

## 2017-05-21 DIAGNOSIS — I4891 Unspecified atrial fibrillation: Secondary | ICD-10-CM | POA: Diagnosis not present

## 2017-05-21 DIAGNOSIS — I6350 Cerebral infarction due to unspecified occlusion or stenosis of unspecified cerebral artery: Secondary | ICD-10-CM

## 2017-05-21 LAB — POCT INR: INR: 4.1

## 2017-05-21 NOTE — Patient Instructions (Signed)
Pt has taken coumadin today so instructed to hold coumadin tomorrow Nov 21st then continue taking 1 tablet daily except 1.5 tablets on Sundays. Recheck INR in 2 weeks.  Call us with any new medications or concerns [phone]. Instructed to eat extra greens today and tomorrow then keep intake of greens consistent

## 2017-06-03 ENCOUNTER — Other Ambulatory Visit: Payer: Self-pay | Admitting: Cardiovascular Disease

## 2017-06-04 ENCOUNTER — Ambulatory Visit (INDEPENDENT_AMBULATORY_CARE_PROVIDER_SITE_OTHER): Payer: Medicare Other | Admitting: *Deleted

## 2017-06-04 DIAGNOSIS — Z8673 Personal history of transient ischemic attack (TIA), and cerebral infarction without residual deficits: Secondary | ICD-10-CM | POA: Diagnosis not present

## 2017-06-04 DIAGNOSIS — G459 Transient cerebral ischemic attack, unspecified: Secondary | ICD-10-CM | POA: Diagnosis not present

## 2017-06-04 DIAGNOSIS — Z7901 Long term (current) use of anticoagulants: Secondary | ICD-10-CM | POA: Diagnosis not present

## 2017-06-04 DIAGNOSIS — I4891 Unspecified atrial fibrillation: Secondary | ICD-10-CM

## 2017-06-04 DIAGNOSIS — Z5181 Encounter for therapeutic drug level monitoring: Secondary | ICD-10-CM

## 2017-06-04 DIAGNOSIS — I6350 Cerebral infarction due to unspecified occlusion or stenosis of unspecified cerebral artery: Secondary | ICD-10-CM | POA: Diagnosis not present

## 2017-06-04 LAB — POCT INR: INR: 3

## 2017-06-04 NOTE — Patient Instructions (Signed)
Continue taking 1 tablet daily except 1.5 tablets on Sundays. Recheck INR in 3 weeks.  Call us with any new medications or concerns [phone]. Instructed to eat extra greens today and tomorrow then keep intake of greens consistent

## 2017-06-17 ENCOUNTER — Other Ambulatory Visit: Payer: Self-pay | Admitting: Cardiovascular Disease

## 2017-07-01 ENCOUNTER — Ambulatory Visit (INDEPENDENT_AMBULATORY_CARE_PROVIDER_SITE_OTHER): Payer: Medicare Other | Admitting: *Deleted

## 2017-07-01 DIAGNOSIS — I6350 Cerebral infarction due to unspecified occlusion or stenosis of unspecified cerebral artery: Secondary | ICD-10-CM

## 2017-07-01 DIAGNOSIS — Z7901 Long term (current) use of anticoagulants: Secondary | ICD-10-CM | POA: Diagnosis not present

## 2017-07-01 DIAGNOSIS — I4891 Unspecified atrial fibrillation: Secondary | ICD-10-CM | POA: Diagnosis not present

## 2017-07-01 DIAGNOSIS — Z5181 Encounter for therapeutic drug level monitoring: Secondary | ICD-10-CM | POA: Diagnosis not present

## 2017-07-01 DIAGNOSIS — G459 Transient cerebral ischemic attack, unspecified: Secondary | ICD-10-CM

## 2017-07-01 LAB — POCT INR: INR: 2.4

## 2017-07-01 NOTE — Patient Instructions (Addendum)
Description   Continue taking 1 tablet daily except 1.5 tablets on Sundays. Recheck INR in 4 weeks.  Call us with any new medications or concerns #[phone].      

## 2017-07-02 NOTE — Progress Notes (Signed)
Patient ID: Aaron Frye, male   DOB: 09-02-1924, 84 y.o.   MRN: [id number]   07/10/2017 Aaron Frye   April 12, 1925  [id number]  Primary Physician Stephens Shire, MD Primary Cardiologist: Johnsie Cancel   Reason for Visit/CC: Routine follow-up for atrial fibrillation and hypertension  HPI:  The patient is a  84 y.o.  male,  with a history of hypertension, chronic atrial fibrillation and prior h/o CVA. He is on chronic anticoagulation with Coumadin for stroke prophylaxis. His INRs are followed in our office. His last echocardiogram April 2016 revealed normal left ventricular systolic function. Ejection fraction was 60-65%.   His BP is well controlled. His afib is well controlled with a ventricular rates typically in 70's  He is asymptomatic. He is fully compliant with Coumadin. INR's have been Rx   He is pretty stable on his feet. No recent falls. He also has a h/o venous ulcers which has been managed at the [facility] wound center.   Nose bleeds seen in ER 07/15/15  INR was Rx at 2.88    Recent pneumonia Rx primary with antibiotics CXR cleared ? Some CHF Took extra lasix for 3 days improved.   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 atorvastatin (LIPITOR) 40 MG tablet TAKE ONE TABLET BY MOUTH ONCE DAILY 90 tablet 3  . ferrous sulfate 325 (65 FE) MG tablet Take 1 tablet (325 mg total) by mouth daily. 30 tablet 6  . furosemide (LASIX) 20 MG tablet TAKE ONE TABLET BY MOUTH TWICE DAILY 180 tablet 2  . KLOR-CON M10 10 MEQ tablet TAKE ONE TABLET BY MOUTH TWICE DAILY 60 tablet 6  . loratadine (CLARITIN) 10 MG tablet Take 1 tablet (10 mg total) by mouth daily. 30 tablet 1  . methocarbamol (ROBAXIN) 500 MG tablet Take 1 tablet (500 mg total) by mouth every 6 (six) hours as needed for muscle spasms (qid prn). 90 tablet 0  . metoprolol succinate (TOPROL-XL) 50 MG 24 hr tablet TAKE 1 TABLET BY MOUTH ONCE DAILY WITH OR  IMMEDIATELY FOLLOWING A MEAL. 90 tablet 1  . ramipril (ALTACE) 5 MG capsule TAKE 1 CAPSULE BY MOUTH ONCE DAILY 90 capsule 2  . saccharomyces boulardii (FLORASTOR) 250 MG capsule Take 1 capsule (250 mg total) by mouth 2 (two) times daily. 60 capsule 1  . Sulfamethoxazole-Trimethoprim (BACTRIM PO) Take 1 tablet by mouth 2 (two) times daily.     [name] warfarin (COUMADIN) 5 MG tablet TAKE AS DIRECTED BY MOUTH BY COUMADIN CLINIC 40 tablet 3   No current facility-administered medications for this visit.     Allergies  Allergen Reactions  . Penicillins Rash    Social History   Socioeconomic History  . Marital status: Married    Spouse name: Not on file  . Number of children: Not on file  . Years of education: Not on file  . Highest education level: Not on file  Social Needs  . Financial resource strain: Not on file  . Food insecurity - worry: Not on file  . Food insecurity - inability: Not on file  . Transportation needs - medical: Not on file  . Transportation needs - non-medical: Not on file  Occupational History  . 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 Smoking status: Never Smoker  . Smokeless tobacco: Never Used  Substance and Sexual Activity  . Alcohol use: No  . Drug use: No  . Sexual  activity: Not on file  Other Topics Concern  . Not on file  Social History Narrative  . Not on file     Review of Systems: General: negative for chills, fever, night sweats or weight changes.  Cardiovascular: negative for chest pain, dyspnea on exertion, edema, orthopnea, palpitations, paroxysmal nocturnal dyspnea or shortness of breath Dermatological: negative for rash Respiratory: negative for cough or wheezing Urologic: negative for hematuria Abdominal: negative for nausea, vomiting, diarrhea, bright red blood per rectum, melena, or hematemesis Neurologic: negative for visual changes, syncope, or dizziness All other systems reviewed and are otherwise negative except as noted  above.    Blood pressure 128/68, pulse 77, height 5\' 7"  (1.702 m), weight 152 lb 8 oz (69.2 kg), SpO2 97 %.  Affect appropriate Elderly kyphotic white male  HEENT: normal Neck supple with no adenopathy JVP normal no bruits no thyromegaly Lungs clear with no wheezing and good diaphragmatic motion Heart:  S1/S2 no murmur, no rub, gallop or click PMI normal Abdomen: benighn, BS positve, no tenderness, no AAA no bruit.  No HSM or HJR Distal pulses intact with no bruits Plus one bilateral edema with stasis  Neuro non-focal Skin warm and dry No muscular weakness     EKG atrial fibrillation with a CVR 71 bpm  afib rate 71 inferior T wave changes   ASSESSMENT AND PLAN:   1. Chronic atrial fibrillation: rate is well controlled on metroprolol. INR is therapuetic on Coumadin at 2.4.   2. HTN: BP is well controlled on current regimen.  3. LEE Venous Ulcers: improved f/u wound center [facility]   4. H/O CVA: no recent stroke-like symptoms. Fully compliant with Coumadin. INR is therapeutic.   5. Black Stools: Hct stable likely from iron Rx  6. Epistaxis resolved INR consistently RX and not high  f/u ENT  7. Dyspnea: recent pneumonia cleared by CXR f/u echo to make sure EF still normal   Jenkins Rouge

## 2017-07-03 ENCOUNTER — Ambulatory Visit
Admission: RE | Admit: 2017-07-03 | Discharge: 2017-07-03 | Disposition: A | Discharge status: Home / Self Care | Payer: Medicare Other | Source: Ambulatory Visit | Attending: Physician Assistant | Admitting: Physician Assistant

## 2017-07-03 ENCOUNTER — Telehealth: Payer: Self-pay | Admitting: Cardiovascular Disease

## 2017-07-03 ENCOUNTER — Other Ambulatory Visit: Payer: Self-pay | Admitting: Physician Assistant

## 2017-07-03 DIAGNOSIS — R0602 Shortness of breath: Secondary | ICD-10-CM

## 2017-07-03 NOTE — Telephone Encounter (Signed)
Pt c/o Shortness Of Breath: STAT if SOB developed within the last 24 hours or pt is noticeably SOB on the phone  1. Are you currently SOB (can you hear that pt is SOB on the phone)? Apolonio Schneiders calling from PCP office 2. How long have you been experiencing SOB? 3 or 4 days  3. Are you SOB when sitting or when up moving around?  Up walking  4. Are you currently experiencing any other symptoms? no  Apolonio Schneiders verbalized that they sent pt to [facility] imaging today and it shows changes in his chest X-RAY,It is listed under care everywhere.

## 2017-07-03 NOTE — Telephone Encounter (Addendum)
Patient saw Dewayne Shorter PA today at his PCP office for having SOB with activity. She ordered chest xray and Albuterol inhaler. Patient's PCP office is calling about patient's chest xray and would like Dr. Johnsie Cancel to look at Chest xray and advise. They were not sure on what they should do with patient's chest xray findings, and the PA wanted to refer to Dr. Johnsie Cancel to make changes if needed with medications. PCP office also wanted our office to call patient with those changes if needed. Consulted Dr. Caryl Comes, DOD, since Dr. Johnsie Cancel is out this afternoon. Dr. Caryl Comes called and spoke with Dewayne Shorter PA who saw patient today. She discussed increasing Lasix, Dr. Caryl Comes agreed with her plan and told her to follow up with patient.    Here is the chest xray results:  IMPRESSION: 1. Chronic small bilateral pleural effusions, mildly increased on the right since 2016. 2. Chronic pulmonary interstitial markings appear stable. These may be related to pulmonary vascular congestion, but there is no overt or increased pulmonary edema. 3.  Aortic Atherosclerosis (ICD10-I70.0).

## 2017-07-10 ENCOUNTER — Encounter: Payer: Self-pay | Admitting: Cardiovascular Disease

## 2017-07-10 ENCOUNTER — Ambulatory Visit (INDEPENDENT_AMBULATORY_CARE_PROVIDER_SITE_OTHER): Payer: Medicare Other | Admitting: Cardiovascular Disease

## 2017-07-10 VITALS — BP 128/68 | HR 77 | Ht 67.0 in | Wt 152.5 lb

## 2017-07-10 DIAGNOSIS — I10 Essential (primary) hypertension: Secondary | ICD-10-CM

## 2017-07-10 DIAGNOSIS — R0609 Other forms of dyspnea: Secondary | ICD-10-CM | POA: Diagnosis not present

## 2017-07-10 DIAGNOSIS — Z8673 Personal history of transient ischemic attack (TIA), and cerebral infarction without residual deficits: Secondary | ICD-10-CM | POA: Diagnosis not present

## 2017-07-10 DIAGNOSIS — I482 Chronic atrial fibrillation, unspecified: Secondary | ICD-10-CM

## 2017-07-10 NOTE — Patient Instructions (Addendum)

## 2017-07-11 ENCOUNTER — Encounter (INDEPENDENT_AMBULATORY_CARE_PROVIDER_SITE_OTHER): Payer: Medicare Other | Admitting: Ophthalmology

## 2017-07-11 DIAGNOSIS — I10 Essential (primary) hypertension: Secondary | ICD-10-CM

## 2017-07-11 DIAGNOSIS — H35373 Puckering of macula, bilateral: Secondary | ICD-10-CM | POA: Diagnosis not present

## 2017-07-11 DIAGNOSIS — H43813 Vitreous degeneration, bilateral: Secondary | ICD-10-CM | POA: Diagnosis not present

## 2017-07-11 DIAGNOSIS — H353211 Exudative age-related macular degeneration, right eye, with active choroidal neovascularization: Secondary | ICD-10-CM

## 2017-07-11 DIAGNOSIS — H353122 Nonexudative age-related macular degeneration, left eye, intermediate dry stage: Secondary | ICD-10-CM | POA: Diagnosis not present

## 2017-07-11 DIAGNOSIS — H35033 Hypertensive retinopathy, bilateral: Secondary | ICD-10-CM | POA: Diagnosis not present

## 2017-07-18 ENCOUNTER — Ambulatory Visit (HOSPITAL_COMMUNITY): Payer: Medicare Other | Attending: Cardiovascular Disease

## 2017-07-18 ENCOUNTER — Other Ambulatory Visit: Payer: Self-pay

## 2017-07-18 DIAGNOSIS — R0609 Other forms of dyspnea: Secondary | ICD-10-CM | POA: Diagnosis not present

## 2017-07-18 DIAGNOSIS — I083 Combined rheumatic disorders of mitral, aortic and tricuspid valves: Secondary | ICD-10-CM | POA: Insufficient documentation

## 2017-07-18 DIAGNOSIS — I420 Dilated cardiomyopathy: Secondary | ICD-10-CM | POA: Diagnosis not present

## 2017-07-23 ENCOUNTER — Ambulatory Visit
Admission: RE | Admit: 2017-07-23 | Discharge: 2017-07-23 | Disposition: A | Discharge status: Home / Self Care | Payer: Medicare Other | Source: Ambulatory Visit | Attending: Physician Assistant | Admitting: Physician Assistant

## 2017-07-23 ENCOUNTER — Telehealth: Payer: Self-pay | Admitting: Cardiovascular Disease

## 2017-07-23 ENCOUNTER — Other Ambulatory Visit: Payer: Self-pay | Admitting: Physician Assistant

## 2017-07-23 DIAGNOSIS — R062 Wheezing: Secondary | ICD-10-CM

## 2017-07-23 DIAGNOSIS — R059 Cough, unspecified: Secondary | ICD-10-CM

## 2017-07-23 DIAGNOSIS — R05 Cough: Secondary | ICD-10-CM

## 2017-07-23 DIAGNOSIS — R0602 Shortness of breath: Secondary | ICD-10-CM

## 2017-07-23 NOTE — Telephone Encounter (Signed)
Patient calling for echo results, did not quite understand results. Patient will be leaving in about 10 mins

## 2017-07-23 NOTE — Telephone Encounter (Signed)
Called patient back. Patient wanted his daughter Rica Mast to know what his echo showed. Patient has given verbal permission to speak with his daughter. Called patient's daughter and informed her of patient's echo. Per Dr. Johnsie Cancel, EF normal he has severe mitral stenosis no way to fix at his age would require open heart surgery already on coumadin and beta blocker. If he gets dyspnea can increase lasix f/u with me as before.   Patient's daughter stated that patient was seen by PCP and they did a chest xray. She stated that depending on chest xray results that they may increase patient's lasix. Patient's daughter is thinking that patient might need home health. Informed her that she needed to talk to patient's PCP about home health. Patient's daughter verbalized understanding.

## 2017-07-31 ENCOUNTER — Ambulatory Visit (INDEPENDENT_AMBULATORY_CARE_PROVIDER_SITE_OTHER): Payer: Medicare Other

## 2017-07-31 DIAGNOSIS — Z5181 Encounter for therapeutic drug level monitoring: Secondary | ICD-10-CM

## 2017-07-31 DIAGNOSIS — G459 Transient cerebral ischemic attack, unspecified: Secondary | ICD-10-CM

## 2017-07-31 DIAGNOSIS — I482 Chronic atrial fibrillation, unspecified: Secondary | ICD-10-CM

## 2017-07-31 LAB — POCT INR: INR: 2.4

## 2017-07-31 NOTE — Patient Instructions (Signed)
Description   Continue taking 1 tablet daily except 1.5 tablets on Sundays. Recheck INR in 6 weeks.  Call us with any new medications or concerns #[phone].      

## 2017-08-20 ENCOUNTER — Telehealth: Payer: Self-pay | Admitting: Cardiovascular Disease

## 2017-08-20 NOTE — Telephone Encounter (Signed)
Walk in pt Form-Ashley w/ [employer] dropped off folder about patients INR checks. Placed in [employer]

## 2017-08-26 NOTE — Telephone Encounter (Signed)
Patient enrollment form for home INR monitoring has been faxed to [facility].

## 2017-09-11 ENCOUNTER — Other Ambulatory Visit: Payer: Self-pay | Admitting: Cardiovascular Disease

## 2017-09-11 ENCOUNTER — Ambulatory Visit (INDEPENDENT_AMBULATORY_CARE_PROVIDER_SITE_OTHER): Payer: Medicare Other | Admitting: Cardiovascular Disease

## 2017-09-11 DIAGNOSIS — I482 Chronic atrial fibrillation, unspecified: Secondary | ICD-10-CM

## 2017-09-11 DIAGNOSIS — Z5181 Encounter for therapeutic drug level monitoring: Secondary | ICD-10-CM | POA: Diagnosis not present

## 2017-09-11 DIAGNOSIS — G459 Transient cerebral ischemic attack, unspecified: Secondary | ICD-10-CM

## 2017-09-11 LAB — POCT INR: INR: 2.4

## 2017-09-11 NOTE — Patient Instructions (Signed)
Description   Spoke with April RN with [facility] & instructed pt to continue taking 1 tablet daily except 1.5 tablets on Sundays. Recheck INR in 6 weeks.  Call us with any new medications or concerns [phone].

## 2017-10-03 ENCOUNTER — Encounter (INDEPENDENT_AMBULATORY_CARE_PROVIDER_SITE_OTHER): Payer: Medicare Other | Admitting: Ophthalmology

## 2017-10-03 DIAGNOSIS — H353211 Exudative age-related macular degeneration, right eye, with active choroidal neovascularization: Secondary | ICD-10-CM | POA: Diagnosis not present

## 2017-10-03 DIAGNOSIS — H43813 Vitreous degeneration, bilateral: Secondary | ICD-10-CM

## 2017-10-03 DIAGNOSIS — H35033 Hypertensive retinopathy, bilateral: Secondary | ICD-10-CM

## 2017-10-03 DIAGNOSIS — H353122 Nonexudative age-related macular degeneration, left eye, intermediate dry stage: Secondary | ICD-10-CM | POA: Diagnosis not present

## 2017-10-03 DIAGNOSIS — H35373 Puckering of macula, bilateral: Secondary | ICD-10-CM

## 2017-10-03 DIAGNOSIS — I10 Essential (primary) hypertension: Secondary | ICD-10-CM

## 2017-10-19 ENCOUNTER — Other Ambulatory Visit: Payer: Self-pay

## 2017-10-19 ENCOUNTER — Encounter (HOSPITAL_COMMUNITY): Payer: Self-pay | Admitting: *Deleted

## 2017-10-19 ENCOUNTER — Inpatient Hospital Stay (HOSPITAL_COMMUNITY)
Admission: EM | Admit: 2017-10-19 | Discharge: 2017-10-24 | DRG: 291 | Disposition: A | Discharge status: Home Health | Payer: Medicare Other | Attending: Internal Medicine | Admitting: Internal Medicine

## 2017-10-19 ENCOUNTER — Emergency Department (HOSPITAL_COMMUNITY): Payer: Medicare Other

## 2017-10-19 DIAGNOSIS — N390 Urinary tract infection, site not specified: Secondary | ICD-10-CM | POA: Diagnosis present

## 2017-10-19 DIAGNOSIS — Z818 Family history of other mental and behavioral disorders: Secondary | ICD-10-CM

## 2017-10-19 DIAGNOSIS — I050 Rheumatic mitral stenosis: Secondary | ICD-10-CM | POA: Diagnosis not present

## 2017-10-19 DIAGNOSIS — Z7901 Long term (current) use of anticoagulants: Secondary | ICD-10-CM

## 2017-10-19 DIAGNOSIS — Z8701 Personal history of pneumonia (recurrent): Secondary | ICD-10-CM

## 2017-10-19 DIAGNOSIS — J9621 Acute and chronic respiratory failure with hypoxia: Secondary | ICD-10-CM | POA: Diagnosis present

## 2017-10-19 DIAGNOSIS — Z8673 Personal history of transient ischemic attack (TIA), and cerebral infarction without residual deficits: Secondary | ICD-10-CM

## 2017-10-19 DIAGNOSIS — I878 Other specified disorders of veins: Secondary | ICD-10-CM | POA: Diagnosis present

## 2017-10-19 DIAGNOSIS — Z79899 Other long term (current) drug therapy: Secondary | ICD-10-CM | POA: Diagnosis not present

## 2017-10-19 DIAGNOSIS — R0902 Hypoxemia: Secondary | ICD-10-CM

## 2017-10-19 DIAGNOSIS — I4891 Unspecified atrial fibrillation: Secondary | ICD-10-CM | POA: Diagnosis not present

## 2017-10-19 DIAGNOSIS — E785 Hyperlipidemia, unspecified: Secondary | ICD-10-CM | POA: Diagnosis present

## 2017-10-19 DIAGNOSIS — J9601 Acute respiratory failure with hypoxia: Secondary | ICD-10-CM | POA: Diagnosis present

## 2017-10-19 DIAGNOSIS — I482 Chronic atrial fibrillation, unspecified: Secondary | ICD-10-CM | POA: Diagnosis present

## 2017-10-19 DIAGNOSIS — I083 Combined rheumatic disorders of mitral, aortic and tricuspid valves: Secondary | ICD-10-CM | POA: Diagnosis present

## 2017-10-19 DIAGNOSIS — R739 Hyperglycemia, unspecified: Secondary | ICD-10-CM | POA: Diagnosis present

## 2017-10-19 DIAGNOSIS — I110 Hypertensive heart disease with heart failure: Secondary | ICD-10-CM | POA: Diagnosis present

## 2017-10-19 DIAGNOSIS — Z9981 Dependence on supplemental oxygen: Secondary | ICD-10-CM | POA: Diagnosis not present

## 2017-10-19 DIAGNOSIS — Z8546 Personal history of malignant neoplasm of prostate: Secondary | ICD-10-CM | POA: Diagnosis not present

## 2017-10-19 DIAGNOSIS — I959 Hypotension, unspecified: Secondary | ICD-10-CM | POA: Diagnosis not present

## 2017-10-19 DIAGNOSIS — I10 Essential (primary) hypertension: Secondary | ICD-10-CM | POA: Diagnosis not present

## 2017-10-19 DIAGNOSIS — E782 Mixed hyperlipidemia: Secondary | ICD-10-CM | POA: Diagnosis not present

## 2017-10-19 DIAGNOSIS — R609 Edema, unspecified: Secondary | ICD-10-CM | POA: Diagnosis present

## 2017-10-19 DIAGNOSIS — J440 Chronic obstructive pulmonary disease with acute lower respiratory infection: Secondary | ICD-10-CM | POA: Diagnosis present

## 2017-10-19 DIAGNOSIS — J9620 Acute and chronic respiratory failure, unspecified whether with hypoxia or hypercapnia: Secondary | ICD-10-CM | POA: Diagnosis present

## 2017-10-19 DIAGNOSIS — I5033 Acute on chronic diastolic (congestive) heart failure: Secondary | ICD-10-CM | POA: Diagnosis present

## 2017-10-19 DIAGNOSIS — I509 Heart failure, unspecified: Secondary | ICD-10-CM

## 2017-10-19 DIAGNOSIS — J9611 Chronic respiratory failure with hypoxia: Secondary | ICD-10-CM

## 2017-10-19 DIAGNOSIS — J181 Lobar pneumonia, unspecified organism: Secondary | ICD-10-CM | POA: Diagnosis present

## 2017-10-19 DIAGNOSIS — R0602 Shortness of breath: Secondary | ICD-10-CM | POA: Diagnosis not present

## 2017-10-19 DIAGNOSIS — Z880 Allergy status to penicillin: Secondary | ICD-10-CM

## 2017-10-19 DIAGNOSIS — J449 Chronic obstructive pulmonary disease, unspecified: Secondary | ICD-10-CM

## 2017-10-19 DIAGNOSIS — R748 Abnormal levels of other serum enzymes: Secondary | ICD-10-CM | POA: Diagnosis not present

## 2017-10-19 DIAGNOSIS — D72829 Elevated white blood cell count, unspecified: Secondary | ICD-10-CM | POA: Diagnosis not present

## 2017-10-19 DIAGNOSIS — R7989 Other specified abnormal findings of blood chemistry: Secondary | ICD-10-CM | POA: Diagnosis present

## 2017-10-19 DIAGNOSIS — R778 Other specified abnormalities of plasma proteins: Secondary | ICD-10-CM | POA: Diagnosis present

## 2017-10-19 DIAGNOSIS — I5021 Acute systolic (congestive) heart failure: Secondary | ICD-10-CM | POA: Diagnosis not present

## 2017-10-19 LAB — HEMOGLOBIN A1C
HEMOGLOBIN A1C: 6.2 % — AB (ref 4.8–5.6)
MEAN PLASMA GLUCOSE: 131.24 mg/dL

## 2017-10-19 LAB — BASIC METABOLIC PANEL
Anion gap: 13 (ref 5–15)
BUN: 22 mg/dL — AB (ref 6–20)
CO2: 23 mmol/L (ref 22–32)
CREATININE: 1.02 mg/dL (ref 0.61–1.24)
Calcium: 8.8 mg/dL — ABNORMAL LOW (ref 8.9–10.3)
Chloride: 99 mmol/L — ABNORMAL LOW (ref 101–111)
GFR calc Af Amer: >60 mL/min (ref >60)
GLUCOSE: 281 mg/dL — AB (ref 65–99)
Potassium: 4.5 mmol/L (ref 3.5–5.1)
SODIUM: 135 mmol/L (ref 135–145)

## 2017-10-19 LAB — CBC
HCT: 38.5 % — ABNORMAL LOW (ref 39.0–52.0)
Hemoglobin: 11.7 g/dL — ABNORMAL LOW (ref 13.0–17.0)
MCH: 24 pg — ABNORMAL LOW (ref 26.0–34.0)
MCHC: 30.4 g/dL (ref 30.0–36.0)
MCV: 78.9 fL (ref 78.0–100.0)
PLATELETS: 227 10*3/uL (ref 150–400)
RBC: 4.88 MIL/uL (ref 4.22–5.81)
RDW: 17.8 % — AB (ref 11.5–15.5)
WBC: 20.4 10*3/uL — AB (ref 4.0–10.5)

## 2017-10-19 LAB — PROTIME-INR
INR: 2.45
Prothrombin Time: 26.4 seconds — ABNORMAL HIGH (ref 11.4–15.2)

## 2017-10-19 LAB — I-STAT TROPONIN, ED: Troponin i, poc: 0 ng/mL (ref 0.00–0.08)

## 2017-10-19 LAB — BRAIN NATRIURETIC PEPTIDE: B Natriuretic Peptide: 270.4 pg/mL — ABNORMAL HIGH (ref 0.0–100.0)

## 2017-10-19 LAB — GLUCOSE, CAPILLARY
GLUCOSE-CAPILLARY: 93 mg/dL (ref 65–99)
Glucose-Capillary: 172 mg/dL — ABNORMAL HIGH (ref 65–99)
Glucose-Capillary: 114 mg/dL — ABNORMAL HIGH (ref 65–99)

## 2017-10-19 LAB — TROPONIN I
TROPONIN I: 1.01 ng/mL — AB (ref <0.03)
Troponin I: 1.01 ng/mL (ref <0.03)
Troponin I: 0.69 ng/mL (ref <0.03)

## 2017-10-19 MED ORDER — POTASSIUM CHLORIDE CRYS ER 10 MEQ PO TBCR
10.0000 meq | EXTENDED_RELEASE_TABLET | Freq: Two times a day (BID) | ORAL | Status: DC
  Administered 2017-10-19 – 2017-10-24 (×11): 10 meq via ORAL
  Filled 2017-10-19 (×11): qty 1

## 2017-10-19 MED ORDER — IPRATROPIUM BROMIDE 0.02 % IN SOLN
0.5000 mg | RESPIRATORY_TRACT | Status: DC
  Filled 2017-10-19: qty 2.5

## 2017-10-19 MED ORDER — WARFARIN SODIUM 5 MG PO TABS: 5.0000 mg | ORAL_TABLET | Freq: Every day | ORAL | Status: DC

## 2017-10-19 MED ORDER — FUROSEMIDE 10 MG/ML IJ SOLN: 60.0000 mg | Freq: Once | INTRAMUSCULAR | Status: DC

## 2017-10-19 MED ORDER — ATORVASTATIN CALCIUM 40 MG PO TABS
40.0000 mg | ORAL_TABLET | Freq: Every day | ORAL | Status: DC
  Administered 2017-10-19 – 2017-10-23 (×5): 40 mg via ORAL
  Filled 2017-10-19 (×5): qty 1

## 2017-10-19 MED ORDER — LEVALBUTEROL HCL 0.63 MG/3ML IN NEBU: 0.6300 mg | INHALATION_SOLUTION | Freq: Four times a day (QID) | RESPIRATORY_TRACT | Status: DC | PRN

## 2017-10-19 MED ORDER — LEVALBUTEROL HCL 0.63 MG/3ML IN NEBU: 0.6300 mg | INHALATION_SOLUTION | Freq: Four times a day (QID) | RESPIRATORY_TRACT | Status: DC

## 2017-10-19 MED ORDER — ACETAMINOPHEN 325 MG PO TABS: 650.0000 mg | ORAL_TABLET | Freq: Four times a day (QID) | ORAL | Status: DC | PRN

## 2017-10-19 MED ORDER — WARFARIN - PHARMACIST DOSING INPATIENT
Freq: Every day | Status: DC
  Administered 2017-10-19 – 2017-10-21 (×2)
  Administered 2017-10-22: 1
  Administered 2017-10-23: 18:00:00

## 2017-10-19 MED ORDER — BISACODYL 10 MG RE SUPP: 10.0000 mg | Freq: Every day | RECTAL | Status: DC | PRN

## 2017-10-19 MED ORDER — INSULIN ASPART 100 UNIT/ML ~~LOC~~ SOLN
0.0000 [IU] | Freq: Three times a day (TID) | SUBCUTANEOUS | Status: DC
  Administered 2017-10-19 – 2017-10-20 (×2): 2 [IU] via SUBCUTANEOUS
  Administered 2017-10-21: 3 [IU] via SUBCUTANEOUS
  Administered 2017-10-22: 1 [IU] via SUBCUTANEOUS
  Administered 2017-10-22: 2 [IU] via SUBCUTANEOUS
  Administered 2017-10-23: 1 [IU] via SUBCUTANEOUS
  Administered 2017-10-24: 2 [IU] via SUBCUTANEOUS

## 2017-10-19 MED ORDER — IPRATROPIUM BROMIDE 0.02 % IN SOLN
0.5000 mg | Freq: Four times a day (QID) | RESPIRATORY_TRACT | Status: DC
  Administered 2017-10-19 (×3): 0.5 mg via RESPIRATORY_TRACT
  Filled 2017-10-19 (×2): qty 2.5

## 2017-10-19 MED ORDER — LEVALBUTEROL HCL 1.25 MG/0.5ML IN NEBU
1.2500 mg | INHALATION_SOLUTION | Freq: Four times a day (QID) | RESPIRATORY_TRACT | Status: DC
  Administered 2017-10-19 (×3): 1.25 mg via RESPIRATORY_TRACT
  Filled 2017-10-19 (×4): qty 0.5

## 2017-10-19 MED ORDER — LEVALBUTEROL HCL 1.25 MG/0.5ML IN NEBU
1.2500 mg | INHALATION_SOLUTION | Freq: Three times a day (TID) | RESPIRATORY_TRACT | Status: DC
  Administered 2017-10-20 – 2017-10-22 (×7): 1.25 mg via RESPIRATORY_TRACT
  Filled 2017-10-19 (×7): qty 0.5

## 2017-10-19 MED ORDER — LORATADINE 10 MG PO TABS
10.0000 mg | ORAL_TABLET | Freq: Every day | ORAL | Status: DC
  Administered 2017-10-19 – 2017-10-24 (×6): 10 mg via ORAL
  Filled 2017-10-19 (×6): qty 1

## 2017-10-19 MED ORDER — DILTIAZEM HCL-DEXTROSE 100-5 MG/100ML-% IV SOLN (PREMIX)
5.0000 mg/h | INTRAVENOUS | Status: DC
  Administered 2017-10-19: 5 mg/h via INTRAVENOUS
  Filled 2017-10-19: qty 100

## 2017-10-19 MED ORDER — ACETAMINOPHEN 650 MG RE SUPP: 650.0000 mg | Freq: Four times a day (QID) | RECTAL | Status: DC | PRN

## 2017-10-19 MED ORDER — FUROSEMIDE 10 MG/ML IJ SOLN
80.0000 mg | Freq: Once | INTRAMUSCULAR | Status: AC
  Administered 2017-10-19: 80 mg via INTRAVENOUS
  Filled 2017-10-19: qty 8

## 2017-10-19 MED ORDER — WARFARIN SODIUM 5 MG PO TABS
5.0000 mg | ORAL_TABLET | Freq: Once | ORAL | Status: AC
  Administered 2017-10-19: 5 mg via ORAL
  Filled 2017-10-19: qty 1

## 2017-10-19 MED ORDER — FUROSEMIDE 10 MG/ML IJ SOLN
40.0000 mg | Freq: Two times a day (BID) | INTRAMUSCULAR | Status: DC
  Administered 2017-10-19 (×2): 40 mg via INTRAVENOUS
  Filled 2017-10-19 (×3): qty 4

## 2017-10-19 MED ORDER — METOPROLOL SUCCINATE ER 50 MG PO TB24
50.0000 mg | ORAL_TABLET | Freq: Every day | ORAL | Status: DC
  Administered 2017-10-19 – 2017-10-20 (×2): 50 mg via ORAL
  Filled 2017-10-19 (×2): qty 1

## 2017-10-19 MED ORDER — IPRATROPIUM BROMIDE 0.02 % IN SOLN
0.5000 mg | Freq: Three times a day (TID) | RESPIRATORY_TRACT | Status: DC
  Administered 2017-10-20 – 2017-10-22 (×7): 0.5 mg via RESPIRATORY_TRACT
  Filled 2017-10-19 (×7): qty 2.5

## 2017-10-19 MED ORDER — POLYETHYLENE GLYCOL 3350 17 G PO PACK: 17.0000 g | PACK | Freq: Every day | ORAL | Status: DC | PRN

## 2017-10-19 MED ORDER — DILTIAZEM LOAD VIA INFUSION
15.0000 mg | Freq: Once | INTRAVENOUS | Status: AC
  Administered 2017-10-19: 15 mg via INTRAVENOUS
  Filled 2017-10-19: qty 15

## 2017-10-19 NOTE — ED Notes (Signed)
Attempted report unsucessfully rn is to call back

## 2017-10-19 NOTE — ED Notes (Signed)
Pt placed on bipap by resp therapy.

## 2017-10-19 NOTE — ED Notes (Signed)
Report called to rn on 4e 

## 2017-10-19 NOTE — ED Notes (Signed)
Condom catheter has placed by [facility]ler nurse tech

## 2017-10-19 NOTE — ED Notes (Signed)
The pts bi-pap keeps alarming  resp therapy has been called x 2

## 2017-10-19 NOTE — Care Management (Signed)
This is a no charge note  Pending admission per Dr. Venora Maples   84 year old male with past medical history COPD on home oxygen, hypertension, hyperlipidemia, stroke, TIA, atrial fibrillation on Coumadin, dCHF, who presents SOB, oxygen desaturated to 82% on room air. Has leukocytosis, but no fever or chills. Has x-ray showed pulmonary edema, questionable superimposed pneumonia. BNP 270.4, INR 2.45. Patient was started on BiPAP. Clinically most consistent with CHF exacerbation. 80 mg Lasix was ordered by EDP. Also has A fib with RVR, treated with IV cardizem gtt. Pt is admitted to stepdown as inpatient.  Ivor Costa, MD  [facility] Pager [phone]  If 7PM-7AM, please contact night-coverage [email or web address] Password [email or web address] 10/19/2017, 4:51 AM

## 2017-10-19 NOTE — Progress Notes (Signed)
CRITICAL VALUE ALERT  Critical Value:  Troponin 1.01  Date & Time Notied:  10/19/17 1100  Provider Notified: Yes, Paged Jamse Arn, MD  Orders Received/Actions taken: Provider made aware.

## 2017-10-19 NOTE — ED Provider Notes (Signed)
[facility] EMERGENCY DEPARTMENT Provider Note   CSN: [id number] Arrival date & time: 10/19/17  0257     History   Chief Complaint Chief Complaint  Patient presents with  . Respiratory Distress   Level 5 caveat: Respiratory distress  HPI Aaron Frye is a 84 y.o. male.  HPI 6-year-old male presents the emergency department feeling generally weak today but then developing sudden shortness of breath this evening.  He has a history of chronic A. fib on Coumadin.  He has oxygen dependent COPD.  His wife does not know how many liters she is on.  He contacted EMS for shortness of breath and when EMS found the patient the patient had hypoxia down to 78-80%.  He was in A. fib with RVR.  He is brought to the emergency department on BiPAP.  He had been given magnesium and bronchodilators by EMS.  He had mild improvement on arrival to emergency department.  After removal of the BiPAP he continues to feel shortness of breath with increasing work of breathing and was placed back on BiPAP.  No reported fevers.  Wife reports no cough or congestion over the past 48 hours.    Past Medical History:  Diagnosis Date  . Atrial fibrillation ([place])    Persisent   . CVA (cerebral vascular accident) ([place])   . Femur fracture, right [facility]) March 2016  . HLD (hyperlipidemia)   . HTN (hypertension)   . Non-ischemic cardiomyopathy ([place])    LVEF=35-40% by echo 2009  . Prostate cancer ([place])   . TIA (transient ischemic attack)     Patient Active Problem List   Diagnosis Date Noted  . Acute on chronic respiratory failure with hypoxia ([place]) 10/19/2017  . Diastolic CHF, acute on chronic (HCC)   . Mitral stenosis   . Leukocytosis 09/24/2014  . Intertrochanteric fracture of right femur ([place]) 09/24/2014  . Hip fracture ([place]) 09/23/2014  . History of stroke 09/23/2014  . HLD (hyperlipidemia) 09/23/2014  . Fall   . Encounter for therapeutic drug monitoring 09/01/2013  . Long term  current use of anticoagulant therapy 11/14/2010  . Congestive heart failure ([place]) 04/18/2010  . EDEMA 10/12/2008  . Elevated lipids 10/08/2008  . Essential hypertension 10/08/2008  . Atrial fibrillation ([place]) 10/08/2008  . CVA 10/08/2008  . Transient cerebral ischemia 10/08/2008    Past Surgical History:  Procedure Laterality Date  . APPENDECTOMY    . INTRAMEDULLARY (IM) NAIL INTERTROCHANTERIC Right 09/24/2014   Procedure: INTRAMEDULLARY (IM) NAIL INTERTROCHANTRIC;  Surgeon: Rod Can, MD;  Location: [place];  Service: Orthopedics;  Laterality: Right;  . TONSILLECTOMY          Home Medications    Prior to Admission medications   Medication Sig Start Date End Date Taking? Authorizing Provider  atorvastatin (LIPITOR) 40 MG tablet TAKE ONE TABLET BY MOUTH ONCE DAILY 01/23/17   Josue Hector, MD  ferrous sulfate 325 (65 FE) MG tablet Take 1 tablet (325 mg total) by mouth daily. 06/13/16   Josue Hector, MD  furosemide (LASIX) 20 MG tablet TAKE ONE TABLET BY MOUTH TWICE DAILY 02/21/17   Josue Hector, MD  KLOR-CON M10 10 MEQ tablet TAKE 1 TABLET BY MOUTH TWICE DAILY 09/11/17   Josue Hector, MD  loratadine (CLARITIN) 10 MG tablet Take 1 tablet (10 mg total) by mouth daily. 10/08/14   Angiulli, Lavon Paganini, PA-C  methocarbamol (ROBAXIN) 500 MG tablet Take 1 tablet (500 mg total) by mouth every 6 (  six) hours as needed for muscle spasms (qid prn). 10/08/14   Angiulli, Lavon Paganini, PA-C  metoprolol succinate (TOPROL-XL) 50 MG 24 hr tablet TAKE 1 TABLET BY MOUTH ONCE DAILY WITH OR IMMEDIATELY FOLLOWING A MEAL. 06/17/17   Josue Hector, MD  ramipril (ALTACE) 5 MG capsule TAKE 1 CAPSULE BY MOUTH ONCE DAILY 05/02/17   Josue Hector, MD  saccharomyces boulardii (FLORASTOR) 250 MG capsule Take 1 capsule (250 mg total) by mouth 2 (two) times daily. 10/08/14   Angiulli, Lavon Paganini, PA-C  Sulfamethoxazole-Trimethoprim (BACTRIM PO) Take 1 tablet by mouth 2 (two) times daily.     [provider]    warfarin (COUMADIN) 5 MG tablet TAKE AS DIRECTED BY MOUTH BY COUMADIN CLINIC 06/03/17   Josue Hector, MD    Family History Family History  Problem Relation Age of Onset  . Lung disease Father   . Heart failure Mother   . Cancer Brother        Not sure which type of cancer  . Heart attack Brother   . Hypertension Son   . Stroke Neg Hx     Social History Social History   Tobacco Use  . Smoking status: Never Smoker  . Smokeless tobacco: Never Used  Substance Use Topics  . Alcohol use: No  . Drug use: No     Allergies   Penicillins   Review of Systems Review of Systems  Unable to perform ROS: Severe respiratory distress     Physical Exam Updated Vital Signs BP 105/64   Pulse 78   Temp (!) 97 F (36.1 C)   Resp (!) 24   Wt 71.7 kg (158 lb)   SpO2 99%   BMI 24.75 kg/m   Physical Exam  Constitutional: He is oriented to person, place, and time. He appears well-developed and well-nourished.  HENT:  Head: Normocephalic and atraumatic.  Eyes: EOM are normal.  Neck: Normal range of motion. Neck supple.  Cardiovascular: Intact distal pulses.  Tachycardic.  Irregularly irregular.  Pulmonary/Chest:  Tachypnea.  Accessory muscle use.  Abdominal breathing.  Decreased breath sounds throughout all lung fields.  No wheezing.  No stridor.  Abdominal: Soft. He exhibits no distension. There is no tenderness.  Musculoskeletal: Normal range of motion. He exhibits edema.  Neurological: He is alert and oriented to person, place, and time.  Skin: Skin is warm and dry.  Psychiatric: He has a normal mood and affect.  Nursing note and vitals reviewed.    ED Treatments / Results  Labs (all labs ordered are listed, but only abnormal results are displayed) Labs Reviewed  BASIC METABOLIC PANEL - Abnormal; Notable for the following components:      Result Value   Chloride 99 (*)    Glucose, Bld 281 (*)    BUN 22 (*)    Calcium 8.8 (*)    All other components within  normal limits  CBC - Abnormal; Notable for the following components:   WBC 20.4 (*)    Hemoglobin 11.7 (*)    HCT 38.5 (*)    MCH 24.0 (*)    RDW 17.8 (*)    All other components within normal limits  PROTIME-INR - Abnormal; Notable for the following components:   Prothrombin Time 26.4 (*)    All other components within normal limits  BRAIN NATRIURETIC PEPTIDE - Abnormal; Notable for the following components:   B Natriuretic Peptide 270.4 (*)    All other components within normal limits  I-STAT  TROPONIN, ED    EKG EKG Interpretation  Date/Time:  Saturday October 19 2017 03:04:55 EDT Ventricular Rate:  120 PR Interval:    QRS Duration: 104 QT Interval:  325 QTC Calculation: 460 R Axis:   52 Text Interpretation:  Atrial fibrillation Repolarization abnormality, prob rate related No significant change was found Confirmed by Jola Schmidt [phone]) on 10/19/2017 3:11:53 AM   Radiology Dg Chest Portable 1 View  Result Date: 10/19/2017 CLINICAL DATA:  Respiratory distress EXAM: PORTABLE CHEST 1 VIEW COMPARISON:  07/23/2017 FINDINGS: Cardiomegaly with aortic atherosclerosis. No aneurysm. Moderate right effusion with pulmonary edema. Patchy airspace opacities noted bilaterally likely related to CHF though superimposed pneumonia would be difficult to entirely exclude. Degenerative changes are seen along the dorsal spine and about both shoulders. IMPRESSION: Cardiomegaly with aortic atherosclerosis. Pulmonary edema with moderate right effusion compatible with changes of CHF. Superimposed bilateral multilobar pneumonia would be difficult to entirely exclude though favor CHF. Electronically Signed   By: Ashley Royalty M.D.   On: 10/19/2017 03:16    Procedures .Critical Care Performed by: Jola Schmidt, MD Authorized by: Jola Schmidt, MD     CRITICAL CARE Performed by: Jola Schmidt Total critical care time: 33 minutes Critical care time was exclusive of separately billable procedures and  treating other patients. Critical care was necessary to treat or prevent imminent or life-threatening deterioration. Critical care was time spent personally by me on the following activities: development of treatment plan with patient and/or surrogate as well as nursing, discussions with consultants, evaluation of patient's response to treatment, examination of patient, obtaining history from patient or surrogate, ordering and performing treatments and interventions, ordering and review of laboratory studies, ordering and review of radiographic studies, pulse oximetry and re-evaluation of patient's condition.   Medications Ordered in ED Medications  diltiazem (CARDIZEM) 1 mg/mL load via infusion 15 mg (15 mg Intravenous Bolus from Bag 10/19/17 0322)    And  diltiazem (CARDIZEM) 100 mg in dextrose 5% 183mL (1 mg/mL) infusion (0 mg/hr Intravenous Stopped 10/19/17 0441)  furosemide (LASIX) injection 80 mg (has no administration in time range)  ipratropium (ATROVENT) nebulizer solution 0.5 mg (has no administration in time range)  levalbuterol (XOPENEX) nebulizer solution 1.25 mg (has no administration in time range)     Initial Impression / Assessment and Plan / ED Course  I have reviewed the triage vital signs and the nursing notes.  Pertinent labs & imaging results that were available during my care of the patient were reviewed by me and considered in my medical decision making (see chart for details).     Patient presenting in respiratory distress.  Initially trialed off of BiPAP however the patient continued having increased work of breathing.  Placed back on BiPAP.  Appears to be volume overload.  IV Lasix now.  Lower suspicion for pneumonia as the patient has had no URI symptoms over the past 48 hours or documented fever per the wife.  It sounds as though his shortness of breath developed suddenly this evening at which point he contacted EMS without awakening his wife.  Chest x-ray  personally reviewed: Demonstrates bilateral edema  Blood cell count 20.4.  Transient hypotension after initiation of Cardizem drip with Cardizem bolus for his A. fib with RVR.  This is thought to be more likely secondary to the ongoing IV Cardizem.  The drip will be stopped.  He is now rate controlled with a rate of 78.   Final Clinical Impressions(s) / ED Diagnoses   Final  diagnoses:  Acute respiratory failure with hypoxia [facility])    ED Discharge Orders    None       Jola Schmidt, MD 10/19/17 [phone]

## 2017-10-19 NOTE — ED Notes (Addendum)
Condom cath placed   Pt getting iv lasix

## 2017-10-19 NOTE — Progress Notes (Signed)
ANTICOAGULATION CONSULT NOTE - Initial Consult  Pharmacy Consult for warfarin Indication: atrial fibrillation  Allergies  Allergen Reactions  . Penicillins Rash    Patient Measurements: Height: 5\' 7"  (170.2 cm) Weight: 151 lb 0.2 oz (68.5 kg) IBW/kg (Calculated) : 66.1  Vital Signs: Temp: 98.2 F (36.8 C) (04/20 0827) Temp Source: Axillary (04/20 0827) BP: 103/59 (04/20 0827) Pulse Rate: 78 (04/20 0827)  Labs: Recent Labs    10/19/17 0308 10/19/17 0309  HGB 11.7*  --   HCT 38.5*  --   PLT 227  --   LABPROT  --  26.4*  INR  --  2.45  CREATININE 1.02  --     Estimated Creatinine Clearance: 45 mL/min (by C-G formula based on SCr of 1.02 mg/dL).   Medical History: Past Medical History:  Diagnosis Date  . Atrial fibrillation ([place])    Persisent   . CVA (cerebral vascular accident) ([place])   . Femur fracture, right [facility]) March 2016  . HLD (hyperlipidemia)   . HTN (hypertension)   . Non-ischemic cardiomyopathy ([place])    LVEF=35-40% by echo 2009  . Prostate cancer ([place])   . TIA (transient ischemic attack)     Medications:  Scheduled:  . atorvastatin  40 mg Oral Daily  . furosemide  40 mg Intravenous BID  . insulin aspart  0-9 Units Subcutaneous TID WC  . ipratropium  0.5 mg Nebulization Q6H  . levalbuterol  1.25 mg Nebulization Q6H  . loratadine  10 mg Oral Daily  . metoprolol succinate  50 mg Oral Daily  . potassium chloride  10 mEq Oral BID  . warfarin  5 mg Oral q1800    Assessment: 84 YO male with Afib on warfarin PTA which he states is 5mg  once daily except Sundays, on Sundays he takes 7.5mg , and his last dose was 4/19 AM.  INR therapeutic today at 2.45, H/H slightly low, plts wnl and no bleeding observed.  Goal of Therapy:  INR 2-3 Monitor platelets by anticoagulation protocol: Yes   Plan:  Give warfarin 5mg  x 1 tonight Daily INR, CBC, s/s bleeding  Bertis Ruddy, PharmD Pharmacy Resident Pager #: [phone] 10/19/2017 10:00 AM

## 2017-10-19 NOTE — ED Triage Notes (Signed)
The pt arrived by [facility] from home respiratory distress at home diminished breath sound s when ems arrivedc.  Absent breath sounds.  sats 82 % at home  He is on home 02.  Pt was given mag sulfate on the way here  Iv per ems  No pain

## 2017-10-19 NOTE — ED Notes (Addendum)
pts wifes number [phone]  She was unable to come to the hospital  Dr Venora Maples talked to her on the phone.  He told her the pt would be admitted

## 2017-10-19 NOTE — Progress Notes (Signed)
Pt arrived to the floor on BIPAP, Alert and orient. Pt wiped down with the CHG bath wipes, heart monitor box applied, CCMD notifed. VSS.  Pt is complaining about the BIPAP mask. Pt is switched to the non-rebreather mask and static 100%. We'll continue to monitor.

## 2017-10-19 NOTE — H&P (Signed)
History and Physical:    Aaron Frye   FBP:[id number] DOB: 03/09/1925 DOA: 10/19/2017  Referring MD/provider: Dr Venora Maples PCP: Stephens Shire, MD   Patient coming from: Home  Chief Complaint: shortness of breath  History of Present Illness:   Aaron Frye is an 84 y.o. male with past medical history significant for atrial fibrillation, diastolic heart failure, pulmonary hypertension, oxygen dependent COPD who was in his usual state of good health until 1:00 this morning when he woke from his recliner to go to the bathroom per his usual routine. Patient noted that he was short of breath on the way to the bathroom however was so short of breath on the way back that he had to stop twice in the kitchen before he got to his recliner which is very unusual for him. He called EMS. When EMS arrived he was noted to be in A. Fib with RVR and hypoxic and was placed on BiPAP and brought in for further treatment.   Patient states that he felt quite well when he went to bed in his recliner per usual last night. He had no fevers, malaise, cough, abdominal pain, dysuria or any other symptoms. Patient states he's unable to tell if he has orthopnea or PND as he always sleeps in a recliner so head of bed is elevated. Patient denies any knowledge of palpitations or rapid heart rate. Patient states he's never been in RVR before as far as he is aware. Patient denies any chest pain or chest tightness or any sort of chest discomfort. His main concern was his dyspnea on exertion on returning from the bathroom and then subsequent shortness of breath at rest.  Of note patient states that he had lost his power for several hours due to weather the previous day. He did not use his supplemental oxygen. Patient denied feeling short of breath off the oxygen however did admit to feeling more tired than usual. Notes he mostly stayed in his recliner while he was off the oxygen. No chest pain or chest discomfort during  this time.  ED Course:  The patient as treated with Cardizem drip which was discontinued after an hour with good rate control, IV Lasix 80 mg with improved shortness of breath but no documented urinary output and BiPAP which was discontinued after respiratory status normalized.  ROS:   ROS   Review of Systems: Per history of present illness  Past Medical History:   Past Medical History:  Diagnosis Date  . Atrial fibrillation (Aaron Frye)    Persisent   . CVA (cerebral vascular accident) (Aaron Frye)   . Femur fracture, right [facility]) March 2016  . HLD (hyperlipidemia)   . HTN (hypertension)   . Non-ischemic cardiomyopathy (Aaron Frye)    LVEF=35-40% by echo 2009  . Prostate cancer (Aaron Frye)   . TIA (transient ischemic attack)     Past Surgical History:   Past Surgical History:  Procedure Laterality Date  . APPENDECTOMY    . INTRAMEDULLARY (IM) NAIL INTERTROCHANTERIC Right 09/24/2014   Procedure: INTRAMEDULLARY (IM) NAIL INTERTROCHANTRIC;  Surgeon: Rod Can, MD;  Location: [facility];  Service: Orthopedics;  Laterality: Right;  . TONSILLECTOMY      Social History:   Social History   Socioeconomic History  . Marital status: Married    Spouse name: Not on file  . Number of children: Not on file  . Years of education: Not on file  . Highest education level: Not on file  Occupational History  .  Occupation: Hospital doctor  Social Needs  . Financial resource strain: Not on file  . Food insecurity:    Worry: Not on file    Inability: Not on file  . Transportation needs:    Medical: Not on file    Non-medical: Not on file  Tobacco Use  . Smoking status: Never Smoker  . Smokeless tobacco: Never Used  Substance and Sexual Activity  . Alcohol use: No  . Drug use: No  . Sexual activity: Not on file  Lifestyle  . Physical activity:    Days per week: Not on file    Minutes per session: Not on file  . Stress: Not on file  Relationships  . Social connections:    Talks on phone: Not on file     Gets together: Not on file    Attends religious service: Not on file    Active member of club or organization: Not on file    Attends meetings of clubs or organizations: Not on file    Relationship status: Not on file  . Intimate partner violence:    Fear of current or ex partner: Not on file    Emotionally abused: Not on file    Physically abused: Not on file    Forced sexual activity: Not on file  Other Topics Concern  . Not on file  Social History Narrative  . Not on file    Allergies   Penicillins  Family history:   Family History  Problem Relation Age of Onset  . Lung disease Father   . Heart failure Mother   . Cancer Brother        Not sure which type of cancer  . Heart attack Brother   . Hypertension Son   . Stroke Neg Hx     Current Medications:   Prior to Admission medications   Medication Sig Start Date End Date Taking? Authorizing Provider  atorvastatin (LIPITOR) 40 MG tablet TAKE ONE TABLET BY MOUTH ONCE DAILY 01/23/17   Josue Hector, MD  ferrous sulfate 325 (65 FE) MG tablet Take 1 tablet (325 mg total) by mouth daily. 06/13/16   Josue Hector, MD  furosemide (LASIX) 20 MG tablet TAKE ONE TABLET BY MOUTH TWICE DAILY 02/21/17   Josue Hector, MD  KLOR-CON M10 10 MEQ tablet TAKE 1 TABLET BY MOUTH TWICE DAILY 09/11/17   Josue Hector, MD  loratadine (CLARITIN) 10 MG tablet Take 1 tablet (10 mg total) by mouth daily. 10/08/14   Angiulli, Lavon Paganini, PA-C  methocarbamol (ROBAXIN) 500 MG tablet Take 1 tablet (500 mg total) by mouth every 6 (six) hours as needed for muscle spasms (qid prn). 10/08/14   Angiulli, Lavon Paganini, PA-C  metoprolol succinate (TOPROL-XL) 50 MG 24 hr tablet TAKE 1 TABLET BY MOUTH ONCE DAILY WITH OR IMMEDIATELY FOLLOWING A MEAL. 06/17/17   Josue Hector, MD  ramipril (ALTACE) 5 MG capsule TAKE 1 CAPSULE BY MOUTH ONCE DAILY 05/02/17   Josue Hector, MD  saccharomyces boulardii (FLORASTOR) 250 MG capsule Take 1 capsule (250 mg total) by  mouth 2 (two) times daily. 10/08/14   Angiulli, Lavon Paganini, PA-C  Sulfamethoxazole-Trimethoprim (BACTRIM PO) Take 1 tablet by mouth 2 (two) times daily.     [provider]  warfarin (COUMADIN) 5 MG tablet TAKE AS DIRECTED BY MOUTH BY COUMADIN CLINIC 06/03/17   Josue Hector, MD    Physical Exam:   Vitals:   10/19/17 0515 10/19/17 0530 10/19/17  0626 10/19/17 0827  BP: (!) 93/59 100/64 112/61 (!) 103/59  Pulse: 79 75 88 78  Resp: (!) 21 20 20  (!) 24  Temp:   97.6 F (36.4 C) 98.2 F (36.8 C)  TempSrc:   Oral Axillary  SpO2: 100% 100% 100% 100%  Weight:   68.5 kg (151 lb 0.2 oz)   Height:   5\' 7"  (1.702 m)      Physical Exam: Blood pressure (!) 103/59, pulse 78, temperature 98.2 F (36.8 C), temperature source Axillary, resp. rate (!) 24, height 5\' 7"  (1.702 m), weight 68.5 kg (151 lb 0.2 oz), SpO2 100 %. Gen: thin man appearing younger than stated age lying at 64 in no respiratory distress. No accessory muscle use. Was able to speak in full sentences without difficulty. Eyes: Sclerae anicteric. Conjunctiva mildly injected. Chest:  Reasonable air entry however he does have rales one quarter of the way up bilaterally with markedly diminished breath sounds halfway up on the right. No wheezes or other adventitious sounds. CV: Distant, irregular, he has a systolic murmur throughout his precordium. I was unable to hear the diastolic component. Abdomen: NABS, soft, nondistended, nontender. No tenderness to light or deep palpation. No rebound, no guarding. Extremities: trace to 1+ edema bilaterally with hyperpigmentation of chronic inflammation in his lower extremities up to his knees. Skin: inflammatory hyperpigmentation as noted above, no acute rashes noted. Neuro: Alert and oriented times 3; grossly nonfocal. He likely has some short-term memory deficits however is generally very coherent. Psych: Patient is cooperative, logical and coherent with appropriate mood and affect.  Data  Review:    Labs: Basic Metabolic Panel: Recent Labs  Lab 10/19/17 0308  NA 135  K 4.5  CL 99*  CO2 23  GLUCOSE 281*  BUN 22*  CREATININE 1.02  CALCIUM 8.8*   Liver Function Tests: No results for input(s): AST, ALT, ALKPHOS, BILITOT, PROT, ALBUMIN in the last 168 hours. No results for input(s): LIPASE, AMYLASE in the last 168 hours. No results for input(s): AMMONIA in the last 168 hours. CBC: Recent Labs  Lab 10/19/17 0308  WBC 20.4*  HGB 11.7*  HCT 38.5*  MCV 78.9  PLT 227   Cardiac Enzymes: No results for input(s): CKTOTAL, CKMB, CKMBINDEX, TROPONINI in the last 168 hours.  BNP (last 3 results) No results for input(s): PROBNP in the last 8760 hours. CBG: No results for input(s): GLUCAP in the last 168 hours.  Urinalysis    Component Value Date/Time   COLORURINE AMBER (A) 10/02/2014 0301   APPEARANCEUR CLOUDY (A) 10/02/2014 0301   LABSPEC 1.022 10/02/2014 0301   PHURINE 5.0 10/02/2014 0301   GLUCOSEU NEGATIVE 10/02/2014 0301   HGBUR MODERATE (A) 10/02/2014 0301   BILIRUBINUR NEGATIVE 10/02/2014 0301   KETONESUR NEGATIVE 10/02/2014 0301   PROTEINUR NEGATIVE 10/02/2014 0301   UROBILINOGEN 0.2 10/02/2014 0301   NITRITE NEGATIVE 10/02/2014 0301   LEUKOCYTESUR SMALL (A) 10/02/2014 0301      Radiographic Studies: Dg Chest Portable 1 View  Result Date: 10/19/2017 CLINICAL DATA:  Respiratory distress EXAM: PORTABLE CHEST 1 VIEW COMPARISON:  07/23/2017 FINDINGS: Cardiomegaly with aortic atherosclerosis. No aneurysm. Moderate right effusion with pulmonary edema. Patchy airspace opacities noted bilaterally likely related to CHF though superimposed pneumonia would be difficult to entirely exclude. Degenerative changes are seen along the dorsal spine and about both shoulders. IMPRESSION: Cardiomegaly with aortic atherosclerosis. Pulmonary edema with moderate right effusion compatible with changes of CHF. Superimposed bilateral multilobar pneumonia would be difficult to  entirely  exclude though favor CHF. Electronically Signed   By: Ashley Royalty M.D.   On: 10/19/2017 03:16    EKG: Independently reviewed. Atrial fibrillation at 120. Mild intraventricular conduction delay. Normal axis. T-wave inversions 3 and V1. Normal R-wave progression. No acute ST-T wave changes noted  Assessment/Plan:   Principal Problem:   Acute on chronic respiratory failure with hypoxia (HCC) Active Problems:   Essential hypertension   Atrial fibrillation with RVR (HCC)   Congestive heart failure (HCC)   Long term current use of anticoagulant therapy   HLD (hyperlipidemia)   Leukocytosis   Diastolic CHF, acute on chronic (HCC)   HYOXIA  Secondary to pulmonary edema on top of baseline oxygen dependent COPD and known pulmonary hypertension.  PULMONARY EDEMA Pulmonary edema most likely secondary to RVR with severe MS causing worsening of diastolic heart failure and consequent pulmonary edema. Of note patient's BNP is only 270 which suggests no acute heart strain at present. He has responded well to Lasix 80 mg IV given in the ED however no urine output is documented. Patient still has rales and decreased breath sounds on examination, will place patient on Lasix 40 mg twice a day 3 doses. Patient is usually on 20 mg by mouth twice a day at home. Repeat chest x-ray ordered for the morning. Will not order repeat echocardiogram as one was done four months ago in January 2019 which revealed normal LV EF but increased wall thickness with mild LVH. with mild dilatation of right ventricle and markedly increased pulmonary artery pressures at 60. He does have severe mitral stenosis/ mild mitral regurg and dilated left atrium. He also has mild aortic stenosis.  ATRIAL FIBRILLATION WITH RVR RVR most likely triggered by several hours of hypoxemia when his power went out as noted in history of present illness. Patient now with good rate control off the diltiazem drip for the past 4 hours. Will  restart patient's metoprolol per his home regimen. Continue warfarin, INR is 2.4, pharmacy requested to helpwith management. EKG without any signs of acute ischemia, initial troponin is 0. Will continue to trend troponin for another 2 recurrences every 6 hours.  LEUKOCYTOSIS WBC is 20 which is markedly elevated from last known WBC of 10 2 years ago. Chest x-ray notes that pneumonia cannot be excluded however it's less likely. Urine with small leuk esterase and rare bacteria so I do not think that is a source. Patient has not received any steroids today.  Given no systemic symptoms of malaise fever or cough dysuria abdominal pain or any other localizing signs. I will not start any antibiotics today. Of note patient was also noted to have higher than anticipated WBC on admission in 2016. Don't see that any further workup has been done. Repeat WBC in the morning.  ELEVATED BLOOD SUGAR Patient with no previous history of diabetes. Blood sugar 280 on routine check today. Will check fingersticks before meals and at bedtime with low-dose sliding scale insulin coverage. Hemoglobin A1c ordered. Patient is 10, would not shoot for tight control upon discharge  COPD No wheezes on exam. Will place patient on Atrovent standing and when necessary leave albuterol  HYPERTENSION  Patient hypotensive on admission when an RVR now resolved. Presently low normotensive despite Lasix administration, Hold ramipril as patient will be getting higher doses of Lasix to diurese. Continue metoprolol ER 50 mg for rate control  END-OF-LIFE DECISION-MAKING Discussed end-of-life preferences with patient and his wife. In regards to resuscitation, patient states that "might as well  go ahead and give it a try but I don't want to be a burden on anybody. Patient's daughter will be coming to visit later today and she is apparently power of attorney. They will discuss decisions further with her.    Other information:   DVT  prophylaxis: on warfarin Code Status: Full code. Family Communication: patient's wife is at bedside throughout Disposition Plan: home Consults called: none Admission status: inpatient   The medical decision making on this patient was of high complexity and the patient is at high risk for clinical deterioration, therefore this is a level 3 visit.  Dewaine Oats Tublu Haylei Cobin [facility]  If 7PM-7AM, please contact night-coverage [email or web address] Password [email or web address] 10/19/2017, 9:20 AM

## 2017-10-20 ENCOUNTER — Inpatient Hospital Stay (HOSPITAL_COMMUNITY): Payer: Medicare Other

## 2017-10-20 DIAGNOSIS — E782 Mixed hyperlipidemia: Secondary | ICD-10-CM

## 2017-10-20 DIAGNOSIS — I4891 Unspecified atrial fibrillation: Secondary | ICD-10-CM

## 2017-10-20 DIAGNOSIS — R778 Other specified abnormalities of plasma proteins: Secondary | ICD-10-CM | POA: Diagnosis present

## 2017-10-20 DIAGNOSIS — R7989 Other specified abnormal findings of blood chemistry: Secondary | ICD-10-CM | POA: Diagnosis present

## 2017-10-20 DIAGNOSIS — I050 Rheumatic mitral stenosis: Secondary | ICD-10-CM

## 2017-10-20 DIAGNOSIS — J9621 Acute and chronic respiratory failure with hypoxia: Secondary | ICD-10-CM

## 2017-10-20 DIAGNOSIS — I5033 Acute on chronic diastolic (congestive) heart failure: Secondary | ICD-10-CM

## 2017-10-20 DIAGNOSIS — R748 Abnormal levels of other serum enzymes: Secondary | ICD-10-CM

## 2017-10-20 LAB — CBC
HEMATOCRIT: 32.4 % — AB (ref 39.0–52.0)
HEMOGLOBIN: 9.9 g/dL — AB (ref 13.0–17.0)
MCH: 23.7 pg — AB (ref 26.0–34.0)
MCHC: 30.6 g/dL (ref 30.0–36.0)
MCV: 77.5 fL — ABNORMAL LOW (ref 78.0–100.0)
Platelets: 167 10*3/uL (ref 150–400)
RBC: 4.18 MIL/uL — ABNORMAL LOW (ref 4.22–5.81)
RDW: 17.4 % — ABNORMAL HIGH (ref 11.5–15.5)
WBC: 12.4 10*3/uL — ABNORMAL HIGH (ref 4.0–10.5)

## 2017-10-20 LAB — GLUCOSE, CAPILLARY
GLUCOSE-CAPILLARY: 131 mg/dL — AB (ref 65–99)
Glucose-Capillary: 109 mg/dL — ABNORMAL HIGH (ref 65–99)
Glucose-Capillary: 114 mg/dL — ABNORMAL HIGH (ref 65–99)
Glucose-Capillary: 163 mg/dL — ABNORMAL HIGH (ref 65–99)

## 2017-10-20 LAB — COMPREHENSIVE METABOLIC PANEL
ALT: 18 U/L (ref 17–63)
AST: 23 U/L (ref 15–41)
Albumin: 2.8 g/dL — ABNORMAL LOW (ref 3.5–5.0)
Alkaline Phosphatase: 84 U/L (ref 38–126)
Anion gap: 9 (ref 5–15)
BUN: 26 mg/dL — AB (ref 6–20)
CHLORIDE: 102 mmol/L (ref 101–111)
CO2: 27 mmol/L (ref 22–32)
CREATININE: 0.93 mg/dL (ref 0.61–1.24)
Calcium: 8.4 mg/dL — ABNORMAL LOW (ref 8.9–10.3)
GFR calc Af Amer: >60 mL/min (ref >60)
Glucose, Bld: 106 mg/dL — ABNORMAL HIGH (ref 65–99)
Potassium: 4.1 mmol/L (ref 3.5–5.1)
Sodium: 138 mmol/L (ref 135–145)
Total Bilirubin: 1.4 mg/dL — ABNORMAL HIGH (ref 0.3–1.2)
Total Protein: 6.4 g/dL — ABNORMAL LOW (ref 6.5–8.1)

## 2017-10-20 LAB — PROTIME-INR
INR: 2.34
PROTHROMBIN TIME: 25.5 s — AB (ref 11.4–15.2)

## 2017-10-20 MED ORDER — METOPROLOL TARTRATE 25 MG PO TABS
25.0000 mg | ORAL_TABLET | Freq: Three times a day (TID) | ORAL | Status: DC
  Administered 2017-10-20 – 2017-10-24 (×10): 25 mg via ORAL
  Filled 2017-10-20 (×10): qty 1

## 2017-10-20 MED ORDER — WARFARIN SODIUM 7.5 MG PO TABS
7.5000 mg | ORAL_TABLET | Freq: Once | ORAL | Status: AC
  Administered 2017-10-20: 7.5 mg via ORAL
  Filled 2017-10-20: qty 1

## 2017-10-20 MED ORDER — PHENOL 1.4 % MT LIQD
1.0000 | OROMUCOSAL | Status: DC | PRN
  Administered 2017-10-20: 1 via OROMUCOSAL
  Filled 2017-10-20: qty 177

## 2017-10-20 MED ORDER — FUROSEMIDE 10 MG/ML IJ SOLN
40.0000 mg | Freq: Two times a day (BID) | INTRAMUSCULAR | Status: DC
  Administered 2017-10-20 – 2017-10-23 (×7): 40 mg via INTRAVENOUS
  Filled 2017-10-20 (×6): qty 4

## 2017-10-20 NOTE — Progress Notes (Signed)
PROGRESS NOTE    Aaron Frye  ZOX:[id number] DOB: [birth year] DOA: 10/19/2017 PCP: Stephens Shire, MD    Brief Narrative:  Aaron Frye is an 84 y.o. male with past medical history significant for atrial fibrillation, diastolic heart failure, pulmonary hypertension, oxygen dependent COPD who was in his usual state of good health until 1:00 this morning when he woke from his recliner to go to the bathroom per his usual routine. Patient noted that he was short of breath on the way to the bathroom however was so short of breath on the way back that he had to stop twice in the kitchen before he got to his recliner which is very unusual for him. He called EMS. When EMS arrived he was noted to be in A. Fib with RVR and hypoxic and was placed on BiPAP and brought in for further treatment.  Initially placed on Cardizem drip which was discontinued after an hour as rate better controlled.  Patient placed on IV Lasix.     Assessment & Plan:   Principal Problem:   Acute on chronic respiratory failure with hypoxia (HCC) Active Problems:   Atrial fibrillation with RVR (HCC)   Diastolic CHF, acute on chronic (HCC)   Essential hypertension   Congestive heart failure ([place])   Long term current use of anticoagulant therapy   HLD (hyperlipidemia)   Leukocytosis   Mitral stenosis   SOB (shortness of breath)   Hyperglycemia   COPD with hypoxia (HCC)   Elevated troponin  #1 acute on chronic respiratory failure with hypoxia secondary to acute on chronic diastolic heart failure and A. fib with RVR On admission was placed on the BiPAP however after given Lasix 80 mg IV x1 as well as placed on the Cardizem drip some improvement in respiratory status and patient subsequently transitioned off the BiPAP.  Patient currently off the BiPAP and on high flow nasal cannula at 3 L.  Cardiac enzymes which was cycled were elevated at 1.01, 1.01, 0.69.  BNP was 270.4.  She with a urine output of 1.4 L over the  past 24 hours.  Current weight is 142 pounds from 158 pounds on admission.  Patient still volume overloaded on examination.  Continue Lasix 40 mg IV every 12 hours, Lipitor, Toprol-XL.  Strict I's and O's.  Daily weights.  Consult with cardiology for further evaluation and management.  2.  Elevated troponins Questionable etiology.  May be secondary to acute coronary syndrome versus secondary to acute CHF exacerbation.  Patient denies any acute chest pain.  EKG with A. fib with no ischemic changes noted.  Continue diuresis.  Patient with recent 2D echo 07/18/2017 with a EF of 65-70%, no wall motion abnormalities, mild aortic valvular stenosis, severe mitral valvular stenosis, moderate to severe tricuspid valvular regurgitation, PA peak pressure of 60 mmHg.  Continue IV diuretics, statin, beta-blocker.  Consult with cardiology for further evaluation and management.  3.  Mitral valvular stenosis/a mild aortic valvular stenosis/moderate to severe tricuspid valvular regurgitation/pulmonary hypertension Chronic in nature.  Continue current cardiac medications of diuretics, statin, beta-blocker.  Consultation with cardiology pending.  4.  COPD Currently stable.  Patient with some minimal expiratory wheezing however wheezing likely cardiac in origin.  Continue Atrovent and Xopenex nebs, Claritin.  Follow.  5.  Hypertension Stable.  Continue diuretics and beta-blocker.  6.  A. fib with RVR Patient on admission was noted to be in A. fib with RVR and placed on the Cardizem drip for an hour  and subsequently drip discontinued.  Rate is currently controlled on home regimen of metoprolol.  Coumadin for anticoagulation.  7.  Hyperlipidemia Continue statin.  8.  Leukocytosis Likely a reactive leukocytosis.  Chest x-ray consistent with volume overload and not acute infiltrate.  Patient afebrile.  She with no urinary symptoms.  Leukocytosis trending down and currently at 12.4 from 20.4 on admission.  No need for  antibiotics at this time.  Follow.    9 elevated blood sugar Hemoglobin A1c of 6.2.  Diet controlled.  Continue sliding scale insulin.  Outpatient follow-up with PCP.    DVT prophylaxis: Coumadin Code Status: Full Family Communication: updated patient.  No family at bedside. Disposition Plan: Remain in stepdown unit today.  Once acute CHF exacerbation has resolved with clinical improvement and per cardiology, could likely be discharged home.   Consultants:   Cardiology: Dr. Johnsie Cancel 10/20/2017 pending  Procedures:   Chest x-ray 10/19/2017, 10/20/2017  Antimicrobials:   None   Subjective: Patient on bedpan.  Off BiPAP and on high flow 3 L nasal cannula.  States improving shortness of breath however not at baseline.  Denies any chest pain.  Objective: Vitals:   10/20/17 0000 10/20/17 0425 10/20/17 0802 10/20/17 0859  BP: 107/61 112/63 127/67 132/78  Pulse: 73 74 85 81  Resp: (!) 31 19 20    Temp: 98 F (36.7 C) 98.1 F (36.7 C) 98.1 F (36.7 C)   TempSrc: Oral Oral Oral   SpO2: 95% 94% 93%   Weight:  64.6 kg (142 lb 6.7 oz)    Height:        Intake/Output Summary (Last 24 hours) at 10/20/2017 1009 Last data filed at 10/20/2017 0429 Gross per 24 hour  Intake 611.5 ml  Output 1200 ml  Net -588.5 ml   Filed Weights   10/19/17 0626 10/19/17 1609 10/20/17 0425  Weight: 68.5 kg (151 lb 0.2 oz) 64.9 kg (143 lb) 64.6 kg (142 lb 6.7 oz)    Examination:  General exam: On 3 L high flow nasal cannula Respiratory system: Diffuse crackles noted.  Minimal expiratory wheezing.  No rhonchi.   Cardiovascular system: Irregularly irregular.  Positive JVD.  No lower extremity edema.  No murmurs rubs or gallops.  Gastrointestinal system: Abdomen is nondistended, soft and nontender. No organomegaly or masses felt. Normal bowel sounds heard. Central nervous system: Alert and oriented. No focal neurological deficits. Extremities: Symmetric 5 x 5 power. Skin: No rashes, lesions or  ulcers Psychiatry: Judgement and insight appear normal. Mood & affect appropriate.     Data Reviewed: I have personally reviewed following labs and imaging studies  CBC: Recent Labs  Lab 10/19/17 0308 10/20/17 0234  WBC 20.4* 12.4*  HGB 11.7* 9.9*  HCT 38.5* 32.4*  MCV 78.9 77.5*  PLT 227 381   Basic Metabolic Panel: Recent Labs  Lab 10/19/17 0308 10/20/17 0234  NA 135 138  K 4.5 4.1  CL 99* 102  CO2 23 27  GLUCOSE 281* 106*  BUN 22* 26*  CREATININE 1.02 0.93  CALCIUM 8.8* 8.4*   GFR: Estimated Creatinine Clearance: 48.2 mL/min (by C-G formula based on SCr of 0.93 mg/dL). Liver Function Tests: Recent Labs  Lab 10/20/17 0234  AST 23  ALT 18  ALKPHOS 84  BILITOT 1.4*  PROT 6.4*  ALBUMIN 2.8*   No results for input(s): LIPASE, AMYLASE in the last 168 hours. No results for input(s): AMMONIA in the last 168 hours. Coagulation Profile: Recent Labs  Lab 10/19/17 0309 10/20/17 0234  INR 2.45 2.34   Cardiac Enzymes: Recent Labs  Lab 10/19/17 0935 10/19/17 1434 10/19/17 2040  TROPONINI 1.01* 1.01* 0.69*   BNP (last 3 results) No results for input(s): PROBNP in the last 8760 hours. HbA1C: Recent Labs    10/19/17 0935  HGBA1C 6.2*   CBG: Recent Labs  Lab 10/19/17 1133 10/19/17 1612 10/19/17 2105 10/20/17 0604  GLUCAP 172* 93 114* 109*   Lipid Profile: No results for input(s): CHOL, HDL, LDLCALC, TRIG, CHOLHDL, LDLDIRECT in the last 72 hours. Thyroid Function Tests: No results for input(s): TSH, T4TOTAL, FREET4, T3FREE, THYROIDAB in the last 72 hours. Anemia Panel: No results for input(s): VITAMINB12, FOLATE, FERRITIN, TIBC, IRON, RETICCTPCT in the last 72 hours. Sepsis Labs: No results for input(s): PROCALCITON, LATICACIDVEN in the last 168 hours.  No results found for this or any previous visit (from the past 240 hour(s)).       Radiology Studies: X-ray Chest Pa And Lateral  Result Date: 10/20/2017 CLINICAL DATA:  Shortness of  breath on exertion. EXAM: CHEST - 2 VIEW COMPARISON:  10/19/2017. FINDINGS: Stable enlarged cardiac silhouette diffusely prominent pulmonary vasculature and interstitial markings with significantly less bilateral airspace opacity. Increased right pleural fluid. Small left pleural effusion. No acute bony abnormality. IMPRESSION: 1. Significantly improved bilateral alveolar edema. 2. Bilateral pleural effusions, mildly increased. 3. Cardiomegaly, pulmonary vascular congestion and some residual interstitial pulmonary edema. Electronically Signed   By: Claudie Revering M.D.   On: 10/20/2017 09:40   Dg Chest Portable 1 View  Result Date: 10/19/2017 CLINICAL DATA:  Respiratory distress EXAM: PORTABLE CHEST 1 VIEW COMPARISON:  07/23/2017 FINDINGS: Cardiomegaly with aortic atherosclerosis. No aneurysm. Moderate right effusion with pulmonary edema. Patchy airspace opacities noted bilaterally likely related to CHF though superimposed pneumonia would be difficult to entirely exclude. Degenerative changes are seen along the dorsal spine and about both shoulders. IMPRESSION: Cardiomegaly with aortic atherosclerosis. Pulmonary edema with moderate right effusion compatible with changes of CHF. Superimposed bilateral multilobar pneumonia would be difficult to entirely exclude though favor CHF. Electronically Signed   By: Ashley Royalty M.D.   On: 10/19/2017 03:16        Scheduled Meds: . atorvastatin  40 mg Oral q1800  . furosemide  40 mg Intravenous BID  . insulin aspart  0-9 Units Subcutaneous TID WC  . ipratropium  0.5 mg Nebulization TID  . levalbuterol  1.25 mg Nebulization TID  . loratadine  10 mg Oral Daily  . metoprolol succinate  50 mg Oral Daily  . potassium chloride  10 mEq Oral BID  . Warfarin - Pharmacist Dosing Inpatient   Does not apply q1800   Continuous Infusions:   LOS: 1 day    Time spent: 40 minutes    Irine Seal, MD [facility] Pager [phone]  If 7PM-7AM, please  contact night-coverage [email or web address] Password [email or web address] 10/20/2017, 10:09 AM

## 2017-10-20 NOTE — Progress Notes (Signed)
Weaned to 4lpm [place].

## 2017-10-20 NOTE — Plan of Care (Signed)
  Problem: Education: Goal: Knowledge of disease or condition will improve Outcome: Progressing Goal: Knowledge of the prescribed therapeutic regimen will improve Outcome: Progressing   Problem: Activity: Goal: Ability to tolerate increased activity will improve Outcome: Progressing Goal: Will verbalize the importance of balancing activity with adequate rest periods Outcome: Progressing   Problem: Respiratory: Goal: Ability to maintain a clear airway will improve Outcome: Progressing Goal: Levels of oxygenation will improve Outcome: Progressing Goal: Ability to maintain adequate ventilation will improve Outcome: Progressing   

## 2017-10-20 NOTE — Progress Notes (Signed)
[place] for warfarin Indication: atrial fibrillation  Allergies  Allergen Reactions  . Penicillins Rash    Has patient had a PCN reaction causing immediate rash, facial/tongue/throat swelling, SOB or lightheadedness with hypotension: Yes Has patient had a PCN reaction causing severe rash involving mucus membranes or skin necrosis: Unk Has patient had a PCN reaction that required hospitalization: No Has patient had a PCN reaction occurring within the last 10 years: No If all of the above answers are "NO", then may proceed with Cephalosporin use.    Labs: Recent Labs    10/19/17 0308 10/19/17 0309 10/19/17 0935 10/19/17 1434 10/19/17 2040 10/20/17 0234  HGB 11.7*  --   --   --   --  9.9*  HCT 38.5*  --   --   --   --  32.4*  PLT 227  --   --   --   --  167  LABPROT  --  26.4*  --   --   --  25.5*  INR  --  2.45  --   --   --  2.34  CREATININE 1.02  --   --   --   --  0.93  TROPONINI  --   --  1.01* 1.01* 0.69*  --     Estimated Creatinine Clearance: 48.2 mL/min (by C-G formula based on SCr of 0.93 mg/dL).   Assessment: 84 YO male with Afib on warfarin PTA which he states is 5mg  once daily except Sundays, on Sundays he takes 7.5mg , and his last dose was 4/19 AM.  INR therapeutic on admission at 2.45   INR therapeutic today  Goal of Therapy:  INR 2-3 Monitor platelets by anticoagulation protocol: Yes   Plan:  Give warfarin 7.5mg  x 1 tonight Daily INR, CBC, s/s bleeding  Thank you Anette Guarneri, PharmD [phone] 10/20/2017 12:45 PM

## 2017-10-20 NOTE — Evaluation (Signed)
Physical Therapy Evaluation Patient Details Name: Aaron Frye MRN: [id number] DOB: April 16, 1925 Today's Date: 10/20/2017   History of Present Illness  Pt adm with acute on chronic respiratory failure due to acute on chronic heart failure and afib with rvr. PMH - rt femur fx, O2 dependent copd, diastolic heart failure  Clinical Impression  Pt admitted with above diagnosis and presents to PT with functional limitations due to deficits listed below (See PT problem list). Pt needs skilled PT to maximize independence and safety to allow discharge to home with wife and HHPT. Pt motivated to return to independence. Expect pt to make good progress with mobility.     Follow Up Recommendations Home health PT    Equipment Recommendations  Other (comment)(May need another RW since wife uses his)    Recommendations for Other Services       Precautions / Restrictions Precautions Precautions: Fall Restrictions Weight Bearing Restrictions: No      Mobility  Bed Mobility               General bed mobility comments: Pt up in chair  Transfers Overall transfer level: Needs assistance Equipment used: None Transfers: Sit to/from Stand Sit to Stand: Min guard         General transfer comment: Assist for safety. Incr time and effort to rise  Ambulation/Gait Ambulation/Gait assistance: Min guard Ambulation Distance (Feet): 200 Feet Assistive device: Rolling walker (2 wheeled) Gait Pattern/deviations: Step-through pattern;Decreased stride length;Trunk flexed Gait velocity: decr Gait velocity interpretation: 1.31 - 2.62 ft/sec, indicative of limited community ambulator General Gait Details: Assist for safety. Pt amb on 3L of O2 with SpO2 97% after amb  Stairs            Wheelchair Mobility    Modified Rankin (Stroke Patients Only)       Balance Overall balance assessment: Needs assistance Sitting-balance support: No upper extremity supported;Feet supported Sitting  balance-Leahy Scale: Good     Standing balance support: No upper extremity supported;During functional activity Standing balance-Leahy Scale: Fair                               Pertinent Vitals/Pain Pain Assessment: No/denies pain    Home Living Family/patient expects to be discharged to:: Private residence Living Arrangements: Spouse/significant other Available Help at Discharge: Family;Available 24 hours/day(wife unable to physically assist) Type of Home: House Home Access: Ramped entrance     Home Layout: One level Home Equipment: [place] - 2 wheels;Shower seat(O2) Additional Comments: wife uses his rolling walker    Prior Function Level of Independence: Independent               Hand Dominance   Dominant Hand: Right    Extremity/Trunk Assessment   Upper Extremity Assessment Upper Extremity Assessment: Defer to OT evaluation    Lower Extremity Assessment Lower Extremity Assessment: Generalized weakness       Communication   Communication: HOH  Cognition Arousal/Alertness: Awake/alert Behavior During Therapy: WFL for tasks assessed/performed Overall Cognitive Status: Within Functional Limits for tasks assessed                                        General Comments      Exercises     Assessment/Plan    PT Assessment Patient needs continued PT services  PT Problem List Decreased strength;Decreased  activity tolerance;Decreased balance;Decreased mobility       PT Treatment Interventions DME instruction;Gait training;Functional mobility training;Therapeutic activities;Therapeutic exercise;Balance training;Patient/family education    PT Goals (Current goals can be found in the Care Plan section)  Acute Rehab PT Goals Patient Stated Goal: return home PT Goal Formulation: With patient/family Time For Goal Achievement: 10/27/17 Potential to Achieve Goals: Good    Frequency Min 3X/week   Barriers to discharge         Co-evaluation               AM-PAC PT "6 Clicks" Daily Activity  Outcome Measure Difficulty turning over in bed (including adjusting bedclothes, sheets and blankets)?: A Little Difficulty moving from lying on back to sitting on the side of the bed? : A Little Difficulty sitting down on and standing up from a chair with arms (e.g., wheelchair, bedside commode, etc,.)?: Unable Help needed moving to and from a bed to chair (including a wheelchair)?: A Little Help needed walking in hospital room?: A Little Help needed climbing 3-5 steps with a railing? : A Little 6 Click Score: 16    End of Session Equipment Utilized During Treatment: Oxygen Activity Tolerance: Patient limited by fatigue Patient left: in chair;with call bell/phone within reach;with family/visitor present Nurse Communication: Mobility status PT Visit Diagnosis: Unsteadiness on feet (R26.81);Muscle weakness (generalized) (M62.81)    Time: 8325-4982 PT Time Calculation (min) (ACUTE ONLY): 18 min   Charges:   PT Evaluation $PT Eval Moderate Complexity: 1 Mod     PT G CodesMarland Kitchen        [facility] PT [place] 10/20/2017, 5:04 PM

## 2017-10-20 NOTE — Consult Note (Signed)
CARDIOLOGY CONSULT NOTE       Patient ID: MAMIE DIIORIO MRN: [id number] DOB/AGE: January 04, 1925 84 y.o.  Admit date: 10/19/2017 Referring Physician: Grandville Silos Primary Physician: Stephens Shire, MD Primary Cardiologist: Johnsie Cancel Reason for Consultation: Elevated Troponin and CHF  Principal Problem:   Acute on chronic respiratory failure with hypoxia [facility]) Active Problems:   Essential hypertension   Atrial fibrillation with RVR ([place])   Congestive heart failure ([place])   Long term current use of anticoagulant therapy   HLD (hyperlipidemia)   Leukocytosis   Diastolic CHF, acute on chronic (HCC)   Mitral stenosis   SOB (shortness of breath)   Hyperglycemia   COPD with hypoxia (HCC)   Elevated troponin   HPI:  84 y.o. history of HTN chronic afib with previous CVA on chronic coumadin. Last echo 07/18/17 with EF 65-70% mild AS severe functional MS from MAC mean gradient 12 mmHg and moderate MR. History of pneumonia earlier in year. Has had oxygen at home. Increasing dyspnea last 48 hours with care complicated by power outage at Home and unable to use his oxygen. Admitted with 48 hours of increasing dyspnea. No chest pain or palpitations no fever or cough On admission CXR consistent with CHF BNP only 270 but good response to diuretic. Troponin peak 1.01 down this am to .69 No acute ECG changes Afib rate a bit high and INR Rx. This am he feels better but Still with dyspnea when he exerts himself. Compliant with meds. Has chronic LE edema and history of venous stasis and ulcers.    ROS All other systems reviewed and negative except as noted above  Past Medical History:  Diagnosis Date  . Atrial fibrillation ([place])    Persisent   . CVA (cerebral vascular accident) ([place])   . Femur fracture, right [facility]) March 2016  . HLD (hyperlipidemia)   . HTN (hypertension)   . Non-ischemic cardiomyopathy ([place])    LVEF=35-40% by echo 2009  . Prostate cancer ([place])   . TIA (transient ischemic attack)      Family History  Problem Relation Age of Onset  . Lung disease Father   . Heart failure Mother   . Cancer Brother        Not sure which type of cancer  . Heart attack Brother   . Hypertension Son   . Stroke Neg Hx     Social History   Socioeconomic History  . Marital status: Married    Spouse name: Not on file  . Number of children: Not on file  . Years of education: Not on file  . Highest education level: Not on file  Occupational History  . Occupation: Hospital doctor  Social Needs  . Financial resource strain: Not on file  . Food insecurity:    Worry: Not on file    Inability: Not on file  . Transportation needs:    Medical: Not on file    Non-medical: Not on file  Tobacco Use  . Smoking status: Never Smoker  . Smokeless tobacco: Never Used  Substance and Sexual Activity  . Alcohol use: No  . Drug use: No  . Sexual activity: Not on file  Lifestyle  . Physical activity:    Days per week: Not on file    Minutes per session: Not on file  . Stress: Not on file  Relationships  . Social connections:    Talks on phone: Not on file    Gets together: Not on file  Attends religious service: Not on file    Active member of club or organization: Not on file    Attends meetings of clubs or organizations: Not on file    Relationship status: Not on file  . Intimate partner violence:    Fear of current or ex partner: Not on file    Emotionally abused: Not on file    Physically abused: Not on file    Forced sexual activity: Not on file  Other Topics Concern  . Not on file  Social History Narrative  . Not on file    Past Surgical History:  Procedure Laterality Date  . APPENDECTOMY    . INTRAMEDULLARY (IM) NAIL INTERTROCHANTERIC Right 09/24/2014   Procedure: INTRAMEDULLARY (IM) NAIL INTERTROCHANTRIC;  Surgeon: Rod Can, MD;  Location: [place];  Service: Orthopedics;  Laterality: Right;  . TONSILLECTOMY       . atorvastatin  40 mg Oral q1800  . furosemide   40 mg Intravenous BID  . insulin aspart  0-9 Units Subcutaneous TID WC  . ipratropium  0.5 mg Nebulization TID  . levalbuterol  1.25 mg Nebulization TID  . loratadine  10 mg Oral Daily  . metoprolol succinate  50 mg Oral Daily  . potassium chloride  10 mEq Oral BID  . Warfarin - Pharmacist Dosing Inpatient   Does not apply q1800     Physical Exam: Blood pressure 132/78, pulse 81, temperature 98.1 F (36.7 C), temperature source Oral, resp. rate 20, height 5' 7"  (1.702 m), weight 142 lb 6.7 oz (64.6 kg), SpO2 93 %.   Affect appropriate Chronically ill kyphotic white male  HEENT: normal Neck supple with no adenopathy JVP normal no bruits no thyromegaly Lungs bilateral rales no  wheezing and good diaphragmatic motion Heart:  S1/S2 SEM murmur, no rub, gallop or click PMI normal Abdomen: benighn, BS positve, no tenderness, no AAA no bruit.  No HSM or HJR Distal pulses intact with no bruits Plus one edema with stasis changes  Neuro non-focal Skin warm and dry No muscular weakness  Labs:   Lab Results  Component Value Date   WBC 12.4 (H) 10/20/2017   HGB 9.9 (L) 10/20/2017   HCT 32.4 (L) 10/20/2017   MCV 77.5 (L) 10/20/2017   PLT 167 10/20/2017    Recent Labs  Lab 10/20/17 0234  NA 138  K 4.1  CL 102  CO2 27  BUN 26*  CREATININE 0.93  CALCIUM 8.4*  PROT 6.4*  BILITOT 1.4*  ALKPHOS 84  ALT 18  AST 23  GLUCOSE 106*   Lab Results  Component Value Date   CKTOTAL 27 11/26/2007   CKMB 1.8 11/26/2007   TROPONINI 0.69 (HH) 10/19/2017    Lab Results  Component Value Date   CHOL 97 09/24/2014   CHOL  11/26/2007    152        ATP III CLASSIFICATION:  <200     mg/dL   Desirable  200-239  mg/dL   Borderline High  >=240    mg/dL   High   Lab Results  Component Value Date   HDL 28 (L) 09/24/2014   HDL 26 (L) 11/26/2007   Lab Results  Component Value Date   LDLCALC 58 09/24/2014   LDLCALC (H) 11/26/2007    104        Total Cholesterol/HDL:CHD  Risk Coronary Heart Disease Risk Table  Men   Women  1/2 Average Risk   3.4   3.3   Lab Results  Component Value Date   TRIG 53 09/24/2014   TRIG 111 11/26/2007   Lab Results  Component Value Date   CHOLHDL 3.5 09/24/2014   CHOLHDL 5.8 11/26/2007   No results found for: LDLDIRECT    Radiology: X-ray Chest Pa And Lateral  Result Date: 10/20/2017 CLINICAL DATA:  Shortness of breath on exertion. EXAM: CHEST - 2 VIEW COMPARISON:  10/19/2017. FINDINGS: Stable enlarged cardiac silhouette diffusely prominent pulmonary vasculature and interstitial markings with significantly less bilateral airspace opacity. Increased right pleural fluid. Small left pleural effusion. No acute bony abnormality. IMPRESSION: 1. Significantly improved bilateral alveolar edema. 2. Bilateral pleural effusions, mildly increased. 3. Cardiomegaly, pulmonary vascular congestion and some residual interstitial pulmonary edema. Electronically Signed   By: Claudie Revering M.D.   On: 10/20/2017 09:40   Dg Chest Portable 1 View  Result Date: 10/19/2017 CLINICAL DATA:  Respiratory distress EXAM: PORTABLE CHEST 1 VIEW COMPARISON:  07/23/2017 FINDINGS: Cardiomegaly with aortic atherosclerosis. No aneurysm. Moderate right effusion with pulmonary edema. Patchy airspace opacities noted bilaterally likely related to CHF though superimposed pneumonia would be difficult to entirely exclude. Degenerative changes are seen along the dorsal spine and about both shoulders. IMPRESSION: Cardiomegaly with aortic atherosclerosis. Pulmonary edema with moderate right effusion compatible with changes of CHF. Superimposed bilateral multilobar pneumonia would be difficult to entirely exclude though favor CHF. Electronically Signed   By: Ashley Royalty M.D.   On: 10/19/2017 03:16    EKG: Afib T wave inversions in lead 3,F    ASSESSMENT AND PLAN:   CHF:  Acute diastolic likely related to mitral valve disease with mixed MS/MR. Continue  oxygen diuresis and increase beta blocker to maximize diastolic filling time Will likely need 48 hours more in hospital  Afib:  Chronic INR Rx previous stroke see above regarding increasing beta blocker   HLD:  Continue statin   Troponin mild elevation with no chest pain no history of CAD and no acute ECG changes observe trending down already no plans for invasive evaluation    Signed: Jenkins Rouge 10/20/2017, 11:08 AM

## 2017-10-21 LAB — BASIC METABOLIC PANEL
ANION GAP: 7 (ref 5–15)
BUN: 25 mg/dL — ABNORMAL HIGH (ref 6–20)
CO2: 29 mmol/L (ref 22–32)
Calcium: 8.5 mg/dL — ABNORMAL LOW (ref 8.9–10.3)
Chloride: 100 mmol/L — ABNORMAL LOW (ref 101–111)
Creatinine, Ser: 0.88 mg/dL (ref 0.61–1.24)
Glucose, Bld: 110 mg/dL — ABNORMAL HIGH (ref 65–99)
Potassium: 4.1 mmol/L (ref 3.5–5.1)
SODIUM: 136 mmol/L (ref 135–145)

## 2017-10-21 LAB — URINALYSIS, ROUTINE W REFLEX MICROSCOPIC
Bilirubin Urine: NEGATIVE
Glucose, UA: NEGATIVE mg/dL
Ketones, ur: NEGATIVE mg/dL
Nitrite: NEGATIVE
PH: 6 (ref 5.0–8.0)
Protein, ur: 30 mg/dL — AB
SPECIFIC GRAVITY, URINE: 1.012 (ref 1.005–1.030)

## 2017-10-21 LAB — CBC
HEMATOCRIT: 34.9 % — AB (ref 39.0–52.0)
Hemoglobin: 10.8 g/dL — ABNORMAL LOW (ref 13.0–17.0)
MCH: 23.9 pg — ABNORMAL LOW (ref 26.0–34.0)
MCHC: 30.9 g/dL (ref 30.0–36.0)
MCV: 77.4 fL — ABNORMAL LOW (ref 78.0–100.0)
Platelets: 189 10*3/uL (ref 150–400)
RBC: 4.51 MIL/uL (ref 4.22–5.81)
RDW: 17.7 % — AB (ref 11.5–15.5)
WBC: 15.4 10*3/uL — AB (ref 4.0–10.5)

## 2017-10-21 LAB — GLUCOSE, CAPILLARY
GLUCOSE-CAPILLARY: 106 mg/dL — AB (ref 65–99)
Glucose-Capillary: 163 mg/dL — ABNORMAL HIGH (ref 65–99)
Glucose-Capillary: 104 mg/dL — ABNORMAL HIGH (ref 65–99)
Glucose-Capillary: 113 mg/dL — ABNORMAL HIGH (ref 65–99)

## 2017-10-21 LAB — PROTIME-INR
INR: 2.2
PROTHROMBIN TIME: 24.2 s — AB (ref 11.4–15.2)

## 2017-10-21 LAB — MAGNESIUM: MAGNESIUM: 2.3 mg/dL (ref 1.7–2.4)

## 2017-10-21 MED ORDER — WARFARIN SODIUM 5 MG PO TABS: 5.0000 mg | ORAL_TABLET | ORAL | Status: DC

## 2017-10-21 MED ORDER — WARFARIN SODIUM 7.5 MG PO TABS: 7.5000 mg | ORAL_TABLET | ORAL | Status: DC

## 2017-10-21 MED ORDER — IPRATROPIUM-ALBUTEROL 0.5-2.5 (3) MG/3ML IN SOLN
3.0000 mL | Freq: Four times a day (QID) | RESPIRATORY_TRACT | Status: DC | PRN
Start: 2017-10-21 — End: 2017-10-22

## 2017-10-21 MED ORDER — PROSIGHT PO TABS
1.0000 | ORAL_TABLET | Freq: Every day | ORAL | Status: DC
  Administered 2017-10-21 – 2017-10-24 (×4): 1 via ORAL
  Filled 2017-10-21 (×5): qty 1

## 2017-10-21 MED ORDER — WARFARIN SODIUM 5 MG PO TABS
5.0000 mg | ORAL_TABLET | ORAL | Status: AC
  Administered 2017-10-21: 5 mg via ORAL
  Filled 2017-10-21: qty 1

## 2017-10-21 MED ORDER — PNEUMOCOCCAL VAC POLYVALENT 25 MCG/0.5ML IJ INJ: 0.5000 mL | INJECTION | INTRAMUSCULAR | Status: DC | PRN

## 2017-10-21 NOTE — Progress Notes (Signed)
[place] for warfarin Indication: atrial fibrillation  Allergies  Allergen Reactions  . Penicillins Rash    Has patient had a PCN reaction causing immediate rash, facial/tongue/throat swelling, SOB or lightheadedness with hypotension: Yes Has patient had a PCN reaction causing severe rash involving mucus membranes or skin necrosis: Unk Has patient had a PCN reaction that required hospitalization: No Has patient had a PCN reaction occurring within the last 10 years: No If all of the above answers are "NO", then may proceed with Cephalosporin use.    Labs: Recent Labs    10/19/17 0308 10/19/17 0309 10/19/17 0935 10/19/17 1434 10/19/17 2040 10/20/17 0234 10/21/17 0327  HGB 11.7*  --   --   --   --  9.9* 10.8*  HCT 38.5*  --   --   --   --  32.4* 34.9*  PLT 227  --   --   --   --  167 189  LABPROT  --  26.4*  --   --   --  25.5* 24.2*  INR  --  2.45  --   --   --  2.34 2.20  CREATININE 1.02  --   --   --   --  0.93 0.88  TROPONINI  --   --  1.01* 1.01* 0.69*  --   --    Estimated Creatinine Clearance: 51.7 mL/min (by C-G formula based on SCr of 0.88 mg/dL).  Assessment: 84 YO male with Afib on warfarin PTA which he states is 5mg  once daily except Sundays, on Sundays he takes 7.5mg , and his last dose was 4/19 AM.  INR therapeutic on admission at 2.45 and remains in goal at 2.2 on current regimen.  Goal of Therapy:  INR 2-3 Monitor platelets by anticoagulation protocol: Yes   Plan:  Give warfarin 5mg  per home regimen Daily INR, CBC, s/s bleeding  Rober Minion, PharmD., MS Clinical Pharmacist Pager:  [phone] Thank you for allowing pharmacy to be part of this patients care team. 10/21/2017 8:42 AM

## 2017-10-21 NOTE — Progress Notes (Addendum)
Progress Note  Patient Name: Aaron Frye Date of Encounter: 10/21/2017  Primary Cardiologist: Jenkins Rouge, MD   Subjective   Breathing not quite back to baseline. Otherwise no complaints of CP or palpitations.   Inpatient Medications    Scheduled Meds: . atorvastatin  40 mg Oral q1800  . furosemide  40 mg Intravenous BID  . insulin aspart  0-9 Units Subcutaneous TID WC  . ipratropium  0.5 mg Nebulization TID  . levalbuterol  1.25 mg Nebulization TID  . loratadine  10 mg Oral Daily  . metoprolol tartrate  25 mg Oral TID  . potassium chloride  10 mEq Oral BID  . [START ON 10/22/2017] warfarin  5 mg Oral Q T,Th,Sat-1800  . warfarin  5 mg Oral Q M,W,F-1800  . [START ON 10/27/2017] warfarin  7.5 mg Oral Q Sun  . Warfarin - Pharmacist Dosing Inpatient   Does not apply q1800   Continuous Infusions:  PRN Meds: acetaminophen **OR** acetaminophen, bisacodyl, levalbuterol, phenol, polyethylene glycol   Vital Signs    Vitals:   10/21/17 0326 10/21/17 0800 10/21/17 0815 10/21/17 0830  BP: 120/69 128/72    Pulse: 72 78 78 80  Resp: 20 (!) 36 (!) 36 19  Temp: 98.4 F (36.9 C) 98 F (36.7 C)    TempSrc: Oral Oral    SpO2: 96% 99% 96% 98%  Weight: 144 lb 6.4 oz (65.5 kg)     Height:        Intake/Output Summary (Last 24 hours) at 10/21/2017 1007 Last data filed at 10/21/2017 0300 Gross per 24 hour  Intake 582 ml  Output 2025 ml  Net -1443 ml   Filed Weights   10/19/17 1609 10/20/17 0425 10/21/17 0326  Weight: 143 lb (64.9 kg) 142 lb 6.7 oz (64.6 kg) 144 lb 6.4 oz (65.5 kg)    Telemetry    Atrial fibrillation with controlled rate - Personally Reviewed  Physical Exam   GEN: Elderly gentleman sitting upright in bed in no acute distress.   Neck: JVP normal     Cardiac: IRRR, +murmur, no rubs or gallops.  Respiratory: crackles at bases b/l; no wheezing or rhonchi noted GI: NABS, Soft, nontender, non-distended  MS: trace edema; No deformity. Chronic venous stasis  skin changes Neuro:  Nonfocal, moving all extremities spontaneously Psych: Normal affect   Labs    Chemistry Recent Labs  Lab 10/19/17 0308 10/20/17 0234 10/21/17 0327  NA 135 138 136  K 4.5 4.1 4.1  CL 99* 102 100*  CO2 23 27 29   GLUCOSE 281* 106* 110*  BUN 22* 26* 25*  CREATININE 1.02 0.93 0.88  CALCIUM 8.8* 8.4* 8.5*  PROT  --  6.4*  --   ALBUMIN  --  2.8*  --   AST  --  23  --   ALT  --  18  --   ALKPHOS  --  84  --   BILITOT  --  1.4*  --   GFRNONAA >60 >60 >60  GFRAA >60 >60 >60  ANIONGAP 13 9 7      Hematology Recent Labs  Lab 10/19/17 0308 10/20/17 0234 10/21/17 0327  WBC 20.4* 12.4* 15.4*  RBC 4.88 4.18* 4.51  HGB 11.7* 9.9* 10.8*  HCT 38.5* 32.4* 34.9*  MCV 78.9 77.5* 77.4*  MCH 24.0* 23.7* 23.9*  MCHC 30.4 30.6 30.9  RDW 17.8* 17.4* 17.7*  PLT 227 167 189    Cardiac Enzymes Recent Labs  Lab 10/19/17 0935 10/19/17 1434  10/19/17 2040  TROPONINI 1.01* 1.01* 0.69*    Recent Labs  Lab 10/19/17 0308  TROPIPOC 0.00     BNP Recent Labs  Lab 10/19/17 0310  BNP 270.4*     DDimer No results for input(s): DDIMER in the last 168 hours.   Radiology    X-ray Chest Pa And Lateral  Result Date: 10/20/2017 CLINICAL DATA:  Shortness of breath on exertion. EXAM: CHEST - 2 VIEW COMPARISON:  10/19/2017. FINDINGS: Stable enlarged cardiac silhouette diffusely prominent pulmonary vasculature and interstitial markings with significantly less bilateral airspace opacity. Increased right pleural fluid. Small left pleural effusion. No acute bony abnormality. IMPRESSION: 1. Significantly improved bilateral alveolar edema. 2. Bilateral pleural effusions, mildly increased. 3. Cardiomegaly, pulmonary vascular congestion and some residual interstitial pulmonary edema. Electronically Signed   By: Claudie Revering M.D.   On: 10/20/2017 09:40    Cardiac Studies   Echocardiogram 07/2017: Study Conclusions  - Left ventricle: The cavity size was normal. Wall thickness  was   increased in a pattern of mild LVH. Systolic function was   vigorous. The estimated ejection fraction was in the range of 65%   to 70%. Wall motion was normal; there were no regional wall   motion abnormalities. - Aortic valve: Moderately calcified annulus. Severely thickened,   severely calcified leaflets. There was mild stenosis. - Mitral valve: Moderately dilated annulus. Severely calcified   leaflets . The findings are consistent with severe stenosis.   There was moderate regurgitation. Valve area by pressure   half-time: 1.68 cm^2. Valve area by continuity equation (using   LVOT flow): 0.7 cm^2. - Left atrium: The atrium was severely dilated. - Right ventricle: The cavity size was mildly dilated. Systolic   function was mildly reduced. - Right atrium: The atrium was mildly to moderately dilated. - Tricuspid valve: There was moderate-severe regurgitation. - Pulmonary arteries: Systolic pressure was moderately to severely   increased. PA peak pressure: 60 mm Hg (S).   Patient Profile     84 y.o. male with chronic atrial fibrillation, chronic diastolic CHF, severe mitral stenosis, HTN, pulmHTN, COPD on 2L [place], who presented with DOE after power outage limited use of home O2 in the last 48 hours.   Assessment & Plan    1. Acute on chronic respiratory failure 2/2 acute on chronic diastolic CHF: p/w SOB and DOE. BNP 270. CXR with pulmonary vascular congestion. Patient was started on IV lasix BID with good response: UOP with net -1.3L in the past 24 hours and -2.1L this admission. Wt 151lbs >144lbs this am. Cr and electrolytes stable this AM. Weaned O2 to 2L via [place] (baseline)  - Continue IV lasix 40mg  BID - Metoprolol increased to 25mg  TID - Continue to monitor strict I&Os and daily weights  2. Elevated troponin:  No complaints of chest pain. Trop trend 1.01>1.01>0.69. Likely contributed to by CHF exacerbation and Afib RVR. - No further ischemic evaluation at this time  3.  Atrial fibrillation: rate well controlled. INR 2.2 today - Continue metoprolol and coumadin  4  Mitral stenosis  Severe by echo   5 HTN: BP well controlled  - Continue current regimen  6. HLD: no recent lipids - Continue atorvastatin   7. Leukocytosis: with complaints of lower abdominal pain with urination. UA/UCx ordered - Continue management per primary team   For questions or updates, please contact [place] Please consult [email or web address] for contact info under Cardiology/STEMI.      Signed, Abigail Butts, PA-C  10/21/2017,  10:07 AM   [phone]

## 2017-10-21 NOTE — Progress Notes (Signed)
PROGRESS NOTE    Aaron Frye  JJO:[id number] DOB: 19-Nov-1924 DOA: 10/19/2017 PCP: Stephens Shire, MD    Brief Narrative:  Aaron Frye is an 84 y.o. male with past medical history significant for atrial fibrillation, diastolic heart failure, pulmonary hypertension, oxygen dependent COPD who was in his usual state of good health until 1:00 this morning when he woke from his recliner to go to the bathroom per his usual routine. Patient noted that he was short of breath on the way to the bathroom however was so short of breath on the way back that he had to stop twice in the kitchen before he got to his recliner which is very unusual for him. He called EMS. When EMS arrived he was noted to be in A. Fib with RVR and hypoxic and was placed on BiPAP and brought in for further treatment.  Initially placed on Cardizem drip which was discontinued after an hour as rate better controlled.  Patient placed on IV Lasix.     Assessment & Plan:   Principal Problem:   Acute on chronic respiratory failure with hypoxia (HCC) Active Problems:   Atrial fibrillation with RVR (HCC)   Diastolic CHF, acute on chronic (HCC)   Essential hypertension   Congestive heart failure ([place])   Long term current use of anticoagulant therapy   HLD (hyperlipidemia)   Leukocytosis   Mitral stenosis   SOB (shortness of breath)   Hyperglycemia   COPD with hypoxia (HCC)   Elevated troponin  #1 acute on chronic respiratory failure with hypoxia secondary to acute on chronic diastolic heart failure and A. 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  On admission was placed on the BiPAP however after given Lasix 80 mg IV x1 as well as placed on the Cardizem drip some improvement in respiratory status and patient subsequently transitioned off the BiPAP.  Patient currently off the BiPAP and on high flow nasal cannula at 3-4 L.  Cardiac enzymes which was cycled were elevated at  1.01, 1.01, 0.69.  BNP was 270.4.  Patient with a urine output of 2.025 L over the past 24 hours.  Patient is -2.11 L during this hospitalization.  Current weight is 144 pounds from 142 pounds from 158 pounds on admission.  Doubt if weights are correct.  Patient still volume overloaded on examination.  Continue Lasix 40 mg IV every 12 hours, Lipitor, Toprol-XL.  Strict I's and O's.  Daily weights.  Cardiology following.    2.  Elevated troponins Questionable etiology.  May be secondary to acute coronary syndrome versus secondary to acute CHF exacerbation.  Patient denies any acute chest pain.  EKG with A. fib with no ischemic changes noted.  Continue diuresis.  Patient with recent 2D echo 07/18/2017 with a EF of 65-70%, no wall motion abnormalities, mild aortic valvular stenosis, severe mitral valvular stenosis, moderate to severe tricuspid valvular regurgitation, PA peak pressure of 60 mmHg.  Continue IV diuretics, statin, beta-blocker.  Patient seen in consultation by cardiology who feel no further invasive workup needed at this time.  Per cardiology.   3.  Mitral valvular stenosis/a mild aortic valvular stenosis/moderate to severe tricuspid valvular regurgitation/pulmonary hypertension Chronic in nature.  Continue current cardiac medications of diuretics, statin, beta-blocker.  Cardiology following.    4.  COPD Currently stable.  Patient with no further wheezing after diuresis. Continue Atrovent and Xopenex nebs, Claritin.  Follow.  5.  Hypertension Pressure well controlled  on current regimen of diuretics and Lopressor.   6.  A. fib with RVR Patient on admission was noted to be in A. fib with RVR and placed on the Cardizem drip for an hour and subsequently drip discontinued.  Rate is currently controlled on home regimen of metoprolol.  INR is 2.20.  Coumadin for anticoagulation per pharmacy.  7.  Hyperlipidemia Continue statin.  8.  Leukocytosis Likely a reactive leukocytosis.  Chest x-ray  consistent with volume overload and not acute infiltrate.  Patient afebrile.  This is now complaining of some lower abdominal pain with urination.  Leukocytosis of 15.4 from 12.4 from 20.4 on admission.  Check a UA with cultures and sensitivities.  Patient currently afebrile.  Hold off on antibiotics at this time.   9 elevated blood sugar Hemoglobin A1c of 6.2.  CBGs ranging from 106-163.  Diet controlled.  Continue sliding scale insulin.  Outpatient follow-up with PCP.    DVT prophylaxis: Coumadin Code Status: Full Family Communication: updated patient.  No family at bedside. Disposition Plan: Transfer to telemetry.  Home with home health once acute CHF exacerbation has resolved, clinical improvement and per cardiology hopefully in the next 24-48 hours.    Consultants:   Cardiology: Dr. Johnsie Cancel 10/20/2017   Procedures:   Chest x-ray 10/19/2017, 10/20/2017  Antimicrobials:   None   Subjective: Patient states shortness of breath has improved.  Off BiPAP.  Denies any chest pain.  Complaining of some lower abdominal pain whenever he urinates.  Patient with some complaints of sore throat when he swallows.  Objective: Vitals:   10/21/17 0326 10/21/17 0800 10/21/17 0815 10/21/17 0830  BP: 120/69 128/72    Pulse: 72 78 78 80  Resp: 20 (!) 36 (!) 36 19  Temp: 98.4 F (36.9 C) 98 F (36.7 C)    TempSrc: Oral Oral    SpO2: 96% 99% 96% 98%  Weight: 65.5 kg (144 lb 6.4 oz)     Height:        Intake/Output Summary (Last 24 hours) at 10/21/2017 1025 Last data filed at 10/21/2017 0300 Gross per 24 hour  Intake 582 ml  Output 2025 ml  Net -1443 ml   Filed Weights   10/19/17 1609 10/20/17 0425 10/21/17 0326  Weight: 64.9 kg (143 lb) 64.6 kg (142 lb 6.7 oz) 65.5 kg (144 lb 6.4 oz)    Examination:  General exam: On 4 L high flow nasal cannula Respiratory system: Crackles noted diffusely.  No expiratory wheezing.  No rhonchi.    Cardiovascular system: Irregularly irregular.   Positive JVD.  No lower extremity edema.  No murmurs rubs or gallops.  Gastrointestinal system: Abdomen is soft, nontender, nondistended, positive bowel sounds.  Central nervous system: Alert and oriented. No focal neurological deficits. Extremities: Symmetric 5 x 5 power. Skin: No rashes, lesions or ulcers Psychiatry: Judgement and insight appear normal. Mood & affect appropriate.     Data Reviewed: I have personally reviewed following labs and imaging studies  CBC: Recent Labs  Lab 10/19/17 0308 10/20/17 0234 10/21/17 0327  WBC 20.4* 12.4* 15.4*  HGB 11.7* 9.9* 10.8*  HCT 38.5* 32.4* 34.9*  MCV 78.9 77.5* 77.4*  PLT 227 167 627   Basic Metabolic Panel: Recent Labs  Lab 10/19/17 0308 10/20/17 0234 10/21/17 0327  NA 135 138 136  K 4.5 4.1 4.1  CL 99* 102 100*  CO2 23 27 29   GLUCOSE 281* 106* 110*  BUN 22* 26* 25*  CREATININE 1.02 0.93 0.88  CALCIUM  8.8* 8.4* 8.5*  MG  --   --  2.3   GFR: Estimated Creatinine Clearance: 51.7 mL/min (by C-G formula based on SCr of 0.88 mg/dL). Liver Function Tests: Recent Labs  Lab 10/20/17 0234  AST 23  ALT 18  ALKPHOS 84  BILITOT 1.4*  PROT 6.4*  ALBUMIN 2.8*   No results for input(s): LIPASE, AMYLASE in the last 168 hours. No results for input(s): AMMONIA in the last 168 hours. Coagulation Profile: Recent Labs  Lab 10/19/17 0309 10/20/17 0234 10/21/17 0327  INR 2.45 2.34 2.20   Cardiac Enzymes: Recent Labs  Lab 10/19/17 0935 10/19/17 1434 10/19/17 2040  TROPONINI 1.01* 1.01* 0.69*   BNP (last 3 results) No results for input(s): PROBNP in the last 8760 hours. HbA1C: Recent Labs    10/19/17 0935  HGBA1C 6.2*   CBG: Recent Labs  Lab 10/20/17 0604 10/20/17 1141 10/20/17 1626 10/20/17 2114 10/21/17 0614  GLUCAP 109* 114* 163* 131* 106*   Lipid Profile: No results for input(s): CHOL, HDL, LDLCALC, TRIG, CHOLHDL, LDLDIRECT in the last 72 hours. Thyroid Function Tests: No results for input(s): TSH,  T4TOTAL, FREET4, T3FREE, THYROIDAB in the last 72 hours. Anemia Panel: No results for input(s): VITAMINB12, FOLATE, FERRITIN, TIBC, IRON, RETICCTPCT in the last 72 hours. Sepsis Labs: No results for input(s): PROCALCITON, LATICACIDVEN in the last 168 hours.  No results found for this or any previous visit (from the past 240 hour(s)).       Radiology Studies: X-ray Chest Pa And Lateral  Result Date: 10/20/2017 CLINICAL DATA:  Shortness of breath on exertion. EXAM: CHEST - 2 VIEW COMPARISON:  10/19/2017. FINDINGS: Stable enlarged cardiac silhouette diffusely prominent pulmonary vasculature and interstitial markings with significantly less bilateral airspace opacity. Increased right pleural fluid. Small left pleural effusion. No acute bony abnormality. IMPRESSION: 1. Significantly improved bilateral alveolar edema. 2. Bilateral pleural effusions, mildly increased. 3. Cardiomegaly, pulmonary vascular congestion and some residual interstitial pulmonary edema. Electronically Signed   By: Claudie Revering M.D.   On: 10/20/2017 09:40        Scheduled Meds: . atorvastatin  40 mg Oral q1800  . furosemide  40 mg Intravenous BID  . insulin aspart  0-9 Units Subcutaneous TID WC  . ipratropium  0.5 mg Nebulization TID  . levalbuterol  1.25 mg Nebulization TID  . loratadine  10 mg Oral Daily  . metoprolol tartrate  25 mg Oral TID  . potassium chloride  10 mEq Oral BID  . [START ON 10/22/2017] warfarin  5 mg Oral Q T,Th,Sat-1800  . warfarin  5 mg Oral Q M,W,F-1800  . [START ON 10/27/2017] warfarin  7.5 mg Oral Q Sun  . Warfarin - Pharmacist Dosing Inpatient   Does not apply q1800   Continuous Infusions:   LOS: 2 days    Time spent: 40 minutes    Irine Seal, MD [facility] Pager [phone]  If 7PM-7AM, please contact night-coverage [email or web address] Password [facility] 10/21/2017, 10:25 AM

## 2017-10-21 NOTE — Evaluation (Signed)
Occupational Therapy Evaluation Patient Details Name: Aaron Frye MRN: [id number] DOB: 17-Oct-1924 Today's Date: 10/21/2017    History of Present Illness Pt admitted with acute on chronic respiratory failure due to acute on chronic heart failure and Atrial Fibrillation with RVR. PMH - rt femur fx, O2 dependent COPD, diastolic heart failure.   Clinical Impression   PTA, pt was independent with ADL and functional mobility. He lives with his wife who is not able to physically assist him with ADL. Pt currently requiring min guard assist for toilet transfers with RW, supervision for LB ADL, and min assist for walk-in shower transfers with RW. Pt demonstrated dyspnea 2/4 on exertion but SpO2 sustained >90% during ADL participation on 2L O2. Pt's daughter present during session and she will be able to provide some assistance post-acute D/C. Pt would benefit from continued OT services while admitted to improve independence and safety with ADL and functional mobility prior to returning home. Recommend home health OT follow-up as well as initial 24 hour assistance. Will continue to follow while admitted.    Follow Up Recommendations  Home health OT;Supervision/Assistance - 24 hour    Equipment Recommendations  3 in 1 bedside commode    Recommendations for Other Services       Precautions / Restrictions Precautions Precautions: Fall Restrictions Weight Bearing Restrictions: No      Mobility Bed Mobility               General bed mobility comments: OOB in recliner on my arrival  Transfers Overall transfer level: Needs assistance Equipment used: None Transfers: Sit to/from Stand Sit to Stand: Min guard;Supervision         General transfer comment: Supervision for safety to power up to standing. Min guard assist once ambulating.     Balance Overall balance assessment: Needs assistance Sitting-balance support: No upper extremity supported;Feet supported Sitting balance-Leahy  Scale: Good     Standing balance support: No upper extremity supported;During functional activity Standing balance-Leahy Scale: Fair Standing balance comment: Statically able to stand without UE support. Requires UE support for dynamic tasks.                            ADL either performed or assessed with clinical judgement   ADL Overall ADL's : Needs assistance/impaired Eating/Feeding: Set up;Sitting   Grooming: Supervision/safety;Standing   Upper Body Bathing: Set up;Sitting   Lower Body Bathing: Supervison/ safety;Sit to/from stand   Upper Body Dressing : Set up;Sitting   Lower Body Dressing: Supervision/safety;Sit to/from stand   Toilet Transfer: Min guard;Ambulation;RW   Toileting- Clothing Manipulation and Hygiene: Supervision/safety;Sit to/from stand   Tub/ Shower Transfer: Minimal assistance;Rolling walker;Grab bars;Walk-in shower   Functional mobility during ADLs: Min guard;Rolling walker General ADL Comments: Pt with decreased activity tolerance for ADL and dyspnea 2/4 throughout ADL.      Vision Patient Visual Report: No change from baseline Vision Assessment?: No apparent visual deficits     Perception     Praxis      Pertinent Vitals/Pain Pain Assessment: No/denies pain     Hand Dominance Right   Extremity/Trunk Assessment Upper Extremity Assessment Upper Extremity Assessment: Generalized weakness   Lower Extremity Assessment Lower Extremity Assessment: Generalized weakness       Communication Communication Communication: HOH   Cognition Arousal/Alertness: Awake/alert Behavior During Therapy: WFL for tasks assessed/performed Overall Cognitive Status: Within Functional Limits for tasks assessed  General Comments       Exercises     Shoulder Instructions      Home Living Family/patient expects to be discharged to:: Private residence Living Arrangements: Spouse/significant  other Available Help at Discharge: Family;Available 24 hours/day(wife unable to physically assist; daughter present at times) Type of Home: House Home Access: [place]: One level     Bathroom Shower/Tub: Occupational psychologist: Standard     Home Equipment: Environmental consultant - 2 wheels;Shower seat;Grab bars - tub/shower   Additional Comments: wife using RW      Prior Functioning/Environment Level of Independence: Independent                 OT Problem List: Decreased strength;Decreased range of motion;Decreased activity tolerance;Impaired balance (sitting and/or standing);Decreased safety awareness;Decreased knowledge of use of DME or AE;Decreased knowledge of precautions;Pain      OT Treatment/Interventions: Self-care/ADL training;Therapeutic exercise;Energy conservation;DME and/or AE instruction;Therapeutic activities;Patient/family education;Balance training    OT Goals(Current goals can be found in the care plan section) Acute Rehab OT Goals Patient Stated Goal: return home OT Goal Formulation: With patient/family Time For Goal Achievement: 11/04/17 Potential to Achieve Goals: Good ADL Goals Pt Will Perform Grooming: with modified independence;standing Pt Will Perform Lower Body Dressing: with modified independence;sit to/from stand Pt Will Transfer to Toilet: with modified independence;ambulating;bedside commode(BSC over toilet) Pt Will Perform Toileting - Clothing Manipulation and hygiene: with modified independence;sit to/from stand Pt Will Perform Tub/Shower Transfer: Shower transfer;with modified independence;ambulating;grab bars;shower seat;rolling walker  OT Frequency: Min 2X/week   Barriers to D/C:            Co-evaluation              AM-PAC PT "6 Clicks" Daily Activity     Outcome Measure Help from another person eating meals?: None Help from another person taking care of personal grooming?: A Little Help from another  person toileting, which includes using toliet, bedpan, or urinal?: A Little Help from another person bathing (including washing, rinsing, drying)?: A Little Help from another person to put on and taking off regular upper body clothing?: A Little Help from another person to put on and taking off regular lower body clothing?: A Little 6 Click Score: 19   End of Session Equipment Utilized During Treatment: Gait belt;Rolling walker Nurse Communication: Mobility status;Other (comment)(nurse and nurse tech - condom cath nearly off)  Activity Tolerance: Patient tolerated treatment well Patient left: in chair;with family/visitor present;with call bell/phone within reach  OT Visit Diagnosis: Other abnormalities of gait and mobility (R26.89)                Time: 9390-3009 OT Time Calculation (min): 38 min Charges:  OT General Charges $OT Visit: 1 Visit OT Evaluation $OT Eval Moderate Complexity: 1 Mod OT Treatments $Self Care/Home Management : 23-37 mins G-Codes:     Norman Herrlich, MS OTR/L  Pager: Belen A Jamontae Thwaites 10/21/2017, 5:53 PM

## 2017-10-22 DIAGNOSIS — J9601 Acute respiratory failure with hypoxia: Secondary | ICD-10-CM

## 2017-10-22 DIAGNOSIS — J9611 Chronic respiratory failure with hypoxia: Secondary | ICD-10-CM

## 2017-10-22 DIAGNOSIS — N390 Urinary tract infection, site not specified: Secondary | ICD-10-CM

## 2017-10-22 LAB — CBC
HEMATOCRIT: 36.6 % — AB (ref 39.0–52.0)
HEMOGLOBIN: 11.1 g/dL — AB (ref 13.0–17.0)
MCH: 23.9 pg — ABNORMAL LOW (ref 26.0–34.0)
MCHC: 30.3 g/dL (ref 30.0–36.0)
MCV: 78.9 fL (ref 78.0–100.0)
Platelets: 207 10*3/uL (ref 150–400)
RBC: 4.64 MIL/uL (ref 4.22–5.81)
RDW: 17.7 % — ABNORMAL HIGH (ref 11.5–15.5)
WBC: 13.9 10*3/uL — ABNORMAL HIGH (ref 4.0–10.5)

## 2017-10-22 LAB — BASIC METABOLIC PANEL
Anion gap: 10 (ref 5–15)
BUN: 25 mg/dL — ABNORMAL HIGH (ref 6–20)
CHLORIDE: 99 mmol/L — AB (ref 101–111)
CO2: 29 mmol/L (ref 22–32)
CREATININE: 0.87 mg/dL (ref 0.61–1.24)
Calcium: 8.7 mg/dL — ABNORMAL LOW (ref 8.9–10.3)
GFR calc Af Amer: >60 mL/min (ref >60)
GFR calc non Af Amer: >60 mL/min (ref >60)
Glucose, Bld: 94 mg/dL (ref 65–99)
POTASSIUM: 3.9 mmol/L (ref 3.5–5.1)
SODIUM: 138 mmol/L (ref 135–145)

## 2017-10-22 LAB — GLUCOSE, CAPILLARY
GLUCOSE-CAPILLARY: 105 mg/dL — AB (ref 65–99)
GLUCOSE-CAPILLARY: 154 mg/dL — AB (ref 65–99)
Glucose-Capillary: 165 mg/dL — ABNORMAL HIGH (ref 65–99)
Glucose-Capillary: 128 mg/dL — ABNORMAL HIGH (ref 65–99)

## 2017-10-22 LAB — PROTIME-INR
INR: 2.15
PROTHROMBIN TIME: 23.9 s — AB (ref 11.4–15.2)

## 2017-10-22 MED ORDER — WARFARIN SODIUM 5 MG PO TABS
5.0000 mg | ORAL_TABLET | Freq: Once | ORAL | Status: AC
  Administered 2017-10-22: 5 mg via ORAL
  Filled 2017-10-22: qty 1

## 2017-10-22 MED ORDER — IPRATROPIUM BROMIDE 0.02 % IN SOLN: 0.5000 mg | Freq: Four times a day (QID) | RESPIRATORY_TRACT | Status: DC | PRN

## 2017-10-22 MED ORDER — SODIUM CHLORIDE 0.9 % IV SOLN
1.0000 g | INTRAVENOUS | Status: DC
  Administered 2017-10-22: 1 g via INTRAVENOUS
  Filled 2017-10-22 (×2): qty 10

## 2017-10-22 NOTE — Progress Notes (Signed)
PROGRESS NOTE    Aaron Frye  HKV:[id number] DOB: 1924-07-20 DOA: 10/19/2017 PCP: Stephens Shire, MD    Brief Narrative:  Aaron Frye is an 84 y.o. male with past medical history significant for atrial fibrillation, diastolic heart failure, pulmonary hypertension, oxygen dependent COPD who was in his usual state of good health until 1:00 this morning when he woke from his recliner to go to the bathroom per his usual routine. Patient noted that he was short of breath on the way to the bathroom however was so short of breath on the way back that he had to stop twice in the kitchen before he got to his recliner which is very unusual for him. He called EMS. When EMS arrived he was noted to be in A. Fib with RVR and hypoxic and was placed on BiPAP and brought in for further treatment.  Initially placed on Cardizem drip which was discontinued after an hour as rate better controlled.  Patient placed on IV Lasix.     Assessment & Plan:   Principal Problem:   Acute on chronic respiratory failure with hypoxia (HCC) Active Problems:   Atrial fibrillation with RVR (HCC)   Diastolic CHF, acute on chronic (HCC)   Essential hypertension   Congestive heart failure ([place])   Long term current use of anticoagulant therapy   HLD (hyperlipidemia)   Leukocytosis   Mitral stenosis   SOB (shortness of breath)   Hyperglycemia   COPD with hypoxia (HCC)   Elevated troponin   Acute lower UTI  #1 acute on chronic respiratory failure with hypoxia secondary to acute on chronic diastolic heart failure and A. 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  On admission was placed on the BiPAP however after given Lasix 80 mg IV x1 as well as placed on the Cardizem drip some improvement in respiratory status and patient subsequently transitioned off the BiPAP.  Patient currently off the BiPAP and on high flow nasal cannula at 2L.  Cardiac enzymes which was cycled  were elevated at 1.01, 1.01, 0.69.  BNP was 270.4.  Patient with a urine output of 0.7 L over the past 24 hours.  Patient is -1.951 L during this hospitalization.  Current weight is 138 pounds from 144 pounds from 142 pounds from 158 pounds on admission.  Doubt if weights are correct.  Patient still volume overloaded on examination.  Continue Lasix 40 mg IV every 12 hours, Lipitor, Toprol-XL.  Strict I's and O's.  Daily weights.  Cardiology following.    2.  Elevated troponins Questionable etiology.  May be secondary to acute coronary syndrome versus secondary to acute CHF exacerbation.  Patient denies any acute chest pain.  EKG with A. fib with no ischemic changes noted.  Continue diuresis.  Patient with recent 2D echo 07/18/2017 with a EF of 65-70%, no wall motion abnormalities, mild aortic valvular stenosis, severe mitral valvular stenosis, moderate to severe tricuspid valvular regurgitation, PA peak pressure of 60 mmHg.  Continue current regimen of IV diuretics, statin, beta-blocker.  Per cardiology no further invasive workup needed at this time.   Per cardiology.   3.  Mitral valvular stenosis/a mild aortic valvular stenosis/moderate to severe tricuspid valvular regurgitation/pulmonary hypertension Chronic in nature.  Continue current cardiac medications of diuretics, statin, beta-blocker.  Cardiology following.    4.  COPD Currently stable.  Patient with no further wheezing after diuresis. Continue Atrovent and Xopenex nebs, Claritin.  Follow.  5.  Hypertension Controlled on current regimen of diuretics and Lopressor.   6.  A. fib with RVR Patient on admission was noted to be in A. fib with RVR and placed on the Cardizem drip for an hour and subsequently drip discontinued.  Metoprolol dose increased to 3 times daily per cardiology.  Heart rate improved.  INR is 2.15.  Continue current medications.  Coumadin for anticoagulation.    7.  Hyperlipidemia Continue statin.  8.  Leukocytosis Likely  secondary to UTI and a reactive leukocytosis.  Chest x-ray consistent with volume overload and not acute infiltrate.  Patient afebrile.  Patient was complaining of lower abdominal pain with urination.  Leukocytosis trending down currently at 13.9 from 15.4 from 12.4 from 20.4 on admission.  Urinalysis consistent with UTI.  Urine cultures pending.  Start IV Rocephin.   9 elevated blood sugar Hemoglobin A1c of 6.2.  CBGs ranging from 105-165.  Diet controlled.  Continue sliding scale insulin.  Outpatient follow-up with PCP.  Graft #  10 probable UTI Patient with complaints of intermittent lower abdominal discomfort with urination.  Urinalysis which was done consistent with a UTI.  Patient with a leukocytosis.  Start empiric IV Rocephin while cultures are pending.    DVT prophylaxis: Coumadin Code Status: Full Family Communication: updated patient.  No family at bedside. Disposition Plan: Home with home health once acute CHF exacerbation has resolved, clinical improvement and per cardiology hopefully in the next 48-72 hours.    Consultants:   Cardiology: Dr. Johnsie Cancel 10/20/2017   Procedures:   Chest x-ray 10/19/2017, 10/20/2017  Antimicrobials:   IV Rocephin 10/22/2017   Subjective: Patient in bed.  Feels shortness of breath is improving daily however not at baseline.  No chest pain.  Still with some intermittent lower abdominal pain when he urinates.  Objective: Vitals:   10/22/17 0315 10/22/17 0327 10/22/17 0723 10/22/17 0819  BP: 112/69   124/69  Pulse: 73   88  Resp: 15     Temp: 98.1 F (36.7 C)   98.4 F (36.9 C)  TempSrc: Oral   Oral  SpO2: 99%  97% 96%  Weight:  63 kg (138 lb 14.4 oz)    Height:        Intake/Output Summary (Last 24 hours) at 10/22/2017 1122 Last data filed at 10/22/2017 0828 Gross per 24 hour  Intake 1060 ml  Output 900 ml  Net 160 ml   Filed Weights   10/20/17 0425 10/21/17 0326 10/22/17 0327  Weight: 64.6 kg (142 lb 6.7 oz) 65.5 kg (144 lb 6.4  oz) 63 kg (138 lb 14.4 oz)    Examination:  General exam: On 2 L nasal cannula Respiratory system: Bibasilar crackles noted.  No wheezing.  No rhonchi.   Cardiovascular system: Irregularly irregular.  Positive JVD.  No lower extremity edema.  No murmurs rubs or gallops. Gastrointestinal system: Abdomen is tender, nondistended, soft, positive bowel sounds.   Central nervous system: Alert and oriented x3.  Cranial nerves II through XII grossly intact. No focal neurological deficits. Extremities: Symmetric 5 x 5 power. Skin: No rashes, lesions or ulcers Psychiatry: Judgement and insight appear normal. Mood & affect appropriate.     Data Reviewed: I have personally reviewed following labs and imaging studies  CBC: Recent Labs  Lab 10/19/17 0308 10/20/17 0234 10/21/17 0327 10/22/17 0313  WBC 20.4* 12.4* 15.4* 13.9*  HGB 11.7* 9.9* 10.8* 11.1*  HCT 38.5* 32.4* 34.9* 36.6*  MCV 78.9 77.5* 77.4* 78.9  PLT 227 167  189 330   Basic Metabolic Panel: Recent Labs  Lab 10/19/17 0308 10/20/17 0234 10/21/17 0327 10/22/17 0313  NA 135 138 136 138  K 4.5 4.1 4.1 3.9  CL 99* 102 100* 99*  CO2 23 27 29 29   GLUCOSE 281* 106* 110* 94  BUN 22* 26* 25* 25*  CREATININE 1.02 0.93 0.88 0.87  CALCIUM 8.8* 8.4* 8.5* 8.7*  MG  --   --  2.3  --    GFR: Estimated Creatinine Clearance: 50.3 mL/min (by C-G formula based on SCr of 0.87 mg/dL). Liver Function Tests: Recent Labs  Lab 10/20/17 0234  AST 23  ALT 18  ALKPHOS 84  BILITOT 1.4*  PROT 6.4*  ALBUMIN 2.8*   No results for input(s): LIPASE, AMYLASE in the last 168 hours. No results for input(s): AMMONIA in the last 168 hours. Coagulation Profile: Recent Labs  Lab 10/19/17 0309 10/20/17 0234 10/21/17 0327 10/22/17 0313  INR 2.45 2.34 2.20 2.15   Cardiac Enzymes: Recent Labs  Lab 10/19/17 0935 10/19/17 1434 10/19/17 2040  TROPONINI 1.01* 1.01* 0.69*   BNP (last 3 results) No results for input(s): PROBNP in the last  8760 hours. HbA1C: No results for input(s): HGBA1C in the last 72 hours. CBG: Recent Labs  Lab 10/21/17 1123 10/21/17 1619 10/21/17 2053 10/22/17 0619 10/22/17 1108  GLUCAP 163* 104* 113* 105* 165*   Lipid Profile: No results for input(s): CHOL, HDL, LDLCALC, TRIG, CHOLHDL, LDLDIRECT in the last 72 hours. Thyroid Function Tests: No results for input(s): TSH, T4TOTAL, FREET4, T3FREE, THYROIDAB in the last 72 hours. Anemia Panel: No results for input(s): VITAMINB12, FOLATE, FERRITIN, TIBC, IRON, RETICCTPCT in the last 72 hours. Sepsis Labs: No results for input(s): PROCALCITON, LATICACIDVEN in the last 168 hours.  Recent Results (from the past 240 hour(s))  Urine Culture     Status: Abnormal (Preliminary result)   Collection Time: 10/21/17 10:22 AM  Result Value Ref Range Status   Specimen Description URINE, CLEAN CATCH  Final   Special Requests NONE  Final   Culture (A)  Final    >=100,000 COLONIES/mL GRAM NEGATIVE RODS IDENTIFICATION AND SUSCEPTIBILITIES TO FOLLOW Performed at [facility], [address] [place], [place] [address]    Report Status PENDING  Incomplete         Radiology Studies: No results found.      Scheduled Meds: . atorvastatin  40 mg Oral q1800  . furosemide  40 mg Intravenous BID  . insulin aspart  0-9 Units Subcutaneous TID WC  . loratadine  10 mg Oral Daily  . metoprolol tartrate  25 mg Oral TID  . multivitamin  1 tablet Oral Daily  . potassium chloride  10 mEq Oral BID  . warfarin  5 mg Oral ONCE-1800  . Warfarin - Pharmacist Dosing Inpatient   Does not apply q1800   Continuous Infusions: . cefTRIAXone (ROCEPHIN)  IV       LOS: 3 days    Time spent: 35 minutes    Irine Seal, MD [facility] Pager [phone]  If 7PM-7AM, please contact night-coverage [email or web address] Password [facility] 10/22/2017, 11:22 AM

## 2017-10-22 NOTE — Progress Notes (Addendum)
Occupational Therapy Treatment Patient Details Name: Aaron Frye MRN: [id number] DOB: [birth year] Today's Date: 10/22/2017    History of present illness Pt admitted with acute on chronic respiratory failure due to acute on chronic heart failure and Atrial Fibrillation with RVR. PMH - rt femur fx, O2 dependent copd, diastolic heart failure   OT comments  Pt presents sitting up in recliner, pleasant and willing to participate in OT tx session. Pt completing room level functional mobility using RW with MinGuard assist; completing standing grooming ADLs with supervision-MinGuard throughout. Pt able to stand at sink during ADL completion >5 minutes with SpO2 overall remaining above 95% on 2L, one instance of decreasing to 87% with ambulation back to chair, though quickly rebounding to above 90%. Continue to recommend [facility] services after discharge to maximize pt's safety and independence with ADLs and mobility. Will continue to follow acutely to progress pt towards established OT goals.    Follow Up Recommendations  Home health OT;Supervision/Assistance - 24 hour    Equipment Recommendations  3 in 1 bedside commode          Precautions / Restrictions Precautions Precautions: Fall Restrictions Weight Bearing Restrictions: No       Mobility Bed Mobility               General bed mobility comments: OOB in recliner on my arrival  Transfers Overall transfer level: Needs assistance Equipment used: None Transfers: Sit to/from Stand Sit to Stand: Min guard;Supervision         General transfer comment: Supervision for safety to power up to standing. Min guard assist once ambulating.     Balance Overall balance assessment: Needs assistance Sitting-balance support: No upper extremity supported;Feet supported Sitting balance-Leahy Scale: Good     Standing balance support: No upper extremity supported;During functional activity Standing balance-Leahy Scale: Fair Standing  balance comment: Statically able to stand without UE support. Requires UE support for dynamic tasks.                            ADL either performed or assessed with clinical judgement   ADL Overall ADL's : Needs assistance/impaired     Grooming: Supervision/safety;Standing;Oral care;Wash/dry face                               Functional mobility during ADLs: Min guard;Rolling walker General ADL Comments: Pt standing at sink >74min during grooming ADLs, dyspnea varying between 1-2/4; SpO2 overall remaining above 95% on 2L, one instance of dropping to 87% with ambulation back to recliner from sink, rebounding to above 90% very quickly (less than 30 sec)                       Cognition Arousal/Alertness: Awake/alert Behavior During Therapy: WFL for tasks assessed/performed Overall Cognitive Status: Within Functional Limits for tasks assessed                                                            Pertinent Vitals/ Pain       Pain Assessment: No/denies pain  Frequency  Min 2X/week        Progress Toward Goals  OT Goals(current goals can now be found in the care plan section)  Progress towards OT goals: Progressing toward goals  Acute Rehab OT Goals Patient Stated Goal: return home OT Goal Formulation: With patient/family Time For Goal Achievement: 11/04/17 Potential to Achieve Goals: Good  Plan Discharge plan remains appropriate                     AM-PAC PT "6 Clicks" Daily Activity     Outcome Measure   Help from another person eating meals?: None Help from another person taking care of personal grooming?: A Little Help from another person toileting, which includes using toliet, bedpan, or urinal?: A Little Help from another person bathing (including washing, rinsing, drying)?: A Little Help from another person to put  on and taking off regular upper body clothing?: A Little Help from another person to put on and taking off regular lower body clothing?: A Little 6 Click Score: 19    End of Session Equipment Utilized During Treatment: Gait belt;Rolling walker;Oxygen  OT Visit Diagnosis: Other abnormalities of gait and mobility (R26.89)   Activity Tolerance Patient tolerated treatment well   Patient Left in chair;with call bell/phone within reach;with family/visitor present;Other (comment)(handoff to PT for start of session )   Nurse Communication Mobility status        Time: 5790-3833 OT Time Calculation (min): 18 min  Charges: OT General Charges $OT Visit: 1 Visit OT Treatments $Self Care/Home Management : 8-22 mins  Aaron Frye, OT Pager [phone] 10/22/2017    Aaron Frye 10/22/2017, 4:14 PM

## 2017-10-22 NOTE — Progress Notes (Addendum)
Progress Note  Patient Name: Aaron Frye Date of Encounter: 10/22/2017  Primary Cardiologist: Jenkins Rouge, MD   Subjective   Pt breathing better, less swelling  Inpatient Medications    Scheduled Meds: . atorvastatin  40 mg Oral q1800  . furosemide  40 mg Intravenous BID  . insulin aspart  0-9 Units Subcutaneous TID WC  . ipratropium  0.5 mg Nebulization TID  . levalbuterol  1.25 mg Nebulization TID  . loratadine  10 mg Oral Daily  . metoprolol tartrate  25 mg Oral TID  . multivitamin  1 tablet Oral Daily  . potassium chloride  10 mEq Oral BID  . warfarin  5 mg Oral ONCE-1800  . Warfarin - Pharmacist Dosing Inpatient   Does not apply q1800   Continuous Infusions: . cefTRIAXone (ROCEPHIN)  IV     PRN Meds: acetaminophen **OR** acetaminophen, bisacodyl, ipratropium-albuterol, levalbuterol, phenol, pneumococcal 23 valent vaccine, polyethylene glycol   Vital Signs    Vitals:   10/22/17 0315 10/22/17 0327 10/22/17 0723 10/22/17 0819  BP: 112/69   124/69  Pulse: 73   88  Resp: 15     Temp: 98.1 F (36.7 C)   98.4 F (36.9 C)  TempSrc: Oral   Oral  SpO2: 99%  97% 96%  Weight:  138 lb 14.4 oz (63 kg)    Height:        Intake/Output Summary (Last 24 hours) at 10/22/2017 1015 Last data filed at 10/22/2017 0828 Gross per 24 hour  Intake 1060 ml  Output 900 ml  Net 160 ml   Filed Weights   10/20/17 0425 10/21/17 0326 10/22/17 0327  Weight: 142 lb 6.7 oz (64.6 kg) 144 lb 6.4 oz (65.5 kg) 138 lb 14.4 oz (63 kg)    Telemetry    Rate controlled AFib - Personally Reviewed  ECG    No new tracings - Personally Reviewed  Physical Exam   GEN: No acute distress.   Neck: No JVD, exam difficult Cardiac: irregular rhythm, regular rate , + murmur Respiratory: respirations unlabored, diminished in bases GI: Soft, nontender, non-distended  MS: No edema, chronic skin changes B LE; No deformity. Neuro:  Nonfocal  Psych: Normal affect   Labs     Chemistry Recent Labs  Lab 10/20/17 0234 10/21/17 0327 10/22/17 0313  NA 138 136 138  K 4.1 4.1 3.9  CL 102 100* 99*  CO2 27 29 29   GLUCOSE 106* 110* 94  BUN 26* 25* 25*  CREATININE 0.93 0.88 0.87  CALCIUM 8.4* 8.5* 8.7*  PROT 6.4*  --   --   ALBUMIN 2.8*  --   --   AST 23  --   --   ALT 18  --   --   ALKPHOS 84  --   --   BILITOT 1.4*  --   --   GFRNONAA >60 >60 >60  GFRAA >60 >60 >60  ANIONGAP 9 7 10      Hematology Recent Labs  Lab 10/20/17 0234 10/21/17 0327 10/22/17 0313  WBC 12.4* 15.4* 13.9*  RBC 4.18* 4.51 4.64  HGB 9.9* 10.8* 11.1*  HCT 32.4* 34.9* 36.6*  MCV 77.5* 77.4* 78.9  MCH 23.7* 23.9* 23.9*  MCHC 30.6 30.9 30.3  RDW 17.4* 17.7* 17.7*  PLT 167 189 207    Cardiac Enzymes Recent Labs  Lab 10/19/17 0935 10/19/17 1434 10/19/17 2040  TROPONINI 1.01* 1.01* 0.69*    Recent Labs  Lab 10/19/17 0308  TROPIPOC 0.00  BNP Recent Labs  Lab 10/19/17 0310  BNP 270.4*     DDimer No results for input(s): DDIMER in the last 168 hours.   Radiology    No results found.  Cardiac Studies   Echo 07/18/17: Study Conclusions - Left ventricle: The cavity size was normal. Wall thickness was   increased in a pattern of mild LVH. Systolic function was   vigorous. The estimated ejection fraction was in the range of 65%   to 70%. Wall motion was normal; there were no regional wall   motion abnormalities. - Aortic valve: Moderately calcified annulus. Severely thickened,   severely calcified leaflets. There was mild stenosis. - Mitral valve: Moderately dilated annulus. Severely calcified   leaflets . The findings are consistent with severe stenosis.   There was moderate regurgitation. Valve area by pressure   half-time: 1.68 cm^2. Valve area by continuity equation (using   LVOT flow): 0.7 cm^2. - Left atrium: The atrium was severely dilated. - Right ventricle: The cavity size was mildly dilated. Systolic   function was mildly reduced. - Right  atrium: The atrium was mildly to moderately dilated. - Tricuspid valve: There was moderate-severe regurgitation. - Pulmonary arteries: Systolic pressure was moderately to severely   increased. PA peak pressure: 60 mm Hg (S).  Patient Profile     84 y.o. male with chronic atrial fibrillation, chronic diastolic CHF, severe mitral stenosis, HTN, pulmHTN, COPD on 2L [place], who presented with DOE after power outage limited use of home O2 in the last 48 hours.   Assessment & Plan    1. Acute on chronic respiratory failure 2/2 acute on chronic diastolic heart failure - Diuresing  Now about 2 L negative   - CXR with pulmonary vascular congestion - O2 at home level - diuresed on 40 mg IV lasix BID - overall net negative 1.7 L, but no strict I&Os  - weight is 138 lbs from 158 lbs on admission - continue IV lasix today, nearing euvolemia   2. Elevated troponin - denies chest pain - Troponin trend mild and flat, likely secondary to CHF exacerbation and afib with  RVR - no ischemic evaluation planned   3. Atrial fibrillation - rate controlled - INR 2.15 - continue lopressor and coumadin   4. Severe mitral stenosis   5. HTN - pressures well-controlled  - continue lopressor   6. HLD - continue lipitor    For questions or updates, please contact [place] Please consult [email or web address] for contact info under Cardiology/STEMI.      Signed, Tami Lin Duke, PA  10/22/2017, 10:15 AM    Pt seen and examined   I agree with findings of A Duke above   I have amended this note above   Pt improving    On exam:  Lungs are CTA   Cardiac Irreg Irreg   No S3   Ext with triv edema  Continue diuresis for diastolic CHF Rate control and anticoagulate for atrial fibrillation   Pt's mitral stenosis makes him acutely sensiitive to rapid rates.  Dorris Carnes

## 2017-10-22 NOTE — Care Management Note (Signed)
Case Management Note Marvetta Gibbons RN, BSN Unit 4E-Case Manager [phone]  Patient Details  Name: BERDELL HOSTETLER MRN: [id number] Date of Birth: Apr 13, 1925  Subjective/Objective: Pt admitted with acute on chronic resp. Failure-  Afib,  HF                  Action/Plan: PTA Pt lived at home- was active with [facility] services- orders have been placed for HHRN/PT/OT/aide/se- DME order also placed for 3n1- will alert Tiffany with [facility] regarding [place] services- and also notify Jeneen Rinks with [facility] [place] for DME needs prior to discharge.   Expected Discharge Date:                  Expected Discharge Plan:  [place]  In-House Referral:  NA  Discharge planning Services  CM Consult  Post Acute Care Choice:  Durable Medical Equipment, Home Health, Resumption of Svcs/PTA Provider Choice offered to:  Patient  DME Arranged:  3-N-1 DME Agency:  [place]:  RN, Disease Management, PT, OT, Nurse's Aide, Social Work [employer] Agency:  [facility] [facility] [employer]  Status of Service:  In process, will continue to follow  If discussed at Long Length of Stay Meetings, dates discussed:    Discharge Disposition: home/home health   Additional Comments:  Dawayne Patricia, RN 10/22/2017, 1:31 PM

## 2017-10-22 NOTE — Progress Notes (Signed)
Physical Therapy Treatment Patient Details Name: Aaron Frye MRN: [id number] DOB: [birth year] Today's Date: 10/22/2017    History of Present Illness Pt admitted with acute on chronic respiratory failure due to acute on chronic heart failure and Atrial Fibrillation with RVR. PMH - rt femur fx, O2 dependent copd, diastolic heart failure    PT Comments    Patient is progressing very well towards their physical therapy goals. Ambulating 180 feet with RW and min guard assist on 3L [place]; maintained > 92% SpO2. Adjusted RW height for improved comfort and to decrease trunk flexion. Patient stating, "I need to move around more because I'm so weak." Will continue to follow to progress functional strengthening and improve endurance.      Follow Up Recommendations  Home health PT     Equipment Recommendations  Rolling walker with 5" wheels    Recommendations for Other Services       Precautions / Restrictions Precautions Precautions: Fall Restrictions Weight Bearing Restrictions: No    Mobility  Bed Mobility               General bed mobility comments: OOB in recliner on my arrival  Transfers Overall transfer level: Needs assistance Equipment used: None Transfers: Sit to/from Stand Sit to Stand: Supervision         General transfer comment: Supervision for safety to power up to standing.   Ambulation/Gait Ambulation/Gait assistance: Min guard Ambulation Distance (Feet): 180 Feet Assistive device: Rolling walker (2 wheeled) Gait Pattern/deviations: Step-through pattern;Decreased stride length;Trunk flexed Gait velocity: decr   General Gait Details: Min guard assist for safety. Patient ambulated on 2L [place], > 92% SpO2 throughout. VC's for RW proximity and looking up; patient not able to perform   Stairs             Wheelchair Mobility    Modified Rankin (Stroke Patients Only)       Balance Overall balance assessment: Needs assistance Sitting-balance  support: No upper extremity supported;Feet supported Sitting balance-Leahy Scale: Good     Standing balance support: No upper extremity supported;During functional activity Standing balance-Leahy Scale: Fair Standing balance comment: Statically able to stand without UE support. Requires UE support for dynamic tasks.                             Cognition Arousal/Alertness: Awake/alert Behavior During Therapy: WFL for tasks assessed/performed Overall Cognitive Status: Within Functional Limits for tasks assessed                                        Exercises      General Comments General comments (skin integrity, edema, etc.): Patient wife present throughout      Pertinent Vitals/Pain Pain Assessment: No/denies pain    Home Living                      Prior Function            PT Goals (current goals can now be found in the care plan section) Acute Rehab PT Goals Patient Stated Goal: return home PT Goal Formulation: With patient/family Time For Goal Achievement: 10/27/17 Potential to Achieve Goals: Good Progress towards PT goals: Progressing toward goals    Frequency    Min 3X/week      PT Plan Current plan remains appropriate  Co-evaluation              AM-PAC PT "6 Clicks" Daily Activity  Outcome Measure  Difficulty turning over in bed (including adjusting bedclothes, sheets and blankets)?: A Little Difficulty moving from lying on back to sitting on the side of the bed? : A Little Difficulty sitting down on and standing up from a chair with arms (e.g., wheelchair, bedside commode, etc,.)?: A Little Help needed moving to and from a bed to chair (including a wheelchair)?: A Little Help needed walking in hospital room?: A Little Help needed climbing 3-5 steps with a railing? : A Little 6 Click Score: 18    End of Session Equipment Utilized During Treatment: Oxygen;Gait belt Activity Tolerance: Patient limited  by fatigue Patient left: in chair;with call bell/phone within reach;with family/visitor present Nurse Communication: Mobility status PT Visit Diagnosis: Unsteadiness on feet (R26.81);Muscle weakness (generalized) (M62.81)     Time: 6270-3500 PT Time Calculation (min) (ACUTE ONLY): 19 min  Charges:  $Gait Training: 8-22 mins                    G Codes:       Ellamae Sia, PT, DPT Acute Rehabilitation Services  Pager: [phone]    Willy Eddy 10/22/2017, 4:18 PM

## 2017-10-22 NOTE — Progress Notes (Signed)
[place] for warfarin Indication: atrial fibrillation  Allergies  Allergen Reactions  . Penicillins Rash    Has patient had a PCN reaction causing immediate rash, facial/tongue/throat swelling, SOB or lightheadedness with hypotension: Yes Has patient had a PCN reaction causing severe rash involving mucus membranes or skin necrosis: Unk Has patient had a PCN reaction that required hospitalization: No Has patient had a PCN reaction occurring within the last 10 years: No If all of the above answers are "NO", then may proceed with Cephalosporin use.    Labs: Recent Labs    10/19/17 1434 10/19/17 2040  10/20/17 0234 10/21/17 0327 10/22/17 0313  HGB  --   --    < > 9.9* 10.8* 11.1*  HCT  --   --   --  32.4* 34.9* 36.6*  PLT  --   --   --  167 189 207  LABPROT  --   --   --  25.5* 24.2* 23.9*  INR  --   --   --  2.34 2.20 2.15  CREATININE  --   --   --  0.93 0.88 0.87  TROPONINI 1.01* 0.69*  --   --   --   --    < > = values in this interval not displayed.   Estimated Creatinine Clearance: 50.3 mL/min (by C-G formula based on SCr of 0.87 mg/dL).  Assessment: 84 YO male with Afib on warfarin PTA which he states is 5mg  once daily except Sundays, on Sundays he takes 7.5mg , and his last dose was 4/19 AM.  INR therapeutic on admission at 2.45 and remains in goal at 2.15 on current regimen.  Goal of Therapy:  INR 2-3 Monitor platelets by anticoagulation protocol: Yes   Plan:  Give warfarin 5mg  per home regimen Daily INR, CBC, s/s bleeding  Thank you Anette Guarneri, PharmD [phone] 10/22/2017 9:53 AM

## 2017-10-23 LAB — BASIC METABOLIC PANEL
Anion gap: 9 (ref 5–15)
BUN: 25 mg/dL — AB (ref 6–20)
CALCIUM: 8.7 mg/dL — AB (ref 8.9–10.3)
CHLORIDE: 100 mmol/L — AB (ref 101–111)
CO2: 32 mmol/L (ref 22–32)
CREATININE: 0.89 mg/dL (ref 0.61–1.24)
GFR calc non Af Amer: >60 mL/min (ref >60)
GLUCOSE: 119 mg/dL — AB (ref 65–99)
Potassium: 4.5 mmol/L (ref 3.5–5.1)
Sodium: 141 mmol/L (ref 135–145)

## 2017-10-23 LAB — GLUCOSE, CAPILLARY
GLUCOSE-CAPILLARY: 90 mg/dL (ref 65–99)
Glucose-Capillary: 116 mg/dL — ABNORMAL HIGH (ref 65–99)
Glucose-Capillary: 144 mg/dL — ABNORMAL HIGH (ref 65–99)
Glucose-Capillary: 106 mg/dL — ABNORMAL HIGH (ref 65–99)

## 2017-10-23 LAB — PROTIME-INR
INR: 2.21
Prothrombin Time: 24.4 seconds — ABNORMAL HIGH (ref 11.4–15.2)

## 2017-10-23 LAB — CBC
HEMATOCRIT: 36.7 % — AB (ref 39.0–52.0)
Hemoglobin: 11 g/dL — ABNORMAL LOW (ref 13.0–17.0)
MCH: 23.6 pg — AB (ref 26.0–34.0)
MCHC: 30 g/dL (ref 30.0–36.0)
MCV: 78.6 fL (ref 78.0–100.0)
Platelets: 222 10*3/uL (ref 150–400)
RBC: 4.67 MIL/uL (ref 4.22–5.81)
RDW: 17.3 % — AB (ref 11.5–15.5)
WBC: 11.4 10*3/uL — ABNORMAL HIGH (ref 4.0–10.5)

## 2017-10-23 LAB — URINE CULTURE

## 2017-10-23 MED ORDER — WARFARIN SODIUM 5 MG PO TABS
5.0000 mg | ORAL_TABLET | Freq: Once | ORAL | Status: AC
  Administered 2017-10-23: 5 mg via ORAL
  Filled 2017-10-23: qty 1

## 2017-10-23 MED ORDER — CEFDINIR 300 MG PO CAPS
300.0000 mg | ORAL_CAPSULE | Freq: Two times a day (BID) | ORAL | Status: DC
  Administered 2017-10-23 – 2017-10-24 (×3): 300 mg via ORAL
  Filled 2017-10-23 (×3): qty 1

## 2017-10-23 MED ORDER — FUROSEMIDE 40 MG PO TABS
40.0000 mg | ORAL_TABLET | Freq: Two times a day (BID) | ORAL | Status: DC
  Administered 2017-10-23 – 2017-10-24 (×2): 40 mg via ORAL
  Filled 2017-10-23 (×2): qty 1

## 2017-10-23 NOTE — Progress Notes (Signed)
PROGRESS NOTE    [name]  NWG:[id number] DOB: Nov 04, 1924 DOA: 10/19/2017 PCP: Stephens Shire, MD    Brief Narrative:  Aaron Frye is an 84 y.o. male with past medical history significant for atrial fibrillation, diastolic heart failure, pulmonary hypertension, oxygen dependent COPD presented with short of breath on the way to the bathroom however was so short of breath on the way back that he had to stop twice in the kitchen before he got to his recliner which is very unusual for him. He called EMS. When EMS arrived he was noted to be in A. Fib with RVR and hypoxic and was placed on BiPAP and brought in for further treatment.  Initially placed on Cardizem drip which was discontinued after an hour as rate better controlled.  Patient placed on IV Lasix. Still diuresing, cardiology following     Assessment & Plan:   Principal Problem:   Acute on chronic respiratory failure with hypoxia (HCC) Active Problems:   Essential hypertension   Atrial fibrillation with RVR (HCC)   Congestive heart failure (HCC)   Long term current use of anticoagulant therapy   HLD (hyperlipidemia)   Leukocytosis   Diastolic CHF, acute on chronic (HCC)   Mitral stenosis   SOB (shortness of breath)   Hyperglycemia   COPD with hypoxia (HCC)   Elevated troponin   Acute lower UTI   Acute respiratory failure with hypoxia (HCC)  #1 acute on chronic respiratory failure with hypoxia secondary to acute on chronic diastolic heart failure and A. 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  On admission was placed on the BiPAP however after given Lasix 80 mg IV x1 as well as placed on the Cardizem drip some improvement in respiratory status and patient subsequently transitioned off the BiPAP.  Patient currently off the BiPAP and on high flow nasal cannula at 2L.  Cardiac enzymes which was cycled were elevated at 1.01, 1.01, 0.69.  BNP was 270.4.   Patient currently  being diuresed with Lasix 40 mg IV every 12 hours, 1600 mL of urine output. Also on metoprolol. Diuretics to be adjusted by cardiology. Renal function stable    2.  Elevated troponins Questionable etiology.  May be secondary to acute coronary syndrome versus secondary to acute CHF exacerbation.  Patient denies any acute chest pain.  EKG with A. fib with no ischemic changes noted.  Continue diuresis.  Patient with recent 2D echo 07/18/2017 with a EF of 65-70%, no wall motion abnormalities, mild aortic valvular stenosis, severe mitral valvular stenosis, moderate to severe tricuspid valvular regurgitation, PA peak pressure of 60 mmHg.  Continue current regimen of IV diuretics, statin, beta-blocker.  No indication for invasive workup at this time   Per cardiology.   3.  Mitral valvular stenosis/a mild aortic valvular stenosis/moderate to severe tricuspid valvular regurgitation/pulmonary hypertension Chronic in nature.  Continue current cardiac medications of diuretics, statin, beta-blocker.  Cardiology following.    4.  COPD Currently stable.  Patient with no further wheezing after diuresis. Continue Atrovent and Xopenex nebs, Claritin.  Follow.  5.  Hypertension Controlled on current regimen of diuretics and Lopressor.   6.  A. fib with RVR Patient on admission was noted to be in A. fib with RVR and placed on the Cardizem drip for an hour and subsequently drip discontinued.  Metoprolol dose increased to 3 times daily per cardiology.  Heart rate improved.  .  Continue current medications.  Coumadin for anticoagulation.    7.  Hyperlipidemia Continue statin.  8.  Leukocytosis/UTI Likely secondary to UTI and a reactive leukocytosis.  Chest x-ray  No  acute infiltrate.  Patient afebrile.  Patient was complaining of lower abdominal pain with urination.  Leukocytosis trending down, 11.4 , down from 20.4.   Urinalysis consistent with UTI.  Urine cultures showed Escherichia coli greater than 100,000  colonies, pansensitive, will switch to oral Omnicef   5 days total.  9. elevated blood sugar Hemoglobin A1c of 6.2.  CBGs ranging from 105-165.  Diet controlled.  Continue sliding scale insulin.  Outpatient follow-up with PCP.  Graft #     DVT prophylaxis: Coumadin Code Status: Full Family Communication: updated patient.  No family at bedside. Disposition Plan: Currently on diuretics, IV Lasix, diuresing well, continue diuretics for the next 24-48 hours     Consultants:   Cardiology: Dr. Johnsie Cancel 10/20/2017   Procedures:   Chest x-ray 10/19/2017, 10/20/2017  Antimicrobials:   IV Rocephin 10/22/2017   Subjective:  HR controlled on bedside monitor , sbp soft , he is asx  Objective: Vitals:   10/22/17 2022 10/22/17 2235 10/23/17 0209 10/23/17 0427  BP: (!) 95/56 99/62  (!) 95/56  Pulse: 81 71  62  Resp: 19   (!) 28  Temp: 98.7 F (37.1 C)   98.9 F (37.2 C)  TempSrc: Oral   Oral  SpO2: 98%   99%  Weight:   66.1 kg (145 lb 11.6 oz)   Height:        Intake/Output Summary (Last 24 hours) at 10/23/2017 0820 Last data filed at 10/23/2017 0700 Gross per 24 hour  Intake 460 ml  Output 1600 ml  Net -1140 ml   Filed Weights   10/21/17 0326 10/22/17 0327 10/23/17 0209  Weight: 65.5 kg (144 lb 6.4 oz) 63 kg (138 lb 14.4 oz) 66.1 kg (145 lb 11.6 oz)    Examination:  General exam: On 2 L nasal cannula Respiratory system: Bibasilar crackles noted.  No wheezing.  No rhonchi.   Cardiovascular system: Irregularly irregular.  Positive JVD.  No lower extremity edema.  No murmurs rubs or gallops. Gastrointestinal system: Abdomen is tender, nondistended, soft, positive bowel sounds.   Central nervous system: Alert and oriented x3.  Cranial nerves II through XII grossly intact. No focal neurological deficits. Extremities: Symmetric 5 x 5 power. Skin: No rashes, lesions or ulcers Psychiatry: Judgement and insight appear normal. Mood & affect appropriate.     Data Reviewed: I have  personally reviewed following labs and imaging studies  CBC: Recent Labs  Lab 10/19/17 0308 10/20/17 0234 10/21/17 0327 10/22/17 0313 10/23/17 0256  WBC 20.4* 12.4* 15.4* 13.9* 11.4*  HGB 11.7* 9.9* 10.8* 11.1* 11.0*  HCT 38.5* 32.4* 34.9* 36.6* 36.7*  MCV 78.9 77.5* 77.4* 78.9 78.6  PLT 227 167 189 207 211   Basic Metabolic Panel: Recent Labs  Lab 10/19/17 0308 10/20/17 0234 10/21/17 0327 10/22/17 0313 10/23/17 0256  NA 135 138 136 138 141  K 4.5 4.1 4.1 3.9 4.5  CL 99* 102 100* 99* 100*  CO2 23 27 29 29  32  GLUCOSE 281* 106* 110* 94 119*  BUN 22* 26* 25* 25* 25*  CREATININE 1.02 0.93 0.88 0.87 0.89  CALCIUM 8.8* 8.4* 8.5* 8.7* 8.7*  MG  --   --  2.3  --   --    GFR: Estimated Creatinine Clearance: 51.6 mL/min (by C-G formula based on SCr of 0.89 mg/dL). Liver  Function Tests: Recent Labs  Lab 10/20/17 0234  AST 23  ALT 18  ALKPHOS 84  BILITOT 1.4*  PROT 6.4*  ALBUMIN 2.8*   No results for input(s): LIPASE, AMYLASE in the last 168 hours. No results for input(s): AMMONIA in the last 168 hours. Coagulation Profile: Recent Labs  Lab 10/19/17 0309 10/20/17 0234 10/21/17 0327 10/22/17 0313 10/23/17 0256  INR 2.45 2.34 2.20 2.15 2.21   Cardiac Enzymes: Recent Labs  Lab 10/19/17 0935 10/19/17 1434 10/19/17 2040  TROPONINI 1.01* 1.01* 0.69*   BNP (last 3 results) No results for input(s): PROBNP in the last 8760 hours. HbA1C: No results for input(s): HGBA1C in the last 72 hours. CBG: Recent Labs  Lab 10/22/17 0619 10/22/17 1108 10/22/17 1647 10/22/17 2126 10/23/17 0606  GLUCAP 105* 165* 128* 154* 116*   Lipid Profile: No results for input(s): CHOL, HDL, LDLCALC, TRIG, CHOLHDL, LDLDIRECT in the last 72 hours. Thyroid Function Tests: No results for input(s): TSH, T4TOTAL, FREET4, T3FREE, THYROIDAB in the last 72 hours. Anemia Panel: No results for input(s): VITAMINB12, FOLATE, FERRITIN, TIBC, IRON, RETICCTPCT in the last 72 hours. Sepsis  Labs: No results for input(s): PROCALCITON, LATICACIDVEN in the last 168 hours.  Recent Results (from the past 240 hour(s))  Urine Culture     Status: Abnormal   Collection Time: 10/21/17 10:22 AM  Result Value Ref Range Status   Specimen Description URINE, CLEAN CATCH  Final   Special Requests   Final    NONE Performed at [facility], [address] [place], [place] [address]    Culture >=100,000 COLONIES/mL ESCHERICHIA COLI (A)  Final   Report Status 10/23/2017 FINAL  Final   Organism ID, Bacteria ESCHERICHIA COLI (A)  Final      Susceptibility   Escherichia coli - MIC*    AMPICILLIN 8 SENSITIVE Sensitive     CEFAZOLIN <=4 SENSITIVE Sensitive     CEFTRIAXONE <=1 SENSITIVE Sensitive     CIPROFLOXACIN <=0.25 SENSITIVE Sensitive     GENTAMICIN <=1 SENSITIVE Sensitive     IMIPENEM <=0.25 SENSITIVE Sensitive     NITROFURANTOIN <=16 SENSITIVE Sensitive     TRIMETH/SULFA <=20 SENSITIVE Sensitive     AMPICILLIN/SULBACTAM <=2 SENSITIVE Sensitive     PIP/TAZO <=4 SENSITIVE Sensitive     Extended ESBL NEGATIVE Sensitive     * >=100,000 COLONIES/mL ESCHERICHIA COLI         Radiology Studies: No results found.      Scheduled Meds: . atorvastatin  40 mg Oral q1800  . furosemide  40 mg Intravenous BID  . insulin aspart  0-9 Units Subcutaneous TID WC  . loratadine  10 mg Oral Daily  . metoprolol tartrate  25 mg Oral TID  . multivitamin  1 tablet Oral Daily  . potassium chloride  10 mEq Oral BID  . Warfarin - Pharmacist Dosing Inpatient   Does not apply q1800   Continuous Infusions: . cefTRIAXone (ROCEPHIN)  IV Stopped (10/22/17 1206)     LOS: 4 days    Time spent: 35 minutes    Reyne Dumas, MD [facility] Pager [phone]  If 7PM-7AM, please contact night-coverage [email or web address] Password [email or web address] 10/23/2017, 8:20 AM

## 2017-10-23 NOTE — Progress Notes (Signed)
Progress Note  Patient Name: Aaron Frye Date of Encounter: 10/23/2017  Primary Cardiologist: Jenkins Rouge, MD   Subjective   Breathing is OK   No CP    Inpatient Medications    Scheduled Meds: . atorvastatin  40 mg Oral q1800  . cefdinir  300 mg Oral Q12H  . furosemide  40 mg Intravenous BID  . insulin aspart  0-9 Units Subcutaneous TID WC  . loratadine  10 mg Oral Daily  . metoprolol tartrate  25 mg Oral TID  . multivitamin  1 tablet Oral Daily  . potassium chloride  10 mEq Oral BID  . Warfarin - Pharmacist Dosing Inpatient   Does not apply q1800   Continuous Infusions:  PRN Meds: acetaminophen **OR** acetaminophen, bisacodyl, ipratropium, levalbuterol, phenol, pneumococcal 23 valent vaccine, polyethylene glycol   Vital Signs    Vitals:   10/22/17 2022 10/22/17 2235 10/23/17 0209 10/23/17 0427  BP: (!) 95/56 99/62  (!) 95/56  Pulse: 81 71  62  Resp: 19   (!) 28  Temp: 98.7 F (37.1 C)   98.9 F (37.2 C)  TempSrc: Oral   Oral  SpO2: 98%   99%  Weight:   145 lb 11.6 oz (66.1 kg)   Height:        Intake/Output Summary (Last 24 hours) at 10/23/2017 0926 Last data filed at 10/23/2017 0700 Gross per 24 hour  Intake 460 ml  Output 1400 ml  Net -940 ml   Filed Weights   10/21/17 0326 10/22/17 0327 10/23/17 0209  Weight: 144 lb 6.4 oz (65.5 kg) 138 lb 14.4 oz (63 kg) 145 lb 11.6 oz (66.1 kg)    Telemetry    Rate controlled AFib  80s   - Personally Reviewed  ECG    No new tracings - Personally Reviewed  Physical Exam   GEN: No acute distress.   Neck: JVP normal   Cardiac: irregular rhythm, rate ,  Respiratory: respirations unlabored, diminished in bases GI: Soft, nontender, non-distended  MS: No edema, chronic skin changes B LE; No deformity. Neuro:  Nonfocal  Psych: Normal affect   Labs    Chemistry Recent Labs  Lab 10/20/17 0234 10/21/17 0327 10/22/17 0313 10/23/17 0256  NA 138 136 138 141  K 4.1 4.1 3.9 4.5  CL 102 100* 99* 100*    CO2 27 29 29  32  GLUCOSE 106* 110* 94 119*  BUN 26* 25* 25* 25*  CREATININE 0.93 0.88 0.87 0.89  CALCIUM 8.4* 8.5* 8.7* 8.7*  PROT 6.4*  --   --   --   ALBUMIN 2.8*  --   --   --   AST 23  --   --   --   ALT 18  --   --   --   ALKPHOS 84  --   --   --   BILITOT 1.4*  --   --   --   GFRNONAA >60 >60 >60 >60  GFRAA >60 >60 >60 >60  ANIONGAP 9 7 10 9      Hematology Recent Labs  Lab 10/21/17 0327 10/22/17 0313 10/23/17 0256  WBC 15.4* 13.9* 11.4*  RBC 4.51 4.64 4.67  HGB 10.8* 11.1* 11.0*  HCT 34.9* 36.6* 36.7*  MCV 77.4* 78.9 78.6  MCH 23.9* 23.9* 23.6*  MCHC 30.9 30.3 30.0  RDW 17.7* 17.7* 17.3*  PLT 189 207 222    Cardiac Enzymes Recent Labs  Lab 10/19/17 0935 10/19/17 1434 10/19/17 2040  TROPONINI  1.01* 1.01* 0.69*    Recent Labs  Lab 10/19/17 0308  TROPIPOC 0.00     BNP Recent Labs  Lab 10/19/17 0310  BNP 270.4*     DDimer No results for input(s): DDIMER in the last 168 hours.   Radiology    No results found.  Cardiac Studies   Echo 07/18/17: Study Conclusions - Left ventricle: The cavity size was normal. Wall thickness was   increased in a pattern of mild LVH. Systolic function was   vigorous. The estimated ejection fraction was in the range of 65%   to 70%. Wall motion was normal; there were no regional wall   motion abnormalities. - Aortic valve: Moderately calcified annulus. Severely thickened,   severely calcified leaflets. There was mild stenosis. - Mitral valve: Moderately dilated annulus. Severely calcified   leaflets . The findings are consistent with severe stenosis.   There was moderate regurgitation. Valve area by pressure   half-time: 1.68 cm^2. Valve area by continuity equation (using   LVOT flow): 0.7 cm^2. - Left atrium: The atrium was severely dilated. - Right ventricle: The cavity size was mildly dilated. Systolic   function was mildly reduced. - Right atrium: The atrium was mildly to moderately dilated. - Tricuspid  valve: There was moderate-severe regurgitation. - Pulmonary arteries: Systolic pressure was moderately to severely   increased. PA peak pressure: 60 mm Hg (S).  Patient Profile     84 y.o. male with chronic atrial fibrillation, chronic diastolic CHF, severe mitral stenosis, HTN, pulmHTN, COPD on 2L [place], who presented with DOE after power outage limited use of home O2 in the last 48 hours.   Assessment & Plan    1. Acute on chronic respiratory failure 2/2 acute on chronic diastolic heart failure - Diuresing  Now about 2.9 L negative   - diuresed on 40 mg IV lasix BID - weight is 145   This is up from 138 yestday (may have been error) Down from 158 lbs on admission Would switch to oral lasix   Will need close outpt f/u    2. Elevated troponin - No chest pain - Troponin trend mild and flat, likely secondary to CHF exacerbation and afib with  RVR - no ischemic evaluation planned   3. Atrial fibrillation - rate controlled - continue lopressor and coumadin   4. Severe mitral stenosis  WIll not tolerate high rates     5. HTN - pressures well-controlled  - continue lopressor   6. HLD - continue lipitor  7  Dispo   Pt lives at home (house) with wife who is similar age  Son who is rel disabled in [place]  Not a help  Daughter lives in [place] My concern is pt hs been in bed for most of hosp   He has no help at home [place] need [facility] as being arranged  May need more help   Will make sure pt has f/u in clinic   From cardiac standpoint could go home    For questions or updates, please contact [facility] Please consult [email or web address] for contact info under Cardiology/STEMI.      Signed, Dorris Carnes, MD  10/23/2017, 9:26 AM    Pt seen and examined   I agree with findings of A [facility] above   I have amended this note above   Pt improving    On exam:  Lungs are CTA   Cardiac Irreg Irreg   No S3   Ext with triv edema  Continue diuresis for diastolic CHF Rate control and anticoagulate  for atrial fibrillation   Pt's mitral stenosis makes him acutely sensiitive to rapid rates.  Dorris Carnes

## 2017-10-23 NOTE — Progress Notes (Signed)
[place] for warfarin Indication: atrial fibrillation  Allergies  Allergen Reactions  . Penicillins Rash    Has patient had a PCN reaction causing immediate rash, facial/tongue/throat swelling, SOB or lightheadedness with hypotension: Yes Has patient had a PCN reaction causing severe rash involving mucus membranes or skin necrosis: Unk Has patient had a PCN reaction that required hospitalization: No Has patient had a PCN reaction occurring within the last 10 years: No If all of the above answers are "NO", then may proceed with Cephalosporin use.    Labs: Recent Labs    10/21/17 0327 10/22/17 0313 10/23/17 0256  HGB 10.8* 11.1* 11.0*  HCT 34.9* 36.6* 36.7*  PLT 189 207 222  LABPROT 24.2* 23.9* 24.4*  INR 2.20 2.15 2.21  CREATININE 0.88 0.87 0.89   Estimated Creatinine Clearance: 51.6 mL/min (by C-G formula based on SCr of 0.89 mg/dL).  Aaron Frye: 84 YO male with Afib on warfarin PTA which he states is 5mg  once daily except Sundays, on Sundays he takes 7.5mg , and his last dose was 4/19 AM.  INR therapeutic on admission at 2.45 and remains in goal at 2.21 on current regimen.  Goal of Therapy:  INR 2-3 Monitor platelets by anticoagulation protocol: Yes   Plan:  Give warfarin 5mg  per home regimen Daily INR, CBC, s/s bleeding  Thank you Anette Guarneri, PharmD [phone] 10/23/2017 10:44 AM

## 2017-10-23 NOTE — Progress Notes (Signed)
SATURATION QUALIFICATIONS: (This note is used to comply with regulatory documentation for home oxygen)  Patient Saturations on Room Air at Rest = 86%  Patient Saturations on Room Air while Ambulating = 85%  Patient Saturations on 2 Liters of oxygen while Ambulating = 96%  Please briefly explain why patient needs home oxygen:

## 2017-10-24 DIAGNOSIS — I5021 Acute systolic (congestive) heart failure: Secondary | ICD-10-CM

## 2017-10-24 LAB — COMPREHENSIVE METABOLIC PANEL WITH GFR
ALT: 19 U/L (ref 17–63)
AST: 28 U/L (ref 15–41)
Albumin: 2.7 g/dL — ABNORMAL LOW (ref 3.5–5.0)
Alkaline Phosphatase: 93 U/L (ref 38–126)
Anion gap: 9 (ref 5–15)
BUN: 23 mg/dL — ABNORMAL HIGH (ref 6–20)
CO2: 31 mmol/L (ref 22–32)
Calcium: 8.5 mg/dL — ABNORMAL LOW (ref 8.9–10.3)
Chloride: 99 mmol/L — ABNORMAL LOW (ref 101–111)
Creatinine, Ser: 0.9 mg/dL (ref 0.61–1.24)
GFR calc Af Amer: >60 mL/min
GFR calc non Af Amer: >60 mL/min
Glucose, Bld: 100 mg/dL — ABNORMAL HIGH (ref 65–99)
Potassium: 4 mmol/L (ref 3.5–5.1)
Sodium: 139 mmol/L (ref 135–145)
Total Bilirubin: 0.9 mg/dL (ref 0.3–1.2)
Total Protein: 6.2 g/dL — ABNORMAL LOW (ref 6.5–8.1)

## 2017-10-24 LAB — GLUCOSE, CAPILLARY
Glucose-Capillary: 91 mg/dL (ref 65–99)
Glucose-Capillary: 182 mg/dL — ABNORMAL HIGH (ref 65–99)

## 2017-10-24 LAB — PROTIME-INR
INR: 2.16
PROTHROMBIN TIME: 23.9 s — AB (ref 11.4–15.2)

## 2017-10-24 MED ORDER — RAMIPRIL 5 MG PO CAPS: 5.0000 mg | ORAL_CAPSULE | Freq: Every day | ORAL | 1 refills | Status: DC

## 2017-10-24 MED ORDER — FUROSEMIDE 40 MG PO TABS: 40.0000 mg | ORAL_TABLET | Freq: Two times a day (BID) | ORAL | 1 refills | Status: DC

## 2017-10-24 MED ORDER — METOPROLOL SUCCINATE ER 100 MG PO TB24: 100.0000 mg | ORAL_TABLET | Freq: Every day | ORAL | Status: DC

## 2017-10-24 MED ORDER — CEFDINIR 300 MG PO CAPS: 300.0000 mg | ORAL_CAPSULE | Freq: Two times a day (BID) | ORAL | 0 refills | Status: AC

## 2017-10-24 MED ORDER — METOPROLOL SUCCINATE ER 25 MG PO TB24: 75.0000 mg | ORAL_TABLET | Freq: Every day | ORAL | 2 refills | Status: DC

## 2017-10-24 MED ORDER — METOPROLOL SUCCINATE ER 50 MG PO TB24: 75.0000 mg | ORAL_TABLET | Freq: Every day | ORAL | Status: DC

## 2017-10-24 NOTE — Progress Notes (Signed)
Physical Therapy Treatment Patient Details Name: Aaron Frye MRN: [id number] DOB: 12/30/1924 Today's Date: 10/24/2017    History of Present Illness Pt admitted with acute on chronic respiratory failure due to acute on chronic heart failure and Atrial Fibrillation with RVR. PMH - rt femur fx, O2 dependent copd, diastolic heart failure    PT Comments    Patient is progressing very well towards their physical therapy goals. Increase in ambulation distance to 200 feet with RW and supervision. Patient stating, "I can feel that I just went for a walk." Rest of session focused on lower extremity therapeutic exercises with increased repetitions to promote endurance. Patient wants to feel more independent, therefore, will continue to follow to progress activity tolerance and functional strengthening.    Follow Up Recommendations  Home health PT     Equipment Recommendations  Rolling walker with 5" wheels    Recommendations for Other Services       Precautions / Restrictions Precautions Precautions: Fall Restrictions Weight Bearing Restrictions: No    Mobility  Bed Mobility Overal bed mobility: Modified Independent             General bed mobility comments: Increased time for supine to sit  Transfers Overall transfer level: Needs assistance Equipment used: Rolling walker (2 wheeled) Transfers: Sit to/from Stand Sit to Stand: Supervision         General transfer comment: Supervision for safety to power up to standing.   Ambulation/Gait Ambulation/Gait assistance: Supervision Ambulation Distance (Feet): 200 Feet Assistive device: Rolling walker (2 wheeled) Gait Pattern/deviations: Step-through pattern;Decreased stride length;Trunk flexed Gait velocity: decr   General Gait Details: Supervision for safety. VC's for looking up and RW proximity. 95-96% SpO2 on 2L [place] with ambulation.    Stairs             Wheelchair Mobility    Modified Rankin (Stroke  Patients Only)       Balance Overall balance assessment: Needs assistance Sitting-balance support: No upper extremity supported;Feet supported Sitting balance-Leahy Scale: Good     Standing balance support: No upper extremity supported;During functional activity Standing balance-Leahy Scale: Fair Standing balance comment: Statically able to stand without UE support. Requires UE support for dynamic tasks.                             Cognition Arousal/Alertness: Awake/alert Behavior During Therapy: WFL for tasks assessed/performed Overall Cognitive Status: Within Functional Limits for tasks assessed                                        Exercises General Exercises - Lower Extremity Long Arc Quad: 15 reps;Both;Seated Hip Flexion/Marching: 20 reps;Both;Seated    General Comments        Pertinent Vitals/Pain Pain Assessment: No/denies pain    Home Living                      Prior Function            PT Goals (current goals can now be found in the care plan section) Acute Rehab PT Goals Patient Stated Goal: return home PT Goal Formulation: With patient/family Time For Goal Achievement: 10/27/17 Potential to Achieve Goals: Good Progress towards PT goals: Progressing toward goals    Frequency    Min 3X/week      PT Plan Current plan  remains appropriate    Co-evaluation              AM-PAC PT "6 Clicks" Daily Activity  Outcome Measure  Difficulty turning over in bed (including adjusting bedclothes, sheets and blankets)?: A Little Difficulty moving from lying on back to sitting on the side of the bed? : A Little Difficulty sitting down on and standing up from a chair with arms (e.g., wheelchair, bedside commode, etc,.)?: A Little Help needed moving to and from a bed to chair (including a wheelchair)?: A Little Help needed walking in hospital room?: A Little Help needed climbing 3-5 steps with a railing? : A  Little 6 Click Score: 18    End of Session Equipment Utilized During Treatment: Oxygen;Gait belt Activity Tolerance: Patient tolerated treatment well Patient left: in chair;with call bell/phone within reach;with family/visitor present Nurse Communication: Mobility status PT Visit Diagnosis: Unsteadiness on feet (R26.81);Muscle weakness (generalized) (M62.81)     Time: 1916-6060 PT Time Calculation (min) (ACUTE ONLY): 26 min  Charges:  $Gait Training: 8-22 mins $Therapeutic Exercise: 8-22 mins                    G Codes:      Ellamae Sia, PT, DPT Acute Rehabilitation Services  Pager: [phone]   Willy Eddy 10/24/2017, 11:42 AM

## 2017-10-24 NOTE — Plan of Care (Signed)
  Problem: Education: Goal: Knowledge of disease or condition will improve Outcome: Progressing   Problem: Activity: Goal: Ability to tolerate increased activity will improve Outcome: Progressing   Problem: Activity: Goal: Risk for activity intolerance will decrease Outcome: Progressing   

## 2017-10-24 NOTE — Progress Notes (Signed)
Occupational Therapy Treatment (late entry from the AM) Patient Details Name: Aaron Frye MRN: [id number] DOB: 1925-01-31 Today's Date: 10/24/2017    History of present illness Pt admitted with acute on chronic respiratory failure due to acute on chronic heart failure and Atrial Fibrillation with RVR. PMH - rt femur fx, O2 dependent copd, diastolic heart failure   OT comments  Pt progressing towards goals this session. IN addition to toilet transfer and sink level grooming, energy conservation education provided verbally (no handout) and Pt very receptive. Pt will continue to benefit from skilled OT in the post-acute setting in [facility] for safety/falls eval in the home as well as practice tub transfer in natural environment. Thank you for the opportunity to serve this patient.    Follow Up Recommendations  Home health OT;Supervision/Assistance - 24 hour    Equipment Recommendations  3 in 1 bedside commode    Recommendations for Other Services      Precautions / Restrictions Precautions Precautions: Fall Restrictions Weight Bearing Restrictions: No       Mobility Bed Mobility Overal bed mobility: Modified Independent             General bed mobility comments: Pt OOB in recliner when OT entered  Transfers Overall transfer level: Needs assistance Equipment used: Rolling walker (2 wheeled) Transfers: Sit to/from Stand Sit to Stand: Supervision         General transfer comment: Supervision for safety to power up to standing.     Balance Overall balance assessment: Needs assistance Sitting-balance support: No upper extremity supported;Feet supported Sitting balance-Leahy Scale: Good     Standing balance support: No upper extremity supported;During functional activity Standing balance-Leahy Scale: Fair Standing balance comment: Statically able to stand without UE support. Requires UE support for dynamic tasks.                            ADL either  performed or assessed with clinical judgement   ADL Overall ADL's : Needs assistance/impaired     Grooming: Supervision/safety;Standing;Wash/dry face;Wash/dry Nurse, mental health Details (indicate cue type and reason): sink level                 Toilet Transfer: Min guard;Ambulation;RW Toilet Transfer Details (indicate cue type and reason): use of grab bars for transfer Toileting- Clothing Manipulation and Hygiene: Supervision/safety;Sit to/from stand Toileting - Clothing Manipulation Details (indicate cue type and reason): rear peri care       General ADL Comments: Educated on energy conservation for DOE with strategies like sitting for performing ADL at sink. Pt and wife open to education               Cognition Arousal/Alertness: Awake/alert Behavior During Therapy: WFL for tasks assessed/performed Overall Cognitive Status: Within Functional Limits for tasks assessed           Exercises Exercises: General Lower Extremity General Exercises - Lower Extremity Long Arc Quad: 15 reps;Both;Seated Hip Flexion/Marching: 20 reps;Both;Seated    Pertinent Vitals/ Pain       Pain Assessment: No/denies pain         Frequency  Min 2X/week        Progress Toward Goals  OT Goals(current goals can now be found in the care plan section)  Progress towards OT goals: Progressing toward goals  Acute Rehab OT Goals Patient Stated Goal: return home OT Goal Formulation: With patient/family Time For Goal Achievement: 11/04/17 Potential to Achieve Goals: Good  Plan  Discharge plan remains appropriate    Co-evaluation                 AM-PAC PT "6 Clicks" Daily Activity     Outcome Measure   Help from another person eating meals?: None Help from another person taking care of personal grooming?: A Little Help from another person toileting, which includes using toliet, bedpan, or urinal?: A Little Help from another person bathing (including washing, rinsing, drying)?:  A Little Help from another person to put on and taking off regular upper body clothing?: A Little Help from another person to put on and taking off regular lower body clothing?: A Little 6 Click Score: 19    End of Session Equipment Utilized During Treatment: Gait belt;Rolling walker;Oxygen  OT Visit Diagnosis: Other abnormalities of gait and mobility (R26.89)   Activity Tolerance Patient tolerated treatment well   Patient Left in chair;with call bell/phone within reach;with family/visitor present   Nurse Communication Mobility status        Time: 0370-9643 OT Time Calculation (min): 14 min  Charges: OT General Charges $OT Visit: 1 Visit OT Treatments $Self Care/Home Management : 8-22 mins  Hulda Humphrey OTR/L [place] 10/24/2017, 2:36 PM

## 2017-10-24 NOTE — Progress Notes (Signed)
Progress Note  Patient Name: Aaron Frye Date of Encounter: 10/24/2017  Primary Cardiologist: Jenkins Rouge, MD   Subjective   No SOB on 2L   No CP      Inpatient Medications    Scheduled Meds: . atorvastatin  40 mg Oral q1800  . cefdinir  300 mg Oral Q12H  . furosemide  40 mg Oral BID  . insulin aspart  0-9 Units Subcutaneous TID WC  . loratadine  10 mg Oral Daily  . metoprolol tartrate  25 mg Oral TID  . multivitamin  1 tablet Oral Daily  . potassium chloride  10 mEq Oral BID  . Warfarin - Pharmacist Dosing Inpatient   Does not apply q1800   Continuous Infusions:  PRN Meds: acetaminophen **OR** acetaminophen, bisacodyl, ipratropium, levalbuterol, phenol, pneumococcal 23 valent vaccine, polyethylene glycol   Vital Signs    Vitals:   10/23/17 1510 10/23/17 2007 10/24/17 0304 10/24/17 0328  BP: (!) 96/58 115/67    Pulse: 77     Resp: (!) 21     Temp: 98.1 F (36.7 C) 98.7 F (37.1 C)  98.9 F (37.2 C)  TempSrc: Oral Oral  Oral  SpO2: 99%     Weight:   145 lb 11.6 oz (66.1 kg)   Height:        Intake/Output Summary (Last 24 hours) at 10/24/2017 0715 Last data filed at 10/24/2017 0329 Gross per 24 hour  Intake 720 ml  Output 651 ml  Net 69 ml   Neg  Filed Weights   10/22/17 0327 10/23/17 0209 10/24/17 0304  Weight: 138 lb 14.4 oz (63 kg) 145 lb 11.6 oz (66.1 kg) 145 lb 11.6 oz (66.1 kg)    Telemetry     afib  Rates good - Personally Reviewed  ECG    No new tracings - Personally Reviewed  Physical Exam   GEN: No acute distress.   Neck: No JVD   Cardiac: irregular rhythm, rate ,  Respiratory: respirations unlabored, diminished in bases GI: Soft, nontender, non-distended  MS: No edema, chronic skin changes B LE; No deformity. Neuro:  Nonfocal  Psych: Normal affect   Labs    Chemistry Recent Labs  Lab 10/20/17 0234  10/22/17 0313 10/23/17 0256 10/24/17 0221  NA 138   < > 138 141 139  K 4.1   < > 3.9 4.5 4.0  CL 102   < > 99* 100*  99*  CO2 27   < > 29 32 31  GLUCOSE 106*   < > 94 119* 100*  BUN 26*   < > 25* 25* 23*  CREATININE 0.93   < > 0.87 0.89 0.90  CALCIUM 8.4*   < > 8.7* 8.7* 8.5*  PROT 6.4*  --   --   --  6.2*  ALBUMIN 2.8*  --   --   --  2.7*  AST 23  --   --   --  28  ALT 18  --   --   --  19  ALKPHOS 84  --   --   --  93  BILITOT 1.4*  --   --   --  0.9  GFRNONAA >60   < > >60 >60 >60  GFRAA >60   < > >60 >60 >60  ANIONGAP 9   < > 10 9 9    < > = values in this interval not displayed.     Hematology Recent Labs  Lab 10/21/17 0327  10/22/17 0313 10/23/17 0256  WBC 15.4* 13.9* 11.4*  RBC 4.51 4.64 4.67  HGB 10.8* 11.1* 11.0*  HCT 34.9* 36.6* 36.7*  MCV 77.4* 78.9 78.6  MCH 23.9* 23.9* 23.6*  MCHC 30.9 30.3 30.0  RDW 17.7* 17.7* 17.3*  PLT 189 207 222    Cardiac Enzymes Recent Labs  Lab 10/19/17 0935 10/19/17 1434 10/19/17 2040  TROPONINI 1.01* 1.01* 0.69*    Recent Labs  Lab 10/19/17 0308  TROPIPOC 0.00     BNP Recent Labs  Lab 10/19/17 0310  BNP 270.4*     DDimer No results for input(s): DDIMER in the last 168 hours.   Radiology    No results found.  Cardiac Studies   Echo 07/18/17: Study Conclusions - Left ventricle: The cavity size was normal. Wall thickness was   increased in a pattern of mild LVH. Systolic function was   vigorous. The estimated ejection fraction was in the range of 65%   to 70%. Wall motion was normal; there were no regional wall   motion abnormalities. - Aortic valve: Moderately calcified annulus. Severely thickened,   severely calcified leaflets. There was mild stenosis. - Mitral valve: Moderately dilated annulus. Severely calcified   leaflets . The findings are consistent with severe stenosis.   There was moderate regurgitation. Valve area by pressure   half-time: 1.68 cm^2. Valve area by continuity equation (using   LVOT flow): 0.7 cm^2. - Left atrium: The atrium was severely dilated. - Right ventricle: The cavity size was mildly  dilated. Systolic   function was mildly reduced. - Right atrium: The atrium was mildly to moderately dilated. - Tricuspid valve: There was moderate-severe regurgitation. - Pulmonary arteries: Systolic pressure was moderately to severely   increased. PA peak pressure: 60 mm Hg (S).  Patient Profile     84 y.o. male with chronic atrial fibrillation, chronic diastolic CHF, severe mitral stenosis, HTN, pulmHTN, COPD on 2L [place], who presented with DOE after power outage limited use of home O2 in the last 48 hours.   Assessment & Plan    1. Acute on chronic respiratory failure 2/2 acute on chronic diastolic heart failure -  Volume status is pretty good   He will need to be followed as outpt    Keep on sam meds Instructed pt to watch fluid and salt intatke     2. Elevated troponin - Troponin trend mild and flat, likely secondary to CHF exacerbation and afib with  RVR - no ischemic evaluation planned   3. Atrial fibrillation - rate controlled - continue lopressor and coumadin   4. Severe mitral stenosis  WIll not tolerate high rates     5. HTN  BP OK  AL little low at times    6. HLD - continue lipitor  7  Dispo   Pt lives at home (house) with wife who is similar age  Son who is rel disabled in [place]  Not a help  Daughter lives in [place] My concern is pt hs been in bed for most of hosp   He has no help at home [place] need [facility] as being arranged  May need more help   Will make sure pt has f/u in clinic with Dr Johnsie Cancel    From cardiac standpoint could go home    For questions or updates, please contact [facility] Please consult [email or web address] for contact info under Cardiology/STEMI.      Signed, Dorris Carnes, MD  10/24/2017, 7:15 AM    Pt  seen and examined   I agree with findings of A [facility] above   I have amended this note above   Pt improving    On exam:  Lungs are CTA   Cardiac Irreg Irreg   No S3   Ext with triv edema  Continue diuresis for diastolic CHF Rate control and  anticoagulate for atrial fibrillation   Pt's mitral stenosis makes him acutely sensiitive to rapid rates.  Dorris Carnes

## 2017-10-24 NOTE — Progress Notes (Signed)
Pt and daughter given discharge instructions with understanding. No questions at this time. Iv and monitor d/c. Daughter brought home oxygen tank.

## 2017-10-24 NOTE — Discharge Summary (Signed)
Physician Discharge Summary  Aaron Frye MRN: [id number] DOB/AGE: [birth year] 84 y.o.  PCP: Stephens Shire, MD   Admit date: 10/19/2017 Discharge date: 10/24/2017  Discharge Diagnoses:    Principal Problem:   Acute on chronic respiratory failure with hypoxia [facility]) Active Problems:   Essential hypertension   Atrial fibrillation with RVR (HCC)   Congestive heart failure (HCC)   Long term current use of anticoagulant therapy   HLD (hyperlipidemia)   Leukocytosis   Diastolic CHF, acute on chronic (HCC)   Mitral stenosis   SOB (shortness of breath)   Hyperglycemia   COPD with hypoxia (HCC)   Elevated troponin   Acute lower UTI   Acute respiratory failure with hypoxia ([place])    Follow-up recommendations Patient to follow-up with cardiology Dr. Johnsie Cancel in 1-2 weeks ACE inhibitor held at the time of discharge , this may be resumed if deemed appropriate by cardiology on his follow-up visit CBC, CMP, magnesium in 3-5 days Patient is being discharged with home health       Allergies as of 10/24/2017      Reactions   Penicillins Rash   Has patient had a PCN reaction causing immediate rash, facial/tongue/throat swelling, SOB or lightheadedness with hypotension: Yes Has patient had a PCN reaction causing severe rash involving mucus membranes or skin necrosis: Unk Has patient had a PCN reaction that required hospitalization: No Has patient had a PCN reaction occurring within the last 10 years: No If all of the above answers are "NO", then may proceed with Cephalosporin use.      Medication List    TAKE these medications   acetaminophen 325 MG tablet Commonly known as:  TYLENOL Take 325 mg by mouth 2 (two) times daily as needed (for headaches).   ADULT ONE DAILY GUMMIES Chew Chew 1 tablet by mouth daily.   albuterol 108 (90 Base) MCG/ACT inhaler Commonly known as:  PROVENTIL HFA;VENTOLIN HFA Inhale 2 puffs into the lungs every 6 (six) hours as needed for wheezing or  shortness of breath.   atorvastatin 40 MG tablet Commonly known as:  LIPITOR TAKE ONE TABLET BY MOUTH ONCE DAILY   cefdinir 300 MG capsule Commonly known as:  OMNICEF Take 1 capsule (300 mg total) by mouth every 12 (twelve) hours for 5 days.   furosemide 40 MG tablet Commonly known as:  LASIX Take 1 tablet (40 mg total) by mouth 2 (two) times daily. What changed:    medication strength  how much to take  when to take this   ipratropium-albuterol 0.5-2.5 (3) MG/3ML Soln Commonly known as:  DUONEB Inhale 3 mLs into the lungs every 6 (six) hours as needed (for shortness of breath or wheezing).   KLOR-CON M10 10 MEQ tablet Generic drug:  potassium chloride TAKE 1 TABLET BY MOUTH TWICE DAILY   loratadine 10 MG tablet Commonly known as:  CLARITIN Take 1 tablet (10 mg total) by mouth daily.   metoprolol succinate 25 MG 24 hr tablet Commonly known as:  TOPROL-XL Take 3 tablets (75 mg total) by mouth daily. What changed:    medication strength  See the new instructions.   moxifloxacin 0.5 % ophthalmic solution Commonly known as:  VIGAMOX Place 1 drop into the right eye See admin instructions. Instill 1 drop into the right eye daily AS DIRECTED for 2 days after ocular injection every three months   OXYGEN Inhale 2 L into the lungs continuous.   ramipril 5 MG capsule Commonly known as:  ALTACE  Take 1 capsule (5 mg total) by mouth daily. Start taking on:  11/07/2017 What changed:  These instructions start on 11/07/2017. If you are unsure what to do until then, ask your doctor or other care provider.   saccharomyces boulardii 250 MG capsule Commonly known as:  FLORASTOR Take 1 capsule (250 mg total) by mouth 2 (two) times daily.   warfarin 5 MG tablet Commonly known as:  COUMADIN Take as directed. If you are unsure how to take this medication, talk to your nurse or doctor. Original instructions:  TAKE AS DIRECTED BY MOUTH BY COUMADIN CLINIC What changed:  See the new  instructions.            Durable Medical Equipment  (From admission, onward)        Start     Ordered   10/24/17 0827  For home use only DME oxygen  Once    Question Answer Comment  Mode or (Route) Nasal cannula   Liters per Minute 2   Frequency Continuous (stationary and portable oxygen unit needed)   Oxygen conserving device Yes   Oxygen delivery system Gas      10/24/17 0826   10/23/17 0817  For home use only DME Walker rolling  Once    Question:  Patient needs a walker to treat with the following condition  Answer:  Heart failure ([place])   10/23/17 0816   10/22/17 0756  For home use only DME 3 n 1  Once     10/22/17 0755       Discharge Condition:  Stable   Discharge Instructions Get Medicines reviewed and adjusted: Please take all your medications with you for your next visit with your Primary MD  Please request your Primary MD to go over all hospital tests and procedure/radiological results at the follow up, please ask your Primary MD to get all Hospital records sent to his/her office.  If you experience worsening of your admission symptoms, develop shortness of breath, life threatening emergency, suicidal or homicidal thoughts you must seek medical attention immediately by calling 911 or calling your MD immediately if symptoms less severe.  You must read complete instructions/literature along with all the possible adverse reactions/side effects for all the Medicines you take and that have been prescribed to you. Take any new Medicines after you have completely understood and accpet all the possible adverse reactions/side effects.   Do not drive when taking Pain medications.   Do not take more than prescribed Pain, Sleep and Anxiety Medications  Special Instructions: If you have smoked or chewed Tobacco in the last 2 yrs please stop smoking, stop any regular Alcohol and or any Recreational drug use.  Wear Seat belts while driving.  Please note  You were  cared for by a hospitalist during your hospital stay. Once you are discharged, your primary care physician will handle any further medical issues. Please note that NO REFILLS for any discharge medications will be authorized once you are discharged, as it is imperative that you return to your primary care physician (or establish a relationship with a primary care physician if you do not have one) for your aftercare needs so that they can reassess your need for medications and monitor your lab values.     Allergies  Allergen Reactions  . Penicillins Rash    Has patient had a PCN reaction causing immediate rash, facial/tongue/throat swelling, SOB or lightheadedness with hypotension: Yes Has patient had a PCN reaction causing severe rash involving mucus  membranes or skin necrosis: Unk Has patient had a PCN reaction that required hospitalization: No Has patient had a PCN reaction occurring within the last 10 years: No If all of the above answers are "NO", then may proceed with Cephalosporin use.       Disposition: Discharge disposition: 01-Home or Self Care        Consults:  cardiology   Significant Diagnostic Studies:  X-ray Chest Pa And Lateral  Result Date: 10/20/2017 CLINICAL DATA:  Shortness of breath on exertion. EXAM: CHEST - 2 VIEW COMPARISON:  10/19/2017. FINDINGS: Stable enlarged cardiac silhouette diffusely prominent pulmonary vasculature and interstitial markings with significantly less bilateral airspace opacity. Increased right pleural fluid. Small left pleural effusion. No acute bony abnormality. IMPRESSION: 1. Significantly improved bilateral alveolar edema. 2. Bilateral pleural effusions, mildly increased. 3. Cardiomegaly, pulmonary vascular congestion and some residual interstitial pulmonary edema. Electronically Signed   By: Claudie Revering M.D.   On: 10/20/2017 09:40   Dg Chest Portable 1 View  Result Date: 10/19/2017 CLINICAL DATA:  Respiratory distress EXAM:  PORTABLE CHEST 1 VIEW COMPARISON:  07/23/2017 FINDINGS: Cardiomegaly with aortic atherosclerosis. No aneurysm. Moderate right effusion with pulmonary edema. Patchy airspace opacities noted bilaterally likely related to CHF though superimposed pneumonia would be difficult to entirely exclude. Degenerative changes are seen along the dorsal spine and about both shoulders. IMPRESSION: Cardiomegaly with aortic atherosclerosis. Pulmonary edema with moderate right effusion compatible with changes of CHF. Superimposed bilateral multilobar pneumonia would be difficult to entirely exclude though favor CHF. Electronically Signed   By: Ashley Royalty M.D.   On: 10/19/2017 03:16        Filed Weights   10/22/17 0327 10/23/17 0209 10/24/17 0304  Weight: 63 kg (138 lb 14.4 oz) 66.1 kg (145 lb 11.6 oz) 66.1 kg (145 lb 11.6 oz)     Microbiology: Recent Results (from the past 240 hour(s))  Urine Culture     Status: Abnormal   Collection Time: 10/21/17 10:22 AM  Result Value Ref Range Status   Specimen Description URINE, CLEAN CATCH  Final   Special Requests   Final    NONE Performed at [facility], [place] [address], [place], [place] [address]    Culture >=100,000 COLONIES/mL ESCHERICHIA COLI (A)  Final   Report Status 10/23/2017 FINAL  Final   Organism ID, Bacteria ESCHERICHIA COLI (A)  Final      Susceptibility   Escherichia coli - MIC*    AMPICILLIN 8 SENSITIVE Sensitive     CEFAZOLIN <=4 SENSITIVE Sensitive     CEFTRIAXONE <=1 SENSITIVE Sensitive     CIPROFLOXACIN <=0.25 SENSITIVE Sensitive     GENTAMICIN <=1 SENSITIVE Sensitive     IMIPENEM <=0.25 SENSITIVE Sensitive     NITROFURANTOIN <=16 SENSITIVE Sensitive     TRIMETH/SULFA <=20 SENSITIVE Sensitive     AMPICILLIN/SULBACTAM <=2 SENSITIVE Sensitive     PIP/TAZO <=4 SENSITIVE Sensitive     Extended ESBL NEGATIVE Sensitive     * >=100,000 COLONIES/mL ESCHERICHIA COLI       Blood Culture    Component Value Date/Time   SDES URINE,  CLEAN CATCH 10/21/2017 1022   SPECREQUEST  10/21/2017 1022    NONE Performed at [facility] [address], [place], [place] [address]    CULT >=100,000 COLONIES/mL ESCHERICHIA COLI (A) 10/21/2017 1022   REPTSTATUS 10/23/2017 FINAL 10/21/2017 1022      Labs: Results for orders placed or performed during the hospital encounter of 10/19/17 (from the  past 48 hour(s))  Glucose, capillary     Status: Abnormal   Collection Time: 10/22/17 11:08 AM  Result Value Ref Range   Glucose-Capillary 165 (H) 65 - 99 mg/dL  Glucose, capillary     Status: Abnormal   Collection Time: 10/22/17  4:47 PM  Result Value Ref Range   Glucose-Capillary 128 (H) 65 - 99 mg/dL  Glucose, capillary     Status: Abnormal   Collection Time: 10/22/17  9:26 PM  Result Value Ref Range   Glucose-Capillary 154 (H) 65 - 99 mg/dL  Protime-INR     Status: Abnormal   Collection Time: 10/23/17  2:56 AM  Result Value Ref Range   Prothrombin Time 24.4 (H) 11.4 - 15.2 seconds   INR 2.21     Comment: Performed at [facility], [place] [address], [place], [place] [address]  CBC     Status: Abnormal   Collection Time: 10/23/17  2:56 AM  Result Value Ref Range   WBC 11.4 (H) 4.0 - 10.5 K/uL   RBC 4.67 4.22 - 5.81 MIL/uL   Hemoglobin 11.0 (L) 13.0 - 17.0 g/dL   HCT 36.7 (L) 39.0 - 52.0 %   MCV 78.6 78.0 - 100.0 fL   MCH 23.6 (L) 26.0 - 34.0 pg   MCHC 30.0 30.0 - 36.0 g/dL   RDW 17.3 (H) 11.5 - 15.5 %   Platelets 222 150 - 400 K/uL    Comment: Performed at [facility], [place] [address], [place], [place] [address]  Basic metabolic panel     Status: Abnormal   Collection Time: 10/23/17  2:56 AM  Result Value Ref Range   Sodium 141 135 - 145 mmol/L   Potassium 4.5 3.5 - 5.1 mmol/L   Chloride 100 (L) 101 - 111 mmol/L   CO2 32 22 - 32 mmol/L   Glucose, Bld 119 (H) 65 - 99 mg/dL   BUN 25 (H) 6 - 20 mg/dL   Creatinine, Ser 0.89 0.61 - 1.24 mg/dL   Calcium 8.7 (L) 8.9 - 10.3 mg/dL   GFR calc non Af Amer >60  >60 mL/min   GFR calc Af Amer >60 >60 mL/min    Comment: (NOTE) The eGFR has been calculated using the CKD EPI equation. This calculation has not been validated in all clinical situations. eGFR's persistently <60 mL/min signify possible Chronic Kidney Disease.    Anion gap 9 5 - 15    Comment: Performed at [facility] [address] [place], [place] [address]  Glucose, capillary     Status: Abnormal   Collection Time: 10/23/17  6:06 AM  Result Value Ref Range   Glucose-Capillary 116 (H) 65 - 99 mg/dL  Glucose, capillary     Status: Abnormal   Collection Time: 10/23/17 11:25 AM  Result Value Ref Range   Glucose-Capillary 144 (H) 65 - 99 mg/dL   Comment 1 Notify RN   Glucose, capillary     Status: None   Collection Time: 10/23/17  4:15 PM  Result Value Ref Range   Glucose-Capillary 90 65 - 99 mg/dL   Comment 1 Notify RN   Glucose, capillary     Status: Abnormal   Collection Time: 10/23/17  9:21 PM  Result Value Ref Range   Glucose-Capillary 106 (H) 65 - 99 mg/dL  Protime-INR     Status: Abnormal   Collection Time: 10/24/17  2:21 AM  Result Value Ref Range   Prothrombin Time 23.9 (H) 11.4 -  15.2 seconds   INR 2.16     Comment: Performed at [facility], [place] [address] [place], [place] [address]  Comprehensive metabolic panel     Status: Abnormal   Collection Time: 10/24/17  2:21 AM  Result Value Ref Range   Sodium 139 135 - 145 mmol/L   Potassium 4.0 3.5 - 5.1 mmol/L   Chloride 99 (L) 101 - 111 mmol/L   CO2 31 22 - 32 mmol/L   Glucose, Bld 100 (H) 65 - 99 mg/dL   BUN 23 (H) 6 - 20 mg/dL   Creatinine, Ser 0.90 0.61 - 1.24 mg/dL   Calcium 8.5 (L) 8.9 - 10.3 mg/dL   Total Protein 6.2 (L) 6.5 - 8.1 g/dL   Albumin 2.7 (L) 3.5 - 5.0 g/dL   AST 28 15 - 41 U/L   ALT 19 17 - 63 U/L   Alkaline Phosphatase 93 38 - 126 U/L   Total Bilirubin 0.9 0.3 - 1.2 mg/dL   GFR calc non Af Amer >60 >60 mL/min   GFR calc Af Amer >60 >60 mL/min    Comment: (NOTE) The eGFR has  been calculated using the CKD EPI equation. This calculation has not been validated in all clinical situations. eGFR's persistently <60 mL/min signify possible Chronic Kidney Disease.    Anion gap 9 5 - 15    Comment: Performed at [facility] [address], [place], [place] [address]  Glucose, capillary     Status: None   Collection Time: 10/24/17  6:18 AM  Result Value Ref Range   Glucose-Capillary 91 65 - 99 mg/dL     Lipid Panel     Component Value Date/Time   CHOL 97 09/24/2014 0507   TRIG 53 09/24/2014 0507   HDL 28 (L) 09/24/2014 0507   CHOLHDL 3.5 09/24/2014 0507   VLDL 11 09/24/2014 0507   LDLCALC 58 09/24/2014 0507     Lab Results  Component Value Date   HGBA1C 6.2 (H) 10/19/2017     Lab Results  Component Value Date   LDLCALC 58 09/24/2014   CREATININE 0.90 10/24/2017     HPI    84 y.o. history of HTN chronic afib with previous CVA on chronic coumadin. Last echo 07/18/17 with EF 65-70% mild AS severe functional MS from MAC mean gradient 12 mmHg and moderate MR. History of pneumonia earlier in year. Has had oxygen at home. Increasing dyspnea last 48 hours with care complicated by power outage at Home and unable to use his oxygen. Admitted with 48 hours of increasing dyspnea. No chest pain or palpitations no fever or cough On admission CXR consistent with CHF BNP only 270 but good response to diuretic. Troponin peak 1.01 down this am to .69 No acute ECG changes Afib rate a bit high and INR Rx. This am he feels better but Still with dyspnea when he exerts himself. Compliant with meds. Has chronic LE edema and history of venous stasis and ulcers.  Patient admitted and evaluated by cardiology during this admission  HOSPITAL COURSE:     #1 acute on chronic respiratory failure with hypoxia secondary to acute on chronic diastolic heart failure and A. 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  On admission was  placed on the BiPAP , given Lasix 80 mg IV x1 and  placed on the Cardizem drip with some improvement in respiratory status and patient subsequently transitioned off the BiPAP.  Patient currently off the BiPAP and on high flow nasal cannula at 2L.  Cardiac enzymes which was cycled were elevated at 1.01, 1.01, 0.69.  BNP was 270.4.   Patient then diuresed with Lasix 40 mg IV every 12 hours, 158 lbs >145 lbs .[name] Metoprolol changed to 25 mg 3 times a day. Subsequently changed to Lasix 40 mg twice a day .  renal function stable, okay to discharge from a cardiology standpoint    2.  Elevated troponins Questionable etiology.  May be secondary to acute coronary syndrome versus secondary to acute CHF exacerbation.  Patient denies any acute chest pain.  EKG with A. fib with no ischemic changes noted.     Patient with recent 2D echo 07/18/2017 with a EF of 65-70%, no wall motion abnormalities, mild aortic valvular stenosis, severe mitral valvular stenosis, moderate to severe tricuspid valvular regurgitation, PA peak pressure of 60 mmHg.  continue beta blocker, statin,  .  No indication for invasive workup at this time   Per cardiology.   3.  Mitral valvular stenosis/a mild aortic valvular stenosis/moderate to severe tricuspid valvular regurgitation/pulmonary hypertension Chronic in nature.  Continue current cardiac medications of diuretics, statin, beta-blocker.  Cardiology was consulted and has seen the patient.   patient would need to follow-up with cardiology as outpatient  4.  COPD Currently stable. Will need 2 L of oxygen at discharge based on ambulatory pulse oximetry assessment.  Patient with no further wheezing after diuresis. Continue Atrovent and Xopenex nebs, Claritin.  Follow.  5.  Hypertension Controlled on current regimen of diuretics and Lopressor. Held Altace due to borderline low blood pressure may be resumed when patient is seen by cardiology in the outpatient setting  6.  A. fib with  RVR Patient on admission was noted to be in A. fib with RVR and placed on the Cardizem drip for an hour and subsequently drip discontinued.  Metoprolol dose increased to 3 times daily per cardiology. Changed to Toprol-XL prior to discharge.  Heart rate improved.  .  Continue current medications.  Coumadin for anticoagulation.    7.  Hyperlipidemia Continue statin.  8.  Leukocytosis/UTI Likely secondary to UTI and a reactive leukocytosis.  Chest x-ray  No  acute infiltrate.  Patient afebrile.  Patient was complaining of lower abdominal pain with urination.  Leukocytosis trending down, 11.4 , down from 20.4.   Urinalysis consistent with UTI.  Urine cultures showed Escherichia coli greater than 100,000 colonies, pansensitive, switched oral Omnicef   5 days total.  9. elevated blood sugar Hemoglobin A1c of 6.2.  CBGs ranging from 105-165.  Diet controlled.   treated with sliding scale insulin.  Outpatient follow-up with PCP.         Discharge Exam:  Blood pressure 110/62, pulse 76, temperature 98.9 F (37.2 C), temperature source Oral, resp. rate (!) 21, height 5' 7"  (1.702 m), weight 66.1 kg (145 lb 11.6 oz), SpO2 99 %.  General exam: On 2 L nasal cannula Respiratory system: Bibasilar crackles noted.  No wheezing.  No rhonchi.   Cardiovascular system: Irregularly irregular.  Positive JVD.  No lower extremity edema.  No murmurs rubs or gallops. Gastrointestinal system: Abdomen is tender, nondistended, soft, positive bowel sounds.   Central nervous system: Alert and oriented x3.  Cranial nerves II through XII grossly intact. No focal neurological deficits. Extremities: Symmetric 5 x 5 power. Skin: No rashes, lesions or ulcers      Follow-up Information    [facility]  up.   Specialty:  [place] Why:  HHRN/PT/OT/aide/social work arranged- they will call you to resume [facility] services and set up home visits Contact information: [address] [place]  [place] [address] [place] Follow up.   Why:  3n1 arranged- to be delivered to room prior to discharge Contact information: [place] [address] [phone]        Josue Hector, MD. Call.   Specialty:  Cardiology Why:  To make follow-up appointment with cardiology in 3-5 days Contact information: [address] [place] [address] [phone]        Marcial Pacas, MD .   Specialty:  Plastic Surgery Contact information: [address] [place] [address] [phone]           Signed: Reyne Dumas 10/24/2017, 8:37 AM        Time spent >1 hour

## 2017-10-24 NOTE — Progress Notes (Addendum)
SATURATION QUALIFICATIONS: (This note is used to comply with regulatory documentation for home oxygen)  Patient Saturations on Room Air at Rest =86 %  Patient Saturations on 2 Liters of oxygen at rest= 99%  Patient Saturations on Room Air while Ambulating = N/A  Patient Saturations on 0 Liters of oxygen while Ambulating = N/A  Please briefly explain why patient needs home oxygen: Pt at 86% at rest on room air.

## 2017-10-24 NOTE — Care Management Note (Addendum)
Case Management Note Marvetta Gibbons RN, BSN Unit 4E-Case Manager [phone]  Patient Details  Name: Aaron Frye MRN: [id number] Date of Birth: [birth year]  Subjective/Objective: Pt admitted with acute on chronic resp. Failure-  Afib,  HF                  Action/Plan: PTA Pt lived at home- [facility] services- orders have been placed for HHRN/PT/OT/aide/se- DME order also placed for 3n1- will alert Tiffany with [facility] regarding [place] services- and also notify Jeneen Rinks with [facility] for DME needs prior to discharge.   Expected Discharge Date:  10/24/17               Expected Discharge Plan:  [place]  In-House Referral:  NA  Discharge planning Services  CM Consult  Post Acute Care Choice:  Durable Medical Equipment, Home Health, Resumption of Svcs/PTA Provider Choice offered to:  Patient  DME Arranged:  3-N-1, Walker rolling DME Agency:  [place]:  RN, Disease Management, PT, OT, Nurse's Aide, Social Work [employer] Agency:  [facility] [employer] [facility] [employer]  Status of Service:  Completed, signed off  If discussed at [employer], dates discussed:    Discharge Disposition: home/home health   Additional Comments:  10/24/17- 1000- [place] Souter RN, CM- pt for d/c home today with wife- orders have been placed for [facility] services- RN/PT/OT/aide/sw- notified Tiffany with [facility] for resumption of [place] services and added needs- DME orders also placed for RW and 3n1 have notified Butch Penny with [facility] for DME needs- RW and 3n1 to be delivered to room prior to discharge. Pt has home 02 at home baseline 2L with [place] with pt's daughter via TC- requesting portable concentrator for home with [facility]- states they have been trying to get a home assessment but have been unable to get order with PCP- will ask MD here if we can add to home 02 order to send to [facility]- MD agreeable to order- have  spoken with Caryl Pina at [facility]- will send new order and new 02 note in order to process for new portable home concentrator. Will have concentrator delivered to home.    Dawayne Patricia, RN 10/24/2017, 10:08 AM

## 2017-10-28 NOTE — Progress Notes (Signed)
Cardiology Office Note   Date:  10/31/2017   ID:  Aaron Frye, DOB [birth year], MRN [id number]  PCP:  Aaron Shire, MD  Cardiologist:   Aaron Rouge, MD   No chief complaint on file.     History of Present Illness: Aaron Frye is a 84 y.o. male who presents for post hospital f/u D/C 10/24/17 with acute respiratory failure. 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 BNP only 270 but Has severe MS with mean gradient 12 mmHg and moderate Aaron. EF 65% by last echo 07/18/17 Beta blocker increased and diuresed in hospital. ACE held for low BP. CXR showed small effusions with vascular congestion. Chronic anticoagulation with coumadin. Lasix 40 bid  On oxygen 2L's continuously Has had venous stasis with LE edema and previous Rx with UNA boot.   Doing well since d/c still on oxygen needs portable tank Pulse lower and LE edema better   Daughter from [place] who is paralegal with him today She and husband and Aaron Frye all went to [facility]    Past Medical History:  Diagnosis Date  . Atrial fibrillation ([place])    Persisent   . CVA (cerebral vascular accident) ([place])   . Femur fracture, right [facility]) March 2016  . HLD (hyperlipidemia)   . HTN (hypertension)   . Non-ischemic cardiomyopathy ([place])    LVEF=35-40% by echo 2009  . Prostate cancer ([place])   . TIA (transient ischemic attack)     Past Surgical History:  Procedure Laterality Date  . APPENDECTOMY    . INTRAMEDULLARY (IM) NAIL INTERTROCHANTERIC Right 09/24/2014   Procedure: INTRAMEDULLARY (IM) NAIL INTERTROCHANTRIC;  Surgeon: Rod Can, MD;  Location: [place];  Service: Orthopedics;  Laterality: Right;  . TONSILLECTOMY       Current Outpatient Medications  Medication Sig Dispense Refill  . acetaminophen (TYLENOL) 325 MG tablet Take 325 mg by mouth 2 (two) times daily as needed (for headaches).    [name] albuterol (PROVENTIL HFA;VENTOLIN HFA) 108 (90 Base) MCG/ACT inhaler Inhale 2 puffs into the lungs every  6 (six) hours as needed for wheezing or shortness of breath.     [name] atorvastatin (LIPITOR) 40 MG tablet TAKE ONE TABLET BY MOUTH ONCE DAILY 90 tablet 3  . furosemide (LASIX) 40 MG tablet Take 1 tablet (40 mg total) by mouth 2 (two) times daily. 60 tablet 1  . ipratropium-albuterol (DUONEB) 0.5-2.5 (3) MG/3ML SOLN Inhale 3 mLs into the lungs every 6 (six) hours as needed (for shortness of breath or wheezing).   0  . KLOR-CON M10 10 MEQ tablet TAKE 1 TABLET BY MOUTH TWICE DAILY 180 tablet 2  . loratadine (CLARITIN) 10 MG tablet Take 1 tablet (10 mg total) by mouth daily. 30 tablet 1  . metoprolol succinate (TOPROL-XL) 25 MG 24 hr tablet Take 3 tablets (75 mg total) by mouth daily. 30 tablet 2  . moxifloxacin (VIGAMOX) 0.5 % ophthalmic solution Place 1 drop into the right eye See admin instructions. Instill 1 drop into the right eye daily AS DIRECTED for 2 days after ocular injection every three months  12  . Multiple Vitamins-Minerals (ADULT ONE DAILY GUMMIES) CHEW Chew 1 tablet by mouth daily.    . OXYGEN Inhale 2 L into the lungs continuous.    Derrill Memo ON 11/07/2017] ramipril (ALTACE) 5 MG capsule Take 1 capsule (5 mg total) by mouth daily. 30 capsule 1  . saccharomyces boulardii (FLORASTOR) 250 MG capsule Take 1 capsule (250 mg  total) by mouth 2 (two) times daily. 60 capsule 1  . warfarin (COUMADIN) 5 MG tablet TAKE AS DIRECTED BY MOUTH BY COUMADIN CLINIC (Patient taking differently: Take 7.5 mg by mouth in the morning on Sun and 5 mg on Mon/Tues/Wed/Thurs/Fri/Sat) 40 tablet 3   No current facility-administered medications for this visit.     Allergies:   Penicillins    Social History:  The patient  reports that he has never smoked. He has never used smokeless tobacco. He reports that he does not drink alcohol or use drugs.   Family History:  The patient's family history includes Cancer in his brother; Heart attack in his brother; Heart failure in his mother; Hypertension in his son; Lung  disease in his father.    ROS:  Please see the history of present illness.   Otherwise, review of systems are positive for none.   All other systems are reviewed and negative.    PHYSICAL EXAM: VS:  BP 118/68   Pulse 76   Ht 5\' 7"  (1.702 m)   Wt 139 lb 8 oz (63.3 kg)   SpO2 97%   BMI 21.85 kg/m  , BMI Body mass index is 21.85 kg/m. Affect appropriate Kyphotic chronically ill white male with oxygen HEENT: normal Neck supple with no adenopathy JVP normal no bruits no thyromegaly Lungs clear with no wheezing and good diaphragmatic motion Heart:  S1/S2 Aaron murmur, no rub, gallop or click PMI normal Abdomen: benighn, BS positve, no tenderness, no AAA no bruit.  No HSM or HJR Distal pulses intact with no bruits Plus 2 bilateral edema with stasis  Neuro non-focal Skin warm and dry chronic UE bruising due to coumadin  No muscular weakness     EKG:  AFib nonspecific ST changes    Recent Labs: 10/19/2017: B Natriuretic Peptide 270.4 10/21/2017: Magnesium 2.3 10/23/2017: Hemoglobin 11.0; Platelets 222 10/24/2017: ALT 19; BUN 23; Creatinine, Ser 0.90; Potassium 4.0; Sodium 139    Lipid Panel    Component Value Date/Time   CHOL 97 09/24/2014 0507   TRIG 53 09/24/2014 0507   HDL 28 (L) 09/24/2014 0507   CHOLHDL 3.5 09/24/2014 0507   VLDL 11 09/24/2014 0507   LDLCALC 58 09/24/2014 0507      Wt Readings from Last 3 Encounters:  10/31/17 139 lb 8 oz (63.3 kg)  10/24/17 145 lb 11.6 oz (66.1 kg)  07/10/17 152 lb 8 oz (69.2 kg)      Other studies Reviewed: Additional studies/ records that were reviewed today include: D/c summary labs, CXR Echo 07/2017 .    ASSESSMENT AND PLAN:  1.  AFib:  Rate control improved on higher dose Toprol continue coumadin INR RX 2. MS/Aaron not a surgical candidate due to age Lasix and rate control to maximize diastolic filling time 3. COPD: chronic respiratory failure continue oxygen f/u CXR ordered 4. HLD:  Continue statin   Will get  BMET/BNP and CXR in 2 weeks to make sure any component of CHF has cleared     Current medicines are reviewed at length with the patient today.  The patient does not have concerns regarding medicines.  The following changes have been made:  no change  Labs/ tests ordered today include: CXR   Orders Placed This Encounter  Procedures  . DG Chest 2 View  . Basic metabolic panel  . Pro b natriuretic peptide (BNP)     Disposition:   FU with cardiology in 3 months      Signed,  Aaron Rouge, MD  10/31/2017 11:28 AM    [facility] [address], [place], [place]  [address] Phone: [phone]; Fax: [phone]

## 2017-10-31 ENCOUNTER — Other Ambulatory Visit: Payer: Self-pay | Admitting: Cardiovascular Disease

## 2017-10-31 ENCOUNTER — Ambulatory Visit (INDEPENDENT_AMBULATORY_CARE_PROVIDER_SITE_OTHER): Payer: Medicare Other | Admitting: Cardiovascular Disease

## 2017-10-31 ENCOUNTER — Encounter: Payer: Self-pay | Admitting: Cardiovascular Disease

## 2017-10-31 VITALS — BP 118/68 | HR 76 | Ht 67.0 in | Wt 139.5 lb

## 2017-10-31 DIAGNOSIS — I4819 Other persistent atrial fibrillation: Secondary | ICD-10-CM

## 2017-10-31 DIAGNOSIS — I5043 Acute on chronic combined systolic (congestive) and diastolic (congestive) heart failure: Secondary | ICD-10-CM

## 2017-10-31 DIAGNOSIS — I481 Persistent atrial fibrillation: Secondary | ICD-10-CM | POA: Diagnosis not present

## 2017-10-31 DIAGNOSIS — E782 Mixed hyperlipidemia: Secondary | ICD-10-CM | POA: Diagnosis not present

## 2017-10-31 DIAGNOSIS — I050 Rheumatic mitral stenosis: Secondary | ICD-10-CM | POA: Diagnosis not present

## 2017-10-31 MED ORDER — METOPROLOL SUCCINATE ER 25 MG PO TB24: 75.0000 mg | ORAL_TABLET | Freq: Every day | ORAL | 3 refills | Status: DC

## 2017-10-31 NOTE — Patient Instructions (Addendum)
Medication Instructions:  Your physician recommends that you continue on your current medications as directed. Please refer to the Current Medication list given to you today.  Labwork: Your physician recommends that you return for lab work in: 2 weeks for BNP and BMET  Testing/Procedures: A chest x-ray takes a picture of the organs and structures inside the chest, including the heart, lungs, and blood vessels. This test can show several things, including, whether the heart is enlarges; whether fluid is building up in the lungs; and whether pacemaker / defibrillator leads are still in place.  Follow-Up: Your physician wants you to follow-up in: 2 months with Dr. Johnsie Cancel.    If you need a refill on your cardiac medications before your next appointment, please call your pharmacy.

## 2017-11-01 ENCOUNTER — Ambulatory Visit (INDEPENDENT_AMBULATORY_CARE_PROVIDER_SITE_OTHER): Payer: Medicare Other | Admitting: *Deleted

## 2017-11-01 DIAGNOSIS — I4891 Unspecified atrial fibrillation: Secondary | ICD-10-CM | POA: Diagnosis not present

## 2017-11-01 DIAGNOSIS — G459 Transient cerebral ischemic attack, unspecified: Secondary | ICD-10-CM | POA: Diagnosis not present

## 2017-11-01 DIAGNOSIS — I482 Chronic atrial fibrillation, unspecified: Secondary | ICD-10-CM

## 2017-11-01 DIAGNOSIS — Z5181 Encounter for therapeutic drug level monitoring: Secondary | ICD-10-CM

## 2017-11-01 LAB — POCT INR: INR: 1.8

## 2017-11-01 NOTE — Patient Instructions (Signed)
Description   Today take 1.5 tablets then continue taking 1 tablet daily except 1.5 tablets on Sundays. Recheck INR in 2 weeks with Lab Appt.  Call us with any new medications or concerns [phone].

## 2017-11-03 ENCOUNTER — Telehealth: Payer: Self-pay | Admitting: Physician Assistant

## 2017-11-03 NOTE — Telephone Encounter (Signed)
[facility] called stating patient had a 7 lbs weight gain and lower extremity edema. He takes 40 mg lasix BID with 20 mEq potassium daily. He is not short of breath and denies orthopnea. I instructed to take an extra 40 mg lasix today with an extra 10 mEq potassium and to call the office tomorrow. May need an earlier appt. If he becomes short of breath, he should be evaluated in the ER. [facility] expressed understanding of the plan.

## 2017-11-15 ENCOUNTER — Ambulatory Visit
Admission: RE | Admit: 2017-11-15 | Discharge: 2017-11-15 | Disposition: A | Discharge status: Home / Self Care | Payer: Medicare Other | Source: Ambulatory Visit | Attending: Cardiovascular Disease | Admitting: Cardiovascular Disease

## 2017-11-15 ENCOUNTER — Other Ambulatory Visit: Payer: Medicare Other

## 2017-11-15 ENCOUNTER — Ambulatory Visit (INDEPENDENT_AMBULATORY_CARE_PROVIDER_SITE_OTHER): Payer: Medicare Other | Admitting: *Deleted

## 2017-11-15 DIAGNOSIS — G459 Transient cerebral ischemic attack, unspecified: Secondary | ICD-10-CM

## 2017-11-15 DIAGNOSIS — I5043 Acute on chronic combined systolic (congestive) and diastolic (congestive) heart failure: Secondary | ICD-10-CM

## 2017-11-15 DIAGNOSIS — I482 Chronic atrial fibrillation, unspecified: Secondary | ICD-10-CM

## 2017-11-15 DIAGNOSIS — I4891 Unspecified atrial fibrillation: Secondary | ICD-10-CM

## 2017-11-15 DIAGNOSIS — Z5181 Encounter for therapeutic drug level monitoring: Secondary | ICD-10-CM | POA: Diagnosis not present

## 2017-11-15 LAB — BASIC METABOLIC PANEL
BUN / CREAT RATIO: 24 (ref 10–24)
BUN: 23 mg/dL (ref 10–36)
CHLORIDE: 96 mmol/L (ref 96–106)
CO2: 27 mmol/L (ref 20–29)
Calcium: 9.2 mg/dL (ref 8.6–10.2)
Creatinine, Ser: 0.97 mg/dL (ref 0.76–1.27)
GFR calc Af Amer: 79 mL/min/{1.73_m2} (ref >59)
GFR calc non Af Amer: 68 mL/min/{1.73_m2} (ref >59)
GLUCOSE: 152 mg/dL — AB (ref 65–99)
Potassium: 4.4 mmol/L (ref 3.5–5.2)
SODIUM: 139 mmol/L (ref 134–144)

## 2017-11-15 LAB — PRO B NATRIURETIC PEPTIDE: NT-PRO BNP: 2481 pg/mL — AB (ref 0–486)

## 2017-11-15 LAB — POCT INR: INR: 3.4

## 2017-11-15 NOTE — Patient Instructions (Signed)
Description   Skip today's dose then continue taking 1 tablet daily except 1.5 tablets on Sundays. Recheck INR in 2 weeks.  Call us with any new medications or concerns [phone].

## 2017-11-18 ENCOUNTER — Telehealth: Payer: Self-pay

## 2017-11-18 MED ORDER — FUROSEMIDE 40 MG PO TABS: 60.0000 mg | ORAL_TABLET | Freq: Two times a day (BID) | ORAL | Status: DC

## 2017-11-18 MED ORDER — POTASSIUM CHLORIDE CRYS ER 10 MEQ PO TBCR: 20.0000 meq | EXTENDED_RELEASE_TABLET | Freq: Two times a day (BID) | ORAL | Status: DC

## 2017-11-18 NOTE — Telephone Encounter (Signed)
-----   Message from Josue Hector, MD sent at 11/15/2017  3:11 PM EDT ----- Still evidence CHF will need to see what BMET looks like to adjust meds

## 2017-11-18 NOTE — Telephone Encounter (Signed)
Called patient with medication changes. Per Dr. Johnsie Cancel, increase lasix to 60 bid and Kdur to 20 bid f/u in clinic with NP/PA next week. Patient verbalized understanding. Patient has appointment tomorrow with [place] PA. Updated patient's medication list.

## 2017-11-19 ENCOUNTER — Encounter: Payer: Self-pay | Admitting: Physician Assistant

## 2017-11-19 ENCOUNTER — Ambulatory Visit (INDEPENDENT_AMBULATORY_CARE_PROVIDER_SITE_OTHER): Payer: Medicare Other | Admitting: Physician Assistant

## 2017-11-19 VITALS — BP 100/66 | HR 63 | Ht 67.0 in | Wt 149.0 lb

## 2017-11-19 DIAGNOSIS — I050 Rheumatic mitral stenosis: Secondary | ICD-10-CM

## 2017-11-19 DIAGNOSIS — I481 Persistent atrial fibrillation: Secondary | ICD-10-CM | POA: Diagnosis not present

## 2017-11-19 DIAGNOSIS — I5033 Acute on chronic diastolic (congestive) heart failure: Secondary | ICD-10-CM | POA: Diagnosis not present

## 2017-11-19 DIAGNOSIS — I4819 Other persistent atrial fibrillation: Secondary | ICD-10-CM

## 2017-11-19 NOTE — Patient Instructions (Signed)
Medication Instructions:   Your physician recommends that you continue on your current medications as directed. Please refer to the Current Medication list given to you today.   If you need a refill on your cardiac medications before your next appointment, please call your pharmacy.  Labwork:  BNP BMP TODAY     Testing/Procedures:  NONE ORDERED  TODAY    Follow-Up:  NISHAN  AS SCHEUDLED   Any Other Special Instructions Will Be Listed Below (If Applicable).

## 2017-11-19 NOTE — Progress Notes (Signed)
Cardiology Office Note    Date:  11/19/2017   ID:  Aaron Frye, DOB [birth year], MRN [id number]  PCP:  Nickola Major, MD  Cardiologist:  Dr. Johnsie Cancel   Chief Complaint: weight gain   History of Present Illness:   Aaron Frye is a 84 y.o. 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   Admitted 09/2017 for acute on chronic respiratory failure due to acute CHF. Beta blocker increased due to tachycardia and diuresed. ACE held for low BP. Not a surgical candidate of mitral valve due to age.   He was doing well on cardiac stand point when seen by Dr. Johnsie Cancel 10/31/17. BNP 2481 on 5/17. Increase Llasix to 60 bid and Kdur to 20 bid.  He is feeling lost better on higher dose. No complains. Says "1000%" better. Walks with walker at home. Denies orthopnea, chest pain, palpitations, PND, syncope, melena or blood in his stool. Has mild edema. Elevates his legs. Here with church friend. Uses oxygen 2L 24/7.   Past Medical History:  Diagnosis Date  . Atrial fibrillation ([place])    Persisent   . CVA (cerebral vascular accident) ([place])   . Femur fracture, right [facility]) March 2016  . HLD (hyperlipidemia)   . HTN (hypertension)   . Mitral stenosis   . Non-ischemic cardiomyopathy ([place])    LVEF=35-40% by echo 2009  . Prostate cancer ([place])   . TIA (transient ischemic attack)     Past Surgical History:  Procedure Laterality Date  . APPENDECTOMY    . INTRAMEDULLARY (IM) NAIL INTERTROCHANTERIC Right 09/24/2014   Procedure: INTRAMEDULLARY (IM) NAIL INTERTROCHANTRIC;  Surgeon: Rod Can, MD;  Location: [place];  Service: Orthopedics;  Laterality: Right;  . TONSILLECTOMY      Current Medications: Prior to Admission medications   Medication Sig Start Date End Date Taking? Authorizing Provider  acetaminophen  (TYLENOL) 325 MG tablet Take 325 mg by mouth 2 (two) times daily as needed (for headaches).    [provider]  albuterol (PROVENTIL HFA;VENTOLIN HFA) 108 (90 Base) MCG/ACT inhaler Inhale 2 puffs into the lungs every 6 (six) hours as needed for wheezing or shortness of breath.  07/03/17   [provider]  atorvastatin (LIPITOR) 40 MG tablet TAKE ONE TABLET BY MOUTH ONCE DAILY 01/23/17   Josue Hector, MD  furosemide (LASIX) 40 MG tablet Take 1.5 tablets (60 mg total) by mouth 2 (two) times daily. 11/18/17   Josue Hector, MD  ipratropium-albuterol (DUONEB) 0.5-2.5 (3) MG/3ML SOLN Inhale 3 mLs into the lungs every 6 (six) hours as needed (for shortness of breath or wheezing).  07/25/17   [provider]  loratadine (CLARITIN) 10 MG tablet Take 1 tablet (10 mg total) by mouth daily. 10/08/14   Angiulli, Lavon Paganini, PA-C  metoprolol succinate (TOPROL-XL) 25 MG 24 hr tablet Take 3 tablets (75 mg total) by mouth daily. 10/31/17   Josue Hector, MD  moxifloxacin (VIGAMOX) 0.5 % ophthalmic solution Place 1 drop into the right eye See admin instructions. Instill 1 drop into the right eye daily AS DIRECTED for 2 days after ocular injection every three months 10/03/17   [provider]  Multiple Vitamins-Minerals (ADULT ONE DAILY GUMMIES) CHEW Chew 1 tablet by mouth daily.    [provider]  OXYGEN Inhale 2 L into the  lungs continuous.    [provider]  potassium chloride (KLOR-CON M10) 10 MEQ tablet Take 2 tablets (20 mEq total) by mouth 2 (two) times daily. 11/18/17   Josue Hector, MD  ramipril (ALTACE) 5 MG capsule Take 1 capsule (5 mg total) by mouth daily. 11/07/17   Reyne Dumas, MD  saccharomyces boulardii (FLORASTOR) 250 MG capsule Take 1 capsule (250 mg total) by mouth 2 (two) times daily. 10/08/14   Angiulli, Lavon Paganini, PA-C  warfarin (COUMADIN) 5 MG tablet TAKE AS DIRECTED BY MOUTH BY COUMADIN CLINIC 11/01/17   Josue Hector, MD    Allergies:    Penicillins   Social History   Socioeconomic History  . Marital status: Married    Spouse name: Not on file  . Number of children: Not on file  . Years of education: Not on file  . Highest education level: Not on file  Occupational History  . Occupation: Hospital doctor  Social Needs  . Financial resource strain: Not on file  . Food insecurity:    Worry: Not on file    Inability: Not on file  . Transportation needs:    Medical: Not on file    Non-medical: Not on file  Tobacco Use  . Smoking status: Never Smoker  . Smokeless tobacco: Never Used  Substance and Sexual Activity  . Alcohol use: No  . Drug use: No  . Sexual activity: Not on file  Lifestyle  . Physical activity:    Days per week: Not on file    Minutes per session: Not on file  . Stress: Not on file  Relationships  . Social connections:    Talks on phone: Not on file    Gets together: Not on file    Attends religious service: Not on file    Active member of club or organization: Not on file    Attends meetings of clubs or organizations: Not on file    Relationship status: Not on file  Other Topics Concern  . Not on file  Social History Narrative  . Not on file     Family History:  The patient's family history includes Cancer in his brother; Heart attack in his brother; Heart failure in his mother; Hypertension in his son; Lung disease in his father.   ROS:   Please see the history of present illness.    ROS All other systems reviewed and are negative.   PHYSICAL EXAM:   VS:  BP 100/66   Pulse 63   Ht 5\' 7"  (1.702 m)   Wt 149 lb (67.6 kg)   SpO2 97%   BMI 23.34 kg/m    GEN: frail elderly male  in no acute distress on oxygen  HEENT: normal  Neck: no JVD, carotid bruits, or masses Cardiac: Irregular; no murmurs, rubs, or gallops, Trace BL LE edema with chronic venous statis  Respiratory:  clear to auscultation bilaterally, normal work of breathing GI: soft, nontender, nondistended, + BS MS: no  deformity or atrophy  Skin: warm and dry, no rash Neuro:  Alert and Oriented x 3, Strength and sensation are intact Psych: euthymic mood, full affect  Wt Readings from Last 3 Encounters:  11/19/17 149 lb (67.6 kg)  10/31/17 139 lb 8 oz (63.3 kg)  10/24/17 145 lb 11.6 oz (66.1 kg)      Studies/Labs Reviewed:   EKG:  EKG is not ordered today.    Recent Labs: 10/19/2017: B Natriuretic Peptide 270.4 10/21/2017: Magnesium 2.3  10/23/2017: Hemoglobin 11.0; Platelets 222 10/24/2017: ALT 19 11/15/2017: BUN 23; Creatinine, Ser 0.97; NT-Pro BNP 2,481; Potassium 4.4; Sodium 139   Lipid Panel    Component Value Date/Time   CHOL 97 09/24/2014 0507   TRIG 53 09/24/2014 0507   HDL 28 (L) 09/24/2014 0507   CHOLHDL 3.5 09/24/2014 0507   VLDL 11 09/24/2014 0507   LDLCALC 58 09/24/2014 0507    Additional studies/ records that were reviewed today include:   Echo 07/18/17: Study Conclusions - Left ventricle: The cavity size was normal. Wall thickness was increased in a pattern of mild LVH. Systolic function was vigorous. The estimated ejection fraction was in the range of 65% to 70%. Wall motion was normal; there were no regional wall motion abnormalities. - Aortic valve: Moderately calcified annulus. Severely thickened, severely calcified leaflets. There was mild stenosis. - Mitral valve: Moderately dilated annulus. Severely calcified leaflets . The findings are consistent with severe stenosis. There was moderate regurgitation. Valve area by pressure half-time: 1.68 cm^2. Valve area by continuity equation (using LVOT flow): 0.7 cm^2. - Left atrium: The atrium was severely dilated. - Right ventricle: The cavity size was mildly dilated. Systolic function was mildly reduced. - Right atrium: The atrium was mildly to moderately dilated. - Tricuspid valve: There was moderate-severe regurgitation. - Pulmonary arteries: Systolic pressure was moderately to  severely increased. PA peak pressure: 60 mm Hg (S).    ASSESSMENT & PLAN:    1. Persistent atrial fibrillation - Rate controlled. Continue Coumadin and Toprol XL 75mg  qd.   2. Acute on chronic diastolic failure - [place]elling much better on higher dose of lasix. He has lost 3 lb on home scale. However weight of 149 today. It was 140 when seen by Dr. Johnsie Cancel  He has heavy cost and oxygen machine today. Will continue current dose of lasix 60mg  BId and Kdur 68meq BID. Check labs and adjust meds based on this.   3. Mitral valve disease -Last echo in 07/2017 showed moderate MR and severe MS. Not a surgical candidate due to age.     Medication Adjustments/Labs and Tests Ordered: Current medicines are reviewed at length with the patient today.  Concerns regarding medicines are outlined above.  Medication changes, Labs and Tests ordered today are listed in the Patient Instructions below. There are no Patient Instructions on file for this visit.   Jarrett Soho, [place]  11/19/2017 2:10 PM    [facility] [address], [place], [place]  [address] Phone: [phone]; Fax: [phone]

## 2017-11-20 LAB — BASIC METABOLIC PANEL
BUN / CREAT RATIO: 23 (ref 10–24)
BUN: 25 mg/dL (ref 10–36)
CHLORIDE: 99 mmol/L (ref 96–106)
CO2: 30 mmol/L — AB (ref 20–29)
Calcium: 9.3 mg/dL (ref 8.6–10.2)
Creatinine, Ser: 1.11 mg/dL (ref 0.76–1.27)
GFR calc Af Amer: 67 mL/min/{1.73_m2} (ref >59)
GFR calc non Af Amer: 58 mL/min/{1.73_m2} — ABNORMAL LOW (ref >59)
GLUCOSE: 124 mg/dL — AB (ref 65–99)
POTASSIUM: 4.4 mmol/L (ref 3.5–5.2)
SODIUM: 144 mmol/L (ref 134–144)

## 2017-11-20 LAB — PRO B NATRIURETIC PEPTIDE: NT-Pro BNP: 2887 pg/mL — ABNORMAL HIGH (ref 0–486)

## 2017-11-29 ENCOUNTER — Ambulatory Visit (INDEPENDENT_AMBULATORY_CARE_PROVIDER_SITE_OTHER): Payer: Medicare Other | Admitting: Pharmacist

## 2017-11-29 ENCOUNTER — Encounter (INDEPENDENT_AMBULATORY_CARE_PROVIDER_SITE_OTHER): Payer: Self-pay

## 2017-11-29 DIAGNOSIS — I482 Chronic atrial fibrillation, unspecified: Secondary | ICD-10-CM

## 2017-11-29 DIAGNOSIS — I4891 Unspecified atrial fibrillation: Secondary | ICD-10-CM | POA: Diagnosis not present

## 2017-11-29 DIAGNOSIS — Z5181 Encounter for therapeutic drug level monitoring: Secondary | ICD-10-CM

## 2017-11-29 DIAGNOSIS — G459 Transient cerebral ischemic attack, unspecified: Secondary | ICD-10-CM | POA: Diagnosis not present

## 2017-11-29 LAB — POCT INR: INR: 2.4 (ref 2.0–3.0)

## 2017-11-29 NOTE — Patient Instructions (Signed)
Description   Continue taking 1 tablet daily except 1.5 tablets on Sundays. Recheck INR in 4 weeks.  Call us with any new medications or concerns #[phone].      

## 2017-12-19 ENCOUNTER — Telehealth: Payer: Self-pay | Admitting: Cardiovascular Disease

## 2017-12-19 MED ORDER — FUROSEMIDE 20 MG PO TABS: 60.0000 mg | ORAL_TABLET | Freq: Two times a day (BID) | ORAL | 11 refills | Status: DC

## 2017-12-19 NOTE — Telephone Encounter (Signed)
NEW MESSAGE    Patient having trouble splitting the pills, daughter is requesting prescription for 40mg   AND for 20mg .    1. Which medications need to be refilled? (please list name of each medication and dose if known) furosemide (LASIX) 40 MG tablet  2. Which pharmacy/location (including street and city if local pharmacy) is medication to be sent to? [place], [place] - [address]  3. Do they need a 30 day or 90 day supply? [place]

## 2017-12-19 NOTE — Telephone Encounter (Signed)
Pt daughter called in requesting an Rx for furosemide 40 mg and furosemide 20 mg. Because pt is having trouble cutting the 40 mg pill in half and would like two different Rx sent to the pharmacy. Please address.

## 2017-12-19 NOTE — Telephone Encounter (Signed)
Sent prescription in for Lasix 20 mg (3 tablets to equal 60 mg) so patient will have one prescription and will not have to cut pills in half. Patient verbalized understanding.

## 2017-12-26 ENCOUNTER — Encounter (INDEPENDENT_AMBULATORY_CARE_PROVIDER_SITE_OTHER): Payer: Medicare Other | Admitting: Ophthalmology

## 2017-12-26 DIAGNOSIS — I10 Essential (primary) hypertension: Secondary | ICD-10-CM | POA: Diagnosis not present

## 2017-12-26 DIAGNOSIS — H35033 Hypertensive retinopathy, bilateral: Secondary | ICD-10-CM

## 2017-12-26 DIAGNOSIS — H35371 Puckering of macula, right eye: Secondary | ICD-10-CM | POA: Diagnosis not present

## 2017-12-26 DIAGNOSIS — H353211 Exudative age-related macular degeneration, right eye, with active choroidal neovascularization: Secondary | ICD-10-CM

## 2017-12-26 DIAGNOSIS — H353124 Nonexudative age-related macular degeneration, left eye, advanced atrophic with subfoveal involvement: Secondary | ICD-10-CM

## 2017-12-26 DIAGNOSIS — H43813 Vitreous degeneration, bilateral: Secondary | ICD-10-CM

## 2017-12-27 ENCOUNTER — Ambulatory Visit (INDEPENDENT_AMBULATORY_CARE_PROVIDER_SITE_OTHER): Payer: Medicare Other | Admitting: *Deleted

## 2017-12-27 DIAGNOSIS — G459 Transient cerebral ischemic attack, unspecified: Secondary | ICD-10-CM

## 2017-12-27 DIAGNOSIS — Z5181 Encounter for therapeutic drug level monitoring: Secondary | ICD-10-CM

## 2017-12-27 DIAGNOSIS — I4891 Unspecified atrial fibrillation: Secondary | ICD-10-CM

## 2017-12-27 DIAGNOSIS — I482 Chronic atrial fibrillation, unspecified: Secondary | ICD-10-CM

## 2017-12-27 LAB — POCT INR: INR: 2.1 (ref 2.0–3.0)

## 2017-12-27 NOTE — Patient Instructions (Signed)
Description   Continue taking 1 tablet daily except 1.5 tablets on Sundays. Recheck INR in 4 weeks.  Call us with any new medications or concerns [phone].

## 2018-01-10 ENCOUNTER — Telehealth: Payer: Self-pay

## 2018-01-10 MED ORDER — POTASSIUM CHLORIDE CRYS ER 20 MEQ PO TBCR: 20.0000 meq | EXTENDED_RELEASE_TABLET | Freq: Two times a day (BID) | ORAL | 3 refills | Status: DC

## 2018-01-10 NOTE — Telephone Encounter (Signed)
Patient needs refill on Potassium. Sent prescription into patient's pharmacy.

## 2018-01-24 ENCOUNTER — Ambulatory Visit (INDEPENDENT_AMBULATORY_CARE_PROVIDER_SITE_OTHER): Payer: Medicare Other | Admitting: *Deleted

## 2018-01-24 DIAGNOSIS — G459 Transient cerebral ischemic attack, unspecified: Secondary | ICD-10-CM

## 2018-01-24 DIAGNOSIS — I482 Chronic atrial fibrillation, unspecified: Secondary | ICD-10-CM

## 2018-01-24 DIAGNOSIS — I4891 Unspecified atrial fibrillation: Secondary | ICD-10-CM

## 2018-01-24 DIAGNOSIS — Z5181 Encounter for therapeutic drug level monitoring: Secondary | ICD-10-CM | POA: Diagnosis not present

## 2018-01-24 LAB — POCT INR: INR: 2.7 (ref 2.0–3.0)

## 2018-01-24 NOTE — Patient Instructions (Signed)
Description   Continue taking 1 tablet daily except 1.5 tablets on Sundays. Recheck INR in 6 weeks.  Call us with any new medications or concerns [phone].

## 2018-01-28 ENCOUNTER — Other Ambulatory Visit: Payer: Self-pay | Admitting: Cardiovascular Disease

## 2018-01-28 NOTE — Progress Notes (Signed)
Cardiology Office Note    Date:  01/31/2018   ID:  Aaron Frye, DOB June 11, 1925, MRN [id number]  PCP:  Nickola Major, MD  Cardiologist:  Dr. Johnsie Cancel   Chief Complaint: weight gain   History of Present Illness:   Aaron Frye is a 84 y.o. 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 Beta blocker increased due to tachycardia and diuresed. ACE held for low BP. Not a surgical candidate of mitral valve due to age.   10/31/17. BNP 2481. Increase Lasix to 60 bid and Kdur to 20 bid.  Feeling better since lasix adjusted  Uses oxygen 2L 24/7.   Had his grandson with him today who does landscaping  Thinks he may have some bronchitis Took some tylenol yesterday No fever , sputum or cough   Past Medical History:  Diagnosis Date  . Atrial fibrillation ([place])    Persisent   . CVA (cerebral vascular accident) ([place])   . Femur fracture, right [facility]) March 2016  . HLD (hyperlipidemia)   . HTN (hypertension)   . Mitral stenosis   . Non-ischemic cardiomyopathy ([place])    LVEF=35-40% by echo 2009  . Prostate cancer ([place])   . TIA (transient ischemic attack)     Past Surgical History:  Procedure Laterality Date  . APPENDECTOMY    . INTRAMEDULLARY (IM) NAIL INTERTROCHANTERIC Right 09/24/2014   Procedure: INTRAMEDULLARY (IM) NAIL INTERTROCHANTRIC;  Surgeon: Rod Can, MD;  Location: [facility];  Service: Orthopedics;  Laterality: Right;  . TONSILLECTOMY      Current Medications: Prior to Admission medications   Medication Sig Start Date End Date Taking? Authorizing Provider  acetaminophen (TYLENOL) 325 MG tablet Take 325 mg by mouth 2 (two) times daily as needed (for headaches).    [provider]  albuterol (PROVENTIL HFA;VENTOLIN HFA) 108  (90 Base) MCG/ACT inhaler Inhale 2 puffs into the lungs every 6 (six) hours as needed for wheezing or shortness of breath.  07/03/17   [provider]  atorvastatin (LIPITOR) 40 MG tablet TAKE ONE TABLET BY MOUTH ONCE DAILY 01/23/17   Josue Hector, MD  furosemide (LASIX) 40 MG tablet Take 1.5 tablets (60 mg total) by mouth 2 (two) times daily. 11/18/17   Josue Hector, MD  ipratropium-albuterol (DUONEB) 0.5-2.5 (3) MG/3ML SOLN Inhale 3 mLs into the lungs every 6 (six) hours as needed (for shortness of breath or wheezing).  07/25/17   [provider]  loratadine (CLARITIN) 10 MG tablet Take 1 tablet (10 mg total) by mouth daily. 10/08/14   Angiulli, Lavon Paganini, PA-C  metoprolol succinate (TOPROL-XL) 25 MG 24 hr tablet Take 3 tablets (75 mg total) by mouth daily. 10/31/17   Josue Hector, MD  moxifloxacin (VIGAMOX) 0.5 % ophthalmic solution Place 1 drop into the right eye See admin instructions. Instill 1 drop into the right eye daily AS DIRECTED for 2 days after ocular injection every three months 10/03/17   [provider]  Multiple Vitamins-Minerals (ADULT ONE DAILY GUMMIES) CHEW Chew 1 tablet by mouth daily.    [provider]  OXYGEN Inhale 2 L into the lungs continuous.    [provider]  potassium chloride (KLOR-CON M10) 10 MEQ tablet Take 2 tablets (20 mEq total) by mouth 2 (two)  times daily. 11/18/17   Josue Hector, MD  ramipril (ALTACE) 5 MG capsule Take 1 capsule (5 mg total) by mouth daily. 11/07/17   Reyne Dumas, MD  saccharomyces boulardii (FLORASTOR) 250 MG capsule Take 1 capsule (250 mg total) by mouth 2 (two) times daily. 10/08/14   Angiulli, Lavon Paganini, PA-C  warfarin (COUMADIN) 5 MG tablet TAKE AS DIRECTED BY MOUTH BY COUMADIN CLINIC 11/01/17   Josue Hector, MD    Allergies:   Penicillins   Social History   Socioeconomic History  . Marital status: Married    Spouse name: Not on file  . Number of children: Not on file  . Years of  education: Not on file  . Highest education level: Not on file  Occupational History  . Occupation: Hospital doctor  Social Needs  . Financial resource strain: Not on file  . Food insecurity:    Worry: Not on file    Inability: Not on file  . Transportation needs:    Medical: Not on file    Non-medical: Not on file  Tobacco Use  . Smoking status: Never Smoker  . Smokeless tobacco: Never Used  Substance and Sexual Activity  . Alcohol use: No  . Drug use: No  . Sexual activity: Not on file  Lifestyle  . Physical activity:    Days per week: Not on file    Minutes per session: Not on file  . Stress: Not on file  Relationships  . Social connections:    Talks on phone: Not on file    Gets together: Not on file    Attends religious service: Not on file    Active member of club or organization: Not on file    Attends meetings of clubs or organizations: Not on file    Relationship status: Not on file  Other Topics Concern  . Not on file  Social History Narrative  . Not on file     Family History:  The patient's family history includes Cancer in his brother; Heart attack in his brother; Heart failure in his mother; Hypertension in his son; Lung disease in his father.   ROS:   Please see the history of present illness.    ROS All other systems reviewed and are negative.   PHYSICAL EXAM:   VS:  BP 124/78   Pulse 71   Ht 5\' 7"  (1.702 m)   Wt 144 lb (65.3 kg)   SpO2 97%   BMI 22.55 kg/m    Affect appropriate Kyphotic elderly male  HEENT: normal Neck supple with no adenopathy JVP normal no bruits no thyromegaly Lungs clear with no wheezing and good diaphragmatic motion Heart:  S1/S2 no murmur, no rub, gallop or click PMI normal Abdomen: benighn, BS positve, no tenderness, no AAA no bruit.  No HSM or HJR Distal pulses intact with no bruits Plus 1 bilateral edema with brawny changes  Neuro non-focal Skin warm and dry No muscular weakness   Wt Readings from Last 3  Encounters:  01/31/18 144 lb (65.3 kg)  11/19/17 149 lb (67.6 kg)  10/31/17 139 lb 8 oz (63.3 kg)      Studies/Labs Reviewed:   EKG:    Nov 14, 2017 afib rate 71 inferior and antero alteral ST changes   Recent Labs: 10/19/2017: B Natriuretic Peptide 270.4 10/21/2017: Magnesium 2.3 14-Nov-2017: Hemoglobin 11.0; Platelets 222 10/24/2017: ALT 19 11/19/2017: BUN 25; Creatinine, Ser 1.11; NT-Pro BNP 2,887; Potassium 4.4; Sodium 144   Lipid Panel  Component Value Date/Time   CHOL 97 09/24/2014 0507   TRIG 53 09/24/2014 0507   HDL 28 (L) 09/24/2014 0507   CHOLHDL 3.5 09/24/2014 0507   VLDL 11 09/24/2014 0507   LDLCALC 58 09/24/2014 0507    Additional studies/ records that were reviewed today include:   Echo 07/18/17: Study Conclusions - Left ventricle: The cavity size was normal. Wall thickness was increased in a pattern of mild LVH. Systolic function was vigorous. The estimated ejection fraction was in the range of 65% to 70%. Wall motion was normal; there were no regional wall motion abnormalities. - Aortic valve: Moderately calcified annulus. Severely thickened, severely calcified leaflets. There was mild stenosis. - Mitral valve: Moderately dilated annulus. Severely calcified leaflets . The findings are consistent with severe stenosis. There was moderate regurgitation. Valve area by pressure half-time: 1.68 cm^2. Valve area by continuity equation (using LVOT flow): 0.7 cm^2. - Left atrium: The atrium was severely dilated. - Right ventricle: The cavity size was mildly dilated. Systolic function was mildly reduced. - Right atrium: The atrium was mildly to moderately dilated. - Tricuspid valve: There was moderate-severe regurgitation. - Pulmonary arteries: Systolic pressure was moderately to severely increased. PA peak pressure: 60 mm Hg (S).    ASSESSMENT & PLAN:    1. Persistent atrial fibrillation - good rate control and anticoagulation   2. Acute  on chronic diastolic failure - better on higher dose lasix continue   3. Mitral valve disease -Last echo in 07/2017 showed moderate MR and severe MS. Not a surgical candidate due to age.  Mean gradient 12 mmHg with mild AS as well Continue diuretic and beta blocker to lower LA pressure And maximize diastolic filling time    Medication Adjustments/Labs and Tests Ordered: Current medicines are reviewed at length with the patient today.  Concerns regarding medicines are outlined above.  Medication changes, Labs and Tests ordered today are listed in the Patient Instructions below. Patient Instructions  Medication Instructions:  Your physician recommends that you continue on your current medications as directed. Please refer to the Current Medication list given to you today.  Labwork: NONE  Testing/Procedures: NONE  Follow-Up: Your physician wants you to follow-up in: 3 months with Dr. Johnsie Cancel.    If you need a refill on your cardiac medications before your next appointment, please call your pharmacy.       Signed, Jenkins Rouge, MD  01/31/2018 11:42 AM    [facility] [place], [place], [place]  [address] Phone: [phone]; Fax: [phone]

## 2018-01-31 ENCOUNTER — Encounter: Payer: Self-pay | Admitting: Cardiovascular Disease

## 2018-01-31 ENCOUNTER — Ambulatory Visit (INDEPENDENT_AMBULATORY_CARE_PROVIDER_SITE_OTHER): Payer: Medicare Other | Admitting: Cardiovascular Disease

## 2018-01-31 VITALS — BP 124/78 | HR 71 | Ht 67.0 in | Wt 144.0 lb

## 2018-01-31 DIAGNOSIS — I482 Chronic atrial fibrillation, unspecified: Secondary | ICD-10-CM

## 2018-01-31 MED ORDER — POTASSIUM CHLORIDE CRYS ER 20 MEQ PO TBCR: 40.0000 meq | EXTENDED_RELEASE_TABLET | Freq: Two times a day (BID) | ORAL | 3 refills | Status: DC

## 2018-01-31 NOTE — Patient Instructions (Signed)
Medication Instructions:  Your physician recommends that you continue on your current medications as directed. Please refer to the Current Medication list given to you today.  Labwork: NONE  Testing/Procedures: NONE  Follow-Up: Your physician wants you to follow-up in: 3 months with Dr. Nishan.   If you need a refill on your cardiac medications before your next appointment, please call your pharmacy.    

## 2018-03-07 ENCOUNTER — Ambulatory Visit (INDEPENDENT_AMBULATORY_CARE_PROVIDER_SITE_OTHER): Payer: Medicare Other | Admitting: *Deleted

## 2018-03-07 DIAGNOSIS — I482 Chronic atrial fibrillation, unspecified: Secondary | ICD-10-CM

## 2018-03-07 DIAGNOSIS — I4891 Unspecified atrial fibrillation: Secondary | ICD-10-CM | POA: Diagnosis not present

## 2018-03-07 DIAGNOSIS — Z5181 Encounter for therapeutic drug level monitoring: Secondary | ICD-10-CM | POA: Diagnosis not present

## 2018-03-07 DIAGNOSIS — G459 Transient cerebral ischemic attack, unspecified: Secondary | ICD-10-CM

## 2018-03-07 LAB — POCT INR: INR: 3.9 — AB (ref 2.0–3.0)

## 2018-03-07 NOTE — Patient Instructions (Signed)
Description   Skip today's dose, then Continue taking 1 tablet daily except 1.5 tablets on Sundays. Recheck INR in 2 weeks.  Call us with any new medications or concerns [phone].

## 2018-03-19 ENCOUNTER — Encounter (INDEPENDENT_AMBULATORY_CARE_PROVIDER_SITE_OTHER): Payer: Medicare Other | Admitting: Ophthalmology

## 2018-03-19 DIAGNOSIS — H353211 Exudative age-related macular degeneration, right eye, with active choroidal neovascularization: Secondary | ICD-10-CM | POA: Diagnosis not present

## 2018-03-19 DIAGNOSIS — H35033 Hypertensive retinopathy, bilateral: Secondary | ICD-10-CM | POA: Diagnosis not present

## 2018-03-19 DIAGNOSIS — H43813 Vitreous degeneration, bilateral: Secondary | ICD-10-CM

## 2018-03-19 DIAGNOSIS — I10 Essential (primary) hypertension: Secondary | ICD-10-CM | POA: Diagnosis not present

## 2018-03-19 DIAGNOSIS — H353122 Nonexudative age-related macular degeneration, left eye, intermediate dry stage: Secondary | ICD-10-CM | POA: Diagnosis not present

## 2018-03-21 ENCOUNTER — Ambulatory Visit (INDEPENDENT_AMBULATORY_CARE_PROVIDER_SITE_OTHER): Payer: Medicare Other | Admitting: Pharmacist

## 2018-03-21 DIAGNOSIS — Z5181 Encounter for therapeutic drug level monitoring: Secondary | ICD-10-CM

## 2018-03-21 DIAGNOSIS — G459 Transient cerebral ischemic attack, unspecified: Secondary | ICD-10-CM

## 2018-03-21 DIAGNOSIS — I4891 Unspecified atrial fibrillation: Secondary | ICD-10-CM | POA: Diagnosis not present

## 2018-03-21 DIAGNOSIS — I482 Chronic atrial fibrillation, unspecified: Secondary | ICD-10-CM

## 2018-03-21 LAB — POCT INR: INR: 2.6 (ref 2.0–3.0)

## 2018-03-21 NOTE — Patient Instructions (Signed)
Description   Continue taking 1 tablet daily except 1.5 tablets on Sundays. Recheck INR in 5 weeks.  Call us with any new medications or concerns [phone].

## 2018-03-29 ENCOUNTER — Other Ambulatory Visit: Payer: Self-pay | Admitting: Cardiovascular Disease

## 2018-04-25 ENCOUNTER — Ambulatory Visit (INDEPENDENT_AMBULATORY_CARE_PROVIDER_SITE_OTHER): Payer: Medicare Other | Admitting: *Deleted

## 2018-04-25 DIAGNOSIS — I482 Chronic atrial fibrillation, unspecified: Secondary | ICD-10-CM

## 2018-04-25 DIAGNOSIS — G459 Transient cerebral ischemic attack, unspecified: Secondary | ICD-10-CM

## 2018-04-25 DIAGNOSIS — Z5181 Encounter for therapeutic drug level monitoring: Secondary | ICD-10-CM | POA: Diagnosis not present

## 2018-04-25 DIAGNOSIS — I4891 Unspecified atrial fibrillation: Secondary | ICD-10-CM

## 2018-04-25 LAB — POCT INR: INR: 3.7 — AB (ref 2.0–3.0)

## 2018-04-25 NOTE — Patient Instructions (Signed)
Description   Do not take any Coumadin today then continue taking 1 tablet daily except 1.5 tablets on Sundays. Recheck INR in 3 weeks with MD Appt.  Call us with any new medications or concerns [phone].

## 2018-05-05 NOTE — Progress Notes (Signed)
Cardiology Office Note    Date:  05/14/2018   ID:  Aaron Frye, DOB 1925/03/11, MRN [id number]  PCP:  Nickola Major, MD  Cardiologist:  Dr. Johnsie Cancel   Chief Complaint: weight gain   History of Present Illness:   Aaron Frye is a 84 y.o. 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 Beta blocker increased due to tachycardia and diuresed. ACE held for low BP. Not a surgical candidate of mitral valve due to age.   10/31/17. BNP 2481. Increase Lasix to 60 bid and Kdur to 20 bid.  Feeling better since lasix adjusted  Uses oxygen 2L 24/7.   Had his grandson with him today who does landscaping and pressure washing   Dyspnea only with exertion does not think he needs oxygen as sats always over 90% but at rest  Past Medical History:  Diagnosis Date  . Atrial fibrillation ([place])    Persisent   . CVA (cerebral vascular accident) ([place])   . Femur fracture, right [facility]) March 2016  . HLD (hyperlipidemia)   . HTN (hypertension)   . Mitral stenosis   . Non-ischemic cardiomyopathy ([place])    LVEF=35-40% by echo 2009  . Prostate cancer ([place])   . TIA (transient ischemic attack)     Past Surgical History:  Procedure Laterality Date  . APPENDECTOMY    . INTRAMEDULLARY (IM) NAIL INTERTROCHANTERIC Right 09/24/2014   Procedure: INTRAMEDULLARY (IM) NAIL INTERTROCHANTRIC;  Surgeon: Rod Can, MD;  Location: [facility];  Service: Orthopedics;  Laterality: Right;  . TONSILLECTOMY      Current Medications: Prior to Admission medications   Medication Sig Start Date End Date Taking? Authorizing Provider  acetaminophen (TYLENOL) 325 MG tablet Take 325 mg by mouth 2 (two) times daily as needed (for headaches).    [provider]  albuterol  (PROVENTIL HFA;VENTOLIN HFA) 108 (90 Base) MCG/ACT inhaler Inhale 2 puffs into the lungs every 6 (six) hours as needed for wheezing or shortness of breath.  07/03/17   [provider]  atorvastatin (LIPITOR) 40 MG tablet TAKE ONE TABLET BY MOUTH ONCE DAILY 01/23/17   Josue Hector, MD  furosemide (LASIX) 40 MG tablet Take 1.5 tablets (60 mg total) by mouth 2 (two) times daily. 11/18/17   Josue Hector, MD  ipratropium-albuterol (DUONEB) 0.5-2.5 (3) MG/3ML SOLN Inhale 3 mLs into the lungs every 6 (six) hours as needed (for shortness of breath or wheezing).  07/25/17   [provider]  loratadine (CLARITIN) 10 MG tablet Take 1 tablet (10 mg total) by mouth daily. 10/08/14   Angiulli, Lavon Paganini, PA-C  metoprolol succinate (TOPROL-XL) 25 MG 24 hr tablet Take 3 tablets (75 mg total) by mouth daily. 10/31/17   Josue Hector, MD  moxifloxacin (VIGAMOX) 0.5 % ophthalmic solution Place 1 drop into the right eye See admin instructions. Instill 1 drop into the right eye daily AS DIRECTED for 2 days after ocular injection every three months 10/03/17   [provider]  Multiple Vitamins-Minerals (ADULT ONE DAILY GUMMIES) CHEW Chew 1 tablet by mouth daily.    [provider]  OXYGEN Inhale 2 L into the lungs continuous.    [provider]  potassium chloride (KLOR-CON M10) 10 MEQ tablet Take 2 tablets (20 mEq  total) by mouth 2 (two) times daily. 11/18/17   Josue Hector, MD  ramipril (ALTACE) 5 MG capsule Take 1 capsule (5 mg total) by mouth daily. 11/07/17   Reyne Dumas, MD  saccharomyces boulardii (FLORASTOR) 250 MG capsule Take 1 capsule (250 mg total) by mouth 2 (two) times daily. 10/08/14   Angiulli, Lavon Paganini, PA-C  warfarin (COUMADIN) 5 MG tablet TAKE AS DIRECTED BY MOUTH BY COUMADIN CLINIC 11/01/17   Josue Hector, MD    Allergies:   Penicillins   Social History   Socioeconomic History  . Marital status: Married    Spouse name: Not on file  . Number of children:  Not on file  . Years of education: Not on file  . Highest education level: Not on file  Occupational History  . Occupation: Hospital doctor  Social Needs  . Financial resource strain: Not on file  . Food insecurity:    Worry: Not on file    Inability: Not on file  . Transportation needs:    Medical: Not on file    Non-medical: Not on file  Tobacco Use  . Smoking status: Never Smoker  . Smokeless tobacco: Never Used  Substance and Sexual Activity  . Alcohol use: No  . Drug use: No  . Sexual activity: Not on file  Lifestyle  . Physical activity:    Days per week: Not on file    Minutes per session: Not on file  . Stress: Not on file  Relationships  . Social connections:    Talks on phone: Not on file    Gets together: Not on file    Attends religious service: Not on file    Active member of club or organization: Not on file    Attends meetings of clubs or organizations: Not on file    Relationship status: Not on file  Other Topics Concern  . Not on file  Social History Narrative  . Not on file     Family History:  The patient's family history includes Cancer in his brother; Heart attack in his brother; Heart failure in his mother; Hypertension in his son; Lung disease in his father.   ROS:   Please see the history of present illness.    ROS All other systems reviewed and are negative.   PHYSICAL EXAM:   VS:  BP 112/68   Pulse 83   Ht 5\' 7"  (1.702 m)   Wt 146 lb 8 oz (66.5 kg)   SpO2 97%   BMI 22.95 kg/m   Filed Weights   05/14/18 1051  Weight: 146 lb 8 oz (66.5 kg)   Affect appropriate Elderly kyphotic white male  HEENT: normal Neck supple with no adenopathy JVP normal no bruits no thyromegaly Lungs clear with no wheezing and good diaphragmatic motion Heart:  S1/S2 no murmur, no rub, gallop or click PMI normal Abdomen: benighn, BS positve, no tenderness, no AAA no bruit.  No HSM or HJR Distal pulses intact with no bruits Plus 2 brawny edema chronic    Neuro non-focal Skin warm and dry No muscular weakness    Wt Readings from Last 3 Encounters:  05/14/18 146 lb 8 oz (66.5 kg)  01/31/18 144 lb (65.3 kg)  11/19/17 149 lb (67.6 kg)      Studies/Labs Reviewed:   EKG:    11/05/2017 afib rate 71 inferior and antero alteral ST changes   Recent Labs: 10/19/2017: B Natriuretic Peptide 270.4 10/21/2017: Magnesium 2.3 2017-11-05: Hemoglobin 11.0;  Platelets 222 10/24/2017: ALT 19 11/19/2017: BUN 25; Creatinine, Ser 1.11; NT-Pro BNP 2,887; Potassium 4.4; Sodium 144   Lipid Panel    Component Value Date/Time   CHOL 97 09/24/2014 0507   TRIG 53 09/24/2014 0507   HDL 28 (L) 09/24/2014 0507   CHOLHDL 3.5 09/24/2014 0507   VLDL 11 09/24/2014 0507   LDLCALC 58 09/24/2014 0507    Additional studies/ records that were reviewed today include:   Echo 07/18/17: Study Conclusions - Left ventricle: The cavity size was normal. Wall thickness was increased in a pattern of mild LVH. Systolic function was vigorous. The estimated ejection fraction was in the range of 65% to 70%. Wall motion was normal; there were no regional wall motion abnormalities. - Aortic valve: Moderately calcified annulus. Severely thickened, severely calcified leaflets. There was mild stenosis. - Mitral valve: Moderately dilated annulus. Severely calcified leaflets . The findings are consistent with severe stenosis. There was moderate regurgitation. Valve area by pressure half-time: 1.68 cm^2. Valve area by continuity equation (using LVOT flow): 0.7 cm^2. - Left atrium: The atrium was severely dilated. - Right ventricle: The cavity size was mildly dilated. Systolic function was mildly reduced. - Right atrium: The atrium was mildly to moderately dilated. - Tricuspid valve: There was moderate-severe regurgitation. - Pulmonary arteries: Systolic pressure was moderately to severely increased. PA peak pressure: 60 mm Hg (S).    ASSESSMENT & PLAN:     1. Persistent atrial fibrillation- good rate control and anticoagulation no bleeding issues no falls   2. Acute on chronic diastolic failure- better with higher dose lasix   3. Mitral valve disease -Last echo in 07/2017 showed moderate MR and severe MS. Not a surgical candidate due to age.  Mean gradient 12 mmHg with mild AS as well Continue diuretic and beta blocker to lower LA pressure And maximize diastolic filling time    Medication Adjustments/Labs and Tests Ordered: Current medicines are reviewed at length with the patient today.  Concerns regarding medicines are outlined above.  Medication changes, Labs and Tests ordered today are listed in the Patient Instructions below. There are no Patient Instructions on file for this visit.   Signed, Jenkins Rouge, MD  05/14/2018 11:04 AM    [facility] [place], [place], [place]  [address] Phone: [phone]; Fax: [phone]

## 2018-05-14 ENCOUNTER — Encounter: Payer: Self-pay | Admitting: Cardiovascular Disease

## 2018-05-14 ENCOUNTER — Ambulatory Visit: Payer: Medicare Other | Admitting: Cardiovascular Disease

## 2018-05-14 ENCOUNTER — Ambulatory Visit (INDEPENDENT_AMBULATORY_CARE_PROVIDER_SITE_OTHER): Payer: Medicare Other

## 2018-05-14 ENCOUNTER — Ambulatory Visit (INDEPENDENT_AMBULATORY_CARE_PROVIDER_SITE_OTHER): Payer: Medicare Other | Admitting: Cardiovascular Disease

## 2018-05-14 VITALS — BP 112/68 | HR 83 | Ht 67.0 in | Wt 146.5 lb

## 2018-05-14 DIAGNOSIS — I050 Rheumatic mitral stenosis: Secondary | ICD-10-CM | POA: Diagnosis not present

## 2018-05-14 DIAGNOSIS — I482 Chronic atrial fibrillation, unspecified: Secondary | ICD-10-CM

## 2018-05-14 DIAGNOSIS — I5033 Acute on chronic diastolic (congestive) heart failure: Secondary | ICD-10-CM

## 2018-05-14 DIAGNOSIS — I4819 Other persistent atrial fibrillation: Secondary | ICD-10-CM

## 2018-05-14 DIAGNOSIS — Z5181 Encounter for therapeutic drug level monitoring: Secondary | ICD-10-CM | POA: Diagnosis not present

## 2018-05-14 DIAGNOSIS — G459 Transient cerebral ischemic attack, unspecified: Secondary | ICD-10-CM

## 2018-05-14 DIAGNOSIS — I4891 Unspecified atrial fibrillation: Secondary | ICD-10-CM

## 2018-05-14 LAB — POCT INR: INR: 3.8 — AB (ref 2.0–3.0)

## 2018-05-14 NOTE — Patient Instructions (Signed)
Description   Do not take any Coumadin today then start taking 1 tablet daily. Recheck INR in 2 weeks.  Call us with any new medications or concerns [phone].

## 2018-05-14 NOTE — Patient Instructions (Signed)

## 2018-05-28 ENCOUNTER — Ambulatory Visit (INDEPENDENT_AMBULATORY_CARE_PROVIDER_SITE_OTHER): Payer: Medicare Other | Admitting: *Deleted

## 2018-05-28 DIAGNOSIS — I482 Chronic atrial fibrillation, unspecified: Secondary | ICD-10-CM | POA: Diagnosis not present

## 2018-05-28 DIAGNOSIS — I4891 Unspecified atrial fibrillation: Secondary | ICD-10-CM

## 2018-05-28 DIAGNOSIS — G459 Transient cerebral ischemic attack, unspecified: Secondary | ICD-10-CM | POA: Diagnosis not present

## 2018-05-28 DIAGNOSIS — Z5181 Encounter for therapeutic drug level monitoring: Secondary | ICD-10-CM | POA: Diagnosis not present

## 2018-05-28 LAB — POCT INR: INR: 3.7 — AB (ref 2.0–3.0)

## 2018-05-28 NOTE — Patient Instructions (Signed)
Description   Do not take any Coumadin today then start taking 1 tablet daily except 1/2 tablet on Tuesdays and Saturdays.  Recheck INR in 2 weeks.  Call us with any new medications or concerns [phone].

## 2018-06-02 ENCOUNTER — Other Ambulatory Visit: Payer: Self-pay | Admitting: Cardiovascular Disease

## 2018-06-02 MED ORDER — POTASSIUM CHLORIDE CRYS ER 20 MEQ PO TBCR: 40.0000 meq | EXTENDED_RELEASE_TABLET | Freq: Two times a day (BID) | ORAL | 3 refills | Status: DC

## 2018-06-11 ENCOUNTER — Encounter (INDEPENDENT_AMBULATORY_CARE_PROVIDER_SITE_OTHER): Payer: Medicare Other | Admitting: Ophthalmology

## 2018-06-11 DIAGNOSIS — I10 Essential (primary) hypertension: Secondary | ICD-10-CM

## 2018-06-11 DIAGNOSIS — H353211 Exudative age-related macular degeneration, right eye, with active choroidal neovascularization: Secondary | ICD-10-CM

## 2018-06-11 DIAGNOSIS — H35033 Hypertensive retinopathy, bilateral: Secondary | ICD-10-CM

## 2018-06-11 DIAGNOSIS — H353122 Nonexudative age-related macular degeneration, left eye, intermediate dry stage: Secondary | ICD-10-CM

## 2018-06-11 DIAGNOSIS — H43813 Vitreous degeneration, bilateral: Secondary | ICD-10-CM

## 2018-06-11 DIAGNOSIS — H35371 Puckering of macula, right eye: Secondary | ICD-10-CM

## 2018-06-13 ENCOUNTER — Ambulatory Visit (INDEPENDENT_AMBULATORY_CARE_PROVIDER_SITE_OTHER): Payer: Medicare Other | Admitting: *Deleted

## 2018-06-13 DIAGNOSIS — I4891 Unspecified atrial fibrillation: Secondary | ICD-10-CM

## 2018-06-13 DIAGNOSIS — G459 Transient cerebral ischemic attack, unspecified: Secondary | ICD-10-CM | POA: Diagnosis not present

## 2018-06-13 DIAGNOSIS — I482 Chronic atrial fibrillation, unspecified: Secondary | ICD-10-CM | POA: Diagnosis not present

## 2018-06-13 DIAGNOSIS — Z5181 Encounter for therapeutic drug level monitoring: Secondary | ICD-10-CM

## 2018-06-13 LAB — POCT INR: INR: 3.5 — AB (ref 2.0–3.0)

## 2018-06-13 NOTE — Patient Instructions (Signed)
Description   Do not take any Coumadin today then start taking 1 tablet daily except 1/2 tablet on Tuesdays, Thursdays, and Saturdays.  Recheck INR in 3 weeks per pt as he will be out of town.  Call us with any new medications or concerns [phone].

## 2018-07-08 ENCOUNTER — Ambulatory Visit (INDEPENDENT_AMBULATORY_CARE_PROVIDER_SITE_OTHER): Payer: Medicare Other

## 2018-07-08 DIAGNOSIS — I482 Chronic atrial fibrillation, unspecified: Secondary | ICD-10-CM

## 2018-07-08 DIAGNOSIS — I4891 Unspecified atrial fibrillation: Secondary | ICD-10-CM

## 2018-07-08 DIAGNOSIS — Z5181 Encounter for therapeutic drug level monitoring: Secondary | ICD-10-CM | POA: Diagnosis not present

## 2018-07-08 DIAGNOSIS — G459 Transient cerebral ischemic attack, unspecified: Secondary | ICD-10-CM | POA: Diagnosis not present

## 2018-07-08 LAB — POCT INR: INR: 3.7 — AB (ref 2.0–3.0)

## 2018-07-08 NOTE — Patient Instructions (Signed)
Description   Do not take any Coumadin today then start taking 1/2 tablet daily except 1 tablet on Mondays, Wednesdays, and Fridays.  Recheck INR in 2 weeks.  Call us with any new medications or concerns [phone].

## 2018-07-21 ENCOUNTER — Ambulatory Visit (INDEPENDENT_AMBULATORY_CARE_PROVIDER_SITE_OTHER): Payer: Medicare Other | Admitting: *Deleted

## 2018-07-21 DIAGNOSIS — Z5181 Encounter for therapeutic drug level monitoring: Secondary | ICD-10-CM

## 2018-07-21 DIAGNOSIS — G459 Transient cerebral ischemic attack, unspecified: Secondary | ICD-10-CM

## 2018-07-21 DIAGNOSIS — I4891 Unspecified atrial fibrillation: Secondary | ICD-10-CM | POA: Diagnosis not present

## 2018-07-21 DIAGNOSIS — I482 Chronic atrial fibrillation, unspecified: Secondary | ICD-10-CM

## 2018-07-21 LAB — POCT INR: INR: 1.5 — AB (ref 2.0–3.0)

## 2018-07-21 NOTE — Patient Instructions (Addendum)
Description   Today take 1.5 tablets, tomorrow take 1 tablet, then continue taking 1/2 tablet daily except 1 tablet on Mondays, Wednesdays, and Fridays.  Recheck INR in 2 weeks.  Call us with any new medications or concerns [phone].

## 2018-08-04 ENCOUNTER — Ambulatory Visit (INDEPENDENT_AMBULATORY_CARE_PROVIDER_SITE_OTHER): Payer: Medicare Other

## 2018-08-04 DIAGNOSIS — I482 Chronic atrial fibrillation, unspecified: Secondary | ICD-10-CM | POA: Diagnosis not present

## 2018-08-04 DIAGNOSIS — I4891 Unspecified atrial fibrillation: Secondary | ICD-10-CM

## 2018-08-04 DIAGNOSIS — G459 Transient cerebral ischemic attack, unspecified: Secondary | ICD-10-CM

## 2018-08-04 DIAGNOSIS — Z5181 Encounter for therapeutic drug level monitoring: Secondary | ICD-10-CM | POA: Diagnosis not present

## 2018-08-04 LAB — POCT INR: INR: 1.5 — AB (ref 2.0–3.0)

## 2018-08-04 NOTE — Patient Instructions (Signed)
Description   Today take 1.5 tablets, then start taking 1 tablet daily except 1/2 tablet on Sundays and Thursdays.  Recheck INR in 2 weeks.  Call us with any new medications or concerns [phone].

## 2018-08-18 ENCOUNTER — Ambulatory Visit (INDEPENDENT_AMBULATORY_CARE_PROVIDER_SITE_OTHER): Payer: Medicare Other

## 2018-08-18 DIAGNOSIS — G459 Transient cerebral ischemic attack, unspecified: Secondary | ICD-10-CM | POA: Diagnosis not present

## 2018-08-18 DIAGNOSIS — I482 Chronic atrial fibrillation, unspecified: Secondary | ICD-10-CM | POA: Diagnosis not present

## 2018-08-18 DIAGNOSIS — I4891 Unspecified atrial fibrillation: Secondary | ICD-10-CM | POA: Diagnosis not present

## 2018-08-18 DIAGNOSIS — Z5181 Encounter for therapeutic drug level monitoring: Secondary | ICD-10-CM | POA: Diagnosis not present

## 2018-08-18 LAB — POCT INR: INR: 1.8 — AB (ref 2.0–3.0)

## 2018-08-18 NOTE — Patient Instructions (Signed)
Description   Today take 1.5 tablets, then start taking 1 tablet daily except 1/2 tablet on Sundays.  Recheck INR in 2 weeks.  Call us with any new medications or concerns [phone].

## 2018-09-01 ENCOUNTER — Ambulatory Visit (INDEPENDENT_AMBULATORY_CARE_PROVIDER_SITE_OTHER): Payer: Medicare Other | Admitting: *Deleted

## 2018-09-01 DIAGNOSIS — Z5181 Encounter for therapeutic drug level monitoring: Secondary | ICD-10-CM | POA: Diagnosis not present

## 2018-09-01 DIAGNOSIS — I482 Chronic atrial fibrillation, unspecified: Secondary | ICD-10-CM | POA: Diagnosis not present

## 2018-09-01 DIAGNOSIS — G459 Transient cerebral ischemic attack, unspecified: Secondary | ICD-10-CM

## 2018-09-01 DIAGNOSIS — I4891 Unspecified atrial fibrillation: Secondary | ICD-10-CM | POA: Diagnosis not present

## 2018-09-01 LAB — POCT INR: INR: 2.1 (ref 2.0–3.0)

## 2018-09-01 NOTE — Patient Instructions (Signed)
Description   Continue taking 1 tablet daily except 1/2 tablet on Sundays.  Recheck INR in 3 weeks.  Call us with any new medications or concerns [phone].

## 2018-09-10 ENCOUNTER — Other Ambulatory Visit: Payer: Self-pay

## 2018-09-10 ENCOUNTER — Encounter (INDEPENDENT_AMBULATORY_CARE_PROVIDER_SITE_OTHER): Payer: Medicare Other | Admitting: Ophthalmology

## 2018-09-10 DIAGNOSIS — I10 Essential (primary) hypertension: Secondary | ICD-10-CM | POA: Diagnosis not present

## 2018-09-10 DIAGNOSIS — H35033 Hypertensive retinopathy, bilateral: Secondary | ICD-10-CM

## 2018-09-10 DIAGNOSIS — H353122 Nonexudative age-related macular degeneration, left eye, intermediate dry stage: Secondary | ICD-10-CM | POA: Diagnosis not present

## 2018-09-10 DIAGNOSIS — H353211 Exudative age-related macular degeneration, right eye, with active choroidal neovascularization: Secondary | ICD-10-CM

## 2018-09-10 DIAGNOSIS — H43813 Vitreous degeneration, bilateral: Secondary | ICD-10-CM

## 2018-09-19 ENCOUNTER — Telehealth: Payer: Self-pay

## 2018-09-19 NOTE — Telephone Encounter (Signed)
CALLED PT TO PRESCREEN BUT PT WANTED TO CHANGE APPT TO 10/06/18 @1145AM 

## 2018-10-02 ENCOUNTER — Telehealth: Payer: Self-pay

## 2018-10-02 NOTE — Telephone Encounter (Signed)

## 2018-10-06 ENCOUNTER — Ambulatory Visit (INDEPENDENT_AMBULATORY_CARE_PROVIDER_SITE_OTHER): Payer: Medicare Other | Admitting: *Deleted

## 2018-10-06 ENCOUNTER — Other Ambulatory Visit: Payer: Self-pay | Admitting: Cardiovascular Disease

## 2018-10-06 ENCOUNTER — Other Ambulatory Visit: Payer: Self-pay

## 2018-10-06 DIAGNOSIS — I482 Chronic atrial fibrillation, unspecified: Secondary | ICD-10-CM

## 2018-10-06 DIAGNOSIS — I4891 Unspecified atrial fibrillation: Secondary | ICD-10-CM | POA: Diagnosis not present

## 2018-10-06 DIAGNOSIS — Z5181 Encounter for therapeutic drug level monitoring: Secondary | ICD-10-CM | POA: Diagnosis not present

## 2018-10-06 DIAGNOSIS — G459 Transient cerebral ischemic attack, unspecified: Secondary | ICD-10-CM

## 2018-10-06 LAB — POCT INR: INR: 1.8 — AB (ref 2.0–3.0)

## 2018-10-06 NOTE — Patient Instructions (Addendum)
Description   Spoke with pt and instructed pt to take 1.5 tablets today then continue taking 1 tablet daily except 1/2 tablet on Sundays.  Recheck INR in 3 weeks.  Call us with any new medications or concerns [phone].

## 2018-10-24 ENCOUNTER — Telehealth: Payer: Self-pay

## 2018-10-24 NOTE — Telephone Encounter (Signed)

## 2018-10-27 ENCOUNTER — Ambulatory Visit (INDEPENDENT_AMBULATORY_CARE_PROVIDER_SITE_OTHER): Payer: Medicare Other | Admitting: *Deleted

## 2018-10-27 ENCOUNTER — Other Ambulatory Visit: Payer: Self-pay

## 2018-10-27 DIAGNOSIS — I4891 Unspecified atrial fibrillation: Secondary | ICD-10-CM

## 2018-10-27 DIAGNOSIS — G459 Transient cerebral ischemic attack, unspecified: Secondary | ICD-10-CM

## 2018-10-27 DIAGNOSIS — Z5181 Encounter for therapeutic drug level monitoring: Secondary | ICD-10-CM | POA: Diagnosis not present

## 2018-10-27 DIAGNOSIS — I482 Chronic atrial fibrillation, unspecified: Secondary | ICD-10-CM | POA: Diagnosis not present

## 2018-10-27 LAB — POCT INR: INR: 2.5 (ref 2.0–3.0)

## 2018-10-27 NOTE — Patient Instructions (Signed)
Description   Spoke with pt and instructed pt to continue taking 1 tablet daily except 1/2 tablet on Sundays.  Recheck INR in 4 weeks.  Call us with any new medications or concerns [phone].

## 2018-10-30 ENCOUNTER — Other Ambulatory Visit: Payer: Self-pay | Admitting: Cardiovascular Disease

## 2018-11-11 ENCOUNTER — Telehealth: Payer: Self-pay

## 2018-11-11 NOTE — Telephone Encounter (Signed)
Virtual Visit Pre-Appointment Phone Call  "(Name), I am calling you today to discuss your upcoming appointment. We are currently trying to limit exposure to the virus that causes COVID-19 by seeing patients at home rather than in the office."  1. "What is the BEST phone number to call the day of the visit?" - include this in appointment notes  2. "Do you have or have access to (through a family member/friend) a smartphone with video capability that we can use for your visit?" a. If yes - list this number in appt notes as "cell" (if different from BEST phone #) and list the appointment type as a VIDEO visit in appointment notes b. If no - list the appointment type as a PHONE visit in appointment notes  3. Confirm consent - "In the setting of the current Covid19 crisis, you are scheduled for a (phone or video) visit with your provider on (date) at (time).  Just as we do with many in-office visits, in order for you to participate in this visit, we must obtain consent.  If you'd like, I can send this to your mychart (if signed up) or email for you to review.  Otherwise, I can obtain your verbal consent now.  All virtual visits are billed to your insurance company just like a normal visit would be.  By agreeing to a virtual visit, we'd like you to understand that the technology does not allow for your provider to perform an examination, and thus may limit your provider's ability to fully assess your condition. If your provider identifies any concerns that need to be evaluated in person, we will make arrangements to do so.  Finally, though the technology is pretty good, we cannot assure that it will always work on either your or our end, and in the setting of a video visit, we may have to convert it to a phone-only visit.  In either situation, we cannot ensure that we have a secure connection.  Are you willing to proceed?YES  4. Advise patient to be prepared - "Two hours prior to your appointment, go  ahead and check your blood pressure, pulse, oxygen saturation, and your weight (if you have the equipment to check those) and write them all down. When your visit starts, your provider will ask you for this information. If you have an Apple Watch or Kardia device, please plan to have heart rate information ready on the day of your appointment. Please have a pen and paper handy nearby the day of the visit as well."  5. Give patient instructions for MyChart download to smartphone OR Doximity/Doxy.me as below if video visit (depending on what platform provider is using)  6. Inform patient they will receive a phone call 15 minutes prior to their appointment time (may be from unknown caller ID) so they should be prepared to answer    TELEPHONE CALL NOTE  Aaron Frye has been deemed a candidate for a follow-up tele-health visit to limit community exposure during the Covid-19 pandemic. I spoke with the patient via phone to ensure availability of phone/video source, confirm preferred email & phone number, and discuss instructions and expectations.  I reminded Aaron Frye to be prepared with any vital sign and/or heart rhythm information that could potentially be obtained via home monitoring, at the time of his visit. I reminded Aaron Frye to expect a phone call prior to his visit.  Michaelyn Barter, RN 11/11/2018 1:47 PM   IF USING  DOXIMITY or DOXY.ME - The patient will receive a link just prior to their visit by text.     FULL LENGTH CONSENT FOR TELE-HEALTH VISIT   I hereby voluntarily request, consent and authorize [facility] and its employed or contracted physicians, physician assistants, nurse practitioners or other licensed health care professionals (the Practitioner), to provide me with telemedicine health care services (the "Services") as deemed necessary by the treating Practitioner. I acknowledge and consent to receive the Services by the Practitioner via telemedicine.  I understand that the telemedicine visit will involve communicating with the Practitioner through live audiovisual communication technology and the disclosure of certain medical information by electronic transmission. I acknowledge that I have been given the opportunity to request an in-person assessment or other available alternative prior to the telemedicine visit and am voluntarily participating in the telemedicine visit.  I understand that I have the right to withhold or withdraw my consent to the use of telemedicine in the course of my care at any time, without affecting my right to future care or treatment, and that the Practitioner or I may terminate the telemedicine visit at any time. I understand that I have the right to inspect all information obtained and/or recorded in the course of the telemedicine visit and may receive copies of available information for a reasonable fee.  I understand that some of the potential risks of receiving the Services via telemedicine include:  [name] Delay or interruption in medical evaluation due to technological equipment failure or disruption; . Information transmitted may not be sufficient (e.g. poor resolution of images) to allow for appropriate medical decision making by the Practitioner; and/or  . In rare instances, security protocols could fail, causing a breach of personal health information.  Furthermore, I acknowledge that it is my responsibility to provide information about my medical history, conditions and care that is complete and accurate to the best of my ability. I acknowledge that Practitioner's advice, recommendations, and/or decision may be based on factors not within their control, such as incomplete or inaccurate data provided by me or distortions of diagnostic images or specimens that may result from electronic transmissions. I understand that the practice of medicine is not an exact science and that Practitioner makes no warranties or guarantees  regarding treatment outcomes. I acknowledge that I will receive a copy of this consent concurrently upon execution via email to the email address I last provided but may also request a printed copy by calling the office of [facility].    I understand that my insurance will be billed for this visit.   I have read or had this consent read to me. . I understand the contents of this consent, which adequately explains the benefits and risks of the Services being provided via telemedicine.  . I have been provided ample opportunity to ask questions regarding this consent and the Services and have had my questions answered to my satisfaction. . I give my informed consent for the services to be provided through the use of telemedicine in my medical care  By participating in this telemedicine visit I agree to the above.

## 2018-11-12 NOTE — Progress Notes (Signed)
Virtual Visit via Telephone Note   This visit type was conducted due to national recommendations for restrictions regarding the COVID-19 Pandemic (e.g. social distancing) in an effort to limit this patient's exposure and mitigate transmission in our community.  Due to his co-morbid illnesses, this patient is at least at moderate risk for complications without adequate follow up.  This format is felt to be most appropriate for this patient at this time.  The patient did not have access to video technology/had technical difficulties with video requiring transitioning to audio format only (telephone).  All issues noted in this document were discussed and addressed.  No physical exam could be performed with this format.  Please refer to the patient's chart for his  consent to telehealth for [facility].   Date:  11/12/2018   ID:  Aaron Frye, DOB 06/24/1925, MRN [id number]  Patient Location: Home Provider Location: Office  PCP:  Aaron Major, MD  Cardiologist:  Aaron Rouge, MD   Electrophysiologist:  None   Evaluation Performed:  Follow-Up Visit  Chief Complaint:  Afib  History of Present Illness:    Aaron Frye is a 85 y.o.  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 Beta blocker increased due to tachycardia and diuresed. ACE held for low BP. Not a surgical candidate of mitral valve due to age.   10/31/17. BNP 2481. Increase Lasix to 60 bid and Kdur to 20 bid.  Feeling better since lasix adjusted  Uses oxygen 2L 24/7.   Dyspnea only with exertion does not think he needs oxygen as sats always over 90% but at rest Compliant with meds  He is active at home mopping, vacuuming and even trying to do some chin ups   The patient does  not have symptoms concerning for COVID-19 infection (fever, chills, cough, or new shortness of breath).    Past Medical History:  Diagnosis Date  . Atrial fibrillation ([place])    Persisent   . CVA (cerebral vascular accident) ([place])   . Femur fracture, right [facility]) March 2016  . HLD (hyperlipidemia)   . HTN (hypertension)   . Mitral stenosis   . Non-ischemic cardiomyopathy ([place])    LVEF=35-40% by echo 2009  . Prostate cancer ([place])   . TIA (transient ischemic attack)    Past Surgical History:  Procedure Laterality Date  . APPENDECTOMY    . INTRAMEDULLARY (IM) NAIL INTERTROCHANTERIC Right 09/24/2014   Procedure: INTRAMEDULLARY (IM) NAIL INTERTROCHANTRIC;  Surgeon: Rod Can, MD;  Location: [place];  Service: Orthopedics;  Laterality: Right;  . TONSILLECTOMY       No outpatient medications have been marked as taking for the 11/19/18 encounter (Appointment) with Josue Hector, MD.     Allergies:   Penicillins   Social History   Tobacco Use  . Smoking status: Never Smoker  . Smokeless tobacco: Never Used  Substance Use Topics  . Alcohol use: No  . Drug use: No     Family Hx: The patient's family history includes Cancer in his brother; Heart attack in his brother; Heart failure in his mother; Hypertension in his son; Lung disease in his father. There is no history of Stroke.  ROS:   Please see the history of present illness.     All  other systems reviewed and are negative.   Prior CV studies:   The following studies were reviewed today:  Echo 07/18/17 Study Conclusions  - Left ventricle: The cavity size was normal. Wall thickness was   increased in a pattern of mild LVH. Systolic function was   vigorous. The estimated ejection fraction was in the range of 65%   to 70%. Wall motion was normal; there were no regional wall   motion abnormalities. - Aortic valve: Moderately calcified annulus. Severely thickened,   severely calcified leaflets. There was mild stenosis.  - Mitral valve: Moderately dilated annulus. Severely calcified   leaflets . The findings are consistent with severe stenosis.   There was moderate regurgitation. Valve area by pressure   half-time: 1.68 cm^2. Valve area by continuity equation (using   LVOT flow): 0.7 cm^2. - Left atrium: The atrium was severely dilated. - Right ventricle: The cavity size was mildly dilated. Systolic   function was mildly reduced. - Right atrium: The atrium was mildly to moderately dilated. - Tricuspid valve: There was moderate-severe regurgitation. - Pulmonary arteries: Systolic pressure was moderately to severely   increased. PA peak pressure: 60 mm Hg (S).   Labs/Other Tests and Data Reviewed:    EKG:   afib inferolateral T wave inversions 10/23/17  Recent Labs: 11/19/2017: BUN 25; Creatinine, Ser 1.11; NT-Pro BNP 2,887; Potassium 4.4; Sodium 144   Recent Lipid Panel Lab Results  Component Value Date/Time   CHOL 97 09/24/2014 05:07 AM   TRIG 53 09/24/2014 05:07 AM   HDL 28 (L) 09/24/2014 05:07 AM   CHOLHDL 3.5 09/24/2014 05:07 AM   LDLCALC 58 09/24/2014 05:07 AM    Wt Readings from Last 3 Encounters:  05/14/18 66.5 kg  01/31/18 65.3 kg  11/19/17 67.6 kg     Objective:    Vital Signs:  There were no vitals taken for this visit.   No exam telephone visit   ASSESSMENT & PLAN:    1. Persistent atrial fibrillation- good rate control and anticoagulation no bleeding issues no falls    2. Acute on chronic diastolic failure- better with higher dose lasix   3. Mitral valve disease -Last echo in 07/2017 showed moderate MR and severe MS. Not a surgical candidate due to age.  Mean gradient 12 mmHg with mild AS as well Continue diuretic and beta blocker to lower LA pressure And maximize diastolic filling time   [id number] Education: The signs and symptoms of COVID-19 were discussed with the patient and how to seek care for testing (follow up with PCP or arrange E-visit).  The importance of  social distancing was discussed today.  Time:   Today, I have spent 30 minutes with the patient with telehealth technology discussing the above problems.     Medication Adjustments/Labs and Tests Ordered: Current medicines are reviewed at length with the patient today.  Concerns regarding medicines are outlined above.   Tests Ordered: No orders of the defined types were placed in this encounter.   Medication Changes: No orders of the defined types were placed in this encounter.   Disposition:  Follow up in 3 months   Signed, Aaron Rouge, MD  11/12/2018 11:08 AM    [facility]

## 2018-11-13 ENCOUNTER — Other Ambulatory Visit: Payer: Self-pay | Admitting: Cardiovascular Disease

## 2018-11-19 ENCOUNTER — Telehealth (INDEPENDENT_AMBULATORY_CARE_PROVIDER_SITE_OTHER): Payer: Medicare Other | Admitting: Cardiovascular Disease

## 2018-11-19 ENCOUNTER — Encounter: Payer: Self-pay | Admitting: Cardiovascular Disease

## 2018-11-19 ENCOUNTER — Other Ambulatory Visit: Payer: Self-pay

## 2018-11-19 VITALS — BP 124/70 | HR 72 | Ht 67.0 in | Wt 142.0 lb

## 2018-11-19 DIAGNOSIS — I482 Chronic atrial fibrillation, unspecified: Secondary | ICD-10-CM

## 2018-11-19 NOTE — Patient Instructions (Signed)
Medication Instructions:   If you need a refill on your cardiac medications before your next appointment, please call your pharmacy.   Lab work:  If you have labs (blood work) drawn today and your tests are completely normal, you will receive your results only by: . MyChart Message (if you have MyChart) OR . A paper copy in the mail If you have any lab test that is abnormal or we need to change your treatment, we will call you to review the results.  Testing/Procedures: None ordered today.  Follow-Up: At [facility], you and your health needs are our priority.  As part of our continuing mission to provide you with exceptional heart care, we have created designated Provider Care Teams.  These Care Teams include your primary Cardiologist (physician) and Advanced Practice Providers (APPs -  Physician Assistants and Nurse Practitioners) who all work together to provide you with the care you need, when you need it. You will need a follow up appointment in 3  months.  You may see Peter Nishan, MD or one of the following Advanced Practice Providers on your designated Care Team:   Lori Gerhardt, NP Laura Ingold, NP . Jill McDaniel, NP    

## 2018-11-21 ENCOUNTER — Telehealth: Payer: Self-pay

## 2018-11-21 NOTE — Telephone Encounter (Signed)

## 2018-11-25 ENCOUNTER — Ambulatory Visit (INDEPENDENT_AMBULATORY_CARE_PROVIDER_SITE_OTHER): Payer: Medicare Other | Admitting: Pharmacist

## 2018-11-25 ENCOUNTER — Other Ambulatory Visit: Payer: Self-pay

## 2018-11-25 DIAGNOSIS — G459 Transient cerebral ischemic attack, unspecified: Secondary | ICD-10-CM | POA: Diagnosis not present

## 2018-11-25 DIAGNOSIS — I482 Chronic atrial fibrillation, unspecified: Secondary | ICD-10-CM | POA: Diagnosis not present

## 2018-11-25 DIAGNOSIS — Z5181 Encounter for therapeutic drug level monitoring: Secondary | ICD-10-CM | POA: Diagnosis not present

## 2018-11-25 DIAGNOSIS — I4891 Unspecified atrial fibrillation: Secondary | ICD-10-CM

## 2018-11-25 LAB — POCT INR: INR: 1.7 — AB (ref 2.0–3.0)

## 2018-12-10 ENCOUNTER — Telehealth: Payer: Self-pay

## 2018-12-10 NOTE — Telephone Encounter (Signed)

## 2018-12-16 ENCOUNTER — Ambulatory Visit (INDEPENDENT_AMBULATORY_CARE_PROVIDER_SITE_OTHER): Payer: Medicare Other | Admitting: *Deleted

## 2018-12-16 ENCOUNTER — Other Ambulatory Visit: Payer: Self-pay

## 2018-12-16 DIAGNOSIS — G459 Transient cerebral ischemic attack, unspecified: Secondary | ICD-10-CM | POA: Diagnosis not present

## 2018-12-16 DIAGNOSIS — I4891 Unspecified atrial fibrillation: Secondary | ICD-10-CM

## 2018-12-16 DIAGNOSIS — Z5181 Encounter for therapeutic drug level monitoring: Secondary | ICD-10-CM

## 2018-12-16 DIAGNOSIS — I482 Chronic atrial fibrillation, unspecified: Secondary | ICD-10-CM | POA: Diagnosis not present

## 2018-12-16 LAB — POCT INR: INR: 1.6 — AB (ref 2.0–3.0)

## 2018-12-16 NOTE — Patient Instructions (Signed)
Description   Today take 2 tablets today, then start taking 1 tablet daily.  Recheck INR in 3 weeks.  Call us with any new medications or concerns [phone].

## 2018-12-17 ENCOUNTER — Encounter (INDEPENDENT_AMBULATORY_CARE_PROVIDER_SITE_OTHER): Payer: Medicare Other | Admitting: Ophthalmology

## 2018-12-17 DIAGNOSIS — H353211 Exudative age-related macular degeneration, right eye, with active choroidal neovascularization: Secondary | ICD-10-CM

## 2018-12-17 DIAGNOSIS — H353122 Nonexudative age-related macular degeneration, left eye, intermediate dry stage: Secondary | ICD-10-CM | POA: Diagnosis not present

## 2018-12-17 DIAGNOSIS — I10 Essential (primary) hypertension: Secondary | ICD-10-CM | POA: Diagnosis not present

## 2018-12-17 DIAGNOSIS — H35033 Hypertensive retinopathy, bilateral: Secondary | ICD-10-CM

## 2018-12-17 DIAGNOSIS — H43813 Vitreous degeneration, bilateral: Secondary | ICD-10-CM

## 2018-12-19 ENCOUNTER — Other Ambulatory Visit: Payer: Self-pay | Admitting: Cardiovascular Disease

## 2018-12-30 ENCOUNTER — Telehealth: Payer: Self-pay

## 2018-12-30 NOTE — Telephone Encounter (Signed)

## 2019-01-06 ENCOUNTER — Ambulatory Visit (INDEPENDENT_AMBULATORY_CARE_PROVIDER_SITE_OTHER): Payer: Medicare Other | Admitting: *Deleted

## 2019-01-06 ENCOUNTER — Encounter (INDEPENDENT_AMBULATORY_CARE_PROVIDER_SITE_OTHER): Payer: Self-pay

## 2019-01-06 ENCOUNTER — Other Ambulatory Visit: Payer: Self-pay

## 2019-01-06 DIAGNOSIS — Z5181 Encounter for therapeutic drug level monitoring: Secondary | ICD-10-CM | POA: Diagnosis not present

## 2019-01-06 DIAGNOSIS — I482 Chronic atrial fibrillation, unspecified: Secondary | ICD-10-CM

## 2019-01-06 DIAGNOSIS — G459 Transient cerebral ischemic attack, unspecified: Secondary | ICD-10-CM

## 2019-01-06 LAB — POCT INR: INR: 2.1 (ref 2.0–3.0)

## 2019-01-06 NOTE — Patient Instructions (Signed)
Description   Continue taking 1 tablet daily.  Recheck INR in 3 weeks.  Call us with any new medications or concerns [phone].

## 2019-01-22 ENCOUNTER — Telehealth: Payer: Self-pay

## 2019-01-22 NOTE — Telephone Encounter (Signed)
Spoke with pt regarding covid-19 screening prior to appt. Pt stated he has not been in contact with anyone who may have covid-19 and has no symptoms. 

## 2019-01-27 ENCOUNTER — Ambulatory Visit (INDEPENDENT_AMBULATORY_CARE_PROVIDER_SITE_OTHER): Payer: Medicare Other | Admitting: *Deleted

## 2019-01-27 ENCOUNTER — Other Ambulatory Visit: Payer: Self-pay

## 2019-01-27 DIAGNOSIS — Z5181 Encounter for therapeutic drug level monitoring: Secondary | ICD-10-CM

## 2019-01-27 DIAGNOSIS — G459 Transient cerebral ischemic attack, unspecified: Secondary | ICD-10-CM

## 2019-01-27 DIAGNOSIS — I482 Chronic atrial fibrillation, unspecified: Secondary | ICD-10-CM | POA: Diagnosis not present

## 2019-01-27 LAB — POCT INR: INR: 2.1 (ref 2.0–3.0)

## 2019-01-27 NOTE — Patient Instructions (Signed)
Description   Continue taking 1 tablet daily.  Recheck INR in 4 weeks.  Call us with any new medications or concerns [phone].

## 2019-01-29 ENCOUNTER — Other Ambulatory Visit: Payer: Self-pay | Admitting: Cardiovascular Disease

## 2019-02-04 ENCOUNTER — Other Ambulatory Visit: Payer: Self-pay | Admitting: Cardiovascular Disease

## 2019-02-20 NOTE — Progress Notes (Signed)
Cardiology Office Note    Date:  03/04/2019   ID:  Aaron Frye, DOB 24-Aug-1924, MRN [id number]  PCP:  Nickola Major, MD  Cardiologist:  Dr. Johnsie Cancel   Chief Complaint: weight gain   History of Present Illness:   Aaron Frye is a 85 y.o. 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 Beta blocker increased due to tachycardia and diuresed. ACE held for low BP. Not a surgical candidate of mitral valve due to age.   10/31/17. BNP 2481. Increase Lasix to 60 bid and Kdur to 20 bid.  Feeling better since lasix adjusted  Uses oxygen 2L 24/7.  Dyspnea only with exertion does not think he needs oxygen as sats always over 90% but at rest Compliant with meds  He is active at home mopping, vacuuming and even trying to do some chin ups Still driving    Past Medical History:  Diagnosis Date  . Atrial fibrillation ([place])    Persisent   . CVA (cerebral vascular accident) ([place])   . Femur fracture, right [facility]) March 2016  . HLD (hyperlipidemia)   . HTN (hypertension)   . Mitral stenosis   . Non-ischemic cardiomyopathy ([place])    LVEF=35-40% by echo 2009  . Prostate cancer ([place])   . TIA (transient ischemic attack)     Past Surgical History:  Procedure Laterality Date  . APPENDECTOMY    . INTRAMEDULLARY (IM) NAIL INTERTROCHANTERIC Right 09/24/2014   Procedure: INTRAMEDULLARY (IM) NAIL INTERTROCHANTRIC;  Surgeon: Rod Can, MD;  Location: [place];  Service: Orthopedics;  Laterality: Right;  . TONSILLECTOMY      Current Medications: Prior to Admission medications   Medication Sig Start Date End Date Taking? Authorizing Provider  acetaminophen (TYLENOL) 325 MG tablet Take 325 mg by mouth 2 (two) times daily as needed (for headaches).    [provider]  albuterol (PROVENTIL HFA;VENTOLIN HFA) 108 (90 Base) MCG/ACT inhaler Inhale 2 puffs into the lungs every 6 (six) hours as needed for wheezing or shortness of breath.  07/03/17   [provider]  atorvastatin (LIPITOR) 40 MG tablet TAKE ONE TABLET BY MOUTH ONCE DAILY 01/23/17   Josue Hector, MD  furosemide (LASIX) 40 MG tablet Take 1.5 tablets (60 mg total) by mouth 2 (two) times daily. 11/18/17   Josue Hector, MD  ipratropium-albuterol (DUONEB) 0.5-2.5 (3) MG/3ML SOLN Inhale 3 mLs into the lungs every 6 (six) hours as needed (for shortness of breath or wheezing).  07/25/17   [provider]  loratadine (CLARITIN) 10 MG tablet Take 1 tablet (10 mg total) by mouth daily. 10/08/14   Angiulli, Lavon Paganini, PA-C  metoprolol succinate (TOPROL-XL) 25 MG 24 hr tablet Take 3 tablets (75 mg total) by mouth daily. 10/31/17   Josue Hector, MD  moxifloxacin (VIGAMOX) 0.5 % ophthalmic solution Place 1 drop into the right eye See admin instructions. Instill 1 drop into the right eye daily AS DIRECTED for 2 days after ocular injection every three months 10/03/17   [provider]  Multiple Vitamins-Minerals (ADULT ONE DAILY GUMMIES) CHEW Chew 1 tablet by mouth daily.    [provider]  OXYGEN Inhale 2 L into the lungs continuous.    [provider]  potassium chloride (KLOR-CON M10)  10 MEQ tablet Take 2 tablets (20 mEq total) by mouth 2 (two) times daily. 11/18/17   Josue Hector, MD  ramipril (ALTACE) 5 MG capsule Take 1 capsule (5 mg total) by mouth daily. 11/07/17   Reyne Dumas, MD  saccharomyces boulardii (FLORASTOR) 250 MG capsule Take 1 capsule (250 mg total) by mouth 2 (two) times daily. 10/08/14   Angiulli, Lavon Paganini, PA-C  warfarin (COUMADIN) 5 MG tablet TAKE AS DIRECTED BY MOUTH BY COUMADIN CLINIC 11/01/17   Josue Hector, MD    Allergies:   Penicillins   Social History   Socioeconomic History  . Marital status: Married    Spouse name: Not on  file  . Number of children: Not on file  . Years of education: Not on file  . Highest education level: Not on file  Occupational History  . Occupation: Hospital doctor  Social Needs  . Financial resource strain: Not on file  . Food insecurity    Worry: Not on file    Inability: Not on file  . Transportation needs    Medical: Not on file    Non-medical: Not on file  Tobacco Use  . Smoking status: Never Smoker  . Smokeless tobacco: Never Used  Substance and Sexual Activity  . Alcohol use: No  . Drug use: No  . Sexual activity: Not on file  Lifestyle  . Physical activity    Days per week: Not on file    Minutes per session: Not on file  . Stress: Not on file  Relationships  . Social Herbalist on phone: Not on file    Gets together: Not on file    Attends religious service: Not on file    Active member of club or organization: Not on file    Attends meetings of clubs or organizations: Not on file    Relationship status: Not on file  Other Topics Concern  . Not on file  Social History Narrative  . Not on file     Family History:  The patient's family history includes Cancer in his brother; Heart attack in his brother; Heart failure in his mother; Hypertension in his son; Lung disease in his father.   ROS:   Please see the history of present illness.    ROS All other systems reviewed and are negative.   PHYSICAL EXAM:   VS:  BP 122/60   Pulse 71   Ht 5\' 7"  (1.702 m)   Wt 140 lb 9.6 oz (63.8 kg)   SpO2 97%   BMI 22.02 kg/m   Filed Weights   03/22/19 1433  Weight: 140 lb 9.6 oz (63.8 kg)   Affect appropriate Elderly kyphotic white male  HEENT: normal Neck supple with no adenopathy JVP normal no bruits no thyromegaly Lungs clear with no wheezing and good diaphragmatic motion Heart:  S1/S2 no murmur, no rub, gallop or click PMI normal Abdomen: benighn, BS positve, no tenderness, no AAA no bruit.  No HSM or HJR Distal pulses intact with no bruits  Plus 1 brawny edema chronic  Neuro non-focal Skin warm and dry No muscular weakness    Wt Readings from Last 3 Encounters:  03-22-19 140 lb 9.6 oz (63.8 kg)  11/19/18 142 lb (64.4 kg)  05/14/18 146 lb 8 oz (66.5 kg)      Studies/Labs Reviewed:   EKG:    03-22-2019  afib rate 71 non specific ST changes   Recent Labs: No results found  for requested labs within last 8760 hours.   Lipid Panel    Component Value Date/Time   CHOL 97 09/24/2014 0507   TRIG 53 09/24/2014 0507   HDL 28 (L) 09/24/2014 0507   CHOLHDL 3.5 09/24/2014 0507   VLDL 11 09/24/2014 0507   LDLCALC 58 09/24/2014 0507    Additional studies/ records that were reviewed today include:   Echo 07/18/17: Study Conclusions - Left ventricle: The cavity size was normal. Wall thickness was increased in a pattern of mild LVH. Systolic function was vigorous. The estimated ejection fraction was in the range of 65% to 70%. Wall motion was normal; there were no regional wall motion abnormalities. - Aortic valve: Moderately calcified annulus. Severely thickened, severely calcified leaflets. There was mild stenosis. - Mitral valve: Moderately dilated annulus. Severely calcified leaflets . The findings are consistent with severe stenosis. There was moderate regurgitation. Valve area by pressure half-time: 1.68 cm^2. Valve area by continuity equation (using LVOT flow): 0.7 cm^2. - Left atrium: The atrium was severely dilated. - Right ventricle: The cavity size was mildly dilated. Systolic function was mildly reduced. - Right atrium: The atrium was mildly to moderately dilated. - Tricuspid valve: There was moderate-severe regurgitation. - Pulmonary arteries: Systolic pressure was moderately to severely increased. PA peak pressure: 60 mm Hg (S).    ASSESSMENT & PLAN:    1. Persistent atrial fibrillation- good rate control and anticoagulation no bleeding issues no falls   2. Acute on chronic  diastolic failure- better with higher dose lasix   3. Mitral valve disease -Last echo in 07/2017 showed moderate MR and severe MS. Not a surgical candidate due to age.  Mean gradient 12 mmHg with mild AS as well Continue diuretic and beta blocker to lower LA pressure And maximize diastolic filling time    Medication Adjustments/Labs and Tests Ordered: Current medicines are reviewed at length with the patient today.  Concerns regarding medicines are outlined above.  Medication changes, Labs and Tests ordered today are listed in the Patient Instructions below. Patient Instructions  Your physician recommends that you continue on your current medications as directed. Please refer to the Current Medication list given to you today.   Your physician wants you to follow-up in: [place] will receive a reminder letter in the mail two months in advance. If you don't receive a letter, please call our office to schedule the follow-up appointment.     Signed, Jenkins Rouge, MD  03/04/2019 2:49 PM    [facility] [address], [place], [place]  [address] Phone: [phone]; Fax: [phone]

## 2019-02-24 ENCOUNTER — Other Ambulatory Visit: Payer: Self-pay

## 2019-02-24 ENCOUNTER — Ambulatory Visit (INDEPENDENT_AMBULATORY_CARE_PROVIDER_SITE_OTHER): Payer: Medicare Other | Admitting: *Deleted

## 2019-02-24 DIAGNOSIS — G459 Transient cerebral ischemic attack, unspecified: Secondary | ICD-10-CM

## 2019-02-24 DIAGNOSIS — Z5181 Encounter for therapeutic drug level monitoring: Secondary | ICD-10-CM | POA: Diagnosis not present

## 2019-02-24 DIAGNOSIS — I4891 Unspecified atrial fibrillation: Secondary | ICD-10-CM | POA: Diagnosis not present

## 2019-02-24 DIAGNOSIS — I482 Chronic atrial fibrillation, unspecified: Secondary | ICD-10-CM

## 2019-02-24 LAB — POCT INR: INR: 3 (ref 2.0–3.0)

## 2019-02-24 NOTE — Patient Instructions (Signed)
Description   Continue taking 1 tablet daily.  Recheck INR in 5 weeks.  Call us with any new medications or concerns [phone].

## 2019-03-04 ENCOUNTER — Encounter: Payer: Self-pay | Admitting: Cardiovascular Disease

## 2019-03-04 ENCOUNTER — Ambulatory Visit (INDEPENDENT_AMBULATORY_CARE_PROVIDER_SITE_OTHER): Payer: Medicare Other | Admitting: Cardiovascular Disease

## 2019-03-04 ENCOUNTER — Other Ambulatory Visit: Payer: Self-pay

## 2019-03-04 VITALS — BP 122/60 | HR 71 | Ht 67.0 in | Wt 140.6 lb

## 2019-03-04 DIAGNOSIS — I482 Chronic atrial fibrillation, unspecified: Secondary | ICD-10-CM

## 2019-03-04 NOTE — Patient Instructions (Signed)
Your physician recommends that you continue on your current medications as directed. Please refer to the Current Medication list given to you today.   Your physician wants you to follow-up in:  6 MONTHS WITH DR NISHAN  You will receive a reminder letter in the mail two months in advance. If you don't receive a letter, please call our office to schedule the follow-up appointment. 

## 2019-03-30 ENCOUNTER — Emergency Department (HOSPITAL_COMMUNITY): Payer: Medicare Other

## 2019-03-30 ENCOUNTER — Encounter (HOSPITAL_COMMUNITY): Payer: Self-pay

## 2019-03-30 ENCOUNTER — Other Ambulatory Visit: Payer: Self-pay

## 2019-03-30 ENCOUNTER — Inpatient Hospital Stay (HOSPITAL_COMMUNITY)
Admission: EM | Admit: 2019-03-30 | Discharge: 2019-04-04 | DRG: 480 | Disposition: A | Discharge status: Home Health | Payer: Medicare Other | Attending: Family Medicine | Admitting: Family Medicine

## 2019-03-30 DIAGNOSIS — I10 Essential (primary) hypertension: Secondary | ICD-10-CM

## 2019-03-30 DIAGNOSIS — S72002A Fracture of unspecified part of neck of left femur, initial encounter for closed fracture: Secondary | ICD-10-CM | POA: Diagnosis present

## 2019-03-30 DIAGNOSIS — D72829 Elevated white blood cell count, unspecified: Secondary | ICD-10-CM | POA: Diagnosis not present

## 2019-03-30 DIAGNOSIS — S72142A Displaced intertrochanteric fracture of left femur, initial encounter for closed fracture: Secondary | ICD-10-CM | POA: Diagnosis present

## 2019-03-30 DIAGNOSIS — I050 Rheumatic mitral stenosis: Secondary | ICD-10-CM | POA: Diagnosis present

## 2019-03-30 DIAGNOSIS — I482 Chronic atrial fibrillation, unspecified: Secondary | ICD-10-CM | POA: Diagnosis present

## 2019-03-30 DIAGNOSIS — Y92019 Unspecified place in single-family (private) house as the place of occurrence of the external cause: Secondary | ICD-10-CM

## 2019-03-30 DIAGNOSIS — N179 Acute kidney failure, unspecified: Secondary | ICD-10-CM | POA: Diagnosis not present

## 2019-03-30 DIAGNOSIS — Z809 Family history of malignant neoplasm, unspecified: Secondary | ICD-10-CM

## 2019-03-30 DIAGNOSIS — Z8249 Family history of ischemic heart disease and other diseases of the circulatory system: Secondary | ICD-10-CM

## 2019-03-30 DIAGNOSIS — E785 Hyperlipidemia, unspecified: Secondary | ICD-10-CM | POA: Diagnosis present

## 2019-03-30 DIAGNOSIS — I272 Pulmonary hypertension, unspecified: Secondary | ICD-10-CM | POA: Diagnosis present

## 2019-03-30 DIAGNOSIS — J9611 Chronic respiratory failure with hypoxia: Secondary | ICD-10-CM | POA: Diagnosis present

## 2019-03-30 DIAGNOSIS — W1830XA Fall on same level, unspecified, initial encounter: Secondary | ICD-10-CM | POA: Diagnosis present

## 2019-03-30 DIAGNOSIS — I110 Hypertensive heart disease with heart failure: Secondary | ICD-10-CM | POA: Diagnosis present

## 2019-03-30 DIAGNOSIS — Z20828 Contact with and (suspected) exposure to other viral communicable diseases: Secondary | ICD-10-CM | POA: Diagnosis present

## 2019-03-30 DIAGNOSIS — Z880 Allergy status to penicillin: Secondary | ICD-10-CM

## 2019-03-30 DIAGNOSIS — I428 Other cardiomyopathies: Secondary | ICD-10-CM | POA: Diagnosis present

## 2019-03-30 DIAGNOSIS — I5033 Acute on chronic diastolic (congestive) heart failure: Secondary | ICD-10-CM | POA: Diagnosis present

## 2019-03-30 DIAGNOSIS — R739 Hyperglycemia, unspecified: Secondary | ICD-10-CM | POA: Diagnosis present

## 2019-03-30 DIAGNOSIS — Z8546 Personal history of malignant neoplasm of prostate: Secondary | ICD-10-CM | POA: Diagnosis not present

## 2019-03-30 DIAGNOSIS — J449 Chronic obstructive pulmonary disease, unspecified: Secondary | ICD-10-CM | POA: Diagnosis present

## 2019-03-30 DIAGNOSIS — Z7901 Long term (current) use of anticoagulants: Secondary | ICD-10-CM | POA: Diagnosis not present

## 2019-03-30 DIAGNOSIS — Z8673 Personal history of transient ischemic attack (TIA), and cerebral infarction without residual deficits: Secondary | ICD-10-CM | POA: Diagnosis not present

## 2019-03-30 DIAGNOSIS — Z419 Encounter for procedure for purposes other than remedying health state, unspecified: Secondary | ICD-10-CM

## 2019-03-30 LAB — CBC WITH DIFFERENTIAL/PLATELET
Abs Immature Granulocytes: 0.07 10*3/uL (ref 0.00–0.07)
Basophils Absolute: 0 10*3/uL (ref 0.0–0.1)
Basophils Relative: 0 %
Eosinophils Absolute: 0.1 10*3/uL (ref 0.0–0.5)
Eosinophils Relative: 1 %
HCT: 42.1 % (ref 39.0–52.0)
Hemoglobin: 13.3 g/dL (ref 13.0–17.0)
Immature Granulocytes: 1 %
Lymphocytes Relative: 7 %
Lymphs Abs: 1 10*3/uL (ref 0.7–4.0)
MCH: 27 pg (ref 26.0–34.0)
MCHC: 31.6 g/dL (ref 30.0–36.0)
MCV: 85.4 fL (ref 80.0–100.0)
Monocytes Absolute: 1 10*3/uL (ref 0.1–1.0)
Monocytes Relative: 7 %
Neutro Abs: 11.4 10*3/uL — ABNORMAL HIGH (ref 1.7–7.7)
Neutrophils Relative %: 84 %
Platelets: 211 10*3/uL (ref 150–400)
RBC: 4.93 MIL/uL (ref 4.22–5.81)
RDW: 16.9 % — ABNORMAL HIGH (ref 11.5–15.5)
WBC: 13.6 10*3/uL — ABNORMAL HIGH (ref 4.0–10.5)
nRBC: 0 % (ref 0.0–0.2)

## 2019-03-30 LAB — SARS CORONAVIRUS 2 BY RT PCR (HOSPITAL ORDER, PERFORMED IN ~~LOC~~ HOSPITAL LAB): SARS Coronavirus 2: NEGATIVE

## 2019-03-30 LAB — TYPE AND SCREEN
ABO/RH(D): O POS
Antibody Screen: NEGATIVE

## 2019-03-30 LAB — GLUCOSE, CAPILLARY
Glucose-Capillary: 137 mg/dL — ABNORMAL HIGH (ref 70–99)
Glucose-Capillary: 115 mg/dL — ABNORMAL HIGH (ref 70–99)
Glucose-Capillary: 157 mg/dL — ABNORMAL HIGH (ref 70–99)
Glucose-Capillary: 151 mg/dL — ABNORMAL HIGH (ref 70–99)

## 2019-03-30 LAB — PROTIME-INR
INR: 2.4 — ABNORMAL HIGH (ref 0.8–1.2)
INR: 2.6 — ABNORMAL HIGH (ref 0.8–1.2)
INR: 2.7 — ABNORMAL HIGH (ref 0.8–1.2)
Prothrombin Time: 26.1 seconds — ABNORMAL HIGH (ref 11.4–15.2)
Prothrombin Time: 27.3 seconds — ABNORMAL HIGH (ref 11.4–15.2)
Prothrombin Time: 28 seconds — ABNORMAL HIGH (ref 11.4–15.2)

## 2019-03-30 LAB — CBG MONITORING, ED
Glucose-Capillary: 121 mg/dL — ABNORMAL HIGH (ref 70–99)
Glucose-Capillary: 124 mg/dL — ABNORMAL HIGH (ref 70–99)

## 2019-03-30 LAB — COMPREHENSIVE METABOLIC PANEL
ALT: 30 U/L (ref 0–44)
AST: 31 U/L (ref 15–41)
Albumin: 3.7 g/dL (ref 3.5–5.0)
Alkaline Phosphatase: 123 U/L (ref 38–126)
Anion gap: 12 (ref 5–15)
BUN: 34 mg/dL — ABNORMAL HIGH (ref 8–23)
CO2: 30 mmol/L (ref 22–32)
Calcium: 9.4 mg/dL (ref 8.9–10.3)
Chloride: 98 mmol/L (ref 98–111)
Creatinine, Ser: 1 mg/dL (ref 0.61–1.24)
GFR calc Af Amer: >60 mL/min (ref >60)
GFR calc non Af Amer: >60 mL/min (ref >60)
Glucose, Bld: 170 mg/dL — ABNORMAL HIGH (ref 70–99)
Potassium: 4.3 mmol/L (ref 3.5–5.1)
Sodium: 140 mmol/L (ref 135–145)
Total Bilirubin: 1 mg/dL (ref 0.3–1.2)
Total Protein: 8.3 g/dL — ABNORMAL HIGH (ref 6.5–8.1)

## 2019-03-30 LAB — HEMOGLOBIN A1C
Hgb A1c MFr Bld: 6.6 % — ABNORMAL HIGH (ref 4.8–5.6)
Mean Plasma Glucose: 142.72 mg/dL

## 2019-03-30 LAB — SURGICAL PCR SCREEN
MRSA, PCR: NEGATIVE
Staphylococcus aureus: POSITIVE — AB

## 2019-03-30 MED ORDER — PHYTONADIONE 5 MG PO TABS
5.0000 mg | ORAL_TABLET | Freq: Once | ORAL | Status: AC
  Administered 2019-03-30: 5 mg via ORAL
  Filled 2019-03-30: qty 1

## 2019-03-30 MED ORDER — INSULIN ASPART 100 UNIT/ML ~~LOC~~ SOLN: 0.0000 [IU] | SUBCUTANEOUS | Status: DC

## 2019-03-30 MED ORDER — POLYETHYLENE GLYCOL 3350 17 G PO PACK: 17.0000 g | PACK | Freq: Every day | ORAL | Status: DC | PRN

## 2019-03-30 MED ORDER — ENSURE ENLIVE PO LIQD
237.0000 mL | Freq: Two times a day (BID) | ORAL | Status: DC
  Administered 2019-03-30 – 2019-04-03 (×4): 237 mL via ORAL

## 2019-03-30 MED ORDER — HYDROCODONE-ACETAMINOPHEN 5-325 MG PO TABS
1.0000 | ORAL_TABLET | Freq: Four times a day (QID) | ORAL | Status: DC | PRN
  Administered 2019-03-30: 08:00:00 2 via ORAL
  Administered 2019-04-01 – 2019-04-02 (×2): 1 via ORAL
  Filled 2019-03-30: qty 1
  Filled 2019-03-30: qty 2
  Filled 2019-03-30: qty 1

## 2019-03-30 MED ORDER — IPRATROPIUM-ALBUTEROL 0.5-2.5 (3) MG/3ML IN SOLN: 3.0000 mL | Freq: Four times a day (QID) | RESPIRATORY_TRACT | Status: DC | PRN

## 2019-03-30 MED ORDER — METOPROLOL SUCCINATE ER 50 MG PO TB24
75.0000 mg | ORAL_TABLET | Freq: Every day | ORAL | Status: DC
  Administered 2019-03-30 – 2019-04-04 (×4): 75 mg via ORAL
  Filled 2019-03-30 (×3): qty 1
  Filled 2019-03-30: qty 3
  Filled 2019-03-30: qty 1

## 2019-03-30 MED ORDER — INSULIN ASPART 100 UNIT/ML ~~LOC~~ SOLN
0.0000 [IU] | SUBCUTANEOUS | Status: DC
  Administered 2019-03-30 (×2): 1 [IU] via SUBCUTANEOUS
  Administered 2019-04-02: 13:00:00 3 [IU] via SUBCUTANEOUS
  Administered 2019-04-02: 2 [IU] via SUBCUTANEOUS
  Administered 2019-04-02 – 2019-04-03 (×5): 1 [IU] via SUBCUTANEOUS
  Administered 2019-04-03: 2 [IU] via SUBCUTANEOUS

## 2019-03-30 MED ORDER — FENTANYL CITRATE (PF) 100 MCG/2ML IJ SOLN
50.0000 ug | Freq: Once | INTRAMUSCULAR | Status: AC
  Administered 2019-03-30: 50 ug via INTRAVENOUS
  Filled 2019-03-30: qty 2

## 2019-03-30 MED ORDER — MORPHINE SULFATE (PF) 2 MG/ML IV SOLN
0.5000 mg | INTRAVENOUS | Status: DC | PRN
  Administered 2019-03-30: 0.5 mg via INTRAVENOUS
  Filled 2019-03-30: qty 1

## 2019-03-30 MED ORDER — ATORVASTATIN CALCIUM 40 MG PO TABS
40.0000 mg | ORAL_TABLET | Freq: Every day | ORAL | Status: DC
  Administered 2019-03-30 – 2019-04-03 (×4): 40 mg via ORAL
  Filled 2019-03-30 (×4): qty 1

## 2019-03-30 NOTE — Progress Notes (Signed)
Pt admitted for fall resulting in left hip fx. See H&P for full details. Ortho has assessed and will take for IMN tomorrow. Will be NPO pMN. Hx of a fib on coumadin. Holding for surgery/supratherapeutic INR. Repeat INR this evening. If INR < 2; can start heparin gtt. History of dHF. He looks ok, right now. Watch I&O. Bps are a little soft. Holding lasix/altace. Place hold parameters on metoprolol. Otherwise, continue as per H&P.   General: 85 y.o. male resting in bed in NAD Eyes: PERRL, normal sclera ENMT: Nares patent w/o discharge, orophaynx clear, dentition normal, ears w/o discharge/lesions/ulcers Cardiovascular: RRR, +S1, S2, no m/g/r, equal pulses throughout Respiratory: CTABL, no w/r/r, normal WOB GI: BS+, NDNT, no masses noted, no organomegaly noted MSK: chronic BLE changes, no e/c/c; TTP and limited ROM of left hip Neuro: alert to name, follows commands Psyc: Appropriate interaction and affect, calm/cooperative  .Jonnie Finner, DO

## 2019-03-30 NOTE — ED Notes (Signed)
ED TO INPATIENT HANDOFF REPORT  ED Nurse Name and Phone #: [phone]  S Name/Age/Gender Aaron Frye 85 y.o. male Room/Bed: 019C/019C  Code Status   Code Status: Prior  Home/SNF/Other Home Patient oriented to: self, place, time and situation Is this baseline? Yes   Triage Complete: Triage complete  Chief Complaint blood thinners, ?hip fx  Triage Note Per [facility], pt w/ a c/o possible left hip fracture sustained from a fall. Shortening and lateral rotation noted to hip. PMS intact. Pt denied pain or LOC. No head or back pain. Hip binding in place with a sheet. PMS intact.      Allergies Allergies  Allergen Reactions  . Penicillins Rash    Has patient had a PCN reaction causing immediate rash, facial/tongue/throat swelling, SOB or lightheadedness with hypotension: Yes Has patient had a PCN reaction causing severe rash involving mucus membranes or skin necrosis: Unk Has patient had a PCN reaction that required hospitalization: No Has patient had a PCN reaction occurring within the last 10 years: No If all of the above answers are "NO", then may proceed with Cephalosporin use.     Level of Care/Admitting Diagnosis ED Disposition    ED Disposition Condition [facility] Area: [place] [[id number]]  Level of Care: Med-Surg [16]  Covid Evaluation: Asymptomatic Screening Protocol (No Symptoms)  Diagnosis: Closed left hip fracture[id number]  Admitting Physician: Vianne Bulls [id number]  Attending Physician: Vianne Bulls [id number]  Estimated length of stay: past midnight tomorrow  Certification:: I certify this patient will need inpatient services for at least 2 midnights  PT Class (Do Not Modify): Inpatient [101]  PT Acc Code (Do Not Modify): Private [1]       B Medical/Surgery History Past Medical History:  Diagnosis Date  . Atrial fibrillation ([place])    Persisent   . CVA (cerebral vascular accident)  ([place])   . Femur fracture, right [facility]) March 2016  . HLD (hyperlipidemia)   . HTN (hypertension)   . Mitral stenosis   . Non-ischemic cardiomyopathy ([place])    LVEF=35-40% by echo 2009  . Prostate cancer ([place])   . TIA (transient ischemic attack)    Past Surgical History:  Procedure Laterality Date  . APPENDECTOMY    . INTRAMEDULLARY (IM) NAIL INTERTROCHANTERIC Right 09/24/2014   Procedure: INTRAMEDULLARY (IM) NAIL INTERTROCHANTRIC;  Surgeon: Rod Can, MD;  Location: [place];  Service: Orthopedics;  Laterality: Right;  . TONSILLECTOMY       A IV Location/Drains/Wounds Patient Lines/Drains/Airways Status   Active Line/Drains/Airways    Name:   Placement date:   Placement time:   Site:   Days:   Incision (Closed) 09/24/14 Hip Right   09/24/14    1313     1648   Incision (Closed) 09/24/14 Thigh Right   09/24/14    1402     1648   Incision (Closed) 09/24/14 Knee Right   09/24/14    1402     1648          Intake/Output Last 24 hours No intake or output data in the 24 hours ending 03/30/19 0559  Labs/Imaging Results for orders placed or performed during the hospital encounter of 03/30/19 (from the past 48 hour(s))  Type and screen [place]     Status: None   Collection Time: 03/30/19  1:47 AM  Result Value Ref Range   ABO/RH(D) O POS    Antibody Screen NEG  Sample Expiration      04/02/2019,2359 Performed at [facility], [place] [address], [place], [place] [address]   CBC WITH DIFFERENTIAL     Status: Abnormal   Collection Time: 03/30/19  2:05 AM  Result Value Ref Range   WBC 13.6 (H) 4.0 - 10.5 K/uL   RBC 4.93 4.22 - 5.81 MIL/uL   Hemoglobin 13.3 13.0 - 17.0 g/dL   HCT 42.1 39.0 - 52.0 %   MCV 85.4 80.0 - 100.0 fL   MCH 27.0 26.0 - 34.0 pg   MCHC 31.6 30.0 - 36.0 g/dL   RDW 16.9 (H) 11.5 - 15.5 %   Platelets 211 150 - 400 K/uL   nRBC 0.0 0.0 - 0.2 %   Neutrophils Relative % 84 %   Neutro Abs 11.4 (H) 1.7 - 7.7 K/uL   Lymphocytes Relative  7 %   Lymphs Abs 1.0 0.7 - 4.0 K/uL   Monocytes Relative 7 %   Monocytes Absolute 1.0 0.1 - 1.0 K/uL   Eosinophils Relative 1 %   Eosinophils Absolute 0.1 0.0 - 0.5 K/uL   Basophils Relative 0 %   Basophils Absolute 0.0 0.0 - 0.1 K/uL   Immature Granulocytes 1 %   Abs Immature Granulocytes 0.07 0.00 - 0.07 K/uL    Comment: Performed at [facility], [address], [place], [place] [address]  Protime-INR     Status: Abnormal   Collection Time: 03/30/19  2:05 AM  Result Value Ref Range   Prothrombin Time 26.1 (H) 11.4 - 15.2 seconds   INR 2.4 (H) 0.8 - 1.2    Comment: (NOTE) INR goal varies based on device and disease states. Performed at [facility], [place] [address], [place], [place] [address]   Comprehensive metabolic panel     Status: Abnormal   Collection Time: 03/30/19  2:05 AM  Result Value Ref Range   Sodium 140 135 - 145 mmol/L   Potassium 4.3 3.5 - 5.1 mmol/L   Chloride 98 98 - 111 mmol/L   CO2 30 22 - 32 mmol/L   Glucose, Bld 170 (H) 70 - 99 mg/dL   BUN 34 (H) 8 - 23 mg/dL   Creatinine, Ser 1.00 0.61 - 1.24 mg/dL   Calcium 9.4 8.9 - 10.3 mg/dL   Total Protein 8.3 (H) 6.5 - 8.1 g/dL   Albumin 3.7 3.5 - 5.0 g/dL   AST 31 15 - 41 U/L   ALT 30 0 - 44 U/L   Alkaline Phosphatase 123 38 - 126 U/L   Total Bilirubin 1.0 0.3 - 1.2 mg/dL   GFR calc non Af Amer >60 >60 mL/min   GFR calc Af Amer >60 >60 mL/min   Anion gap 12 5 - 15    Comment: Performed at [place] [address] [place], [place] [address]  SARS Coronavirus 2 [facility] order, Performed in [facility] hospital lab) Nasopharyngeal Nasopharyngeal Swab     Status: None   Collection Time: 03/30/19  2:50 AM   Specimen: Nasopharyngeal Swab  Result Value Ref Range   SARS Coronavirus 2 NEGATIVE NEGATIVE    Comment: (NOTE) If result is NEGATIVE SARS-CoV-2 target nucleic acids are NOT DETECTED. The SARS-CoV-2 RNA is generally detectable in upper and lower  respiratory specimens during the  acute phase of infection. The lowest  concentration of SARS-CoV-2 viral copies this assay can detect is 250  copies / mL. A negative result does not preclude SARS-CoV-2 infection  and should not be used as the sole basis for treatment or other  patient management decisions.  A negative result may occur with  improper specimen collection / handling, submission of specimen other  than nasopharyngeal swab, presence of viral mutation(s) within the  areas targeted by this assay, and inadequate number of viral copies  (<250 copies / mL). A negative result must be combined with clinical  observations, patient history, and epidemiological information. If result is POSITIVE SARS-CoV-2 target nucleic acids are DETECTED. The SARS-CoV-2 RNA is generally detectable in upper and lower  respiratory specimens dur ing the acute phase of infection.  Positive  results are indicative of active infection with SARS-CoV-2.  Clinical  correlation with patient history and other diagnostic information is  necessary to determine patient infection status.  Positive results do  not rule out bacterial infection or co-infection with other viruses. If result is PRESUMPTIVE POSTIVE SARS-CoV-2 nucleic acids MAY BE PRESENT.   A presumptive positive result was obtained on the submitted specimen  and confirmed on repeat testing.  While 2019 novel coronavirus  (SARS-CoV-2) nucleic acids may be present in the submitted sample  additional confirmatory testing may be necessary for epidemiological  and / or clinical management purposes  to differentiate between  SARS-CoV-2 and other Sarbecovirus currently known to infect humans.  If clinically indicated additional testing with an alternate test  methodology [phone]) is advised. The SARS-CoV-2 RNA is generally  detectable in upper and lower respiratory sp ecimens during the acute  phase of infection. The expected result is Negative. Fact Sheet for Patients:   StrictlyIdeas.no Fact Sheet for Healthcare Providers: BankingDealers.co.za This test is not yet approved or cleared by the [place] FDA and has been authorized for detection and/or diagnosis of SARS-CoV-2 by FDA under an Emergency Use Authorization (EUA).  This EUA will remain in effect (meaning this test can be used) for the duration of the COVID-19 declaration under Section 564(b)(1) of the Act, 21 U.S.C. section 360bbb-3(b)(1), unless the authorization is terminated or revoked sooner. Performed at [facility], [place] [address], [place], [place] [address]   CBG monitoring, ED     Status: Abnormal   Collection Time: 03/30/19  4:24 AM  Result Value Ref Range   Glucose-Capillary 121 (H) 70 - 99 mg/dL   Dg Chest 1 View  Result Date: 03/30/2019 CLINICAL DATA:  Hip fracture, fall EXAM: CHEST  1 VIEW COMPARISON:  Radiograph Nov 15, 2017, CT October 01, 2014 FINDINGS: There are diffuse chronic interstitial changes throughout the lungs. A bandlike area of atelectasis or scarring is present in the right lung base. More hazy opacities are present in the lower lungs with some indistinctness of the vascularity and central cuffing with fissural and septal thickening. Slight blunting of the right costophrenic sulcus suggests a small effusion. No pneumothorax. Cardiomediastinal contours are stable including a calcified tortuous aorta. Degenerative changes are present in the imaged spine and shoulders. No acute osseous or soft tissue abnormality is visualized though the bones are diffusely demineralized. IMPRESSION: 1. Findings consistent with interstitial lung disease/pulmonary edema superimposed on chronic interstitial lung disease. 2. A bandlike area of atelectasis or scarring in the right lung base. 3. Possible small right pleural effusion. 4. No acute traumatic abnormality of the chest wall. Electronically Signed   By: Lovena Le M.D.   On: 03/30/2019  02:37   Ct Hip Left Wo Contrast  Result Date: 03/30/2019 CLINICAL DATA:  Fracture of the left hip, requested by orthopedics  preoperatively EXAM: CT OF THE LEFT HIP WITHOUT CONTRAST TECHNIQUE: Multidetector CT imaging of the left hip was performed according to the standard protocol. Multiplanar CT image reconstructions were also generated. COMPARISON:  Same day radiographs FINDINGS: Bones/Joint/Cartilage Redemonstration of the comminuted, impacted intertrochanteric left femur fracture. One of the larger displaced fracture fragments includes much of the footprint of the lesser trochanter. Additional fracture fragments included portion of the insertion sites of the greater trochanter as well. The femoral head remains normally located. A moderate right hip effusion is present, likely reflecting some hemarthrosis given increased attenuation. Remaining bones of the pelvis and sacrum are intact. No diastatic widening of the symphysis pubis or visible portion of the left SI joint. Ligaments Suboptimally assessed by CT. Muscles and Tendons Mild thickening and stranding is present about the quadratus femoris and gemellus muscle bellies. Suspect mild retraction of the fractured lesser trochanter footprint by the iliopsoas insertion with mild adjacent stranding. No other tendinous discontinuity or retraction is clearly evident. Soft tissues Diffuse soft tissue swelling of the left hip. Left hip hemarthrosis, as above. Included portions of the pelvis demonstrate atherosclerotic calcification of inflow and proximal outflow vessels. Surgical clips are present in the pelvis. Scattered colonic diverticula without focal pericolonic inflammation to suggest diverticulitis. Corporal calcifications of the external genitalia are present as well. IMPRESSION: 1. Comminuted, impacted intertrochanteric left femur fracture as described above. 2. Suspect mild retraction of the fractured lesser trochanter by the iliopsoas with mild adjacent  stranding. 3. Thickening and likely intramuscular hemorrhage within the quadratus femoris and gemellus. 4. Moderate right hip effusion, likely reflecting some hemarthrosis given increased attenuation. Electronically Signed   By: Lovena Le M.D.   On: 03/30/2019 03:38   Dg Knee Complete 4 Views Left  Result Date: 03/30/2019 CLINICAL DATA:  Fall, left femur fracture EXAM: LEFT KNEE - COMPLETE 4+ VIEW COMPARISON:  None FINDINGS: The osseous structures appear diffusely demineralized which may limit detection of small or nondisplaced fractures. Included portions of the distal femur and proximal tibia and fibula are intact. There is medial compartmental narrowing with subchondral sclerotic changes. Additional milder degenerative changes are noted in the lateral femorotibial and patellofemoral compartments. Trace effusion is present with mild distal quadriceps tendon thickening. Minimal soft tissue swelling is seen anteriorly. Vascular calcium is noted posterior to the knee. IMPRESSION: 1. Diffuse bone demineralization. No definite fracture or traumatic malalignment. 2. Trace effusion and mild thickening of the distal quadriceps tendon. 3. Mild to moderate tricompartmental degenerative changes, worst in the medial compartment. Electronically Signed   By: Lovena Le M.D.   On: 03/30/2019 02:41   Dg Hip Unilat With Pelvis 2-3 Views Left  Result Date: 03/30/2019 CLINICAL DATA:  Hip fracture, fall EXAM: DG HIP (WITH OR WITHOUT PELVIS) 2-3V LEFT COMPARISON:  Radiograph 09/23/2014 FINDINGS: Bones are diffusely osteopenic which may limit detection of subtle, nondisplaced fractures. The bones of the pelvis remain congruent without diastasis or abnormal widening. The femoral heads remain normally located. There is a comminuted, impacted and slightly valgus angulated intertrochanteric left femur fracture. There is prior right intramedullary nail and transcervical fixation screw placement. Vascular calcium noted in the  medial thighs. Bowel gas pattern is unremarkable. Surgical clips are present in the low pelvis. IMPRESSION: 1. Comminuted, impacted and slightly valgus angulated intertrochanteric left femur fracture. 2. Bones are diffusely osteopenic which may limit detection of additional more subtle, nondisplaced fractures. Electronically Signed   By: Lovena Le M.D.   On: 03/30/2019 02:40    Pending Labs  Unresulted Labs (From admission, onward)    Start     Ordered   03/30/19 0500  Hemoglobin A1c  Tomorrow morning,   R    Comments: To assess prior glycemic control    03/30/19 0349   03/30/19 0500  Protime-INR  Daily,   R     03/30/19 0349          Vitals/Pain Today's Vitals   03/30/19 0144 03/30/19 0148 03/30/19 0300 03/30/19 0307  BP:   118/65 118/65  Pulse:   75 79  Resp:   (!) 21 (!) 21  Temp:    97.6 F (36.4 C)  TempSrc:    Oral  SpO2: 98%  98% 100%  PainSc:  2       Isolation Precautions No active isolations  Medications Medications  metoprolol succinate (TOPROL-XL) 24 hr tablet 75 mg (has no administration in time range)  ipratropium-albuterol (DUONEB) 0.5-2.5 (3) MG/3ML nebulizer solution 3 mL (has no administration in time range)  HYDROcodone-acetaminophen (NORCO/VICODIN) 5-325 MG per tablet 1-2 tablet (has no administration in time range)  morphine 2 MG/ML injection 0.5 mg (0.5 mg Intravenous Given 03/30/19 0422)  insulin aspart (novoLOG) injection 0-9 Units (has no administration in time range)  insulin aspart (novoLOG) injection 0-8 Units (1 Units Subcutaneous Given 03/30/19 0432)  fentaNYL (SUBLIMAZE) injection 50 mcg (50 mcg Intravenous Given 03/30/19 0255)  phytonadione (VITAMIN K) tablet 5 mg (5 mg Oral Given 03/30/19 0431)    Mobility High fall risk   Focused Assessments    R Recommendations: See Admitting Provider Note  Report given to:   Additional Notes:

## 2019-03-30 NOTE — H&P (Signed)
History and Physical    [facility] [id number] DOB: 05-09-1925 DOA: 03/30/2019  PCP: Nickola Major, MD   Patient coming from: Home   Chief Complaint: left hip pain after fall   HPI: Aaron Frye is a 85 y.o. male with medical history significant for atrial fibrillation on warfarin, history of CVA, hypertension, chronic diastolic CHF, COPD and restrictive lung disease with chronic hypoxic respiratory failure, pulmonary hypertension, and severe mitral stenosis, now presenting to the emergency department with severe left hip pain after a fall at home.  Patient had been in his usual state of health and was having an uneventful day when he went to sit in a chair, missed the chair, landed on the floor, did not hit his head or lose consciousness, but began to experience severe pain at the left hip.  EMS was called and noted a deformity.  Patient reports he been in his usual state of health leading up to this.  Reports that he remains very active at home, exercises regularly, and states that he can "easily" go up a flight of stairs without any significant dyspnea or chest pain.  Denies any recent fevers or chills, denies cough, never gets chest pain, and denies any shortness of breath.  ED Course: Upon arrival to the ED, patient is found to be afebrile, saturating well on his usual 2 L/min of supplemental oxygen, and with stable blood pressure.  EKG features rate controlled atrial fibrillation.  Chest x-ray is notable for suspected pulmonary edema superimposed on chronic interstitial lung disease and possible small right pleural effusion.  Radiographs of the left hip demonstrate comminuted, impacted, and slightly varus angulated intertrochanteric left femur fracture.  Chemistry panel is notable for an elevated BUN to creatinine ratio and glucose 170.  CBC features a leukocytosis to 13,600.  INR is therapeutic at 2.4.  Patient was given fentanyl in the ED, type and screen was performed, and  orthopedic surgery was consulted by the ED physician.  COVID-19 testing is in process.  Review of Systems:  All other systems reviewed and apart from HPI, are negative.  Past Medical History:  Diagnosis Date   Atrial fibrillation ([place])    Persisent    CVA (cerebral vascular accident) [facility])    Femur fracture, right [facility]) March 2016   HLD (hyperlipidemia)    HTN (hypertension)    Mitral stenosis    Non-ischemic cardiomyopathy ([place])    LVEF=35-40% by echo 2009   Prostate cancer ([place])    TIA (transient ischemic attack)     Past Surgical History:  Procedure Laterality Date   APPENDECTOMY     INTRAMEDULLARY (IM) NAIL INTERTROCHANTERIC Right 09/24/2014   Procedure: INTRAMEDULLARY (IM) NAIL INTERTROCHANTRIC;  Surgeon: Rod Can, MD;  Location: [facility];  Service: Orthopedics;  Laterality: Right;   TONSILLECTOMY       reports that he has never smoked. He has never used smokeless tobacco. He reports that he does not drink alcohol or use drugs.  Allergies  Allergen Reactions   Penicillins Rash    Has patient had a PCN reaction causing immediate rash, facial/tongue/throat swelling, SOB or lightheadedness with hypotension: Yes Has patient had a PCN reaction causing severe rash involving mucus membranes or skin necrosis: Unk Has patient had a PCN reaction that required hospitalization: No Has patient had a PCN reaction occurring within the last 10 years: No If all of the above answers are "NO", then may proceed with Cephalosporin use.     Family History  Problem Relation Age of Onset   Lung disease Father    Heart failure Mother    Cancer Brother        Not sure which type of cancer   Heart attack Brother    Hypertension Son    Stroke Neg Hx      Prior to Admission medications   Medication Sig Start Date End Date Taking? Authorizing Provider  acetaminophen (TYLENOL) 325 MG tablet Take 325 mg by mouth 2 (two) times daily as needed (for headaches).     [provider]  albuterol (PROVENTIL HFA;VENTOLIN HFA) 108 (90 Base) MCG/ACT inhaler Inhale 2 puffs into the lungs every 6 (six) hours as needed for wheezing or shortness of breath.  07/03/17   [provider]  atorvastatin (LIPITOR) 40 MG tablet Take 1 tablet by mouth once daily 01/29/19   Josue Hector, MD  furosemide (LASIX) 20 MG tablet Take 3 tablets (60 mg total) by mouth 2 (two) times daily. 12/23/18   Josue Hector, MD  ipratropium-albuterol (DUONEB) 0.5-2.5 (3) MG/3ML SOLN Inhale 3 mLs into the lungs every 6 (six) hours as needed (for shortness of breath or wheezing).  07/25/17   [provider]  loratadine (CLARITIN) 10 MG tablet Take 1 tablet (10 mg total) by mouth daily. 10/08/14   Angiulli, Lavon Paganini, PA-C  metoprolol succinate (TOPROL-XL) 25 MG 24 hr tablet Take 3 tablets by mouth once daily 02/05/19   Josue Hector, MD  moxifloxacin (VIGAMOX) 0.5 % ophthalmic solution Place 1 drop into the right eye See admin instructions. Instill 1 drop into the right eye daily AS DIRECTED for 2 days after ocular injection every three months 10/03/17   [provider]  Multiple Vitamins-Minerals (ADULT ONE DAILY GUMMIES) CHEW Chew 1 tablet by mouth daily.    [provider]  OXYGEN Inhale 2 L into the lungs continuous.    [provider]  potassium chloride SA (K-DUR,KLOR-CON) 20 MEQ tablet Take 2 tablets (40 mEq total) by mouth 2 (two) times daily. 06/02/18   Josue Hector, MD  ramipril (ALTACE) 5 MG capsule Take 1 capsule (5 mg total) by mouth daily. Patient not taking: Reported on 03/04/2019 11/07/17   Reyne Dumas, MD  warfarin (COUMADIN) 5 MG tablet Take 1 tablet daily or as directed by Coumadin clinic 02/05/19   Josue Hector, MD    Physical Exam: Vitals:   03/30/19 0144 03/30/19 0300 03/30/19 0307  BP:  118/65 118/65  Pulse:  75 79  Resp:  (!) 21 (!) 21  Temp:   97.6 F (36.4 C)  TempSrc:   Oral  SpO2: 98% 98% 100%    Constitutional:  NAD, calm  Eyes: PERTLA, lids and conjunctivae normal ENMT: Mucous membranes are moist. Posterior pharynx clear of any exudate or lesions.   Neck: normal, supple, no masses, no thyromegaly Respiratory:  no wheezing, no crackles. Normal respiratory effort. No accessory muscle use.  Cardiovascular: Rate 80 and irregularly irregular. No extremity edema. Abdomen: No distension, no tenderness, soft. Bowel sounds active.  Musculoskeletal: no clubbing / cyanosis. Left hip tenderness and deformity, neurovascularly intact distally.   Skin: hyperpigmentation to bilateral LEs in gaiter distribution. Warm, dry, well-perfused. Neurologic: No facial asymmetry, no dysarthria or aphasia. Gross hearing deficit. Sensation intact. Strength 5/5 in all 4 limbs.  Psychiatric: Alert and oriented x 3. Very pleasant, cooperative.    Labs on Admission: I have personally reviewed following labs and imaging studies  CBC: Recent Labs  Lab 03/30/19 0205  WBC 13.6*  NEUTROABS 11.4*  HGB 13.3  HCT 42.1  MCV 85.4  PLT 123456   Basic Metabolic Panel: Recent Labs  Lab 03/30/19 0205  NA 140  K 4.3  CL 98  CO2 30  GLUCOSE 170*  BUN 34*  CREATININE 1.00  CALCIUM 9.4   GFR: CrCl cannot be calculated (Unknown ideal weight.). Liver Function Tests: Recent Labs  Lab 03/30/19 0205  AST 31  ALT 30  ALKPHOS 123  BILITOT 1.0  PROT 8.3*  ALBUMIN 3.7   No results for input(s): LIPASE, AMYLASE in the last 168 hours. No results for input(s): AMMONIA in the last 168 hours. Coagulation Profile: Recent Labs  Lab 03/30/19 0205  INR 2.4*   Cardiac Enzymes: No results for input(s): CKTOTAL, CKMB, CKMBINDEX, TROPONINI in the last 168 hours. BNP (last 3 results) No results for input(s): PROBNP in the last 8760 hours. HbA1C: No results for input(s): HGBA1C in the last 72 hours. CBG: No results for input(s): GLUCAP in the last 168 hours. Lipid Profile: No results for input(s): CHOL, HDL, LDLCALC, TRIG, CHOLHDL,  LDLDIRECT in the last 72 hours. Thyroid Function Tests: No results for input(s): TSH, T4TOTAL, FREET4, T3FREE, THYROIDAB in the last 72 hours. Anemia Panel: No results for input(s): VITAMINB12, FOLATE, FERRITIN, TIBC, IRON, RETICCTPCT in the last 72 hours. Urine analysis:    Component Value Date/Time   COLORURINE YELLOW 10/21/2017 0954   APPEARANCEUR TURBID (A) 10/21/2017 0954   LABSPEC 1.012 10/21/2017 0954   PHURINE 6.0 10/21/2017 0954   GLUCOSEU NEGATIVE 10/21/2017 0954   HGBUR MODERATE (A) 10/21/2017 0954   BILIRUBINUR NEGATIVE 10/21/2017 0954   KETONESUR NEGATIVE 10/21/2017 0954   PROTEINUR 30 (A) 10/21/2017 0954   UROBILINOGEN 0.2 10/02/2014 0301   NITRITE NEGATIVE 10/21/2017 0954   LEUKOCYTESUR LARGE (A) 10/21/2017 0954   Sepsis Labs: @LABRCNTIP (procalcitonin:4,lacticidven:4) )No results found for this or any previous visit (from the past 240 hour(s)).   Radiological Exams on Admission: Dg Chest 1 View  Result Date: 03/30/2019 CLINICAL DATA:  Hip fracture, fall EXAM: CHEST  1 VIEW COMPARISON:  Radiograph Nov 15, 2017, CT October 01, 2014 FINDINGS: There are diffuse chronic interstitial changes throughout the lungs. A bandlike area of atelectasis or scarring is present in the right lung base. More hazy opacities are present in the lower lungs with some indistinctness of the vascularity and central cuffing with fissural and septal thickening. Slight blunting of the right costophrenic sulcus suggests a small effusion. No pneumothorax. Cardiomediastinal contours are stable including a calcified tortuous aorta. Degenerative changes are present in the imaged spine and shoulders. No acute osseous or soft tissue abnormality is visualized though the bones are diffusely demineralized. IMPRESSION: 1. Findings consistent with interstitial lung disease/pulmonary edema superimposed on chronic interstitial lung disease. 2. A bandlike area of atelectasis or scarring in the right lung base. 3.  Possible small right pleural effusion. 4. No acute traumatic abnormality of the chest wall. Electronically Signed   By: Lovena Le M.D.   On: 03/30/2019 02:37   Ct Hip Left Wo Contrast  Result Date: 03/30/2019 CLINICAL DATA:  Fracture of the left hip, requested by orthopedics preoperatively EXAM: CT OF THE LEFT HIP WITHOUT CONTRAST TECHNIQUE: Multidetector CT imaging of the left hip was performed according to the standard protocol. Multiplanar CT image reconstructions were also generated. COMPARISON:  Same day radiographs FINDINGS: Bones/Joint/Cartilage Redemonstration of the comminuted, impacted intertrochanteric left femur fracture. One of the larger displaced fracture fragments includes much  of the footprint of the lesser trochanter. Additional fracture fragments included portion of the insertion sites of the greater trochanter as well. The femoral head remains normally located. A moderate right hip effusion is present, likely reflecting some hemarthrosis given increased attenuation. Remaining bones of the pelvis and sacrum are intact. No diastatic widening of the symphysis pubis or visible portion of the left SI joint. Ligaments Suboptimally assessed by CT. Muscles and Tendons Mild thickening and stranding is present about the quadratus femoris and gemellus muscle bellies. Suspect mild retraction of the fractured lesser trochanter footprint by the iliopsoas insertion with mild adjacent stranding. No other tendinous discontinuity or retraction is clearly evident. Soft tissues Diffuse soft tissue swelling of the left hip. Left hip hemarthrosis, as above. Included portions of the pelvis demonstrate atherosclerotic calcification of inflow and proximal outflow vessels. Surgical clips are present in the pelvis. Scattered colonic diverticula without focal pericolonic inflammation to suggest diverticulitis. Corporal calcifications of the external genitalia are present as well. IMPRESSION: 1. Comminuted, impacted  intertrochanteric left femur fracture as described above. 2. Suspect mild retraction of the fractured lesser trochanter by the iliopsoas with mild adjacent stranding. 3. Thickening and likely intramuscular hemorrhage within the quadratus femoris and gemellus. 4. Moderate right hip effusion, likely reflecting some hemarthrosis given increased attenuation. Electronically Signed   By: Lovena Le M.D.   On: 03/30/2019 03:38   Dg Knee Complete 4 Views Left  Result Date: 03/30/2019 CLINICAL DATA:  Fall, left femur fracture EXAM: LEFT KNEE - COMPLETE 4+ VIEW COMPARISON:  None FINDINGS: The osseous structures appear diffusely demineralized which may limit detection of small or nondisplaced fractures. Included portions of the distal femur and proximal tibia and fibula are intact. There is medial compartmental narrowing with subchondral sclerotic changes. Additional milder degenerative changes are noted in the lateral femorotibial and patellofemoral compartments. Trace effusion is present with mild distal quadriceps tendon thickening. Minimal soft tissue swelling is seen anteriorly. Vascular calcium is noted posterior to the knee. IMPRESSION: 1. Diffuse bone demineralization. No definite fracture or traumatic malalignment. 2. Trace effusion and mild thickening of the distal quadriceps tendon. 3. Mild to moderate tricompartmental degenerative changes, worst in the medial compartment. Electronically Signed   By: Lovena Le M.D.   On: 03/30/2019 02:41   Dg Hip Unilat With Pelvis 2-3 Views Left  Result Date: 03/30/2019 CLINICAL DATA:  Hip fracture, fall EXAM: DG HIP (WITH OR WITHOUT PELVIS) 2-3V LEFT COMPARISON:  Radiograph 09/23/2014 FINDINGS: Bones are diffusely osteopenic which may limit detection of subtle, nondisplaced fractures. The bones of the pelvis remain congruent without diastasis or abnormal widening. The femoral heads remain normally located. There is a comminuted, impacted and slightly valgus angulated  intertrochanteric left femur fracture. There is prior right intramedullary nail and transcervical fixation screw placement. Vascular calcium noted in the medial thighs. Bowel gas pattern is unremarkable. Surgical clips are present in the low pelvis. IMPRESSION: 1. Comminuted, impacted and slightly valgus angulated intertrochanteric left femur fracture. 2. Bones are diffusely osteopenic which may limit detection of additional more subtle, nondisplaced fractures. Electronically Signed   By: Lovena Le M.D.   On: 03/30/2019 02:40    EKG: Independently reviewed. Atrial fibrillation, rate 81, QTc 480 ms.   Assessment/Plan   1. Left hip fracture  - Presents with left hip pain and deformity after a fall while trying to sit back into a chair  - Intertrochanteric fracture noted on radiographs  - Based on the available data, Aaron Frye presents an estimated  1% risk of perioperative MI or cardiac arrest  - Hold warfarin, give 5 mg PO vit K now, repeat INR in am, start IV heparin when <2, if more prompt reversal needed, can repeat vit K and give FFP shortly prior to procedure  - Continue pain-control, CMS checks, NPO, hold ACE inhibitor, continue beta-blocker, follow fluid-status and diurese as needed    2. Atrial fibrillation  - In rate-controlled a fib on admission  - CHADS-VASc at least 31 (age x2, CVA x2, CHF, HTN)  - Hold warfarin, give 5 mg oral vit K, repeat INR in am, start IV heparin when <2, continue metoprolol    3. COPD; chronic hypoxic respiratory failure  - Patient denies any recent cough, SOB, or wheezing  - He also has restrictive lung disease  - Continue supplemental O2, as-needed Duonebs    4. Chronic diastolic CHF  - Appears compensated  - EF normal on echo from January 2019  - SLIV, hold Lasix initially while NPO, continue beta-blocker, hold ACE for surgery, follow daily wt and I/O's    5. Hypertension  - BP at goal, continue metoprolol, hold ACE for surgery   6. History of  CVA  - He has a fib with high stroke risk and he should be on IV heparin when INR <2  - Continue statin    7. Hyperglycemia  - A1c was 6.2% in April 2019  - Monitor CBG's and use a sensitive scale SSI with Novolog if needed   8. Mitral valve disease  - Echo from January 2019 with moderate regurgitation and severe stenosis  - Not a surgical candidate per cardiology notes, but managed medically with beta-blocker and diuretic to lower LA pressures  - Continue beta-blocker, SLIV and hold Lasix initially while NPO, follow daily wt and I/O's, diurese as needed     PPE: Mask, face shield  DVT prophylaxis: warfarin pta  Code Status: Full  Family Communication: Discussed with patient  Consults called: Orthopedic surgery  Admission status: Inpatient     Vianne Bulls, MD [facility] Pager [phone]  If 7PM-7AM, please contact night-coverage [email or web address] Password [facility] ([facility])  03/30/2019, 3:51 AM

## 2019-03-30 NOTE — H&P (View-Only) (Signed)
Reason for Consult:Left hip fx Referring Physician: T Chidiebere Roa is an 85 y.o. male.  HPI: Aaron Frye was coming back from the kitchen at home. He went to sit down in his chair and missed. He sat down hard on the floor and had immediate left hip pain and could not get up. He was brought to the ED where x-rays showed a left intertroch fx and orthopedic surgery was consulted. He c/o localized pain to the hip.  Past Medical History:  Diagnosis Date  . Atrial fibrillation ([place])    Persisent   . CVA (cerebral vascular accident) ([place])   . Femur fracture, right [facility]) March 2016  . HLD (hyperlipidemia)   . HTN (hypertension)   . Mitral stenosis   . Non-ischemic cardiomyopathy ([place])    LVEF=35-40% by echo 2009  . Prostate cancer ([place])   . TIA (transient ischemic attack)     Past Surgical History:  Procedure Laterality Date  . APPENDECTOMY    . INTRAMEDULLARY (IM) NAIL INTERTROCHANTERIC Right 09/24/2014   Procedure: INTRAMEDULLARY (IM) NAIL INTERTROCHANTRIC;  Surgeon: Rod Can, MD;  Location: [facility];  Service: Orthopedics;  Laterality: Right;  . TONSILLECTOMY      Family History  Problem Relation Age of Onset  . Lung disease Father   . Heart failure Mother   . Cancer Brother        Not sure which type of cancer  . Heart attack Brother   . Hypertension Son   . Stroke Neg Hx     Social History:  reports that he has never smoked. He has never used smokeless tobacco. He reports that he does not drink alcohol or use drugs.  Allergies:  Allergies  Allergen Reactions  . Penicillins Rash    Has patient had a PCN reaction causing immediate rash, facial/tongue/throat swelling, SOB or lightheadedness with hypotension: Yes Has patient had a PCN reaction causing severe rash involving mucus membranes or skin necrosis: Unk Has patient had a PCN reaction that required hospitalization: No Has patient had a PCN reaction occurring within the last 10 years: No If all of the above  answers are "NO", then may proceed with Cephalosporin use.     Medications: I have reviewed the patient's current medications.  Results for orders placed or performed during the hospital encounter of 03/30/19 (from the past 48 hour(s))  Type and screen [place]     Status: None   Collection Time: 03/30/19  1:47 AM  Result Value Ref Range   ABO/RH(D) O POS    Antibody Screen NEG    Sample Expiration      04/02/2019,2359 Performed at [facility], [place] [address] [place], [place] [address]   CBC WITH DIFFERENTIAL     Status: Abnormal   Collection Time: 03/30/19  2:05 AM  Result Value Ref Range   WBC 13.6 (H) 4.0 - 10.5 K/uL   RBC 4.93 4.22 - 5.81 MIL/uL   Hemoglobin 13.3 13.0 - 17.0 g/dL   HCT 42.1 39.0 - 52.0 %   MCV 85.4 80.0 - 100.0 fL   MCH 27.0 26.0 - 34.0 pg   MCHC 31.6 30.0 - 36.0 g/dL   RDW 16.9 (H) 11.5 - 15.5 %   Platelets 211 150 - 400 K/uL   nRBC 0.0 0.0 - 0.2 %   Neutrophils Relative % 84 %   Neutro Abs 11.4 (H) 1.7 - 7.7 K/uL   Lymphocytes Relative 7 %   Lymphs  Abs 1.0 0.7 - 4.0 K/uL   Monocytes Relative 7 %   Monocytes Absolute 1.0 0.1 - 1.0 K/uL   Eosinophils Relative 1 %   Eosinophils Absolute 0.1 0.0 - 0.5 K/uL   Basophils Relative 0 %   Basophils Absolute 0.0 0.0 - 0.1 K/uL   Immature Granulocytes 1 %   Abs Immature Granulocytes 0.07 0.00 - 0.07 K/uL    Comment: Performed at [facility], [place] [address] [place], [place] [address]  Protime-INR     Status: Abnormal   Collection Time: 03/30/19  2:05 AM  Result Value Ref Range   Prothrombin Time 26.1 (H) 11.4 - 15.2 seconds   INR 2.4 (H) 0.8 - 1.2    Comment: (NOTE) INR goal varies based on device and disease states. Performed at [facility], [place] [address] [place], [place] [address]   Comprehensive metabolic panel     Status: Abnormal   Collection Time: 03/30/19  2:05 AM  Result Value Ref Range   Sodium 140 135 - 145 mmol/L   Potassium 4.3 3.5 - 5.1 mmol/L    Chloride 98 98 - 111 mmol/L   CO2 30 22 - 32 mmol/L   Glucose, Bld 170 (H) 70 - 99 mg/dL   BUN 34 (H) 8 - 23 mg/dL   Creatinine, Ser 1.00 0.61 - 1.24 mg/dL   Calcium 9.4 8.9 - 10.3 mg/dL   Total Protein 8.3 (H) 6.5 - 8.1 g/dL   Albumin 3.7 3.5 - 5.0 g/dL   AST 31 15 - 41 U/L   ALT 30 0 - 44 U/L   Alkaline Phosphatase 123 38 - 126 U/L   Total Bilirubin 1.0 0.3 - 1.2 mg/dL   GFR calc non Af Amer >60 >60 mL/min   GFR calc Af Amer >60 >60 mL/min   Anion gap 12 5 - 15    Comment: Performed at [place] [facility] [address], [place], [place] [address]  SARS Coronavirus 2 [facility] order, Performed in [facility] hospital lab) Nasopharyngeal Nasopharyngeal Swab     Status: None   Collection Time: 03/30/19  2:50 AM   Specimen: Nasopharyngeal Swab  Result Value Ref Range   SARS Coronavirus 2 NEGATIVE NEGATIVE    Comment: (NOTE) If result is NEGATIVE SARS-CoV-2 target nucleic acids are NOT DETECTED. The SARS-CoV-2 RNA is generally detectable in upper and lower  respiratory specimens during the acute phase of infection. The lowest  concentration of SARS-CoV-2 viral copies this assay can detect is 250  copies / mL. A negative result does not preclude SARS-CoV-2 infection  and should not be used as the sole basis for treatment or other  patient management decisions.  A negative result may occur with  improper specimen collection / handling, submission of specimen other  than nasopharyngeal swab, presence of viral mutation(s) within the  areas targeted by this assay, and inadequate number of viral copies  (<250 copies / mL). A negative result must be combined with clinical  observations, patient history, and epidemiological information. If result is POSITIVE SARS-CoV-2 target nucleic acids are DETECTED. The SARS-CoV-2 RNA is generally detectable in upper and lower  respiratory specimens dur ing the acute phase of infection.  Positive  results are indicative of active infection with  SARS-CoV-2.  Clinical  correlation with patient history and other diagnostic information is  necessary to determine patient infection status.  Positive results do  not rule out bacterial infection or co-infection with other viruses. If  result is PRESUMPTIVE POSTIVE SARS-CoV-2 nucleic acids MAY BE PRESENT.   A presumptive positive result was obtained on the submitted specimen  and confirmed on repeat testing.  While 2019 novel coronavirus  (SARS-CoV-2) nucleic acids may be present in the submitted sample  additional confirmatory testing may be necessary for epidemiological  and / or clinical management purposes  to differentiate between  SARS-CoV-2 and other Sarbecovirus currently known to infect humans.  If clinically indicated additional testing with an alternate test  methodology [phone]) is advised. The SARS-CoV-2 RNA is generally  detectable in upper and lower respiratory sp ecimens during the acute  phase of infection. The expected result is Negative. Fact Sheet for Patients:  StrictlyIdeas.no Fact Sheet for Healthcare Providers: BankingDealers.co.za This test is not yet approved or cleared by the [place] FDA and has been authorized for detection and/or diagnosis of SARS-CoV-2 by FDA under an Emergency Use Authorization (EUA).  This EUA will remain in effect (meaning this test can be used) for the duration of the COVID-19 declaration under Section 564(b)(1) of the Act, 21 U.S.C. section 360bbb-3(b)(1), unless the authorization is terminated or revoked sooner. Performed at [facility], [place] [address] [place], [place] [address]   CBG monitoring, ED     Status: Abnormal   Collection Time: 03/30/19  4:24 AM  Result Value Ref Range   Glucose-Capillary 121 (H) 70 - 99 mg/dL  Hemoglobin A1c     Status: Abnormal   Collection Time: 03/30/19  5:41 AM  Result Value Ref Range   Hgb A1c MFr Bld 6.6 (H) 4.8 - 5.6 %    Comment:  (NOTE) Pre diabetes:          5.7%-6.4% Diabetes:              >6.4% Glycemic control for   <7.0% adults with diabetes    Mean Plasma Glucose 142.72 mg/dL    Comment: Performed at [place] [address], [place], [place] [address]  Protime-INR     Status: Abnormal   Collection Time: 03/30/19  5:41 AM  Result Value Ref Range   Prothrombin Time 27.3 (H) 11.4 - 15.2 seconds   INR 2.6 (H) 0.8 - 1.2    Comment: (NOTE) INR goal varies based on device and disease states. Performed at [facility], [place] [address], [place], [place] [address]   CBG monitoring, ED     Status: Abnormal   Collection Time: 03/30/19  7:20 AM  Result Value Ref Range   Glucose-Capillary 124 (H) 70 - 99 mg/dL   Comment 1 Notify RN    Comment 2 Document in Chart   Glucose, capillary     Status: Abnormal   Collection Time: 03/30/19  8:32 AM  Result Value Ref Range   Glucose-Capillary 137 (H) 70 - 99 mg/dL    Dg Chest 1 View  Result Date: 03/30/2019 CLINICAL DATA:  Hip fracture, fall EXAM: CHEST  1 VIEW COMPARISON:  Radiograph Nov 15, 2017, CT October 01, 2014 FINDINGS: There are diffuse chronic interstitial changes throughout the lungs. A bandlike area of atelectasis or scarring is present in the right lung base. More hazy opacities are present in the lower lungs with some indistinctness of the vascularity and central cuffing with fissural and septal thickening. Slight blunting of the right costophrenic sulcus suggests a small effusion. No pneumothorax. Cardiomediastinal contours are stable including a calcified tortuous aorta. Degenerative changes are present in the imaged spine and shoulders. No  acute osseous or soft tissue abnormality is visualized though the bones are diffusely demineralized. IMPRESSION: 1. Findings consistent with interstitial lung disease/pulmonary edema superimposed on chronic interstitial lung disease. 2. A bandlike area of atelectasis or scarring in the right lung base. 3.  Possible small right pleural effusion. 4. No acute traumatic abnormality of the chest wall. Electronically Signed   By: Lovena Le M.D.   On: 03/30/2019 02:37   Ct Hip Left Wo Contrast  Result Date: 03/30/2019 CLINICAL DATA:  Fracture of the left hip, requested by orthopedics preoperatively EXAM: CT OF THE LEFT HIP WITHOUT CONTRAST TECHNIQUE: Multidetector CT imaging of the left hip was performed according to the standard protocol. Multiplanar CT image reconstructions were also generated. COMPARISON:  Same day radiographs FINDINGS: Bones/Joint/Cartilage Redemonstration of the comminuted, impacted intertrochanteric left femur fracture. One of the larger displaced fracture fragments includes much of the footprint of the lesser trochanter. Additional fracture fragments included portion of the insertion sites of the greater trochanter as well. The femoral head remains normally located. A moderate right hip effusion is present, likely reflecting some hemarthrosis given increased attenuation. Remaining bones of the pelvis and sacrum are intact. No diastatic widening of the symphysis pubis or visible portion of the left SI joint. Ligaments Suboptimally assessed by CT. Muscles and Tendons Mild thickening and stranding is present about the quadratus femoris and gemellus muscle bellies. Suspect mild retraction of the fractured lesser trochanter footprint by the iliopsoas insertion with mild adjacent stranding. No other tendinous discontinuity or retraction is clearly evident. Soft tissues Diffuse soft tissue swelling of the left hip. Left hip hemarthrosis, as above. Included portions of the pelvis demonstrate atherosclerotic calcification of inflow and proximal outflow vessels. Surgical clips are present in the pelvis. Scattered colonic diverticula without focal pericolonic inflammation to suggest diverticulitis. Corporal calcifications of the external genitalia are present as well. IMPRESSION: 1. Comminuted, impacted  intertrochanteric left femur fracture as described above. 2. Suspect mild retraction of the fractured lesser trochanter by the iliopsoas with mild adjacent stranding. 3. Thickening and likely intramuscular hemorrhage within the quadratus femoris and gemellus. 4. Moderate right hip effusion, likely reflecting some hemarthrosis given increased attenuation. Electronically Signed   By: Lovena Le M.D.   On: 03/30/2019 03:38   Dg Knee Complete 4 Views Left  Result Date: 03/30/2019 CLINICAL DATA:  Fall, left femur fracture EXAM: LEFT KNEE - COMPLETE 4+ VIEW COMPARISON:  None FINDINGS: The osseous structures appear diffusely demineralized which may limit detection of small or nondisplaced fractures. Included portions of the distal femur and proximal tibia and fibula are intact. There is medial compartmental narrowing with subchondral sclerotic changes. Additional milder degenerative changes are noted in the lateral femorotibial and patellofemoral compartments. Trace effusion is present with mild distal quadriceps tendon thickening. Minimal soft tissue swelling is seen anteriorly. Vascular calcium is noted posterior to the knee. IMPRESSION: 1. Diffuse bone demineralization. No definite fracture or traumatic malalignment. 2. Trace effusion and mild thickening of the distal quadriceps tendon. 3. Mild to moderate tricompartmental degenerative changes, worst in the medial compartment. Electronically Signed   By: Lovena Le M.D.   On: 03/30/2019 02:41   Dg Hip Unilat With Pelvis 2-3 Views Left  Result Date: 03/30/2019 CLINICAL DATA:  Hip fracture, fall EXAM: DG HIP (WITH OR WITHOUT PELVIS) 2-3V LEFT COMPARISON:  Radiograph 09/23/2014 FINDINGS: Bones are diffusely osteopenic which may limit detection of subtle, nondisplaced fractures. The bones of the pelvis remain congruent without diastasis or abnormal widening. The femoral heads  remain normally located. There is a comminuted, impacted and slightly valgus angulated  intertrochanteric left femur fracture. There is prior right intramedullary nail and transcervical fixation screw placement. Vascular calcium noted in the medial thighs. Bowel gas pattern is unremarkable. Surgical clips are present in the low pelvis. IMPRESSION: 1. Comminuted, impacted and slightly valgus angulated intertrochanteric left femur fracture. 2. Bones are diffusely osteopenic which may limit detection of additional more subtle, nondisplaced fractures. Electronically Signed   By: Lovena Le M.D.   On: 03/30/2019 02:40    Review of Systems  Constitutional: Negative for weight loss.  HENT: Negative for ear discharge, ear pain, hearing loss and tinnitus.   Eyes: Negative for blurred vision, double vision, photophobia and pain.  Respiratory: Negative for cough, sputum production and shortness of breath.   Cardiovascular: Negative for chest pain.  Gastrointestinal: Negative for abdominal pain, nausea and vomiting.  Genitourinary: Negative for dysuria, flank pain, frequency and urgency.  Musculoskeletal: Positive for joint pain (Left hip). Negative for back pain, falls, myalgias and neck pain.  Neurological: Negative for dizziness, tingling, sensory change, focal weakness, loss of consciousness and headaches.  Endo/Heme/Allergies: Does not bruise/bleed easily.  Psychiatric/Behavioral: Negative for depression, memory loss and substance abuse. The patient is not nervous/anxious.    Blood pressure (!) 92/54, pulse 74, temperature 98.5 F (36.9 C), temperature source Oral, resp. rate 16, SpO2 (!) 87 %. Physical Exam  Constitutional: He appears well-developed and well-nourished. No distress.  HENT:  Head: Normocephalic and atraumatic.  Eyes: Conjunctivae are normal. Right eye exhibits no discharge. Left eye exhibits no discharge. No scleral icterus.  Neck: Normal range of motion.  Cardiovascular: Normal rate and regular rhythm.  Respiratory: Effort normal. No respiratory distress.   Musculoskeletal:     Comments: LLE No traumatic wounds, ecchymosis, or rash  Mod TTP hip  No knee or ankle effusion  Knee stable to varus/ valgus and anterior/posterior stress  Sens DPN, SPN, TN intact  Motor EHL, ext, flex, evers 5/5  DP 1+, PT 0, No significant edema  Neurological: He is alert.  Skin: Skin is warm and dry. He is not diaphoretic.  Psychiatric: He has a normal mood and affect. His behavior is normal.    Assessment/Plan: Left hip fx -- Plan IMN tomorrow by Dr. Lyla Glassing. NPO after MN. Multiple medical problems including atrial fibrillation on warfarin, history of CVA, hypertension, chronic diastolic CHF, COPD and restrictive lung disease with chronic hypoxic respiratory failure, pulmonary hypertension, and severe mitral stenosis -- per primary service. Please hold coumadin until after surgery.    Lisette Abu, PA-C Orthopedic Surgery [phone] 03/30/2019, 11:23 AM

## 2019-03-30 NOTE — Care Management (Signed)
CM consult acknowledged. Awaiting PT/OT eval for DCP recommendations and will continue to follow.  Midge Minium RN, BSN, NCM-BC, ACM-RN [phone]

## 2019-03-30 NOTE — Consult Note (Signed)
Reason for Consult:Left hip fx Referring Physician: [name] is an 85 y.o. male.  HPI: Aaron Frye was coming back from the kitchen at home. He went to sit down in his chair and missed. He sat down hard on the floor and had immediate left hip pain and could not get up. He was brought to the ED where x-rays showed a left intertroch fx and orthopedic surgery was consulted. He c/o localized pain to the hip.  Past Medical History:  Diagnosis Date  . Atrial fibrillation ([place])    Persisent   . CVA (cerebral vascular accident) ([place])   . Femur fracture, right [facility]) March 2016  . HLD (hyperlipidemia)   . HTN (hypertension)   . Mitral stenosis   . Non-ischemic cardiomyopathy ([place])    LVEF=35-40% by echo 2009  . Prostate cancer ([place])   . TIA (transient ischemic attack)     Past Surgical History:  Procedure Laterality Date  . APPENDECTOMY    . INTRAMEDULLARY (IM) NAIL INTERTROCHANTERIC Right 09/24/2014   Procedure: INTRAMEDULLARY (IM) NAIL INTERTROCHANTRIC;  Surgeon: Rod Can, MD;  Location: [facility];  Service: Orthopedics;  Laterality: Right;  . TONSILLECTOMY      Family History  Problem Relation Age of Onset  . Lung disease Father   . Heart failure Mother   . Cancer Brother        Not sure which type of cancer  . Heart attack Brother   . Hypertension Son   . Stroke Neg Hx     Social History:  reports that he has never smoked. He has never used smokeless tobacco. He reports that he does not drink alcohol or use drugs.  Allergies:  Allergies  Allergen Reactions  . Penicillins Rash    Has patient had a PCN reaction causing immediate rash, facial/tongue/throat swelling, SOB or lightheadedness with hypotension: Yes Has patient had a PCN reaction causing severe rash involving mucus membranes or skin necrosis: Unk Has patient had a PCN reaction that required hospitalization: No Has patient had a PCN reaction occurring within the last 10 years: No If all of the above  answers are "NO", then may proceed with Cephalosporin use.     Medications: I have reviewed the patient's current medications.  Results for orders placed or performed during the hospital encounter of 03/30/19 (from the past 48 hour(s))  Type and screen [place]     Status: None   Collection Time: 03/30/19  1:47 AM  Result Value Ref Range   ABO/RH(D) O POS    Antibody Screen NEG    Sample Expiration      04/02/2019,2359 Performed at [facility], [place] [address] [place], [place] [address]   CBC WITH DIFFERENTIAL     Status: Abnormal   Collection Time: 03/30/19  2:05 AM  Result Value Ref Range   WBC 13.6 (H) 4.0 - 10.5 K/uL   RBC 4.93 4.22 - 5.81 MIL/uL   Hemoglobin 13.3 13.0 - 17.0 g/dL   HCT 42.1 39.0 - 52.0 %   MCV 85.4 80.0 - 100.0 fL   MCH 27.0 26.0 - 34.0 pg   MCHC 31.6 30.0 - 36.0 g/dL   RDW 16.9 (H) 11.5 - 15.5 %   Platelets 211 150 - 400 K/uL   nRBC 0.0 0.0 - 0.2 %   Neutrophils Relative % 84 %   Neutro Abs 11.4 (H) 1.7 - 7.7 K/uL   Lymphocytes Relative 7 %   Lymphs  Abs 1.0 0.7 - 4.0 K/uL   Monocytes Relative 7 %   Monocytes Absolute 1.0 0.1 - 1.0 K/uL   Eosinophils Relative 1 %   Eosinophils Absolute 0.1 0.0 - 0.5 K/uL   Basophils Relative 0 %   Basophils Absolute 0.0 0.0 - 0.1 K/uL   Immature Granulocytes 1 %   Abs Immature Granulocytes 0.07 0.00 - 0.07 K/uL    Comment: Performed at [facility], [place] [address], [place], [place] [address]  Protime-INR     Status: Abnormal   Collection Time: 03/30/19  2:05 AM  Result Value Ref Range   Prothrombin Time 26.1 (H) 11.4 - 15.2 seconds   INR 2.4 (H) 0.8 - 1.2    Comment: (NOTE) INR goal varies based on device and disease states. Performed at [facility], [place] [address], [place], [place] [address]   Comprehensive metabolic panel     Status: Abnormal   Collection Time: 03/30/19  2:05 AM  Result Value Ref Range   Sodium 140 135 - 145 mmol/L   Potassium 4.3 3.5 - 5.1 mmol/L    Chloride 98 98 - 111 mmol/L   CO2 30 22 - 32 mmol/L   Glucose, Bld 170 (H) 70 - 99 mg/dL   BUN 34 (H) 8 - 23 mg/dL   Creatinine, Ser 1.00 0.61 - 1.24 mg/dL   Calcium 9.4 8.9 - 10.3 mg/dL   Total Protein 8.3 (H) 6.5 - 8.1 g/dL   Albumin 3.7 3.5 - 5.0 g/dL   AST 31 15 - 41 U/L   ALT 30 0 - 44 U/L   Alkaline Phosphatase 123 38 - 126 U/L   Total Bilirubin 1.0 0.3 - 1.2 mg/dL   GFR calc non Af Amer >60 >60 mL/min   GFR calc Af Amer >60 >60 mL/min   Anion gap 12 5 - 15    Comment: Performed at [facility] [address], [place], [place] [address]  SARS Coronavirus 2 [facility] order, Performed in [facility] hospital lab) Nasopharyngeal Nasopharyngeal Swab     Status: None   Collection Time: 03/30/19  2:50 AM   Specimen: Nasopharyngeal Swab  Result Value Ref Range   SARS Coronavirus 2 NEGATIVE NEGATIVE    Comment: (NOTE) If result is NEGATIVE SARS-CoV-2 target nucleic acids are NOT DETECTED. The SARS-CoV-2 RNA is generally detectable in upper and lower  respiratory specimens during the acute phase of infection. The lowest  concentration of SARS-CoV-2 viral copies this assay can detect is 250  copies / mL. A negative result does not preclude SARS-CoV-2 infection  and should not be used as the sole basis for treatment or other  patient management decisions.  A negative result may occur with  improper specimen collection / handling, submission of specimen other  than nasopharyngeal swab, presence of viral mutation(s) within the  areas targeted by this assay, and inadequate number of viral copies  (<250 copies / mL). A negative result must be combined with clinical  observations, patient history, and epidemiological information. If result is POSITIVE SARS-CoV-2 target nucleic acids are DETECTED. The SARS-CoV-2 RNA is generally detectable in upper and lower  respiratory specimens dur ing the acute phase of infection.  Positive  results are indicative of active infection with  SARS-CoV-2.  Clinical  correlation with patient history and other diagnostic information is  necessary to determine patient infection status.  Positive results do  not rule out bacterial infection or co-infection with other viruses. If  result is PRESUMPTIVE POSTIVE SARS-CoV-2 nucleic acids MAY BE PRESENT.   A presumptive positive result was obtained on the submitted specimen  and confirmed on repeat testing.  While 2019 novel coronavirus  (SARS-CoV-2) nucleic acids may be present in the submitted sample  additional confirmatory testing may be necessary for epidemiological  and / or clinical management purposes  to differentiate between  SARS-CoV-2 and other Sarbecovirus currently known to infect humans.  If clinically indicated additional testing with an alternate test  methodology [phone]) is advised. The SARS-CoV-2 RNA is generally  detectable in upper and lower respiratory sp ecimens during the acute  phase of infection. The expected result is Negative. Fact Sheet for Patients:  StrictlyIdeas.no Fact Sheet for Healthcare Providers: BankingDealers.co.za This test is not yet approved or cleared by the [place] FDA and has been authorized for detection and/or diagnosis of SARS-CoV-2 by FDA under an Emergency Use Authorization (EUA).  This EUA will remain in effect (meaning this test can be used) for the duration of the COVID-19 declaration under Section 564(b)(1) of the Act, 21 U.S.C. section 360bbb-3(b)(1), unless the authorization is terminated or revoked sooner. Performed at [facility], [place] [address], [place], [place] [address]   CBG monitoring, ED     Status: Abnormal   Collection Time: 03/30/19  4:24 AM  Result Value Ref Range   Glucose-Capillary 121 (H) 70 - 99 mg/dL  Hemoglobin A1c     Status: Abnormal   Collection Time: 03/30/19  5:41 AM  Result Value Ref Range   Hgb A1c MFr Bld 6.6 (H) 4.8 - 5.6 %    Comment:  (NOTE) Pre diabetes:          5.7%-6.4% Diabetes:              >6.4% Glycemic control for   <7.0% adults with diabetes    Mean Plasma Glucose 142.72 mg/dL    Comment: Performed at [facility] [address], [place], [place] [address]  Protime-INR     Status: Abnormal   Collection Time: 03/30/19  5:41 AM  Result Value Ref Range   Prothrombin Time 27.3 (H) 11.4 - 15.2 seconds   INR 2.6 (H) 0.8 - 1.2    Comment: (NOTE) INR goal varies based on device and disease states. Performed at [facility], [place] [address] [place],  [address]   CBG monitoring, ED     Status: Abnormal   Collection Time: 03/30/19  7:20 AM  Result Value Ref Range   Glucose-Capillary 124 (H) 70 - 99 mg/dL   Comment 1 Notify RN    Comment 2 Document in Chart   Glucose, capillary     Status: Abnormal   Collection Time: 03/30/19  8:32 AM  Result Value Ref Range   Glucose-Capillary 137 (H) 70 - 99 mg/dL    Dg Chest 1 View  Result Date: 03/30/2019 CLINICAL DATA:  Hip fracture, fall EXAM: CHEST  1 VIEW COMPARISON:  Radiograph Nov 15, 2017, CT October 01, 2014 FINDINGS: There are diffuse chronic interstitial changes throughout the lungs. A bandlike area of atelectasis or scarring is present in the right lung base. More hazy opacities are present in the lower lungs with some indistinctness of the vascularity and central cuffing with fissural and septal thickening. Slight blunting of the right costophrenic sulcus suggests a small effusion. No pneumothorax. Cardiomediastinal contours are stable including a calcified tortuous aorta. Degenerative changes are present in the imaged spine and shoulders. No  acute osseous or soft tissue abnormality is visualized though the bones are diffusely demineralized. IMPRESSION: 1. Findings consistent with interstitial lung disease/pulmonary edema superimposed on chronic interstitial lung disease. 2. A bandlike area of atelectasis or scarring in the right lung base. 3.  Possible small right pleural effusion. 4. No acute traumatic abnormality of the chest wall. Electronically Signed   By: Lovena Le M.D.   On: 03/30/2019 02:37   Ct Hip Left Wo Contrast  Result Date: 03/30/2019 CLINICAL DATA:  Fracture of the left hip, requested by orthopedics preoperatively EXAM: CT OF THE LEFT HIP WITHOUT CONTRAST TECHNIQUE: Multidetector CT imaging of the left hip was performed according to the standard protocol. Multiplanar CT image reconstructions were also generated. COMPARISON:  Same day radiographs FINDINGS: Bones/Joint/Cartilage Redemonstration of the comminuted, impacted intertrochanteric left femur fracture. One of the larger displaced fracture fragments includes much of the footprint of the lesser trochanter. Additional fracture fragments included portion of the insertion sites of the greater trochanter as well. The femoral head remains normally located. A moderate right hip effusion is present, likely reflecting some hemarthrosis given increased attenuation. Remaining bones of the pelvis and sacrum are intact. No diastatic widening of the symphysis pubis or visible portion of the left SI joint. Ligaments Suboptimally assessed by CT. Muscles and Tendons Mild thickening and stranding is present about the quadratus femoris and gemellus muscle bellies. Suspect mild retraction of the fractured lesser trochanter footprint by the iliopsoas insertion with mild adjacent stranding. No other tendinous discontinuity or retraction is clearly evident. Soft tissues Diffuse soft tissue swelling of the left hip. Left hip hemarthrosis, as above. Included portions of the pelvis demonstrate atherosclerotic calcification of inflow and proximal outflow vessels. Surgical clips are present in the pelvis. Scattered colonic diverticula without focal pericolonic inflammation to suggest diverticulitis. Corporal calcifications of the external genitalia are present as well. IMPRESSION: 1. Comminuted, impacted  intertrochanteric left femur fracture as described above. 2. Suspect mild retraction of the fractured lesser trochanter by the iliopsoas with mild adjacent stranding. 3. Thickening and likely intramuscular hemorrhage within the quadratus femoris and gemellus. 4. Moderate right hip effusion, likely reflecting some hemarthrosis given increased attenuation. Electronically Signed   By: Lovena Le M.D.   On: 03/30/2019 03:38   Dg Knee Complete 4 Views Left  Result Date: 03/30/2019 CLINICAL DATA:  Fall, left femur fracture EXAM: LEFT KNEE - COMPLETE 4+ VIEW COMPARISON:  None FINDINGS: The osseous structures appear diffusely demineralized which may limit detection of small or nondisplaced fractures. Included portions of the distal femur and proximal tibia and fibula are intact. There is medial compartmental narrowing with subchondral sclerotic changes. Additional milder degenerative changes are noted in the lateral femorotibial and patellofemoral compartments. Trace effusion is present with mild distal quadriceps tendon thickening. Minimal soft tissue swelling is seen anteriorly. Vascular calcium is noted posterior to the knee. IMPRESSION: 1. Diffuse bone demineralization. No definite fracture or traumatic malalignment. 2. Trace effusion and mild thickening of the distal quadriceps tendon. 3. Mild to moderate tricompartmental degenerative changes, worst in the medial compartment. Electronically Signed   By: Lovena Le M.D.   On: 03/30/2019 02:41   Dg Hip Unilat With Pelvis 2-3 Views Left  Result Date: 03/30/2019 CLINICAL DATA:  Hip fracture, fall EXAM: DG HIP (WITH OR WITHOUT PELVIS) 2-3V LEFT COMPARISON:  Radiograph 09/23/2014 FINDINGS: Bones are diffusely osteopenic which may limit detection of subtle, nondisplaced fractures. The bones of the pelvis remain congruent without diastasis or abnormal widening. The femoral heads  remain normally located. There is a comminuted, impacted and slightly valgus angulated  intertrochanteric left femur fracture. There is prior right intramedullary nail and transcervical fixation screw placement. Vascular calcium noted in the medial thighs. Bowel gas pattern is unremarkable. Surgical clips are present in the low pelvis. IMPRESSION: 1. Comminuted, impacted and slightly valgus angulated intertrochanteric left femur fracture. 2. Bones are diffusely osteopenic which may limit detection of additional more subtle, nondisplaced fractures. Electronically Signed   By: Lovena Le M.D.   On: 03/30/2019 02:40    Review of Systems  Constitutional: Negative for weight loss.  HENT: Negative for ear discharge, ear pain, hearing loss and tinnitus.   Eyes: Negative for blurred vision, double vision, photophobia and pain.  Respiratory: Negative for cough, sputum production and shortness of breath.   Cardiovascular: Negative for chest pain.  Gastrointestinal: Negative for abdominal pain, nausea and vomiting.  Genitourinary: Negative for dysuria, flank pain, frequency and urgency.  Musculoskeletal: Positive for joint pain (Left hip). Negative for back pain, falls, myalgias and neck pain.  Neurological: Negative for dizziness, tingling, sensory change, focal weakness, loss of consciousness and headaches.  Endo/Heme/Allergies: Does not bruise/bleed easily.  Psychiatric/Behavioral: Negative for depression, memory loss and substance abuse. The patient is not nervous/anxious.    Blood pressure (!) 92/54, pulse 74, temperature 98.5 F (36.9 C), temperature source Oral, resp. rate 16, SpO2 (!) 87 %. Physical Exam  Constitutional: He appears well-developed and well-nourished. No distress.  HENT:  Head: Normocephalic and atraumatic.  Eyes: Conjunctivae are normal. Right eye exhibits no discharge. Left eye exhibits no discharge. No scleral icterus.  Neck: Normal range of motion.  Cardiovascular: Normal rate and regular rhythm.  Respiratory: Effort normal. No respiratory distress.   Musculoskeletal:     Comments: LLE No traumatic wounds, ecchymosis, or rash  Mod TTP hip  No knee or ankle effusion  Knee stable to varus/ valgus and anterior/posterior stress  Sens DPN, SPN, TN intact  Motor EHL, ext, flex, evers 5/5  DP 1+, PT 0, No significant edema  Neurological: He is alert.  Skin: Skin is warm and dry. He is not diaphoretic.  Psychiatric: He has a normal mood and affect. His behavior is normal.    Assessment/Plan: Left hip fx -- Plan IMN tomorrow by Dr. Lyla Glassing. NPO after MN. Multiple medical problems including atrial fibrillation on warfarin, history of CVA, hypertension, chronic diastolic CHF, COPD and restrictive lung disease with chronic hypoxic respiratory failure, pulmonary hypertension, and severe mitral stenosis -- per primary service. Please hold coumadin until after surgery.    Lisette Abu, PA-C Orthopedic Surgery [phone] 03/30/2019, 11:23 AM

## 2019-03-30 NOTE — ED Provider Notes (Signed)
[facility] EMERGENCY DEPARTMENT Provider Note   CSN: [id number] Arrival date & time: 03/30/19  0141     History   Chief Complaint Chief Complaint  Patient presents with   Fall    HPI Aaron Frye is a 85 y.o. male.     The history is provided by the patient and medical records.  Fall     85 y.o. M with hx of AFIB on coumadin, prior stroke, HLD, HTN, MS, h xof TIA, NICM, presenting to the ED following a fall that occurred at home.  States he was trying to sit back onto a chair, missed the chair and fell into the floor.  No head injury or LOC.  States he was unable to get out of the floor for approx 30 mins.  Wife called EMS for lift assist but then started to have some left hip pain and trouble moving the leg.  He does have left leg shortening.  Also reports some left knee pain.  Hx of right hip fracture in 2016, repaired by Dr. Lyla Glassing.  States generally he walks unassisted but has recently started using a walker to try and help strengthen his legs.  Past Medical History:  Diagnosis Date   Atrial fibrillation ([place])    Persisent    CVA (cerebral vascular accident) [facility])    Femur fracture, right [facility]) March 2016   HLD (hyperlipidemia)    HTN (hypertension)    Mitral stenosis    Non-ischemic cardiomyopathy ([place])    LVEF=35-40% by echo 2009   Prostate cancer ([place])    TIA (transient ischemic attack)     Patient Active Problem List   Diagnosis Date Noted   Acute lower UTI 10/22/2017   Acute respiratory failure with hypoxia (HCC)    Elevated troponin 10/20/2017   Acute on chronic respiratory failure with hypoxia (HCC) 10/19/2017   SOB (shortness of breath)    Hyperglycemia    COPD with hypoxia (HCC)    Diastolic CHF, acute on chronic (HCC)    Mitral stenosis    Leukocytosis 09/24/2014   Intertrochanteric fracture of right femur ([place]) 09/24/2014   Hip fracture ([place]) 09/23/2014   History of stroke 09/23/2014   HLD  (hyperlipidemia) 09/23/2014   Fall    Encounter for therapeutic drug monitoring 09/01/2013   Long term current use of anticoagulant therapy 11/14/2010   Congestive heart failure ([place]) 04/18/2010   EDEMA 10/12/2008   Elevated lipids 10/08/2008   Essential hypertension 10/08/2008   Atrial fibrillation with RVR ([place]) 10/08/2008   CVA 10/08/2008   Transient cerebral ischemia 10/08/2008    Past Surgical History:  Procedure Laterality Date   APPENDECTOMY     INTRAMEDULLARY (IM) NAIL INTERTROCHANTERIC Right 09/24/2014   Procedure: INTRAMEDULLARY (IM) NAIL INTERTROCHANTRIC;  Surgeon: Rod Can, MD;  Location: [place];  Service: Orthopedics;  Laterality: Right;   TONSILLECTOMY          Home Medications    Prior to Admission medications   Medication Sig Start Date End Date Taking? Authorizing Provider  acetaminophen (TYLENOL) 325 MG tablet Take 325 mg by mouth 2 (two) times daily as needed (for headaches).    [provider]  albuterol (PROVENTIL HFA;VENTOLIN HFA) 108 (90 Base) MCG/ACT inhaler Inhale 2 puffs into the lungs every 6 (six) hours as needed for wheezing or shortness of breath.  07/03/17   [provider]  atorvastatin (LIPITOR) 40 MG tablet Take 1 tablet by mouth once daily 01/29/19   Jenkins Rouge  C, MD  furosemide (LASIX) 20 MG tablet Take 3 tablets (60 mg total) by mouth 2 (two) times daily. 12/23/18   Josue Hector, MD  ipratropium-albuterol (DUONEB) 0.5-2.5 (3) MG/3ML SOLN Inhale 3 mLs into the lungs every 6 (six) hours as needed (for shortness of breath or wheezing).  07/25/17   [provider]  loratadine (CLARITIN) 10 MG tablet Take 1 tablet (10 mg total) by mouth daily. 10/08/14   Angiulli, Lavon Paganini, PA-C  metoprolol succinate (TOPROL-XL) 25 MG 24 hr tablet Take 3 tablets by mouth once daily 02/05/19   Josue Hector, MD  moxifloxacin (VIGAMOX) 0.5 % ophthalmic solution Place 1 drop into the right eye See admin instructions. Instill 1  drop into the right eye daily AS DIRECTED for 2 days after ocular injection every three months 10/03/17   [provider]  Multiple Vitamins-Minerals (ADULT ONE DAILY GUMMIES) CHEW Chew 1 tablet by mouth daily.    [provider]  OXYGEN Inhale 2 L into the lungs continuous.    [provider]  potassium chloride SA (K-DUR,KLOR-CON) 20 MEQ tablet Take 2 tablets (40 mEq total) by mouth 2 (two) times daily. 06/02/18   Josue Hector, MD  ramipril (ALTACE) 5 MG capsule Take 1 capsule (5 mg total) by mouth daily. Patient not taking: Reported on 03/04/2019 11/07/17   Reyne Dumas, MD  warfarin (COUMADIN) 5 MG tablet Take 1 tablet daily or as directed by Coumadin clinic 02/05/19   Josue Hector, MD    Family History Family History  Problem Relation Age of Onset   Lung disease Father    Heart failure Mother    Cancer Brother        Not sure which type of cancer   Heart attack Brother    Hypertension Son    Stroke Neg Hx     Social History Social History   Tobacco Use   Smoking status: Never Smoker   Smokeless tobacco: Never Used  Substance Use Topics   Alcohol use: No   Drug use: No     Allergies   Penicillins   Review of Systems Review of Systems  Musculoskeletal: Positive for arthralgias.  All other systems reviewed and are negative.    Physical Exam Updated Vital Signs BP 118/65 (BP Location: Right Arm)    Pulse 79    Temp 97.6 F (36.4 C) (Oral)    Resp (!) 21    SpO2 100%   Physical Exam Vitals signs and nursing note reviewed.  Constitutional:      Appearance: He is well-developed.  HENT:     Head: Normocephalic and atraumatic.  Eyes:     Conjunctiva/sclera: Conjunctivae normal.     Pupils: Pupils are equal, round, and reactive to light.  Neck:     Musculoskeletal: Normal range of motion.  Cardiovascular:     Rate and Rhythm: Normal rate and regular rhythm.     Heart sounds: Normal heart sounds.  Pulmonary:     Effort:  Pulmonary effort is normal. No respiratory distress.     Breath sounds: Normal breath sounds. No stridor.  Abdominal:     General: Bowel sounds are normal.     Palpations: Abdomen is soft.  Musculoskeletal: Normal range of motion.     Comments: Left hip is TTP along lateral aspect, there is a deformity noted, leg is shortened and externally rotated, DP pulse intact, foot is warm and well perfused, moving extremities well  Skin:  General: Skin is warm and dry.  Neurological:     Mental Status: He is alert and oriented to person, place, and time.      ED Treatments / Results  Labs (all labs ordered are listed, but only abnormal results are displayed) Labs Reviewed  CBC WITH DIFFERENTIAL/PLATELET - Abnormal; Notable for the following components:      Result Value   WBC 13.6 (*)    RDW 16.9 (*)    Neutro Abs 11.4 (*)    All other components within normal limits  PROTIME-INR - Abnormal; Notable for the following components:   Prothrombin Time 26.1 (*)    INR 2.4 (*)    All other components within normal limits  COMPREHENSIVE METABOLIC PANEL - Abnormal; Notable for the following components:   Glucose, Bld 170 (*)    BUN 34 (*)    Total Protein 8.3 (*)    All other components within normal limits  SARS CORONAVIRUS 2 (HOSPITAL ORDER, [facility])  TYPE AND SCREEN    EKG None  Radiology Dg Chest 1 View  Result Date: 03/30/2019 CLINICAL DATA:  Hip fracture, fall EXAM: CHEST  1 VIEW COMPARISON:  Radiograph Nov 15, 2017, CT October 01, 2014 FINDINGS: There are diffuse chronic interstitial changes throughout the lungs. A bandlike area of atelectasis or scarring is present in the right lung base. More hazy opacities are present in the lower lungs with some indistinctness of the vascularity and central cuffing with fissural and septal thickening. Slight blunting of the right costophrenic sulcus suggests a small effusion. No pneumothorax. Cardiomediastinal  contours are stable including a calcified tortuous aorta. Degenerative changes are present in the imaged spine and shoulders. No acute osseous or soft tissue abnormality is visualized though the bones are diffusely demineralized. IMPRESSION: 1. Findings consistent with interstitial lung disease/pulmonary edema superimposed on chronic interstitial lung disease. 2. A bandlike area of atelectasis or scarring in the right lung base. 3. Possible small right pleural effusion. 4. No acute traumatic abnormality of the chest wall. Electronically Signed   By: Lovena Le M.D.   On: 03/30/2019 02:37   Ct Hip Left Wo Contrast  Result Date: 03/30/2019 CLINICAL DATA:  Fracture of the left hip, requested by orthopedics preoperatively EXAM: CT OF THE LEFT HIP WITHOUT CONTRAST TECHNIQUE: Multidetector CT imaging of the left hip was performed according to the standard protocol. Multiplanar CT image reconstructions were also generated. COMPARISON:  Same day radiographs FINDINGS: Bones/Joint/Cartilage Redemonstration of the comminuted, impacted intertrochanteric left femur fracture. One of the larger displaced fracture fragments includes much of the footprint of the lesser trochanter. Additional fracture fragments included portion of the insertion sites of the greater trochanter as well. The femoral head remains normally located. A moderate right hip effusion is present, likely reflecting some hemarthrosis given increased attenuation. Remaining bones of the pelvis and sacrum are intact. No diastatic widening of the symphysis pubis or visible portion of the left SI joint. Ligaments Suboptimally assessed by CT. Muscles and Tendons Mild thickening and stranding is present about the quadratus femoris and gemellus muscle bellies. Suspect mild retraction of the fractured lesser trochanter footprint by the iliopsoas insertion with mild adjacent stranding. No other tendinous discontinuity or retraction is clearly evident. Soft tissues  Diffuse soft tissue swelling of the left hip. Left hip hemarthrosis, as above. Included portions of the pelvis demonstrate atherosclerotic calcification of inflow and proximal outflow vessels. Surgical clips are present in the pelvis. Scattered colonic diverticula without focal  pericolonic inflammation to suggest diverticulitis. Corporal calcifications of the external genitalia are present as well. IMPRESSION: 1. Comminuted, impacted intertrochanteric left femur fracture as described above. 2. Suspect mild retraction of the fractured lesser trochanter by the iliopsoas with mild adjacent stranding. 3. Thickening and likely intramuscular hemorrhage within the quadratus femoris and gemellus. 4. Moderate right hip effusion, likely reflecting some hemarthrosis given increased attenuation. Electronically Signed   By: Lovena Le M.D.   On: 03/30/2019 03:38   Dg Knee Complete 4 Views Left  Result Date: 03/30/2019 CLINICAL DATA:  Fall, left femur fracture EXAM: LEFT KNEE - COMPLETE 4+ VIEW COMPARISON:  None FINDINGS: The osseous structures appear diffusely demineralized which may limit detection of small or nondisplaced fractures. Included portions of the distal femur and proximal tibia and fibula are intact. There is medial compartmental narrowing with subchondral sclerotic changes. Additional milder degenerative changes are noted in the lateral femorotibial and patellofemoral compartments. Trace effusion is present with mild distal quadriceps tendon thickening. Minimal soft tissue swelling is seen anteriorly. Vascular calcium is noted posterior to the knee. IMPRESSION: 1. Diffuse bone demineralization. No definite fracture or traumatic malalignment. 2. Trace effusion and mild thickening of the distal quadriceps tendon. 3. Mild to moderate tricompartmental degenerative changes, worst in the medial compartment. Electronically Signed   By: Lovena Le M.D.   On: 03/30/2019 02:41   Dg Hip Unilat With Pelvis 2-3 Views  Left  Result Date: 03/30/2019 CLINICAL DATA:  Hip fracture, fall EXAM: DG HIP (WITH OR WITHOUT PELVIS) 2-3V LEFT COMPARISON:  Radiograph 09/23/2014 FINDINGS: Bones are diffusely osteopenic which may limit detection of subtle, nondisplaced fractures. The bones of the pelvis remain congruent without diastasis or abnormal widening. The femoral heads remain normally located. There is a comminuted, impacted and slightly valgus angulated intertrochanteric left femur fracture. There is prior right intramedullary nail and transcervical fixation screw placement. Vascular calcium noted in the medial thighs. Bowel gas pattern is unremarkable. Surgical clips are present in the low pelvis. IMPRESSION: 1. Comminuted, impacted and slightly valgus angulated intertrochanteric left femur fracture. 2. Bones are diffusely osteopenic which may limit detection of additional more subtle, nondisplaced fractures. Electronically Signed   By: Lovena Le M.D.   On: 03/30/2019 02:40    Procedures Procedures (including critical care time)  Medications Ordered in ED Medications - No data to display   Initial Impression / Assessment and Plan / ED Course  I have reviewed the triage vital signs and the nursing notes.  Pertinent labs & imaging results that were available during my care of the patient were reviewed by me and considered in my medical decision making (see chart for details).  85 year old male here after a mechanical fall at home.  He was trying to sit back into a chair, missed the chair and fell onto the floor.  There was no head injury or loss of consciousness.  He had trouble getting up for approximately 30 minutes so wife called for lift assistance.  He began having some left hip pain and EMS noted his leg was shortened and externally rotated.  Here he is awake, alert, appropriately oriented.  He is hard of hearing with this is baseline.  His left leg is shortened and rotated, deformity noted of the left hip.  His  leg remains neurovascularly intact.  X-rays confirm comminuted, impacted, and angulated intertrochanteric fracture.  Films are osteopenic, possible undetected additional fractures.  Chest films with chronic changes.  The films are negative.  Will discuss with orthopedics.  3:03 AM Discussed with orthopedics, Dr. Arville Go see in the morning and plan for repair.  Keep n.p.o. for now.  Given osteopenia on x-ray and possible undetected fractures, will send for CT of the hip.  CT with comminuted fracture again noted but also appears to have IM hemorrhage within quadratus femoris.    Discussed with Dr. Myna Hidalgo-- he will admit for ongoing care.  Final Clinical Impressions(s) / ED Diagnoses   Final diagnoses:  Closed left hip fracture, initial encounter [facility])    ED Discharge Orders    None       Larene Pickett, PA-C 03/30/19 0348    Merrily Pew, MD 03/30/19 [phone]

## 2019-03-30 NOTE — Evaluation (Signed)
Clinical/Bedside Swallow Evaluation Patient Details  Name: Aaron Frye MRN: [id number] Date of Birth: 06-18-1925  Today's Date: 03/30/2019 Time: SLP Start Time (ACUTE ONLY): 1411 SLP Stop Time (ACUTE ONLY): 1438 SLP Time Calculation (min) (ACUTE ONLY): 27 min  Past Medical History:  Past Medical History:  Diagnosis Date  . Atrial fibrillation ([place])    Persisent   . CVA (cerebral vascular accident) ([place])   . Femur fracture, right [facility]) March 2016  . HLD (hyperlipidemia)   . HTN (hypertension)   . Mitral stenosis   . Non-ischemic cardiomyopathy ([place])    LVEF=35-40% by echo 2009  . Prostate cancer ([place])   . TIA (transient ischemic attack)    Past Surgical History:  Past Surgical History:  Procedure Laterality Date  . APPENDECTOMY    . INTRAMEDULLARY (IM) NAIL INTERTROCHANTERIC Right 09/24/2014   Procedure: INTRAMEDULLARY (IM) NAIL INTERTROCHANTRIC;  Surgeon: Rod Can, MD;  Location: [place];  Service: Orthopedics;  Laterality: Right;  . TONSILLECTOMY     HPI:  85 y.o. male with medical history significant for atrial fibrillation on warfarin, history of CVA, hypertension, chronic diastolic CHF, COPD and restrictive lung disease with chronic hypoxic respiratory failure, pulmonary hypertension, and severe mitral stenosis, now presenting to the emergency department with severe left hip pain after a fall at home.  Patient had been in his usual state of health and was having an uneventful day when he went to sit in a chair, missed the chair, landed on the floor, did not hit his head or lose consciousness, but began to experience severe pain at the left hip. x-rays showed a left intertroch fx and orthopedic surgery was consulted.   Assessment / Plan / Recommendation Clinical Impression  Concern for pharyngeal dysphagia exists as pt with frequent isolated coughing this date following thin liquids by cup and straw. RN also reports difficulty observed with thin liquids. Nectar thick  liquids, puree, and regular POs were without overt s/sx of aspiration. Given clinical difficulty with thins, recommend diet modification to nectar thick liquids and regular POs with medicines in puree. Recommend follow up MBSS to objectively assess aspiration risk. SLP to follow up for MBSS as able, note upcoming surgical hip repair. Pt okay for regular water in between meals following oral care  SLP Visit Diagnosis: Dysphagia, unspecified (R13.10)    Aspiration Risk  Mild aspiration risk;Moderate aspiration risk    Diet Recommendation   Nectar thick liquids, regular consistencies; regular water in between meals following oral care  Medication Administration: Whole meds with puree    Other  Recommendations Oral Care Recommendations: Oral care BID;Oral care prior to ice chip/H20 Other Recommendations: Order thickener from pharmacy   Follow up Recommendations        Frequency and Duration min 1 x/week  1 week       Prognosis Prognosis for Safe Diet Advancement: Good Barriers to Reach Goals: Time post onset      Swallow Study   General Date of Onset: 03/30/19 HPI: 85 y.o. male with medical history significant for atrial fibrillation on warfarin, history of CVA, hypertension, chronic diastolic CHF, COPD and restrictive lung disease with chronic hypoxic respiratory failure, pulmonary hypertension, and severe mitral stenosis, now presenting to the emergency department with severe left hip pain after a fall at home.  Patient had been in his usual state of health and was having an uneventful day when he went to sit in a chair, missed the chair, landed on the floor, did not hit his  head or lose consciousness, but began to experience severe pain at the left hip. x-rays showed a left intertroch fx and orthopedic surgery was consulted. Type of Study: Bedside Swallow Evaluation Previous Swallow Assessment: none on file Diet Prior to this Study: Regular;Thin liquids Temperature Spikes Noted:  No Respiratory Status: Nasal cannula History of Recent Intubation: No Behavior/Cognition: Alert;Cooperative Oral Cavity Assessment: Within Functional Limits Oral Care Completed by SLP: No Oral Cavity - Dentition: Adequate natural dentition Vision: Functional for self-feeding Self-Feeding Abilities: Able to feed self Patient Positioning: Upright in bed Baseline Vocal Quality: Normal Volitional Cough: Weak Volitional Swallow: Able to elicit    Oral/Motor/Sensory Function Overall Oral Motor/Sensory Function: Generalized oral weakness   Ice Chips     Thin Liquid Thin Liquid: Impaired Presentation: Cup;Straw Pharyngeal  Phase Impairments: Wet Vocal Quality;Cough - Immediate;Cough - Delayed;Throat Clearing - Delayed;Suspected delayed Swallow;Multiple swallows    Nectar Thick Nectar Thick Liquid: Impaired Presentation: Cup Pharyngeal Phase Impairments: Suspected delayed Swallow;Multiple swallows   Honey Thick Honey Thick Liquid: Not tested   Puree Puree: Within functional limits   Solid     Solid: Within functional limits      Raegan Sipp E Kaiya Boatman MA, CCC-SLP Acute Rehabilitation Services 03/30/2019,2:47 PM

## 2019-03-30 NOTE — Progress Notes (Signed)
Initial Nutrition Assessment  DOCUMENTATION CODES:   Not applicable  INTERVENTION:  Provide Ensure Enlive po BID, each supplement provides 350 kcal and 20 grams of protein  Encourage adequate PO intake.   NUTRITION DIAGNOSIS:   Increased nutrient needs related to chronic illness(CHF, COPD) as evidenced by estimated needs.  GOAL:   Patient will meet greater than or equal to 90% of their needs  MONITOR:   PO intake, Supplement acceptance, Skin, Weight trends, Labs, I & O's  REASON FOR ASSESSMENT:   Consult Hip fracture protocol  ASSESSMENT:   85 y.o. male with medical history significant for atrial fibrillation on warfarin, history of CVA, hypertension, chronic diastolic CHF, COPD and restrictive lung disease with chronic hypoxic respiratory failure, pulmonary hypertension, and severe mitral stenosis, now presenting to the emergency department with severe left hip pain after a fall at home. Radiographs of the left hip demonstrate comminuted, impacted, and slightly varus angulated intertrochanteric left femur fracture.  Plans for surgery tomorrow. Diet has just been advanced. Pt reports hunger and good appetite with no difficulties. Usual body weight reported to be ~136 lbs. No recent weight recorded. Pt weighed on bed scale which revealed 135 lbs. RD to order nutritional supplements to aid in increased caloric and protein needs as well as in pre/post op healing.   NUTRITION - FOCUSED PHYSICAL EXAM:  Depletion likely related to the natural aging process.     Most Recent Value  Orbital Region  Unable to assess  Upper Arm Region  Moderate depletion  Thoracic and Lumbar Region  No depletion  Buccal Region  Unable to assess  Temple Region  Moderate depletion  Clavicle Bone Region  Moderate depletion  Clavicle and Acromion Bone Region  Moderate depletion  Scapular Bone Region  Unable to assess  Dorsal Hand  Unable to assess  Patellar Region  Moderate depletion  Anterior Thigh  Region  Moderate depletion  Posterior Calf Region  Moderate depletion  Edema (RD Assessment)  None  Hair  Reviewed  Eyes  Reviewed  Mouth  Reviewed  Skin  Reviewed  Nails  Reviewed     Labs and medications reviewed.   Diet Order:   Diet Order            Diet heart healthy/carb modified Room service appropriate? Yes; Fluid consistency: Thin  Diet effective now              EDUCATION NEEDS:   Not appropriate for education at this time  Skin:  Skin Assessment: Reviewed RN Assessment  Last BM:  Unknown  Height:   Ht Readings from Last 1 Encounters:  03/04/19 5\' 7"  (1.702 m)    Weight:   Wt Readings from Last 1 Encounters:  03/04/19 63.8 kg  03/30/19 61.36 kg (Bed scale)  Ideal Body Weight:  67.27 kg  BMI:  There is no height or weight on file to calculate BMI.  Estimated Nutritional Needs:   Kcal:  1650-1850  Protein:  75-85 grams  Fluid:  1.6 - 2.8 L/day    Corrin Parker, MS, RD, LDN Pager # [phone] After hours/ weekend pager # [phone]

## 2019-03-30 NOTE — ED Triage Notes (Signed)
Per [facility], pt w/ a c/o possible left hip fracture sustained from a fall. Shortening and lateral rotation noted to hip. PMS intact. Pt denied pain or LOC. No head or back pain. Hip binding in place with a sheet. PMS intact.

## 2019-03-31 ENCOUNTER — Encounter (HOSPITAL_COMMUNITY): Payer: Self-pay | Admitting: Surgery

## 2019-03-31 LAB — CBC WITH DIFFERENTIAL/PLATELET
Abs Immature Granulocytes: 0.08 10*3/uL — ABNORMAL HIGH (ref 0.00–0.07)
Basophils Absolute: 0.1 10*3/uL (ref 0.0–0.1)
Basophils Relative: 1 %
Eosinophils Absolute: 0.4 10*3/uL (ref 0.0–0.5)
Eosinophils Relative: 3 %
HCT: 33.8 % — ABNORMAL LOW (ref 39.0–52.0)
Hemoglobin: 10.8 g/dL — ABNORMAL LOW (ref 13.0–17.0)
Immature Granulocytes: 1 %
Lymphocytes Relative: 7 %
Lymphs Abs: 1.1 10*3/uL (ref 0.7–4.0)
MCH: 26.9 pg (ref 26.0–34.0)
MCHC: 32 g/dL (ref 30.0–36.0)
MCV: 84.1 fL (ref 80.0–100.0)
Monocytes Absolute: 1.6 10*3/uL — ABNORMAL HIGH (ref 0.1–1.0)
Monocytes Relative: 11 %
Neutro Abs: 11.6 10*3/uL — ABNORMAL HIGH (ref 1.7–7.7)
Neutrophils Relative %: 77 %
Platelets: 157 10*3/uL (ref 150–400)
RBC: 4.02 MIL/uL — ABNORMAL LOW (ref 4.22–5.81)
RDW: 16.8 % — ABNORMAL HIGH (ref 11.5–15.5)
WBC: 14.9 10*3/uL — ABNORMAL HIGH (ref 4.0–10.5)
nRBC: 0 % (ref 0.0–0.2)

## 2019-03-31 LAB — RENAL FUNCTION PANEL
Albumin: 2.7 g/dL — ABNORMAL LOW (ref 3.5–5.0)
Anion gap: 11 (ref 5–15)
BUN: 34 mg/dL — ABNORMAL HIGH (ref 8–23)
CO2: 29 mmol/L (ref 22–32)
Calcium: 8.7 mg/dL — ABNORMAL LOW (ref 8.9–10.3)
Chloride: 99 mmol/L (ref 98–111)
Creatinine, Ser: 0.98 mg/dL (ref 0.61–1.24)
GFR calc Af Amer: >60 mL/min (ref >60)
GFR calc non Af Amer: >60 mL/min (ref >60)
Glucose, Bld: 136 mg/dL — ABNORMAL HIGH (ref 70–99)
Phosphorus: 3.3 mg/dL (ref 2.5–4.6)
Potassium: 4.2 mmol/L (ref 3.5–5.1)
Sodium: 139 mmol/L (ref 135–145)

## 2019-03-31 LAB — PROTIME-INR
INR: 1.9 — ABNORMAL HIGH (ref 0.8–1.2)
INR: 1.5 — ABNORMAL HIGH (ref 0.8–1.2)
Prothrombin Time: 21.9 seconds — ABNORMAL HIGH (ref 11.4–15.2)
Prothrombin Time: 18.1 seconds — ABNORMAL HIGH (ref 11.4–15.2)

## 2019-03-31 LAB — GLUCOSE, CAPILLARY
Glucose-Capillary: 100 mg/dL — ABNORMAL HIGH (ref 70–99)
Glucose-Capillary: 121 mg/dL — ABNORMAL HIGH (ref 70–99)
Glucose-Capillary: 132 mg/dL — ABNORMAL HIGH (ref 70–99)
Glucose-Capillary: 135 mg/dL — ABNORMAL HIGH (ref 70–99)
Glucose-Capillary: 145 mg/dL — ABNORMAL HIGH (ref 70–99)
Glucose-Capillary: 167 mg/dL — ABNORMAL HIGH (ref 70–99)

## 2019-03-31 LAB — MAGNESIUM: Magnesium: 2.4 mg/dL (ref 1.7–2.4)

## 2019-03-31 MED ORDER — ENSURE PRE-SURGERY PO LIQD
296.0000 mL | Freq: Once | ORAL | Status: DC
  Filled 2019-03-31: qty 296

## 2019-03-31 MED ORDER — VITAMIN K1 10 MG/ML IJ SOLN
2.0000 mg | Freq: Once | INTRAVENOUS | Status: AC
  Administered 2019-03-31: 2 mg via INTRAVENOUS
  Filled 2019-03-31: qty 0.2

## 2019-03-31 MED ORDER — CHLORHEXIDINE GLUCONATE 4 % EX LIQD: 60.0000 mL | Freq: Once | CUTANEOUS | Status: DC

## 2019-03-31 MED ORDER — CLINDAMYCIN PHOSPHATE 900 MG/50ML IV SOLN
900.0000 mg | INTRAVENOUS | Status: DC
  Filled 2019-03-31: qty 50

## 2019-03-31 MED ORDER — POVIDONE-IODINE 10 % EX SWAB: 2.0000 "application " | Freq: Once | CUTANEOUS | Status: DC

## 2019-03-31 MED ORDER — MUPIROCIN 2 % EX OINT: 1.0000 "application " | TOPICAL_OINTMENT | Freq: Two times a day (BID) | CUTANEOUS | Status: DC

## 2019-03-31 MED ORDER — CHLORHEXIDINE GLUCONATE CLOTH 2 % EX PADS
6.0000 | MEDICATED_PAD | Freq: Every day | CUTANEOUS | Status: DC
  Administered 2019-04-01: 6 via TOPICAL

## 2019-03-31 MED ORDER — MUPIROCIN 2 % EX OINT
1.0000 "application " | TOPICAL_OINTMENT | Freq: Two times a day (BID) | CUTANEOUS | Status: DC
  Administered 2019-03-31 – 2019-04-04 (×8): 1 via NASAL
  Filled 2019-03-31: qty 22

## 2019-03-31 NOTE — Progress Notes (Signed)
Dr. Lyla Glassing notified of PT/ INR and is ok with proceeding.

## 2019-03-31 NOTE — Progress Notes (Signed)
PROGRESS NOTE    RUEBEN Frye  [id number] DOB: 1925-03-05 DOA: 03/30/2019 PCP: Nickola Major, MD   Brief Narrative: Aaron Frye is a 85 y.o. male with medical history significant for atrial fibrillation on warfarin, history of CVA, hypertension, chronic diastolic CHF, COPD and restrictive lung disease with chronic hypoxic respiratory failure, pulmonary hypertension, and severe mitral stenosis. Patient presented after a fall and found to have a left hip fracture.   Assessment & Plan:   Principal Problem:   Closed left hip fracture, initial encounter [facility]) Active Problems:   Essential hypertension   Atrial fibrillation, chronic   History of stroke   Diastolic CHF, acute on chronic (HCC)   Hyperglycemia   COPD (chronic obstructive pulmonary disease) (HCC)   Chronic respiratory failure with hypoxia (HCC)   Closed left hip fracture Patient is normally ambulatory and active with his ADLs. He also takes care of his wife, who has dementia. Plan for surgery per orthopedic surgery. INR of 1.9 today -NPO -Orthopedic surgery recommendations -Consult CM for home health; no consideration for SNF on discharge per discussion with daughter/patient  Chronic atrial fibrillation Patient is on metoprolol and Coumadin as an outpatient. Rate controlled currently -Continue metoprolol -Continue to hold Coumadin; heparin drip for INR <2 and per orthopedic surgery  Essential hypertension Stable -Continue metoprolol  Chronic diastolic heart failure Last EF of 65-70% in 2019.  Chronic respiratory failure with hypoxia Stable. -Continue O2 supplementation  Mitral valve stenosis Aortic valve stneosis Noted on last Transthoracic Echocardiogram.   PA artery hypertension Noted on last Transthoracic Echocardiogram.  Hyperlipidemia -Continue Lipitor    DVT prophylaxis: SCDs Code Status:   Code Status: Full Code Family Communication: Daughter at bedside Disposition Plan:  Discharge pending surgery management   Consultants:   Orthopedic surgery  Procedures:   None  Antimicrobials:  None    Subjective: Pain is manageable.  Objective: Vitals:   03/30/19 1538 03/30/19 2012 03/31/19 0353 03/31/19 0756  BP: 109/64 (!) 106/58 (!) 103/56 99/70  Pulse: 76 71 76 72  Resp: 14 18  14   Temp: 98.7 F (37.1 C) 98.6 F (37 C) 97.7 F (36.5 C) 98.3 F (36.8 C)  TempSrc: Oral Oral Oral Oral  SpO2: 90% 99% 97% 95%    Intake/Output Summary (Last 24 hours) at 03/31/2019 1338 Last data filed at 03/31/2019 0900 Gross per 24 hour  Intake 0 ml  Output 200 ml  Net -200 ml   There were no vitals filed for this visit.  Examination:  General exam: Appears calm and comfortable Respiratory system: Clear to auscultation. Respiratory effort normal. Cardiovascular system: S1 & S2 heard, RRR. 2/6 systolic murmur. Gastrointestinal system: Abdomen is nondistended, soft and nontender. No organomegaly or masses felt. Normal bowel sounds heard. Central nervous system: Alert and oriented. No focal neurological deficits. Hard of hearing. Extremities: No edema. No calf tenderness. Ne overlying hematoma noted Skin: No cyanosis. No rashes Psychiatry: Judgement and insight appear normal. Mood & affect appropriate.     Data Reviewed: I have personally reviewed following labs and imaging studies  CBC: Recent Labs  Lab 03/30/19 0205 03/31/19 0453  WBC 13.6* 14.9*  NEUTROABS 11.4* 11.6*  HGB 13.3 10.8*  HCT 42.1 33.8*  MCV 85.4 84.1  PLT 211 [id number]   Basic Metabolic Panel: Recent Labs  Lab 03/30/19 0205 03/31/19 0453  NA 140 139  K 4.3 4.2  CL 98 99  CO2 30 29  GLUCOSE 170* 136*  BUN 34* 34*  CREATININE  1.00 0.98  CALCIUM 9.4 8.7*  MG  --  2.4  PHOS  --  3.3   GFR: CrCl cannot be calculated (Unknown ideal weight.). Liver Function Tests: Recent Labs  Lab 03/30/19 0205 03/31/19 0453  AST 31  --   ALT 30  --   ALKPHOS 123  --   BILITOT 1.0  --     PROT 8.3*  --   ALBUMIN 3.7 2.7*   No results for input(s): LIPASE, AMYLASE in the last 168 hours. No results for input(s): AMMONIA in the last 168 hours. Coagulation Profile: Recent Labs  Lab 03/30/19 0205 03/30/19 0541 03/30/19 1744 03/31/19 0453  INR 2.4* 2.6* 2.7* 1.9*   Cardiac Enzymes: No results for input(s): CKTOTAL, CKMB, CKMBINDEX, TROPONINI in the last 168 hours. BNP (last 3 results) No results for input(s): PROBNP in the last 8760 hours. HbA1C: Recent Labs    03/30/19 0541  HGBA1C 6.6*   CBG: Recent Labs  Lab 03/30/19 2116 03/30/19 2357 03/31/19 0521 03/31/19 0753 03/31/19 1159  GLUCAP 151* 145* 132* 135* 121*   Lipid Profile: No results for input(s): CHOL, HDL, LDLCALC, TRIG, CHOLHDL, LDLDIRECT in the last 72 hours. Thyroid Function Tests: No results for input(s): TSH, T4TOTAL, FREET4, T3FREE, THYROIDAB in the last 72 hours. Anemia Panel: No results for input(s): VITAMINB12, FOLATE, FERRITIN, TIBC, IRON, RETICCTPCT in the last 72 hours. Sepsis Labs: No results for input(s): PROCALCITON, LATICACIDVEN in the last 168 hours.  Recent Results (from the past 240 hour(s))  SARS Coronavirus 2 [facility] order, Performed in [facility] hospital lab) Nasopharyngeal Nasopharyngeal Swab     Status: None   Collection Time: 03/30/19  2:50 AM   Specimen: Nasopharyngeal Swab  Result Value Ref Range Status   SARS Coronavirus 2 NEGATIVE NEGATIVE Final    Comment: (NOTE) If result is NEGATIVE SARS-CoV-2 target nucleic acids are NOT DETECTED. The SARS-CoV-2 RNA is generally detectable in upper and lower  respiratory specimens during the acute phase of infection. The lowest  concentration of SARS-CoV-2 viral copies this assay can detect is 250  copies / mL. A negative result does not preclude SARS-CoV-2 infection  and should not be used as the sole basis for treatment or other  patient management decisions.  A negative result may occur with  improper specimen  collection / handling, submission of specimen other  than nasopharyngeal swab, presence of viral mutation(s) within the  areas targeted by this assay, and inadequate number of viral copies  (<250 copies / mL). A negative result must be combined with clinical  observations, patient history, and epidemiological information. If result is POSITIVE SARS-CoV-2 target nucleic acids are DETECTED. The SARS-CoV-2 RNA is generally detectable in upper and lower  respiratory specimens dur ing the acute phase of infection.  Positive  results are indicative of active infection with SARS-CoV-2.  Clinical  correlation with patient history and other diagnostic information is  necessary to determine patient infection status.  Positive results do  not rule out bacterial infection or co-infection with other viruses. If result is PRESUMPTIVE POSTIVE SARS-CoV-2 nucleic acids MAY BE PRESENT.   A presumptive positive result was obtained on the submitted specimen  and confirmed on repeat testing.  While 2019 novel coronavirus  (SARS-CoV-2) nucleic acids may be present in the submitted sample  additional confirmatory testing may be necessary for epidemiological  and / or clinical management purposes  to differentiate between  SARS-CoV-2 and other Sarbecovirus currently known to infect humans.  If clinically indicated additional  testing with an alternate test  methodology [phone]) is advised. The SARS-CoV-2 RNA is generally  detectable in upper and lower respiratory sp ecimens during the acute  phase of infection. The expected result is Negative. Fact Sheet for Patients:  StrictlyIdeas.no Fact Sheet for Healthcare Providers: BankingDealers.co.za This test is not yet approved or cleared by the [place] FDA and has been authorized for detection and/or diagnosis of SARS-CoV-2 by FDA under an Emergency Use Authorization (EUA).  This EUA will remain in effect  (meaning this test can be used) for the duration of the COVID-19 declaration under Section 564(b)(1) of the Act, 21 U.S.C. section 360bbb-3(b)(1), unless the authorization is terminated or revoked sooner. Performed at [facility], [place] [address] [place], [place] [address]   Surgical PCR screen     Status: Abnormal   Collection Time: 03/30/19  4:55 PM   Specimen: Nasal Mucosa; Nasal Swab  Result Value Ref Range Status   MRSA, PCR NEGATIVE NEGATIVE Final   Staphylococcus aureus POSITIVE (A) NEGATIVE Final    Comment: (NOTE) The Xpert SA Assay (FDA approved for NASAL specimens in patients 18 years of age and older), is one component of a comprehensive surveillance program. It is not intended to diagnose infection nor to guide or monitor treatment. Performed at [facility], [place] [address] [place], [place] [address]          Radiology Studies: Dg Chest 1 View  Result Date: 03/30/2019 CLINICAL DATA:  Hip fracture, fall EXAM: CHEST  1 VIEW COMPARISON:  Radiograph Nov 15, 2017, CT October 01, 2014 FINDINGS: There are diffuse chronic interstitial changes throughout the lungs. A bandlike area of atelectasis or scarring is present in the right lung base. More hazy opacities are present in the lower lungs with some indistinctness of the vascularity and central cuffing with fissural and septal thickening. Slight blunting of the right costophrenic sulcus suggests a small effusion. No pneumothorax. Cardiomediastinal contours are stable including a calcified tortuous aorta. Degenerative changes are present in the imaged spine and shoulders. No acute osseous or soft tissue abnormality is visualized though the bones are diffusely demineralized. IMPRESSION: 1. Findings consistent with interstitial lung disease/pulmonary edema superimposed on chronic interstitial lung disease. 2. A bandlike area of atelectasis or scarring in the right lung base. 3. Possible small right pleural effusion. 4. No  acute traumatic abnormality of the chest wall. Electronically Signed   By: Lovena Le M.D.   On: 03/30/2019 02:37   Ct Hip Left Wo Contrast  Result Date: 03/30/2019 CLINICAL DATA:  Fracture of the left hip, requested by orthopedics preoperatively EXAM: CT OF THE LEFT HIP WITHOUT CONTRAST TECHNIQUE: Multidetector CT imaging of the left hip was performed according to the standard protocol. Multiplanar CT image reconstructions were also generated. COMPARISON:  Same day radiographs FINDINGS: Bones/Joint/Cartilage Redemonstration of the comminuted, impacted intertrochanteric left femur fracture. One of the larger displaced fracture fragments includes much of the footprint of the lesser trochanter. Additional fracture fragments included portion of the insertion sites of the greater trochanter as well. The femoral head remains normally located. A moderate right hip effusion is present, likely reflecting some hemarthrosis given increased attenuation. Remaining bones of the pelvis and sacrum are intact. No diastatic widening of the symphysis pubis or visible portion of the left SI joint. Ligaments Suboptimally assessed by CT. Muscles and Tendons Mild thickening and stranding is present about the quadratus femoris and gemellus muscle bellies. Suspect mild retraction of the fractured lesser trochanter  footprint by the iliopsoas insertion with mild adjacent stranding. No other tendinous discontinuity or retraction is clearly evident. Soft tissues Diffuse soft tissue swelling of the left hip. Left hip hemarthrosis, as above. Included portions of the pelvis demonstrate atherosclerotic calcification of inflow and proximal outflow vessels. Surgical clips are present in the pelvis. Scattered colonic diverticula without focal pericolonic inflammation to suggest diverticulitis. Corporal calcifications of the external genitalia are present as well. IMPRESSION: 1. Comminuted, impacted intertrochanteric left femur fracture as  described above. 2. Suspect mild retraction of the fractured lesser trochanter by the iliopsoas with mild adjacent stranding. 3. Thickening and likely intramuscular hemorrhage within the quadratus femoris and gemellus. 4. Moderate right hip effusion, likely reflecting some hemarthrosis given increased attenuation. Electronically Signed   By: Lovena Le M.D.   On: 03/30/2019 03:38   Dg Knee Complete 4 Views Left  Result Date: 03/30/2019 CLINICAL DATA:  Fall, left femur fracture EXAM: LEFT KNEE - COMPLETE 4+ VIEW COMPARISON:  None FINDINGS: The osseous structures appear diffusely demineralized which may limit detection of small or nondisplaced fractures. Included portions of the distal femur and proximal tibia and fibula are intact. There is medial compartmental narrowing with subchondral sclerotic changes. Additional milder degenerative changes are noted in the lateral femorotibial and patellofemoral compartments. Trace effusion is present with mild distal quadriceps tendon thickening. Minimal soft tissue swelling is seen anteriorly. Vascular calcium is noted posterior to the knee. IMPRESSION: 1. Diffuse bone demineralization. No definite fracture or traumatic malalignment. 2. Trace effusion and mild thickening of the distal quadriceps tendon. 3. Mild to moderate tricompartmental degenerative changes, worst in the medial compartment. Electronically Signed   By: Lovena Le M.D.   On: 03/30/2019 02:41   Dg Hip Unilat With Pelvis 2-3 Views Left  Result Date: 03/30/2019 CLINICAL DATA:  Hip fracture, fall EXAM: DG HIP (WITH OR WITHOUT PELVIS) 2-3V LEFT COMPARISON:  Radiograph 09/23/2014 FINDINGS: Bones are diffusely osteopenic which may limit detection of subtle, nondisplaced fractures. The bones of the pelvis remain congruent without diastasis or abnormal widening. The femoral heads remain normally located. There is a comminuted, impacted and slightly valgus angulated intertrochanteric left femur fracture.  There is prior right intramedullary nail and transcervical fixation screw placement. Vascular calcium noted in the medial thighs. Bowel gas pattern is unremarkable. Surgical clips are present in the low pelvis. IMPRESSION: 1. Comminuted, impacted and slightly valgus angulated intertrochanteric left femur fracture. 2. Bones are diffusely osteopenic which may limit detection of additional more subtle, nondisplaced fractures. Electronically Signed   By: Lovena Le M.D.   On: 03/30/2019 02:40        Scheduled Meds:  atorvastatin  40 mg Oral q1800   chlorhexidine  60 mL Topical Once   feeding supplement (ENSURE ENLIVE)  237 mL Oral BID BM   feeding supplement  296 mL Oral Once   insulin aspart  0-8 Units Subcutaneous Q4H   metoprolol succinate  75 mg Oral Daily   povidone-iodine  2 application Topical Once   Continuous Infusions:  clindamycin (CLEOCIN) IV       LOS: 1 day     Cordelia Poche, MD [facility] 03/31/2019, 1:38 PM  If 7PM-7AM, please contact night-coverage [email or web address]

## 2019-03-31 NOTE — Plan of Care (Signed)
  Problem: Education: Goal: Understanding of discharge needs will improve Outcome: Progressing Note: Pt's d/c needs currently being assessed on a daily basis and have been addressed during my care.    Problem: Pain Management: Goal: Pain level will decrease with appropriate interventions Outcome: Progressing Note: Pt has not complained of pain during my care.

## 2019-03-31 NOTE — Progress Notes (Signed)
SLP Cancellation Note  Patient Details Name: Aaron Frye MRN: [id number] DOB: [birth year]   Cancelled treatment:       Reason Eval/Treat Not Completed: Other (comment)Pt NPO for surgery today.  Please ensure pt's diet is written for nectar-thick liquids after surgery.  SLP will follow.  Xander Jutras L. Tivis Ringer, [place] CCC/SLP Acute Rehabilitation Services Office number [phone] Pager [phone]    Juan Quam Laurice 03/31/2019, 11:06 AM

## 2019-03-31 NOTE — Progress Notes (Signed)
Silvestre Gunner, PA notified of therapeutic PT/ INR. He will address prior to surgery.

## 2019-03-31 NOTE — Progress Notes (Signed)
Verbal orders for pm PT/INR per anesthesia.  Anesthesia would like INR to be less then 1.5, if not, please communicate with patients MD for further management.  Repeat PT/INR in am.  Communicated this with floor RN [place].

## 2019-03-31 NOTE — Progress Notes (Signed)
Notified daughter of surgery time and surgeon performing

## 2019-03-31 NOTE — Anesthesia Preprocedure Evaluation (Addendum)
Anesthesia Evaluation  Patient identified by MRN, date of birth, ID band Patient awake    Reviewed: Allergy & Precautions, H&P , NPO status , Patient's Chart, lab work & pertinent test results  Airway Mallampati: I  TM Distance: >3 FB Neck ROM: Full    Dental  (+) Teeth Intact, Dental Advisory Given   Pulmonary COPD,  oxygen dependent,  Duonebs, albuterol PRN [place] 2LPM   breath sounds clear to auscultation       Cardiovascular hypertension, Pt. on medications and Pt. on home beta blockers +CHF  + dysrhythmias (?hx of A Fib) Atrial Fibrillation + Valvular Problems/Murmurs  Rhythm:Regular Rate:Normal  Last echo 07/2017: - Left ventricle: The cavity size was normal. Wall thickness was   increased in a pattern of mild LVH. Systolic function was   vigorous. The estimated ejection fraction was in the range of 65%   to 70%. Wall motion was normal; there were no regional wall   motion abnormalities. - Aortic valve: Moderately calcified annulus. Severely thickened,   severely calcified leaflets. There was mild stenosis. - Mitral valve: Moderately dilated annulus. Severely calcified   leaflets . The findings are consistent with severe stenosis.   There was moderate regurgitation. Valve area by pressure   half-time: 1.68 cm^2. Valve area by continuity equation (using   LVOT flow): 0.7 cm^2. - Left atrium: The atrium was severely dilated. - Right ventricle: The cavity size was mildly dilated. Systolic   function was mildly reduced. - Right atrium: The atrium was mildly to moderately dilated. - Tricuspid valve: There was moderate-severe regurgitation. - Pulmonary arteries: Systolic pressure was moderately to severely   increased. PA peak pressure: 60 mm Hg (S).   Rate controlled Afib on warfarin CHFpEF Pulmonary HTN Severe MS, Mod MR- not surgical candidate    Neuro/Psych TIAnegative psych ROS   GI/Hepatic negative GI ROS, Neg  liver ROS,   Endo/Other  negative endocrine ROSa1c 6.2  Renal/GU negative Renal ROS   Hx prostate ca    Musculoskeletal  (+) Arthritis , Osteoarthritis,  L femur fx s/p fall from standing  Hx R femur fx 2016 s/p nailing   Abdominal Normal abdominal exam  (+)   Peds  Hematology negative hematology ROS (+)   Anesthesia Other Findings   Reproductive/Obstetrics negative OB ROS                            Anesthesia Physical  Anesthesia Plan  ASA: IV  Anesthesia Plan: Epidural   Post-op Pain Management:    Induction: Intravenous  PONV Risk Score and Plan: 2 and Ondansetron, Dexamethasone and Treatment may vary due to age or medical condition  Airway Management Planned: Simple Face Mask  Additional Equipment: None  Intra-op Plan:   Post-operative Plan:   Informed Consent: I have reviewed the patients History and Physical, chart, labs and discussed the procedure including the risks, benefits and alternatives for the proposed anesthesia with the patient or authorized representative who has indicated his/her understanding and acceptance.     Dental advisory given  Plan Discussed with: CRNA, Anesthesiologist and Surgeon  Anesthesia Plan Comments: (Risk of epidural hematoma discussed with the patient and family.  INR currently 1.4.  Pt has stopped his warfarin.  Received vitamin K.  Given his pulmonary HTN and cardiac problems, we feel a neuraxial technique has the best risk/benefit profile.)       Anesthesia Quick Evaluation

## 2019-04-01 ENCOUNTER — Inpatient Hospital Stay (HOSPITAL_COMMUNITY): Payer: Medicare Other | Admitting: Anesthesiology

## 2019-04-01 ENCOUNTER — Encounter (HOSPITAL_COMMUNITY): Payer: Self-pay | Admitting: *Deleted

## 2019-04-01 ENCOUNTER — Inpatient Hospital Stay (HOSPITAL_COMMUNITY): Payer: Medicare Other

## 2019-04-01 ENCOUNTER — Encounter (HOSPITAL_COMMUNITY)
Admission: EM | Disposition: A | Discharge status: Home Health | Payer: Self-pay | Source: Home / Self Care | Attending: Family Medicine

## 2019-04-01 HISTORY — PX: FEMUR IM NAIL: SHX1597

## 2019-04-01 LAB — PROTIME-INR
INR: 1.5 — ABNORMAL HIGH (ref 0.8–1.2)
INR: 1.4 — ABNORMAL HIGH (ref 0.8–1.2)
Prothrombin Time: 17.9 seconds — ABNORMAL HIGH (ref 11.4–15.2)
Prothrombin Time: 16.9 seconds — ABNORMAL HIGH (ref 11.4–15.2)

## 2019-04-01 LAB — GLUCOSE, CAPILLARY
Glucose-Capillary: 115 mg/dL — ABNORMAL HIGH (ref 70–99)
Glucose-Capillary: 123 mg/dL — ABNORMAL HIGH (ref 70–99)
Glucose-Capillary: 135 mg/dL — ABNORMAL HIGH (ref 70–99)
Glucose-Capillary: 137 mg/dL — ABNORMAL HIGH (ref 70–99)
Glucose-Capillary: 160 mg/dL — ABNORMAL HIGH (ref 70–99)

## 2019-04-01 SURGERY — INSERTION, INTRAMEDULLARY ROD, FEMUR
Anesthesia: General | Site: Hip | Laterality: Left

## 2019-04-01 MED ORDER — LACTATED RINGERS IV SOLN
INTRAVENOUS | Status: DC | PRN
  Administered 2019-04-01: 20:00:00 via INTRAVENOUS

## 2019-04-01 MED ORDER — 0.9 % SODIUM CHLORIDE (POUR BTL) OPTIME
TOPICAL | Status: DC | PRN
  Administered 2019-04-01: 1000 mL

## 2019-04-01 MED ORDER — VANCOMYCIN HCL 1000 MG IV SOLR
INTRAVENOUS | Status: DC | PRN
  Administered 2019-04-01: 1000 mg via INTRAVENOUS

## 2019-04-01 MED ORDER — FENTANYL CITRATE (PF) 250 MCG/5ML IJ SOLN
INTRAMUSCULAR | Status: AC
  Filled 2019-04-01: qty 5

## 2019-04-01 MED ORDER — DEXAMETHASONE SODIUM PHOSPHATE 10 MG/ML IJ SOLN
INTRAMUSCULAR | Status: DC | PRN
  Administered 2019-04-01: 10 mg via INTRAVENOUS

## 2019-04-01 MED ORDER — HEPARIN (PORCINE) 25000 UT/250ML-% IV SOLN: 800.0000 [IU]/h | INTRAVENOUS | Status: DC

## 2019-04-01 MED ORDER — PHENOL 1.4 % MT LIQD: 1.0000 | OROMUCOSAL | Status: DC | PRN

## 2019-04-01 MED ORDER — VANCOMYCIN HCL IN DEXTROSE 1-5 GM/200ML-% IV SOLN
INTRAVENOUS | Status: AC
  Filled 2019-04-01: qty 200

## 2019-04-01 MED ORDER — VANCOMYCIN HCL IN DEXTROSE 1-5 GM/200ML-% IV SOLN
1000.0000 mg | Freq: Two times a day (BID) | INTRAVENOUS | Status: AC
  Administered 2019-04-02: 1000 mg via INTRAVENOUS
  Filled 2019-04-01: qty 200

## 2019-04-01 MED ORDER — PROPOFOL 10 MG/ML IV BOLUS
INTRAVENOUS | Status: DC | PRN
  Administered 2019-04-01: 10 mg via INTRAVENOUS
  Administered 2019-04-01 (×2): 20 mg via INTRAVENOUS

## 2019-04-01 MED ORDER — ONDANSETRON HCL 4 MG/2ML IJ SOLN
INTRAMUSCULAR | Status: AC
  Filled 2019-04-01: qty 4

## 2019-04-01 MED ORDER — FENTANYL CITRATE (PF) 100 MCG/2ML IJ SOLN: 25.0000 ug | INTRAMUSCULAR | Status: DC | PRN

## 2019-04-01 MED ORDER — DOCUSATE SODIUM 100 MG PO CAPS
100.0000 mg | ORAL_CAPSULE | Freq: Two times a day (BID) | ORAL | Status: DC
  Administered 2019-04-02 – 2019-04-04 (×5): 100 mg via ORAL
  Filled 2019-04-01 (×5): qty 1

## 2019-04-01 MED ORDER — FENTANYL CITRATE (PF) 250 MCG/5ML IJ SOLN
INTRAMUSCULAR | Status: DC | PRN
  Administered 2019-04-01: 50 ug via INTRAVENOUS
  Administered 2019-04-01 (×2): 25 ug via INTRAVENOUS

## 2019-04-01 MED ORDER — ETOMIDATE 2 MG/ML IV SOLN
INTRAVENOUS | Status: DC | PRN
  Administered 2019-04-01: 20 mg via INTRAVENOUS

## 2019-04-01 MED ORDER — LACTATED RINGERS IV SOLN: INTRAVENOUS | Status: DC

## 2019-04-01 MED ORDER — DEXAMETHASONE SODIUM PHOSPHATE 10 MG/ML IJ SOLN
INTRAMUSCULAR | Status: AC
  Filled 2019-04-01: qty 2

## 2019-04-01 MED ORDER — SODIUM CHLORIDE 0.9 % IV SOLN
INTRAVENOUS | Status: DC | PRN
  Administered 2019-04-01: 20:00:00 25 ug/min via INTRAVENOUS

## 2019-04-01 MED ORDER — ENOXAPARIN SODIUM 40 MG/0.4ML ~~LOC~~ SOLN
40.0000 mg | SUBCUTANEOUS | Status: DC
  Administered 2019-04-02 – 2019-04-04 (×3): 40 mg via SUBCUTANEOUS
  Filled 2019-04-01 (×3): qty 0.4

## 2019-04-01 MED ORDER — ONDANSETRON HCL 4 MG/2ML IJ SOLN: 4.0000 mg | Freq: Four times a day (QID) | INTRAMUSCULAR | Status: DC | PRN

## 2019-04-01 MED ORDER — ONDANSETRON HCL 4 MG/2ML IJ SOLN: 4.0000 mg | Freq: Once | INTRAMUSCULAR | Status: DC | PRN

## 2019-04-01 MED ORDER — METOCLOPRAMIDE HCL 5 MG/ML IJ SOLN: 5.0000 mg | Freq: Three times a day (TID) | INTRAMUSCULAR | Status: DC | PRN

## 2019-04-01 MED ORDER — PROPOFOL 10 MG/ML IV BOLUS
INTRAVENOUS | Status: AC
  Filled 2019-04-01: qty 20

## 2019-04-01 MED ORDER — ONDANSETRON HCL 4 MG PO TABS: 4.0000 mg | ORAL_TABLET | Freq: Four times a day (QID) | ORAL | Status: DC | PRN

## 2019-04-01 MED ORDER — PHENYLEPHRINE HCL (PRESSORS) 10 MG/ML IV SOLN
INTRAVENOUS | Status: DC | PRN
  Administered 2019-04-01: 120 ug via INTRAVENOUS

## 2019-04-01 MED ORDER — WARFARIN - PHARMACIST DOSING INPATIENT: Freq: Every day | Status: DC

## 2019-04-01 MED ORDER — MENTHOL 3 MG MT LOZG: 1.0000 | LOZENGE | OROMUCOSAL | Status: DC | PRN

## 2019-04-01 MED ORDER — ROPIVACAINE HCL 7.5 MG/ML IJ SOLN
INTRAMUSCULAR | Status: DC | PRN
  Administered 2019-04-01: 2 mL via PERINEURAL
  Administered 2019-04-01 (×3): 4 mL via PERINEURAL

## 2019-04-01 MED ORDER — ONDANSETRON HCL 4 MG/2ML IJ SOLN
INTRAMUSCULAR | Status: DC | PRN
  Administered 2019-04-01: 4 mg via INTRAVENOUS

## 2019-04-01 MED ORDER — METOCLOPRAMIDE HCL 5 MG PO TABS: 5.0000 mg | ORAL_TABLET | Freq: Three times a day (TID) | ORAL | Status: DC | PRN

## 2019-04-01 SURGICAL SUPPLY — 52 items
ALCOHOL 70% 16 OZ (MISCELLANEOUS) ×3 IMPLANT
BIT DRILL AO GAMMA 4.2X340 (BIT) ×2 IMPLANT
BNDG COHESIVE 4X5 TAN STRL (GAUZE/BANDAGES/DRESSINGS) ×3 IMPLANT
CATH COUDE FOLEY 5CC 14FR (CATHETERS) ×2 IMPLANT
CHLORAPREP W/TINT 26 (MISCELLANEOUS) ×3 IMPLANT
COVER PERINEAL POST (MISCELLANEOUS) ×3 IMPLANT
COVER SURGICAL LIGHT HANDLE (MISCELLANEOUS) ×3 IMPLANT
COVER WAND RF STERILE (DRAPES) ×3 IMPLANT
DERMABOND ADVANCED (GAUZE/BANDAGES/DRESSINGS) ×6
DERMABOND ADVANCED .7 DNX12 (GAUZE/BANDAGES/DRESSINGS) ×2 IMPLANT
DRAPE C-ARM 42X72 X-RAY (DRAPES) ×3 IMPLANT
DRAPE C-ARMOR (DRAPES) ×3 IMPLANT
DRAPE IMP U-DRAPE 54X76 (DRAPES) ×6 IMPLANT
DRAPE STERI IOBAN 125X83 (DRAPES) ×3 IMPLANT
DRAPE U-SHAPE 47X51 STRL (DRAPES) ×6 IMPLANT
DRSG MEPILEX BORDER 4X4 (GAUZE/BANDAGES/DRESSINGS) ×9 IMPLANT
ELECT REM PT RETURN 9FT ADLT (ELECTROSURGICAL) ×3
ELECTRODE REM PT RTRN 9FT ADLT (ELECTROSURGICAL) ×1 IMPLANT
FACESHIELD WRAPAROUND (MASK) ×3 IMPLANT
FACESHIELD WRAPAROUND OR TEAM (MASK) ×1 IMPLANT
GLOVE BIO SURGEON STRL SZ7 (GLOVE) ×2 IMPLANT
GLOVE BIO SURGEON STRL SZ8.5 (GLOVE) ×6 IMPLANT
GLOVE BIOGEL PI IND STRL 6.5 (GLOVE) IMPLANT
GLOVE BIOGEL PI IND STRL 8.5 (GLOVE) ×1 IMPLANT
GLOVE BIOGEL PI INDICATOR 6.5 (GLOVE) ×2
GLOVE BIOGEL PI INDICATOR 8.5 (GLOVE) ×2
GLOVE INDICATOR 7.5 STRL GRN (GLOVE) ×2 IMPLANT
GOWN STRL REUS W/ TWL LRG LVL3 (GOWN DISPOSABLE) ×1 IMPLANT
GOWN STRL REUS W/TWL 2XL LVL3 (GOWN DISPOSABLE) ×3 IMPLANT
GOWN STRL REUS W/TWL LRG LVL3 (GOWN DISPOSABLE) ×2
K-WIRE  3.2X450M STR (WIRE) ×2
K-WIRE 3.2X450M STR (WIRE) ×1
KIT TURNOVER KIT B (KITS) ×3 IMPLANT
KWIRE 3.2X450M STR (WIRE) IMPLANT
MANIFOLD NEPTUNE II (INSTRUMENTS) ×3 IMPLANT
MARKER SKIN DUAL TIP RULER LAB (MISCELLANEOUS) ×3 IMPLANT
NAIL TROCH GAMMA 11X18 (Nail) ×2 IMPLANT
NS IRRIG 1000ML POUR BTL (IV SOLUTION) ×3 IMPLANT
PACK GENERAL/GYN (CUSTOM PROCEDURE TRAY) ×3 IMPLANT
PACK UNIVERSAL I (CUSTOM PROCEDURE TRAY) ×3 IMPLANT
PAD ARMBOARD 7.5X6 YLW CONV (MISCELLANEOUS) ×6 IMPLANT
SCREW LAG GAMMA 3 95MM (Screw) ×2 IMPLANT
SCREW LOCKING T2 F/T  5MMX40MM (Screw) ×2 IMPLANT
SCREW LOCKING T2 F/T 5MMX40MM (Screw) IMPLANT
SUT MNCRL AB 3-0 PS2 18 (SUTURE) ×3 IMPLANT
SUT MNCRL AB 3-0 PS2 27 (SUTURE) ×3 IMPLANT
SUT MON AB 2-0 CT1 27 (SUTURE) ×3 IMPLANT
SUT MON AB 2-0 CT1 36 (SUTURE) ×3 IMPLANT
SUT VIC AB 1 CT1 27 (SUTURE) ×3
SUT VIC AB 1 CT1 27XBRD ANBCTR (SUTURE) ×1 IMPLANT
TOWEL GREEN STERILE (TOWEL DISPOSABLE) ×3 IMPLANT
TOWEL GREEN STERILE FF (TOWEL DISPOSABLE) ×3 IMPLANT

## 2019-04-01 NOTE — Progress Notes (Signed)
ANTICOAGULATION CONSULT NOTE - Initial Consult  Pharmacy Consult for Heparin Indication: Afib and CVA  Allergies  Allergen Reactions  . Penicillins Rash and Other (See Comments)    Has patient had a PCN reaction causing immediate rash, facial/tongue/throat swelling, SOB or lightheadedness with hypotension: Yes Has patient had a PCN reaction causing severe rash involving mucus membranes or skin necrosis: Unk Has patient had a PCN reaction that required hospitalization: No Has patient had a PCN reaction occurring within the last 10 years: No If all of the above answers are "NO", then may proceed with Cephalosporin use.     Patient Measurements: Height: 5\' 7"  (170.2 cm) Weight: 141 lb 8.6 oz (64.2 kg) IBW/kg (Calculated) : 66.1 Heparin Dosing Weight: 63.6 kg (need updated weight)  Vital Signs: Temp: 97 F (36.1 C) (09/30 2154) Temp Source: Oral (09/30 1400) BP: 103/62 (09/30 1400) Pulse Rate: 74 (09/30 1400)  Labs: Recent Labs    03/30/19 0205  03/31/19 0453 03/31/19 2038 04/01/19 0209 04/01/19 1813  HGB 13.3  --  10.8*  --   --   --   HCT 42.1  --  33.8*  --   --   --   PLT 211  --  157  --   --   --   LABPROT 26.1*   < > 21.9* 18.1* 17.9* 16.9*  INR 2.4*   < > 1.9* 1.5* 1.5* 1.4*  CREATININE 1.00  --  0.98  --   --   --    < > = values in this interval not displayed.    Estimated Creatinine Clearance: 43.7 mL/min (by C-G formula based on SCr of 0.98 mg/dL).   Medical History: Past Medical History:  Diagnosis Date  . Atrial fibrillation ([place])    Persisent   . CVA (cerebral vascular accident) ([place])   . Femur fracture, right [facility]) March 2016  . HLD (hyperlipidemia)   . HTN (hypertension)   . Mitral stenosis   . Non-ischemic cardiomyopathy ([place])    LVEF=35-40% by echo 2009  . Prostate cancer ([place])   . TIA (transient ischemic attack)     Assessment:  CC/HPI: 85 y.o male admitted 03/30/19 with left hip fracture s/p fall at home  PMH: Afib, CVA, HTN,  chronic diastolic CHF, COPD and restrictive lung disease with chronic hypoxic respiratory failure, pulmonary hypertension, and severe mitral stenosis  Anticoag: h/o afib and CVA, on warfarin PTA. INR down to 1.5. Hgb (baseline) 13.3>>10.8 today? Hasn't even had surgery yet! Last weight 140lbs several years ago (63.6kg) 9/28: Vit K 5mg  po 9/29: Vit K 2mg  IV  PM Update: Patient now post-op. D/W Ortho MD and K Schorr with [facility]. Ortho MD wishes to restart warfarin tonight but does not wish to start heparin drip and recommends enoxaparin prophylactic dose starting tomorrow AM ~1000 instead. Patient, however still has epidural. Will defer decision on heparin drip as well as restart of warfarin until tomorrow per [facility]..   Goal of Therapy:  Heparin level 0.3-0.7 units/ml Monitor platelets by anticoagulation protocol: Yes   Plan:  No heparin drip Hold warfarin tonight F/u enoxaparin start in AM Daily INR   Samarie Pinder A. Levada Dy, PharmD, BCPS, FNKF Clinical Pharmacist [place] Please utilize Amion for appropriate phone number to reach the unit pharmacist ([place])   Theotis Burrow 04/01/2019,10:05 PM

## 2019-04-01 NOTE — Progress Notes (Addendum)
Talked to Elam Dutch, ortho PA this morning, hold off on Heparin infusion. Pt is scheduled for surgery at 1500 today. 1745 NPO post midnight maint.  Pt to short stay via bed, report was given to [facility]. Pt's daughter present.

## 2019-04-01 NOTE — Op Note (Signed)
OPERATIVE REPORT  SURGEON: Rod Can, MD   ASSISTANT: Staff.  PREOPERATIVE DIAGNOSIS: Left intertrochanteric femur fracture.   POSTOPERATIVE DIAGNOSIS: Left intertrochanteric femur fracture.   PROCEDURE: Intramedullary fixation, Left femur.   IMPLANTS: Stryker Gamma3 Hip Fracture Nail, 11 by 180 mm, 125 degrees. 10.5 x 95 mm Hip Fracture Nail Lag Screw. 5 x 40 mm distal interlocking screw 1.  ANESTHESIA:  General and Epidural  ESTIMATED BLOOD LOSS:-150 mL    ANTIBIOTICS: 900 mg clindamycin.  DRAINS: None.  COMPLICATIONS: None.   CONDITION: PACU - hemodynamically stable.[name]   BRIEF CLINICAL NOTE: Aaron Frye is a 85 y.o. male who presented with an intertrochanteric femur fracture. The patient was admitted to the hospitalist service and underwent perioperative risk stratification and medical optimization. The risks, benefits, and alternatives to the procedure were explained, and the patient elected to proceed.  PROCEDURE IN DETAIL: Surgical site was marked by myself. The patient was taken to the operating room and anesthesia was induced on the bed. The patient was then transferred to the [facility] table and the nonoperative lower extremity was scissored underneath the operative side. The fracture was reduced with traction, internal rotation, and adduction. The hip was prepped and draped in the normal sterile surgical fashion. Timeout was called verifying side and site of surgery. Preop antibiotics were given with 60 minutes of beginning the procedure.  Fluoroscopy was used to define the patient's anatomy. A 4 cm incision was made just proximal to the tip of the greater trochanter. The awl was used to obtain the standard starting point for a trochanteric entry nail under fluoroscopic control. The guidepin was placed. The entry reamer was used to open the proximal femur.  On the back table, the nail was assembled onto the jig. The nail was placed into the femur without any  difficulty. Through a separate stab incision, the cannula was placed down to the bone in preparation for the cephalomedullary device. A guidepin was placed into the femoral head using AP and lateral fluoroscopy views. The pin was measured, and then reaming was performed to the appropriate depth. The lag screw was inserted to the appropriate depth. The fracture was compressed through the jig. The setscrew was tightened and then loosened one quarter turn. A separate stab incision was created, and the distal interlocking screw was placed using standard AO technique. The jig was removed. Final AP and lateral fluoroscopy views were obtained to confirm fracture reduction and hardware placement. Tip apex distance was appropriate. There was no chondral penetration.  The wounds were copiously irrigated with saline. The wound was closed in layers with #1 Vicryl for the fascia, 2-0 Monocryl for the deep dermal layer, and 3-0 Monocryl subcuticular stitch. Glue was applied to the skin. Once the glue was fully hardened, sterile dressing was applied. The patient was then awakened from anesthesia and taken to the PACU in stable condition. Sponge needle and instrument counts were correct at the end of the case 2. There were no known complications.  We will readmit the patient to the hospitalist. Weightbearing status will be weightbearing as tolerated with a walker. We will resume coumadin with Lovenox bridge for DVT prophylaxis. The patient will work with physical therapy and undergo disposition planning.  POSTOP PLAN: Readmit to hospitalist. WBAT with walker. Resume coumadin with Lovenox bridge. Mobilize OOB with PT. Disposition planning. Return for routine postop care.

## 2019-04-01 NOTE — Progress Notes (Signed)
ANTICOAGULATION CONSULT NOTE - Initial Consult  Pharmacy Consult for Heparin Indication: Afib and CVA  Allergies  Allergen Reactions  . Penicillins Rash and Other (See Comments)    Has patient had a PCN reaction causing immediate rash, facial/tongue/throat swelling, SOB or lightheadedness with hypotension: Yes Has patient had a PCN reaction causing severe rash involving mucus membranes or skin necrosis: Unk Has patient had a PCN reaction that required hospitalization: No Has patient had a PCN reaction occurring within the last 10 years: No If all of the above answers are "NO", then may proceed with Cephalosporin use.     Patient Measurements:   Heparin Dosing Weight: 63.6 kg (need updated weight)  Vital Signs: Temp: 98.6 F (37 C) (09/30 0845) Temp Source: Oral (09/30 0845) BP: 126/61 (09/30 0845) Pulse Rate: 88 (09/30 0845)  Labs: Recent Labs    03/30/19 0205  03/31/19 0453 03/31/19 2038 04/01/19 0209  HGB 13.3  --  10.8*  --   --   HCT 42.1  --  33.8*  --   --   PLT 211  --  157  --   --   LABPROT 26.1*   < > 21.9* 18.1* 17.9*  INR 2.4*   < > 1.9* 1.5* 1.5*  CREATININE 1.00  --  0.98  --   --    < > = values in this interval not displayed.    CrCl cannot be calculated (Unknown ideal weight.).   Medical History: Past Medical History:  Diagnosis Date  . Atrial fibrillation ([place])    Persisent   . CVA (cerebral vascular accident) ([place])   . Femur fracture, right [facility]) March 2016  . HLD (hyperlipidemia)   . HTN (hypertension)   . Mitral stenosis   . Non-ischemic cardiomyopathy ([place])    LVEF=35-40% by echo 2009  . Prostate cancer ([place])   . TIA (transient ischemic attack)     Assessment:  CC/HPI: 85 y.o male admitted 03/30/19 with left hip fracture s/p fall at home  PMH: Afib, CVA, HTN, chronic diastolic CHF, COPD and restrictive lung disease with chronic hypoxic respiratory failure, pulmonary hypertension, and severe mitral stenosis  Anticoag: h/o afib  and CVA, on warfarin PTA. INR down to 1.5. Hgb (baseline) 13.3>>10.8 today? Hasn't even had surgery yet! Last weight 140lbs several years ago (63.6kg) 9/28: Vit K 5mg  po 9/29: Vit K 2mg  IV  Goal of Therapy:  Heparin level 0.3-0.7 units/ml Monitor platelets by anticoagulation protocol: Yes   Plan:  Start IV heparin at 800 units/hr  (no bolus) RN to try to get updated weight Plan OR at 1515.  F/u post-op to resume IV heparin and Coumadin   Bryson Palen S. Alford Highland, PharmD, BCPS Clinical Staff Pharmacist Eilene Ghazi Stillinger 04/01/2019,10:33 AM

## 2019-04-01 NOTE — Interval H&P Note (Signed)
History and Physical Interval Note:  04/01/2019 7:33 PM  Aaron Frye  has presented today for surgery, with the diagnosis of left intertroch fracture.  The various methods of treatment have been discussed with the patient and family. After consideration of risks, benefits and other options for treatment, the patient has consented to  Procedure(s): INTRAMEDULLARY (IM) NAIL FEMORAL (Left) as a surgical intervention.  The patient's history has been reviewed, patient examined, no change in status, stable for surgery.  I have reviewed the patient's chart and labs.  Questions were answered to the patient's satisfaction.    Surgery delayed yesterday due to lack of OR availability & INR too high for spinal.  Hilton Cork Ashdon Gillson

## 2019-04-01 NOTE — Discharge Instructions (Signed)
 Dr. Tyreon Frigon Adult Hip & Knee Specialist [facility] [address] , [place] [address] [phone]   POSTOPERATIVE DIRECTIONS    Hip Rehabilitation, Guidelines Following Surgery   WEIGHT BEARING Weight bearing as tolerated with assist device (walker, cane, etc) as directed, use it as long as suggested by your surgeon or therapist, typically at least 4-6 weeks.   HOME CARE INSTRUCTIONS  Remove items at home which could result in a fall. This includes throw rugs or furniture in walking pathways.  Continue medications as instructed at time of discharge.  You may have some home medications which will be placed on hold until you complete the course of blood thinner medication.  4 days after discharge, you may start showering. No tub baths or soaking your incisions. Do not put on socks or shoes without following the instructions of your caregivers.   Sit on chairs with arms. Use the chair arms to help push yourself up when arising.  Arrange for the use of a toilet seat elevator so you are not sitting low.   Walk with walker as instructed.  You may resume a sexual relationship in one month or when given the OK by your caregiver.  Use walker as long as suggested by your caregivers.  Avoid periods of inactivity such as sitting longer than an hour when not asleep. This helps prevent blood clots.  You may return to work once you are cleared by your surgeon.  Do not drive a car for 6 weeks or until released by your surgeon.  Do not drive while taking narcotics.  Wear elastic stockings for two weeks following surgery during the day but you may remove then at night.  Make sure you keep all of your appointments after your operation with all of your doctors and caregivers. You should call the office at the above phone number and make an appointment for approximately two weeks after the date of your surgery. Please pick up a stool softener and laxative  for home use as long as you are requiring pain medications.  ICE to the affected hip every three hours for 30 minutes at a time and then as needed for pain and swelling. Continue to use ice on the hip for pain and swelling from surgery. You may notice swelling that will progress down to the foot and ankle.  This is normal after surgery.  Elevate the leg when you are not up walking on it.   It is important for you to complete the blood thinner medication as prescribed by your doctor.  Continue to use the breathing machine which will help keep your temperature down.  It is common for your temperature to cycle up and down following surgery, especially at night when you are not up moving around and exerting yourself.  The breathing machine keeps your lungs expanded and your temperature down.  RANGE OF MOTION AND STRENGTHENING EXERCISES  These exercises are designed to help you keep full movement of your hip joint. Follow your caregiver's or physical therapist's instructions. Perform all exercises about fifteen times, three times per day or as directed. Exercise both hips, even if you have had only one joint replacement. These exercises can be done on a training (exercise) mat, on the floor, on a table or on a bed. Use whatever works the best and is most comfortable for you. Use music or television while you are exercising so that the exercises are a pleasant break in your day. This   will make your life better with the exercises acting as a break in routine you can look forward to.  Lying on your back, slowly slide your foot toward your buttocks, raising your knee up off the floor. Then slowly slide your foot back down until your leg is straight again.  Lying on your back spread your legs as far apart as you can without causing discomfort.  Lying on your side, raise your upper leg and foot straight up from the floor as far as is comfortable. Slowly lower the leg and repeat.  Lying on your back, tighten up the  muscle in the front of your thigh (quadriceps muscles). You can do this by keeping your leg straight and trying to raise your heel off the floor. This helps strengthen the largest muscle supporting your knee.  Lying on your back, tighten up the muscles of your buttocks both with the legs straight and with the knee bent at a comfortable angle while keeping your heel on the floor.   SKILLED REHAB INSTRUCTIONS: If the patient is transferred to a skilled rehab facility following release from the hospital, a list of the current medications will be sent to the facility for the patient to continue.  When discharged from the skilled rehab facility, please have the facility set up the patient's Home Health Physical Therapy prior to being released. Also, the skilled facility will be responsible for providing the patient with their medications at time of release from the facility to include their pain medication and their blood thinner medication. If the patient is still at the rehab facility at time of the two week follow up appointment, the skilled rehab facility will also need to assist the patient in arranging follow up appointment in our office and any transportation needs.  MAKE SURE YOU:  Understand these instructions.  Will watch your condition.  Will get help right away if you are not doing well or get worse.  Pick up stool softner and laxative for home use following surgery while on pain medications. Daily dry dressing changes as needed. In 4 days, you may remove your dressings and begin taking showers - no tub baths or soaking the incisions. Continue to use ice for pain and swelling after surgery. Do not use any lotions or creams on the incision until instructed by your surgeon.   

## 2019-04-01 NOTE — Progress Notes (Signed)
PROGRESS NOTE    Aaron Frye  [id number] DOB: May 12, 1925 DOA: 03/30/2019 PCP: Nickola Major, MD   Brief Narrative: Aaron Frye is a 85 y.o. male with medical history significant for atrial fibrillation on warfarin, history of CVA, hypertension, chronic diastolic CHF, COPD and restrictive lung disease with chronic hypoxic respiratory failure, pulmonary hypertension, and severe mitral stenosis. Patient presented after a fall and found to have a left hip fracture.   Assessment & Plan:   Principal Problem:   Closed left hip fracture, initial encounter [facility]) Active Problems:   Essential hypertension   Atrial fibrillation, chronic   History of stroke   Diastolic CHF, acute on chronic (HCC)   Hyperglycemia   COPD (chronic obstructive pulmonary disease) (HCC)   Chronic respiratory failure with hypoxia (HCC)   Closed left hip fracture Patient is normally ambulatory and active with his ADLs. He also takes care of his wife, who has dementia. Plan for surgery per orthopedic surgery. INR of 1.9 today -NPO -Orthopedic surgery recommendations -Consult CM for home health; no consideration for SNF on discharge per discussion with daughter/patient  Chronic atrial fibrillation Patient is on metoprolol and Coumadin as an outpatient. Rate controlled currently -Continue metoprolol -Continue to hold Coumadin; heparin drip for INR <2 and per orthopedic surgery  Essential hypertension Stable -Continue metoprolol  Chronic diastolic heart failure Last EF of 65-70% in 2019.  Chronic respiratory failure with hypoxia Stable. -Continue O2 supplementation  Mitral valve stenosis Aortic valve stneosis Noted on last Transthoracic Echocardiogram.   PA artery hypertension Noted on last Transthoracic Echocardiogram.  Hyperlipidemia -Continue Lipitor    DVT prophylaxis: SCDs Code Status:   Code Status: Full Code Family Communication: None at bedside Disposition Plan: Discharge  pending surgery management   Consultants:   Orthopedic surgery  Procedures:   None  Antimicrobials:  None    Subjective: No issues overnight. Could not get surgery yesterday because his INR was elevated.  Objective: Vitals:   03/31/19 2027 04/01/19 0434 04/01/19 0845 04/01/19 1000  BP: 111/60 110/61 126/61   Pulse: 83 83 88   Resp: 16 17    Temp: 97.7 F (36.5 C) 98.4 F (36.9 C) 98.6 F (37 C)   TempSrc: Oral Oral Oral   SpO2: 100% 97% 98%   Weight:    64.2 kg    Intake/Output Summary (Last 24 hours) at 04/01/2019 1213 Last data filed at 04/01/2019 0500 Gross per 24 hour  Intake 0 ml  Output 600 ml  Net -600 ml   Filed Weights   04/01/19 1000  Weight: 64.2 kg    Examination:  General exam: Appears calm and comfortable Respiratory system: Clear to auscultation. Respiratory effort normal. Cardiovascular system: S1 & S2 heard, RRR. No murmurs, rubs, gallops or clicks. Gastrointestinal system: Abdomen is nondistended, soft and nontender. No organomegaly or masses felt. Normal bowel sounds heard. Central nervous system: Alert and oriented. No focal neurological deficits. Extremities: No edema. No calf tenderness Skin: No cyanosis. No rashes Psychiatry: Judgement and insight appear normal. Mood & affect appropriate.     Data Reviewed: I have personally reviewed following labs and imaging studies  CBC: Recent Labs  Lab 03/30/19 0205 03/31/19 0453  WBC 13.6* 14.9*  NEUTROABS 11.4* 11.6*  HGB 13.3 10.8*  HCT 42.1 33.8*  MCV 85.4 84.1  PLT 211 [id number]   Basic Metabolic Panel: Recent Labs  Lab 03/30/19 0205 03/31/19 0453  NA 140 139  K 4.3 4.2  CL 98 99  CO2  30 29  GLUCOSE 170* 136*  BUN 34* 34*  CREATININE 1.00 0.98  CALCIUM 9.4 8.7*  MG  --  2.4  PHOS  --  3.3   GFR: Estimated Creatinine Clearance: 43.7 mL/min (by C-G formula based on SCr of 0.98 mg/dL). Liver Function Tests: Recent Labs  Lab 03/30/19 0205 03/31/19 0453  AST 31  --    ALT 30  --   ALKPHOS 123  --   BILITOT 1.0  --   PROT 8.3*  --   ALBUMIN 3.7 2.7*   No results for input(s): LIPASE, AMYLASE in the last 168 hours. No results for input(s): AMMONIA in the last 168 hours. Coagulation Profile: Recent Labs  Lab 03/30/19 0541 03/30/19 1744 03/31/19 0453 03/31/19 2038 04/01/19 0209  INR 2.6* 2.7* 1.9* 1.5* 1.5*   Cardiac Enzymes: No results for input(s): CKTOTAL, CKMB, CKMBINDEX, TROPONINI in the last 168 hours. BNP (last 3 results) No results for input(s): PROBNP in the last 8760 hours. HbA1C: Recent Labs    03/30/19 0541  HGBA1C 6.6*   CBG: Recent Labs  Lab 03/31/19 1611 03/31/19 2152 04/01/19 0007 04/01/19 0502 04/01/19 0816  GLUCAP 100* 167* 160* 123* 137*   Lipid Profile: No results for input(s): CHOL, HDL, LDLCALC, TRIG, CHOLHDL, LDLDIRECT in the last 72 hours. Thyroid Function Tests: No results for input(s): TSH, T4TOTAL, FREET4, T3FREE, THYROIDAB in the last 72 hours. Anemia Panel: No results for input(s): VITAMINB12, FOLATE, FERRITIN, TIBC, IRON, RETICCTPCT in the last 72 hours. Sepsis Labs: No results for input(s): PROCALCITON, LATICACIDVEN in the last 168 hours.  Recent Results (from the past 240 hour(s))  SARS Coronavirus 2 [facility] order, Performed in [facility] hospital lab) Nasopharyngeal Nasopharyngeal Swab     Status: None   Collection Time: 03/30/19  2:50 AM   Specimen: Nasopharyngeal Swab  Result Value Ref Range Status   SARS Coronavirus 2 NEGATIVE NEGATIVE Final    Comment: (NOTE) If result is NEGATIVE SARS-CoV-2 target nucleic acids are NOT DETECTED. The SARS-CoV-2 RNA is generally detectable in upper and lower  respiratory specimens during the acute phase of infection. The lowest  concentration of SARS-CoV-2 viral copies this assay can detect is 250  copies / mL. A negative result does not preclude SARS-CoV-2 infection  and should not be used as the sole basis for treatment or other  patient management  decisions.  A negative result may occur with  improper specimen collection / handling, submission of specimen other  than nasopharyngeal swab, presence of viral mutation(s) within the  areas targeted by this assay, and inadequate number of viral copies  (<250 copies / mL). A negative result must be combined with clinical  observations, patient history, and epidemiological information. If result is POSITIVE SARS-CoV-2 target nucleic acids are DETECTED. The SARS-CoV-2 RNA is generally detectable in upper and lower  respiratory specimens dur ing the acute phase of infection.  Positive  results are indicative of active infection with SARS-CoV-2.  Clinical  correlation with patient history and other diagnostic information is  necessary to determine patient infection status.  Positive results do  not rule out bacterial infection or co-infection with other viruses. If result is PRESUMPTIVE POSTIVE SARS-CoV-2 nucleic acids MAY BE PRESENT.   A presumptive positive result was obtained on the submitted specimen  and confirmed on repeat testing.  While 2019 novel coronavirus  (SARS-CoV-2) nucleic acids may be present in the submitted sample  additional confirmatory testing may be necessary for epidemiological  and / or clinical  management purposes  to differentiate between  SARS-CoV-2 and other Sarbecovirus currently known to infect humans.  If clinically indicated additional testing with an alternate test  methodology [phone]) is advised. The SARS-CoV-2 RNA is generally  detectable in upper and lower respiratory sp ecimens during the acute  phase of infection. The expected result is Negative. Fact Sheet for Patients:  StrictlyIdeas.no Fact Sheet for Healthcare Providers: BankingDealers.co.za This test is not yet approved or cleared by the [place] FDA and has been authorized for detection and/or diagnosis of SARS-CoV-2 by FDA under an  Emergency Use Authorization (EUA).  This EUA will remain in effect (meaning this test can be used) for the duration of the COVID-19 declaration under Section 564(b)(1) of the Act, 21 U.S.C. section 360bbb-3(b)(1), unless the authorization is terminated or revoked sooner. Performed at [facility], [place] [address] [place], [place] [address]   Surgical PCR screen     Status: Abnormal   Collection Time: 03/30/19  4:55 PM   Specimen: Nasal Mucosa; Nasal Swab  Result Value Ref Range Status   MRSA, PCR NEGATIVE NEGATIVE Final   Staphylococcus aureus POSITIVE (A) NEGATIVE Final    Comment: (NOTE) The Xpert SA Assay (FDA approved for NASAL specimens in patients 74 years of age and older), is one component of a comprehensive surveillance program. It is not intended to diagnose infection nor to guide or monitor treatment. Performed at [facility], [place] [address], [place], [place] [address]          Radiology Studies: No results found.      Scheduled Meds: . atorvastatin  40 mg Oral q1800  . Chlorhexidine Gluconate Cloth  6 each Topical Daily  . feeding supplement (ENSURE ENLIVE)  237 mL Oral BID BM  . insulin aspart  0-8 Units Subcutaneous Q4H  . metoprolol succinate  75 mg Oral Daily  . mupirocin ointment  1 application Nasal BID   Continuous Infusions:    LOS: 2 days     Cordelia Poche, MD [facility] 04/01/2019, 12:13 PM  If 7PM-7AM, please contact night-coverage [email or web address]

## 2019-04-01 NOTE — Progress Notes (Signed)
Notified on call phys- last inr result 1.5- no new orders at this time

## 2019-04-01 NOTE — Anesthesia Procedure Notes (Signed)
Epidural Patient location during procedure: OR Start time: 04/01/2019 8:03 PM End time: 04/01/2019 8:11 PM  Staffing Anesthesiologist: Albertha Ghee, MD Performed: anesthesiologist   Preanesthetic Checklist Completed: patient identified, site marked, surgical consent, pre-op evaluation, timeout performed, IV checked, risks and benefits discussed and monitors and equipment checked  Epidural Patient position: right lateral decubitus Prep: DuraPrep Patient monitoring: heart rate, cardiac monitor, continuous pulse ox and blood pressure Approach: midline Location: L3-L4 Injection technique: LOR air  Needle:  Needle type: Tuohy  Needle gauge: 18 G Needle length: 9 cm Needle insertion depth: 5 cm Catheter type: closed end flexible Catheter at skin depth: 11 cm  Additional Notes After slowly loading epidural with Ropivicaine over 30 minutes, the patient continued to have discomfort with lower extremity manipulation.  Epidural not functioning properly.  Pt has no analgesic effect.

## 2019-04-01 NOTE — Anesthesia Procedure Notes (Signed)
Procedure Name: LMA Insertion Date/Time: 04/01/2019 8:37 PM Performed by: Jearld Pies, CRNA Pre-anesthesia Checklist: Patient identified, Emergency Drugs available, Suction available and Patient being monitored Patient Re-evaluated:Patient Re-evaluated prior to induction Oxygen Delivery Method: Circle System Utilized Preoxygenation: Pre-oxygenation with 100% oxygen Induction Type: IV induction Ventilation: Mask ventilation without difficulty LMA: LMA inserted LMA Size: 4.0 Number of attempts: 1 Airway Equipment and Method: Bite block Placement Confirmation: positive ETCO2 Tube secured with: Tape Dental Injury: Teeth and Oropharynx as per pre-operative assessment

## 2019-04-01 NOTE — Transfer of Care (Signed)
Immediate Anesthesia Transfer of Care Note  Patient: Aaron Frye  Procedure(s) Performed: INTRAMEDULLARY (IM) NAIL FEMORAL (Left Hip)  Patient Location: PACU  Anesthesia Type:General and Epidural  Level of Consciousness: awake, alert  and oriented  Airway & Oxygen Therapy: Patient Spontanous Breathing and Patient connected to face mask oxygen  Post-op Assessment: Report given to RN and Post -op Vital signs reviewed and stable  Post vital signs: Reviewed and stable  Last Vitals:  Vitals Value Taken Time  BP 128/71 04/01/19 2154  Temp    Pulse 74 04/01/19 2159  Resp 11 04/01/19 2159  SpO2 100 % 04/01/19 2159  Vitals shown include unvalidated device data.  Last Pain:  Vitals:   04/01/19 1400  TempSrc: Oral  PainSc: 0-No pain      Patients Stated Pain Goal: 2 ([id number])  Complications: No apparent anesthesia complications

## 2019-04-01 NOTE — Progress Notes (Signed)
SLP Cancellation Note  Patient Details Name: RONALDINHO KINSTLE MRN: [id number] DOB: 1925-05-19   Cancelled treatment:       Reason Eval/Treat Not Completed: Medical issues which prohibited therapy. Potential surgery today. Pt NPO. Will f/u after surgery   Makeyla Govan, Katherene Ponto 04/01/2019, 9:55 AM

## 2019-04-01 NOTE — Anesthesia Procedure Notes (Signed)
Arterial Line Insertion Start/End9/30/2020 7:40 PM, 04/01/2019 7:50 PM Performed by: Jearld Pies, CRNA, CRNA  Patient location: Pre-op. Preanesthetic checklist: patient identified, IV checked, site marked, risks and benefits discussed, surgical consent, monitors and equipment checked, pre-op evaluation, timeout performed and anesthesia consent Lidocaine 1% used for infiltration Right, radial was placed Catheter size: 20 G Hand hygiene performed , maximum sterile barriers used  and Seldinger technique used Allen's test indicative of satisfactory collateral circulation Attempts: 1 Procedure performed without using ultrasound guided technique. Following insertion, dressing applied and Biopatch. Post procedure assessment: normal  Patient tolerated the procedure well with no immediate complications.

## 2019-04-02 ENCOUNTER — Encounter (HOSPITAL_COMMUNITY): Payer: Self-pay | Admitting: Orthopedic Surgery

## 2019-04-02 ENCOUNTER — Inpatient Hospital Stay (HOSPITAL_COMMUNITY): Payer: Medicare Other

## 2019-04-02 LAB — CBC
HCT: 33.8 % — ABNORMAL LOW (ref 39.0–52.0)
Hemoglobin: 10.5 g/dL — ABNORMAL LOW (ref 13.0–17.0)
MCH: 26.5 pg (ref 26.0–34.0)
MCHC: 31.1 g/dL (ref 30.0–36.0)
MCV: 85.4 fL (ref 80.0–100.0)
Platelets: 149 10*3/uL — ABNORMAL LOW (ref 150–400)
RBC: 3.96 MIL/uL — ABNORMAL LOW (ref 4.22–5.81)
RDW: 16.6 % — ABNORMAL HIGH (ref 11.5–15.5)
WBC: 14.5 10*3/uL — ABNORMAL HIGH (ref 4.0–10.5)
nRBC: 0 % (ref 0.0–0.2)

## 2019-04-02 LAB — BASIC METABOLIC PANEL
Anion gap: 11 (ref 5–15)
BUN: 45 mg/dL — ABNORMAL HIGH (ref 8–23)
CO2: 28 mmol/L (ref 22–32)
Calcium: 8.4 mg/dL — ABNORMAL LOW (ref 8.9–10.3)
Chloride: 99 mmol/L (ref 98–111)
Creatinine, Ser: 1.1 mg/dL (ref 0.61–1.24)
GFR calc Af Amer: >60 mL/min (ref >60)
GFR calc non Af Amer: 58 mL/min — ABNORMAL LOW (ref >60)
Glucose, Bld: 217 mg/dL — ABNORMAL HIGH (ref 70–99)
Potassium: 5.1 mmol/L (ref 3.5–5.1)
Sodium: 138 mmol/L (ref 135–145)

## 2019-04-02 LAB — GLUCOSE, CAPILLARY
Glucose-Capillary: 133 mg/dL — ABNORMAL HIGH (ref 70–99)
Glucose-Capillary: 157 mg/dL — ABNORMAL HIGH (ref 70–99)
Glucose-Capillary: 162 mg/dL — ABNORMAL HIGH (ref 70–99)
Glucose-Capillary: 189 mg/dL — ABNORMAL HIGH (ref 70–99)
Glucose-Capillary: 228 mg/dL — ABNORMAL HIGH (ref 70–99)
Glucose-Capillary: 263 mg/dL — ABNORMAL HIGH (ref 70–99)

## 2019-04-02 LAB — PROTIME-INR
INR: 1.3 — ABNORMAL HIGH (ref 0.8–1.2)
Prothrombin Time: 16.2 seconds — ABNORMAL HIGH (ref 11.4–15.2)

## 2019-04-02 MED ORDER — WARFARIN SODIUM 6 MG PO TABS
6.0000 mg | ORAL_TABLET | Freq: Once | ORAL | Status: AC
  Administered 2019-04-02: 6 mg via ORAL
  Filled 2019-04-02: qty 1

## 2019-04-02 MED ORDER — CHLORHEXIDINE GLUCONATE CLOTH 2 % EX PADS
6.0000 | MEDICATED_PAD | Freq: Every day | CUTANEOUS | Status: DC
  Administered 2019-04-02 – 2019-04-04 (×3): 6 via TOPICAL

## 2019-04-02 MED ORDER — RESOURCE THICKENUP CLEAR PO POWD
ORAL | Status: DC | PRN
  Filled 2019-04-02: qty 125

## 2019-04-02 NOTE — Progress Notes (Signed)
    Subjective:  Patient reports pain as mild to moderate.  Denies N/V/CP/SOB. No c/o.  Objective:   VITALS:   Vitals:   04/01/19 2322 04/02/19 0447 04/02/19 0810 04/02/19 1547  BP: 104/63 95/65 90/60  104/60  Pulse: 75 72 76 67  Resp: 13 15 14 16   Temp: 97.8 F (36.6 C) 98.4 F (36.9 C) 98.2 F (36.8 C) 97.9 F (36.6 C)  TempSrc:   Oral Oral  SpO2: 92% 92% 92% 95%  Weight:      Height:        NAD ABD soft Sensation intact distally Intact pulses distally Dorsiflexion/Plantar flexion intact Incision: dressing C/D/I Compartment soft   Lab Results  Component Value Date   WBC 14.5 (H) 04/02/2019   HGB 10.5 (L) 04/02/2019   HCT 33.8 (L) 04/02/2019   MCV 85.4 04/02/2019   PLT 149 (L) 04/02/2019   BMET    Component Value Date/Time   NA 138 04/02/2019 0859   NA 144 11/19/2017 1422   K 5.1 04/02/2019 0859   CL 99 04/02/2019 0859   CO2 28 04/02/2019 0859   GLUCOSE 217 (H) 04/02/2019 0859   BUN 45 (H) 04/02/2019 0859   BUN 25 11/19/2017 1422   CREATININE 1.10 04/02/2019 0859   CALCIUM 8.4 (L) 04/02/2019 0859   GFRNONAA 58 (L) 04/02/2019 0859   GFRAA >60 04/02/2019 0859   INR 1.3  Assessment/Plan: 1 Day Post-Op   Principal Problem:   Closed left hip fracture, initial encounter [facility]) Active Problems:   Essential hypertension   Atrial fibrillation, chronic (HCC)   History of stroke   Diastolic CHF, acute on chronic (HCC)   Hyperglycemia   COPD (chronic obstructive pulmonary disease) (HCC)   Chronic respiratory failure with hypoxia (HCC)   WBAT with walker DVT ppx: coumadin with Lovenox bridge, SCDs, TEDS PO pain control PT/OT Dispo:d/c foley, D/C planning, probably can d/c home with HHPT, d/c coumadin when INR > 1.9   Hilton Cork Alika Eppes 04/02/2019, 7:51 PM   Rod Can, MD Cell: [phone] [place] [facility] now [facility]  [place] [address], [address], [place], [place] [address] Phone: [phone]  [email or web address] Facebook  [employer]

## 2019-04-02 NOTE — Progress Notes (Signed)
ANTICOAGULATION CONSULT NOTE -Follow up  Pharmacy Consult for Heparin Indication: Afib and CVA  Allergies  Allergen Reactions  . Penicillins Rash and Other (See Comments)    Has patient had a PCN reaction causing immediate rash, facial/tongue/throat swelling, SOB or lightheadedness with hypotension: Yes Has patient had a PCN reaction causing severe rash involving mucus membranes or skin necrosis: Unk Has patient had a PCN reaction that required hospitalization: No Has patient had a PCN reaction occurring within the last 10 years: No If all of the above answers are "NO", then may proceed with Cephalosporin use.     Patient Measurements: Height: 5\' 7"  (170.2 cm) Weight: 141 lb 8.6 oz (64.2 kg) IBW/kg (Calculated) : 66.1 Heparin Dosing Weight: 63.6 kg (need updated weight)  Vital Signs: Temp: 98.2 F (36.8 C) (10/01 0810) Temp Source: Oral (10/01 0810) BP: 90/60 (10/01 0810) Pulse Rate: 76 (10/01 0810)  Labs: Recent Labs    03/31/19 0453  04/01/19 0209 04/01/19 1813 04/02/19 0859  HGB 10.8*  --   --   --  10.5*  HCT 33.8*  --   --   --  33.8*  PLT 157  --   --   --  149*  LABPROT 21.9*   < > 17.9* 16.9* 16.2*  INR 1.9*   < > 1.5* 1.4* 1.3*  CREATININE 0.98  --   --   --  1.10   < > = values in this interval not displayed.    Estimated Creatinine Clearance: 38.9 mL/min (by C-G formula based on SCr of 1.1 mg/dL).   Medical History: Past Medical History:  Diagnosis Date  . Atrial fibrillation ([place])    Persisent   . CVA (cerebral vascular accident) ([place])   . Femur fracture, right [facility]) March 2016  . HLD (hyperlipidemia)   . HTN (hypertension)   . Mitral stenosis   . Non-ischemic cardiomyopathy ([place])    LVEF=35-40% by echo 2009  . Prostate cancer ([place])   . TIA (transient ischemic attack)     Assessment:  CC/HPI: 85 y.o male admitted 03/30/19 with left hip fracture s/p fall at home  PMH: Afib, CVA, HTN, chronic diastolic CHF, COPD and restrictive lung disease  with chronic hypoxic respiratory failure, pulmonary hypertension, and severe mitral stenosis  Anticoag: h/o afib and CVA, on warfarin PTA. INR down to 1.5. Hgb (baseline) 13.3>>10.8 today? Hasn't even had surgery yet! Last weight 140lbs several years ago (63.6kg) 9/28: Vit K 5mg  po 9/29: Vit K 2mg  IV  9/30 PM Update: Post op, left IM femoral nail. Pharmacist discussed with Ortho MD and K Schorr with [facility]. Ortho MD wishes to restart warfarin tonight 9/30 but does not wish to start heparin drip and recommends enoxaparin prophylactic dose starting tomorrow AM ~1000 instead  Today 10/1: POD #1 s/p hip surgery left IM femoral nail.  Enoxaparin 40mg  sq daily started this AM INR 1.3 s/p multiple vitamin K doses. Warfarin resumed,  warfarin dose held  9/30 post op due to patient still had epidural at the time.  Epidural catheter removed 9/30 at 21:46.  Today the [facility] notes heart rate controlled currently, continue warfarin.  Hgb 13.3>10.8 prior to surgery > POD#1 hgb is10.5 low/stable, pltc211 on admit>157>149k. No bleeding reported.   PTA warfarin dose, 5 mg daily  LD taken 9/27 1800   Goal of Therapy:  Monitor platelets by anticoagulation protocol: Yes   Plan:  Warfarin 6 mg tonight Enoxaparin 40 mg sq daily started this AM Daily INR  Nicole Cella, RPh Clinical Pharmacist [phone] Please utilize Amion for appropriate phone number to reach the unit pharmacist ([place]) 04/02/2019,12:55 PM

## 2019-04-02 NOTE — Progress Notes (Signed)
  Speech Language Pathology Treatment: Dysphagia  Patient Details Name: Aaron Frye MRN: [id number] DOB: [birth year] Today's Date: 04/02/2019 Time: 0910-0930 SLP Time Calculation (min) (ACUTE ONLY): 20 min  Assessment / Plan / Recommendation Clinical Impression  Pt was encountered awake/alert and he was agreeable to ST tx session.  Pt was observed with trials of regular solids and nectar-thick liquid from his breakfast meal tray in addition to thin liquid.  Pt was observed to be mildly impulsive during po intake, taking large bites/sips.  This was reduced with verbal cuing.  Pt exhibited prolonged mastication of regular solids and he presented with min-mild oral residue.  No overt s/sx of aspiration were observed with nectar-thick liquid via straw sip or with solids.  Pt exhibited an immediate cough and delayed throat clear following thin liquid trials.  Recommend a MBS to further evaluate swallow function and to help determine the safest/least restrictive diet.  Additionally recommend Dysphagia 3 (mech soft) solids and nectar-thick liquids until MBS can be completed.     HPI HPI: 85 y.o. male with medical history significant for atrial fibrillation on warfarin, history of CVA, hypertension, chronic diastolic CHF, COPD and restrictive lung disease with chronic hypoxic respiratory failure, pulmonary hypertension, and severe mitral stenosis, now presenting to the emergency department with severe left hip pain after a fall at home.  Patient had been in his usual state of health and was having an uneventful day when he went to sit in a chair, missed the chair, landed on the floor, did not hit his head or lose consciousness, but began to experience severe pain at the left hip. x-rays showed a left intertroch fx and orthopedic surgery was consulted.     SLP Plan  MBS       Recommendations  Diet recommendations: Dysphagia 3 (mechanical soft);Nectar-thick liquid Liquids provided via:  Cup;Straw Medication Administration: Whole meds with puree Supervision: Intermittent supervision to cue for compensatory strategies Compensations: Slow rate;Small sips/bites Postural Changes and/or Swallow Maneuvers: Seated upright 90 degrees                Oral Care Recommendations: Oral care BID Follow up Recommendations: 24 hour supervision/assistance;Skilled Nursing facility SLP Visit Diagnosis: Dysphagia, unspecified (R13.10) Plan: MBS       Bretta Bang, M.S., [place] Office: [phone]               [place] 04/02/2019, 9:50 AM

## 2019-04-02 NOTE — Progress Notes (Signed)
PROGRESS NOTE    Aaron Frye  [id number] DOB: 03-12-1925 DOA: 03/30/2019 PCP: Nickola Major, MD   Brief Narrative: Aaron Frye is a 85 y.o. male with medical history significant for atrial fibrillation on warfarin, history of CVA, hypertension, chronic diastolic CHF, COPD and restrictive lung disease with chronic hypoxic respiratory failure, pulmonary hypertension, and severe mitral stenosis. Patient presented after a fall and found to have a left hip fracture.   Assessment & Plan:   Principal Problem:   Closed left hip fracture, initial encounter [facility]) Active Problems:   Essential hypertension   Atrial fibrillation, chronic (HCC)   History of stroke   Diastolic CHF, acute on chronic (HCC)   Hyperglycemia   COPD (chronic obstructive pulmonary disease) (HCC)   Chronic respiratory failure with hypoxia (HCC)   Closed left hip fracture Patient is normally ambulatory and active with his ADLs. He also takes care of his wife, who has dementia. S/p surgery on 9/30 -SLP recommendations: Dysphagia 3 diet -Orthopedic surgery recommendations -Consult CM for home health; no consideration for SNF on discharge per discussion with daughter/patient  Chronic atrial fibrillation Patient is on metoprolol and Coumadin as an outpatient. Rate controlled currently -Continue metoprolol -Resume Coumadin  Essential hypertension Stable -Continue metoprolol  Chronic diastolic heart failure Last EF of 65-70% in 2019.  Chronic respiratory failure with hypoxia Stable. -Continue O2 supplementation  Mitral valve stenosis Aortic valve stneosis Noted on last Transthoracic Echocardiogram.   PA artery hypertension Noted on last Transthoracic Echocardiogram.  Hyperlipidemia -Continue Lipitor    DVT prophylaxis: SCDs Code Status:   Code Status: Full Code Family Communication: None at bedside Disposition Plan: Discharge pending surgery management   Consultants:   Orthopedic  surgery  Procedures:   9/30: Left IM femoral nail  Antimicrobials:  None    Subjective: Pain is controlled.  Objective: Vitals:   04/01/19 2245 04/01/19 2322 04/02/19 0447 04/02/19 0810  BP:  104/63 95/65 90/60   Pulse:  75 72 76  Resp:  13 15 14   Temp: (!) 97 F (36.1 C) 97.8 F (36.6 C) 98.4 F (36.9 C) 98.2 F (36.8 C)  TempSrc:    Oral  SpO2:  92% 92% 92%  Weight:      Height:        Intake/Output Summary (Last 24 hours) at 04/02/2019 1157 Last data filed at 04/02/2019 0900 Gross per 24 hour  Intake 790 ml  Output 810 ml  Net -20 ml   Filed Weights   04/01/19 1000  Weight: 64.2 kg    Examination:  General exam: Appears calm and comfortable Respiratory system: Clear to auscultation. Respiratory effort normal. Cardiovascular system: S1 & S2 heard, RRR. No murmurs, rubs, gallops or clicks. Gastrointestinal system: Abdomen is nondistended, soft and nontender. No organomegaly or masses felt. Normal bowel sounds heard. Central nervous system: Alert and oriented. No focal neurological deficits. Extremities: No edema. No calf tenderness Skin: No cyanosis. No rashes Psychiatry: Judgement and insight appear normal. Mood & affect appropriate.     Data Reviewed: I have personally reviewed following labs and imaging studies  CBC: Recent Labs  Lab 03/30/19 0205 03/31/19 0453 04/02/19 0859  WBC 13.6* 14.9* 14.5*  NEUTROABS 11.4* 11.6*  --   HGB 13.3 10.8* 10.5*  HCT 42.1 33.8* 33.8*  MCV 85.4 84.1 85.4  PLT 211 [id number]*   Basic Metabolic Panel: Recent Labs  Lab 03/30/19 0205 03/31/19 0453 04/02/19 0859  NA 140 139 138  K 4.3 4.2 5.1  CL 98 99 99  CO2 30 29 28   GLUCOSE 170* 136* 217*  BUN 34* 34* 45*  CREATININE 1.00 0.98 1.10  CALCIUM 9.4 8.7* 8.4*  MG  --  2.4  --   PHOS  --  3.3  --    GFR: Estimated Creatinine Clearance: 38.9 mL/min (by C-G formula based on SCr of 1.1 mg/dL). Liver Function Tests: Recent Labs  Lab 03/30/19 0205  03/31/19 0453  AST 31  --   ALT 30  --   ALKPHOS 123  --   BILITOT 1.0  --   PROT 8.3*  --   ALBUMIN 3.7 2.7*   No results for input(s): LIPASE, AMYLASE in the last 168 hours. No results for input(s): AMMONIA in the last 168 hours. Coagulation Profile: Recent Labs  Lab 03/31/19 0453 03/31/19 2038 04/01/19 0209 04/01/19 1813 04/02/19 0859  INR 1.9* 1.5* 1.5* 1.4* 1.3*   Cardiac Enzymes: No results for input(s): CKTOTAL, CKMB, CKMBINDEX, TROPONINI in the last 168 hours. BNP (last 3 results) No results for input(s): PROBNP in the last 8760 hours. HbA1C: No results for input(s): HGBA1C in the last 72 hours. CBG: Recent Labs  Lab 04/01/19 1243 04/01/19 1628 04/02/19 0017 04/02/19 0442 04/02/19 0833  GLUCAP 135* 115* 133* 162* 228*   Lipid Profile: No results for input(s): CHOL, HDL, LDLCALC, TRIG, CHOLHDL, LDLDIRECT in the last 72 hours. Thyroid Function Tests: No results for input(s): TSH, T4TOTAL, FREET4, T3FREE, THYROIDAB in the last 72 hours. Anemia Panel: No results for input(s): VITAMINB12, FOLATE, FERRITIN, TIBC, IRON, RETICCTPCT in the last 72 hours. Sepsis Labs: No results for input(s): PROCALCITON, LATICACIDVEN in the last 168 hours.  Recent Results (from the past 240 hour(s))  SARS Coronavirus 2 [facility] order, Performed in [facility] hospital lab) Nasopharyngeal Nasopharyngeal Swab     Status: None   Collection Time: 03/30/19  2:50 AM   Specimen: Nasopharyngeal Swab  Result Value Ref Range Status   SARS Coronavirus 2 NEGATIVE NEGATIVE Final    Comment: (NOTE) If result is NEGATIVE SARS-CoV-2 target nucleic acids are NOT DETECTED. The SARS-CoV-2 RNA is generally detectable in upper and lower  respiratory specimens during the acute phase of infection. The lowest  concentration of SARS-CoV-2 viral copies this assay can detect is 250  copies / mL. A negative result does not preclude SARS-CoV-2 infection  and should not be used as the sole basis for  treatment or other  patient management decisions.  A negative result may occur with  improper specimen collection / handling, submission of specimen other  than nasopharyngeal swab, presence of viral mutation(s) within the  areas targeted by this assay, and inadequate number of viral copies  (<250 copies / mL). A negative result must be combined with clinical  observations, patient history, and epidemiological information. If result is POSITIVE SARS-CoV-2 target nucleic acids are DETECTED. The SARS-CoV-2 RNA is generally detectable in upper and lower  respiratory specimens dur ing the acute phase of infection.  Positive  results are indicative of active infection with SARS-CoV-2.  Clinical  correlation with patient history and other diagnostic information is  necessary to determine patient infection status.  Positive results do  not rule out bacterial infection or co-infection with other viruses. If result is PRESUMPTIVE POSTIVE SARS-CoV-2 nucleic acids MAY BE PRESENT.   A presumptive positive result was obtained on the submitted specimen  and confirmed on repeat testing.  While 2019 novel coronavirus  (SARS-CoV-2) nucleic acids may be present in the submitted  sample  additional confirmatory testing may be necessary for epidemiological  and / or clinical management purposes  to differentiate between  SARS-CoV-2 and other Sarbecovirus currently known to infect humans.  If clinically indicated additional testing with an alternate test  methodology [phone]) is advised. The SARS-CoV-2 RNA is generally  detectable in upper and lower respiratory sp ecimens during the acute  phase of infection. The expected result is Negative. Fact Sheet for Patients:  StrictlyIdeas.no Fact Sheet for Healthcare Providers: BankingDealers.co.za This test is not yet approved or cleared by the [place] FDA and has been authorized for detection and/or  diagnosis of SARS-CoV-2 by FDA under an Emergency Use Authorization (EUA).  This EUA will remain in effect (meaning this test can be used) for the duration of the COVID-19 declaration under Section 564(b)(1) of the Act, 21 U.S.C. section 360bbb-3(b)(1), unless the authorization is terminated or revoked sooner. Performed at [facility], [place] [address], [place], [place] [address]   Surgical PCR screen     Status: Abnormal   Collection Time: 03/30/19  4:55 PM   Specimen: Nasal Mucosa; Nasal Swab  Result Value Ref Range Status   MRSA, PCR NEGATIVE NEGATIVE Final   Staphylococcus aureus POSITIVE (A) NEGATIVE Final    Comment: (NOTE) The Xpert SA Assay (FDA approved for NASAL specimens in patients 43 years of age and older), is one component of a comprehensive surveillance program. It is not intended to diagnose infection nor to guide or monitor treatment. Performed at [facility], [place] [address] [place], [place] [address]          Radiology Studies: Pelvis Portable  Result Date: 04/01/2019 CLINICAL DATA:  ORIF left hip fracture. EXAM: PORTABLE PELVIS 1-2 VIEWS COMPARISON:  Preoperative imaging. FINDINGS: Medullary rod with distal locking and trans trochanteric screw fixation of intertrochanteric femur fracture. Fracture is in improved alignment compared to preoperative imaging. No new periprosthetic lucency or fracture. Recent postsurgical change includes air and edema in the soft tissues and joint. Surgical hardware in the right femur partially included. Bones are under mineralized. There are vascular calcifications. IMPRESSION: ORIF of left intertrochanteric femur fracture. No immediate postoperative complication. Electronically Signed   By: Keith Rake M.D.   On: 04/01/2019 22:46   Dg C-arm 1-60 Min  Result Date: 04/01/2019 CLINICAL DATA:  Known left intratrochanteric fracture EXAM: LEFT FEMUR 2 VIEWS; DG C-ARM 1-60 MIN COMPARISON:  03/30/2019 FLUOROSCOPY TIME:   Fluoroscopy Time:  41 seconds Radiation Exposure Index (if provided by the fluoroscopic device): Not available Number of Acquired Spot Images: 3 FINDINGS: Proximal medullary rod with fixation screw traversing the femoral neck is seen. The fracture fragments are in near anatomic alignment. IMPRESSION: Status post ORIF of left femoral fracture. Electronically Signed   By: Inez Catalina M.D.   On: 04/01/2019 21:58   Dg Femur Min 2 Views Left  Result Date: 04/01/2019 CLINICAL DATA:  Known left intratrochanteric fracture EXAM: LEFT FEMUR 2 VIEWS; DG C-ARM 1-60 MIN COMPARISON:  03/30/2019 FLUOROSCOPY TIME:  Fluoroscopy Time:  41 seconds Radiation Exposure Index (if provided by the fluoroscopic device): Not available Number of Acquired Spot Images: 3 FINDINGS: Proximal medullary rod with fixation screw traversing the femoral neck is seen. The fracture fragments are in near anatomic alignment. IMPRESSION: Status post ORIF of left femoral fracture. Electronically Signed   By: Inez Catalina M.D.   On: 04/01/2019 21:58        Scheduled Meds: . atorvastatin  40 mg Oral q1800  .  Chlorhexidine Gluconate Cloth  6 each Topical Daily  . docusate sodium  100 mg Oral BID  . enoxaparin (LOVENOX) injection  40 mg Subcutaneous Q24H  . feeding supplement (ENSURE ENLIVE)  237 mL Oral BID BM  . insulin aspart  0-8 Units Subcutaneous Q4H  . metoprolol succinate  75 mg Oral Daily  . mupirocin ointment  1 application Nasal BID  . Warfarin - Pharmacist Dosing Inpatient   Does not apply q1800   Continuous Infusions:    LOS: 3 days     Cordelia Poche, MD [facility] 04/02/2019, 11:57 AM  If 7PM-7AM, please contact night-coverage [email or web address]

## 2019-04-02 NOTE — Progress Notes (Addendum)
Pt  s/p  IM nail L hip. Slow to wake up per PACU Nurse [place]. Pt  can be aroused, drowsy. Continuous pox maintained. O2 3L decreased to 2L, pox sat at 96% . IVF's infusing without difficulty. No narc meds given. Will cont freq rounds per MD orders and unit protocol.  Noted order to remove Coude cath POD 1. Noted coude was placed 04/01/2019 @2028 . Amion texted covering MD to advise concerning coude cath removal. Awaiting response.

## 2019-04-02 NOTE — Plan of Care (Signed)
  Problem: Clinical Measurements: Goal: Postoperative complications will be avoided or minimized Outcome: Progressing   Problem: Skin Integrity: Goal: Will show signs of wound healing Outcome: Progressing

## 2019-04-02 NOTE — Progress Notes (Addendum)
Modified Barium Swallow Progress Note  Patient Details  Name: [name] MRN: [id number] Date of Birth: [birth year]  Today's Date: 04/02/2019  Modified Barium Swallow completed.  Full report located under Chart Review in the Imaging Section.  Brief recommendations include the following:  Clinical Impression  Aaron Frye is a 85 year old male who presents with moderately severe oropharyngeal dysphagia with resultant silent aspiration of thin liquid and nectar-thick liquid on today's examination.  Pt additionally exhibited laryngeal penetration of honey-thick liquid via straw sip.  Pt's dysphagia is secondary to generalized oropharyngeal weakness and reduced laryngeal closure.  Chin tuck maneuver was unsuccessful in eliminating aspiration events.  Pt exhibited moderate pharyngeal residue throughout this evaluation which he was unable to clear with cued cough and that he intermittently penetrated during all trials.  Oral phase was remarkable for reduced lingual strength resulting in oral residue with all consistencies.  Pharyngeal phase was remarkable for reduced hyolaryngeal elevation/excursion resulting in vallecular and pyriform residue, reduced BOT retraction resulting in vallecular residue, and reduced laryngeal closure resulting in aspiration before the swallow.  Pt with reduced laryngeal sensation evidenced by no attempt to clear aspirated material with a spontaneous cough.  Pharyngoesophageal phase was remarkable for a suspected CP bar at the level of C4.  Recommend Dysphagia 2 (ground) solids and honey-thick liquids (no straws); however, pt is at an increased risk for aspiration with all consistencies secondary to reduced laryngeal closure and pharyngeal residue.  Due to full code status and moderate-severe aspiration risk, pt is not appropriate for the [employer] Protocol at this time.   Swallow Evaluation Recommendations       SLP Diet Recommendations: Dysphagia 2 (Fine  chop) solids;Honey thick liquids   Liquid Administration via: Cup   Medication Administration: Crushed with puree   Supervision: Full supervision/cueing for compensatory strategies   Compensations: Slow rate;Small sips/bites   Postural Changes: Remain semi-upright after after feeds/meals (Comment);Seated upright at 90 degrees   Oral Care Recommendations: Oral care BID   Other Recommendations: Order thickener from [place], M.S., [place] Office: [phone]   Elvia Collum Sharday Michl 04/02/2019,12:24 PM

## 2019-04-02 NOTE — TOC Initial Note (Signed)
Transition of Care [facility]) - Initial/Assessment Note    Patient Details  Name: Aaron Frye MRN: [id number] Date of Birth: 01/26/1925  Transition of Care [facility] [place]) CM/SW Contact:    Midge Minium RN, BSN, NCM-BC, ACM-RN [phone] Phone Number: 04/02/2019, 3:47 PM  Clinical Narrative:                 CM following for dispositional needs. CM spoke to patients daughter Rica Mast, to discuss the POC. Patient lives at home with his spouse (early dementia) and was independent with his care using assistive devices. Patient is s/p L femur IMN 9/30. PT eval completed with OT eval (pending). Patient/daughter is declining ST SNF, but agreeable to [facility] with DME; daughter will arranged 24/7 assistance and stay with patient until those services are arranged prior to returning to [place]. CMS HH Compare and DME preference provided with [facility] selected. DME referral given to Learta Codding [facility] liaison); [place] referral given to Joen Laura RN [facility] liaison); AVS updated. CM emailed patients daughter "[place] resources", as requested. CM team will continue to follow.   Expected Discharge Plan: [place] Barriers to Discharge: Continued Medical Work up   Patient Goals and CMS Choice Patient states their goals for this hospitalization and ongoing recovery are:: "to return home" CMS Medicare.gov Compare Post Acute Care list provided to:: Other (Comment Required) Choice offered to / list presented to : Adult Children  Expected Discharge Plan and Services Expected Discharge Plan: [place] In-house Referral: NA Discharge Planning Services: CM Consult Post Acute Care Choice: Durable Medical Equipment, Home Health Living arrangements for the past 2 months: Single Family Home                 DME Arranged: Wheelchair manual DME Agency: [place] Date DME Agency Contacted: 04/02/19 Time DME Agency Contacted:  [id number] Representative spoke with at DME Agency: Learta Codding HH Arranged: RN, Disease Management, PT, OT, Nurse's Aide, Refused SNF, Social Work [employer] Agency: [facility] [employer] Date [place]: 04/02/19 Time [place]: [id number] Representative spoke with at [place]: [place]  Prior Living Arrangements/Services Living arrangements for the past 2 months: [place] with:: Self, Spouse Patient language and need for interpreter reviewed:: Yes Do you feel safe going back to the place where you live?: Yes      Need for Family Participation in Patient Care: Yes (Comment) Care giver support system in place?: No (comment)(daughter is working to arrange 24/7 care) Current home services: DME Criminal Activity/Legal Involvement Pertinent to Current Situation/Hospitalization: No - Comment as needed  Activities of Daily Living Home Assistive Devices/Equipment: None ADL Screening (condition at time of admission) Patient's cognitive ability adequate to safely complete daily activities?: Yes Is the patient deaf or have difficulty hearing?: Yes Does the patient have difficulty seeing, even when wearing glasses/contacts?: No Does the patient have difficulty concentrating, remembering, or making decisions?: No Patient able to express need for assistance with ADLs?: Yes Does the patient have difficulty dressing or bathing?: No Independently performs ADLs?: Yes (appropriate for developmental age) Does the patient have difficulty walking or climbing stairs?: No Weakness of Legs: None Weakness of Arms/Hands: None  Permission Sought/Granted Permission sought to share information with : Case Manager, Customer service manager, Family Supports Permission granted to share information with : Yes, Verbal Permission Granted  Share Information with NAME: Rica Mast  Permission granted  to share info w AGENCY: HH/DME agencies  Permission  granted to share info w Relationship: daughter  Permission granted to share info w Contact Information: [phone]  Emotional Assessment       Orientation: : Oriented to Self, Oriented to Place, Oriented to  Time, Oriented to Situation Alcohol / Substance Use: Not Applicable Psych Involvement: No (comment)  Admission diagnosis:  Closed left hip fracture, initial encounter [facility]) [S72.002A] Patient Active Problem List   Diagnosis Date Noted  . Closed left hip fracture, initial encounter ([place]) 03/30/2019  . Chronic respiratory failure with hypoxia ([place])   . Elevated troponin 10/20/2017  . SOB (shortness of breath)   . Hyperglycemia   . COPD (chronic obstructive pulmonary disease) ([place])   . Diastolic CHF, acute on chronic (HCC)   . Mitral stenosis   . Leukocytosis 09/24/2014  . Intertrochanteric fracture of right femur ([place]) 09/24/2014  . Hip fracture ([place]) 09/23/2014  . History of stroke 09/23/2014  . HLD (hyperlipidemia) 09/23/2014  . Fall   . Encounter for therapeutic drug monitoring 09/01/2013  . Long term current use of anticoagulant therapy 11/14/2010  . Congestive heart failure ([place]) 04/18/2010  . EDEMA 10/12/2008  . Elevated lipids 10/08/2008  . Essential hypertension 10/08/2008  . Atrial fibrillation, chronic ([place]) 10/08/2008  . CVA 10/08/2008  . Transient cerebral ischemia 10/08/2008   PCP:  Nickola Major, MD Pharmacy:   [facility] [address], [place] - [address] [place] [place] [address] Phone: [phone] Fax: [phone]     Social Determinants of Health (SDOH) Interventions    Readmission Risk Interventions No flowsheet data found.

## 2019-04-02 NOTE — Evaluation (Signed)
Physical Therapy Evaluation Patient Details Name: Aaron Frye MRN: [id number] DOB: Dec 08, 1924 Today's Date: 04/02/2019   History of Present Illness  Pt is a 85 y.o. male admitted 03/30/19 after fall at home sustaining L intertrochanteric hip fx. S/p L femur IMN 9/30. PMH includes afib, CVA, R femur fx s/p IMN (08/2014), HTN, cardiomyopathy.    Clinical Impression  Pt presents with an overall decrease in functional mobility secondary to above. PTA, pt mod indep with RW, lives with wife (daughter reports who has early stages of dementia), and values his independence. Educ on precautions, positioning, and importance of mobility. Today, pt able to initiate transfer and gait training with RW and modA. Daughter present throughout session; discussed recommendation for SNF-level therapies. Daughter plans to have pt return home with 24/7 caregivers and [facility] services. Pt will benefit from wheelchair; daughter has access to other necessary DME. Will follow acutely to address established goals.    Follow Up Recommendations Home health PT;Supervision/Assistance - 24 hour(family declining rec for SNF)    Equipment Recommendations  Wheelchair (measurements PT);Wheelchair cushion (measurements PT)    Recommendations for Other Services       Precautions / Restrictions Precautions Precautions: Fall Restrictions Weight Bearing Restrictions: Yes LLE Weight Bearing: Weight bearing as tolerated      Mobility  Bed Mobility Overal bed mobility: Needs Assistance Bed Mobility: Supine to Sit     Supine to sit: Mod assist     General bed mobility comments: Cues for sequencing, pt able to assist with BLEs and rail support; modA for scooting hips to EOB and trunk elevation  Transfers Overall transfer level: Needs assistance Equipment used: Rolling walker (2 wheeled) Transfers: Sit to/from Stand Sit to Stand: Mod assist         General transfer comment: ModA to assist trunk elevation from EOB to  RW, cues for hand placement; posterior lean  Ambulation/Gait Ambulation/Gait assistance: Mod assist Gait Distance (Feet): 2 Feet Assistive device: Rolling walker (2 wheeled) Gait Pattern/deviations: Step-to pattern;Decreased weight shift to left;Antalgic;Leaning posteriorly Gait velocity: Decreased Gait velocity interpretation: <1.31 ft/sec, indicative of household ambulator General Gait Details: Slow, antalgic steps to recliner with RW and consistent min-modA to maintain balance due to posterior lean; cues for sequencing with steps and RW navigation  Stairs            Wheelchair Mobility    Modified Rankin (Stroke Patients Only)       Balance Overall balance assessment: Needs assistance   Sitting balance-Leahy Scale: Fair Sitting balance - Comments: Initial min-modA progressing to min guard-supervision     Standing balance-Leahy Scale: Poor Standing balance comment: Reliant on UE support and external assist                             Pertinent Vitals/Pain Pain Assessment: Faces Faces Pain Scale: Hurts little more Pain Location: LLE with mobility Pain Descriptors / Indicators: Guarding;Grimacing Pain Intervention(s): Monitored during session;Repositioned    Home Living Family/patient expects to be discharged to:: Private residence Living Arrangements: Spouse/significant other Available Help at Discharge: Family;Available PRN/intermittently Type of Home: House Home Access: Ramped entrance     Home Layout: One level Home Equipment: Walker - 2 wheels;Shower seat Additional Comments: Wife with early stages dementia, unable to provide assist. Daughter planning to arrange 24/7 assist by caregivers. Daughter to get [facility]    Prior Function Level of Independence: Independent with assistive device(s)  Comments: Ambulatory with RW; daughter verifies this is the first fall recently. Ascends steps to bedroom independently. Pt values his independence,  enjoys sitting and reading     Hand Dominance        Extremity/Trunk Assessment   Upper Extremity Assessment Upper Extremity Assessment: Generalized weakness    Lower Extremity Assessment Lower Extremity Assessment: Generalized weakness;LLE deficits/detail LLE Deficits / Details: s/p L hip IMN LLE: Unable to fully assess due to pain LLE Coordination: decreased gross motor;decreased fine motor    Cervical / Trunk Assessment Cervical / Trunk Assessment: Other exceptions Cervical / Trunk Exceptions: Baseline significant cervical flexion, rounded shoulders  Communication   Communication: HOH  Cognition Arousal/Alertness: Awake/alert Behavior During Therapy: Flat affect Overall Cognitive Status: Within Functional Limits for tasks assessed                                 General Comments: WFL for simple tasks; HOH      General Comments General comments (skin integrity, edema, etc.): Daughter present and supportive - daughter adamantly against SNF (sounds like pt's wife does not do well without pt home due to her h/o dementia), plans to have 24/7 caregivers to provide physical assist, wants w/c but can get other DME [facility])    Exercises     Assessment/Plan    PT Assessment Patient needs continued PT services  PT Problem List Decreased strength;Decreased range of motion;Decreased activity tolerance;Decreased balance;Decreased mobility;Decreased knowledge of use of DME;Decreased knowledge of precautions;Pain       PT Treatment Interventions DME instruction;Gait training;Functional mobility training;Therapeutic activities;Therapeutic exercise;Balance training;Patient/family education;Wheelchair mobility training    PT Goals (Current goals can be found in the Care Plan section)  Acute Rehab PT Goals Patient Stated Goal: Return home with 24/7 assist PT Goal Formulation: With patient/family Time For Goal Achievement: 04/16/19 Potential to Achieve Goals: Good     Frequency Min 5X/week   Barriers to discharge        Co-evaluation               AM-PAC PT "6 Clicks" Mobility  Outcome Measure Help needed turning from your back to your side while in a flat bed without using bedrails?: A Lot Help needed moving from lying on your back to sitting on the side of a flat bed without using bedrails?: A Lot Help needed moving to and from a bed to a chair (including a wheelchair)?: A Lot Help needed standing up from a chair using your arms (e.g., wheelchair or bedside chair)?: A Lot Help needed to walk in hospital room?: A Lot Help needed climbing 3-5 steps with a railing? : A Lot 6 Click Score: 12    End of Session Equipment Utilized During Treatment: Gait belt Activity Tolerance: Patient tolerated treatment well Patient left: in chair;with call bell/phone within reach;with chair alarm set;with family/visitor present Nurse Communication: Mobility status PT Visit Diagnosis: Other abnormalities of gait and mobility (R26.89);Pain;Muscle weakness (generalized) (M62.81) Pain - Right/Left: Left Pain - part of body: Leg    Time: 1110-1140 PT Time Calculation (min) (ACUTE ONLY): 30 min   Charges:   PT Evaluation $PT Eval Moderate Complexity: 1 Mod PT Treatments $Therapeutic Activity: 8-22 mins      Mabeline Caras, PT, DPT Acute Rehabilitation Services  Pager [phone] Office [place] 04/02/2019, 12:58 PM

## 2019-04-02 NOTE — Progress Notes (Signed)
Patient has a Coude Cath that was placed in OR last night. Did not remove today, left message for Elam Dutch PA.

## 2019-04-02 NOTE — Plan of Care (Signed)
  Problem: Activity: Goal: Ability to avoid complications of mobility impairment will improve Outcome: Progressing Goal: Ability to tolerate increased activity will improve Outcome: Progressing   Problem: Pain Management: Goal: Pain level will decrease with appropriate interventions Outcome: Progressing   

## 2019-04-03 LAB — CBC
HCT: 32.3 % — ABNORMAL LOW (ref 39.0–52.0)
Hemoglobin: 10.4 g/dL — ABNORMAL LOW (ref 13.0–17.0)
MCH: 26.8 pg (ref 26.0–34.0)
MCHC: 32.2 g/dL (ref 30.0–36.0)
MCV: 83.2 fL (ref 80.0–100.0)
Platelets: 162 10*3/uL (ref 150–400)
RBC: 3.88 MIL/uL — ABNORMAL LOW (ref 4.22–5.81)
RDW: 16.5 % — ABNORMAL HIGH (ref 11.5–15.5)
WBC: 20.1 10*3/uL — ABNORMAL HIGH (ref 4.0–10.5)
nRBC: 0 % (ref 0.0–0.2)

## 2019-04-03 LAB — BASIC METABOLIC PANEL
Anion gap: 13 (ref 5–15)
Anion gap: 12 (ref 5–15)
BUN: 71 mg/dL — ABNORMAL HIGH (ref 8–23)
BUN: 75 mg/dL — ABNORMAL HIGH (ref 8–23)
CO2: 28 mmol/L (ref 22–32)
CO2: 26 mmol/L (ref 22–32)
Calcium: 8.6 mg/dL — ABNORMAL LOW (ref 8.9–10.3)
Calcium: 8.5 mg/dL — ABNORMAL LOW (ref 8.9–10.3)
Chloride: 95 mmol/L — ABNORMAL LOW (ref 98–111)
Chloride: 99 mmol/L (ref 98–111)
Creatinine, Ser: 1.57 mg/dL — ABNORMAL HIGH (ref 0.61–1.24)
Creatinine, Ser: 1.43 mg/dL — ABNORMAL HIGH (ref 0.61–1.24)
GFR calc Af Amer: 44 mL/min — ABNORMAL LOW (ref >60)
GFR calc Af Amer: 49 mL/min — ABNORMAL LOW (ref >60)
GFR calc non Af Amer: 38 mL/min — ABNORMAL LOW (ref >60)
GFR calc non Af Amer: 42 mL/min — ABNORMAL LOW (ref >60)
Glucose, Bld: 224 mg/dL — ABNORMAL HIGH (ref 70–99)
Glucose, Bld: 147 mg/dL — ABNORMAL HIGH (ref 70–99)
Potassium: 4.9 mmol/L (ref 3.5–5.1)
Potassium: 4.4 mmol/L (ref 3.5–5.1)
Sodium: 136 mmol/L (ref 135–145)
Sodium: 137 mmol/L (ref 135–145)

## 2019-04-03 LAB — GLUCOSE, CAPILLARY
Glucose-Capillary: 123 mg/dL — ABNORMAL HIGH (ref 70–99)
Glucose-Capillary: 139 mg/dL — ABNORMAL HIGH (ref 70–99)
Glucose-Capillary: 186 mg/dL — ABNORMAL HIGH (ref 70–99)
Glucose-Capillary: 188 mg/dL — ABNORMAL HIGH (ref 70–99)
Glucose-Capillary: 222 mg/dL — ABNORMAL HIGH (ref 70–99)

## 2019-04-03 LAB — PROTIME-INR
INR: 1.5 — ABNORMAL HIGH (ref 0.8–1.2)
Prothrombin Time: 17.5 seconds — ABNORMAL HIGH (ref 11.4–15.2)

## 2019-04-03 MED ORDER — WARFARIN SODIUM 6 MG PO TABS
6.0000 mg | ORAL_TABLET | Freq: Once | ORAL | Status: AC
  Administered 2019-04-03: 6 mg via ORAL
  Filled 2019-04-03: qty 1

## 2019-04-03 MED ORDER — WARFARIN SODIUM 7.5 MG PO TABS: 7.5000 mg | ORAL_TABLET | Freq: Once | ORAL | Status: DC

## 2019-04-03 MED ORDER — PHENYLEPHRINE HCL-NACL 10-0.9 MG/250ML-% IV SOLN
INTRAVENOUS | Status: AC
  Filled 2019-04-03: qty 250

## 2019-04-03 MED ORDER — SODIUM CHLORIDE 0.9 % IV SOLN
INTRAVENOUS | Status: DC
  Administered 2019-04-03 – 2019-04-04 (×3): via INTRAVENOUS

## 2019-04-03 NOTE — Progress Notes (Signed)
PROGRESS NOTE    Aaron Frye  [id number] DOB: [birth year] DOA: 03/30/2019 PCP: Nickola Major, MD   Brief Narrative: Aaron Frye is a 85 y.o. male with medical history significant for atrial fibrillation on warfarin, history of CVA, hypertension, chronic diastolic CHF, COPD and restrictive lung disease with chronic hypoxic respiratory failure, pulmonary hypertension, and severe mitral stenosis. Patient presented after a fall and found to have a left hip fracture.   Assessment & Plan:   Principal Problem:   Closed left hip fracture, initial encounter [facility]) Active Problems:   Essential hypertension   Atrial fibrillation, chronic (HCC)   History of stroke   Diastolic CHF, acute on chronic (HCC)   Hyperglycemia   COPD (chronic obstructive pulmonary disease) (HCC)   Chronic respiratory failure with hypoxia (HCC)   Closed left hip fracture Patient is normally ambulatory and active with his ADLs. He also takes care of his wife, who has dementia. S/p surgery on 9/30 -SLP recommendations: Dysphagia 3 diet -Orthopedic surgery recommendations -Consult CM for home health; no consideration for SNF on discharge per discussion with daughter/patient  Chronic atrial fibrillation Patient is on metoprolol and Coumadin as an outpatient. Rate controlled currently -Continue metoprolol -Continue Coumadin  Essential hypertension Stable -Continue metoprolol  Acute kidney injury New after surgery. Baseline creatinine of 1. Creatinine of 1.57 on 10/2 with an associated BUN of 71 -Start IV fluids, NS @100  ml/hr; recheck BMP this afternoon.  Leukocytosis Probably stress reaction from surgery. No obvious evidence of infection at this time.  Chronic diastolic heart failure Last EF of 65-70% in 2019.  Chronic respiratory failure with hypoxia Stable. -Continue O2 supplementation  Mitral valve stenosis Aortic valve stneosis Noted on last Transthoracic Echocardiogram.   PA  artery hypertension Noted on last Transthoracic Echocardiogram.  Hyperlipidemia -Continue Lipitor    DVT prophylaxis: SCDs, Coumadin, Lovenox Code Status:   Code Status: Full Code Family Communication: None at bedside Disposition Plan: Discharge pending surgery management and improvement of AKI   Consultants:   Orthopedic surgery  Procedures:   9/30: Left IM femoral nail  Antimicrobials:  None    Subjective: Mild cough after taking his medication, otherwise no symptoms. Pain is controlled.  Objective: Vitals:   04/02/19 0810 04/02/19 1547 04/02/19 2012 04/03/19 0341  BP: 90/60 104/60 (!) 104/57 (!) 110/53  Pulse: 76 67 78 86  Resp: 14 16 14 18   Temp: 98.2 F (36.8 C) 97.9 F (36.6 C) 97.6 F (36.4 C) 97.8 F (36.6 C)  TempSrc: Oral Oral  Oral  SpO2: 92% 95% 91% 91%  Weight:      Height:        Intake/Output Summary (Last 24 hours) at 04/03/2019 0743 Last data filed at 04/03/2019 0341 Gross per 24 hour  Intake 720 ml  Output 275 ml  Net 445 ml   Filed Weights   04/01/19 1000  Weight: 64.2 kg    Examination:  General exam: Appears calm and comfortable Respiratory system: Clear to auscultation. Respiratory effort normal. Cardiovascular system: S1 & S2 heard. No murmurs, rubs, gallops or clicks. Gastrointestinal system: Abdomen is nondistended, soft and nontender. No organomegaly or masses felt. Normal bowel sounds heard. Central nervous system: Alert and oriented. No focal neurological deficits. Extremities: No edema. No calf tenderness Skin: No cyanosis. No rashes Psychiatry: Judgement and insight appear normal. Mood & affect appropriate.    Data Reviewed: I have personally reviewed following labs and imaging studies  CBC: Recent Labs  Lab 03/30/19 0205 03/31/19  [id number] 04/02/19 0859 04/03/19 0316  WBC 13.6* 14.9* 14.5* 20.1*  NEUTROABS 11.4* 11.6*  --   --   HGB 13.3 10.8* 10.5* 10.4*  HCT 42.1 33.8* 33.8* 32.3*  MCV 85.4 84.1 85.4 83.2   PLT 211 157 149* 0000000   Basic Metabolic Panel: Recent Labs  Lab 03/30/19 0205 03/31/19 0453 04/02/19 0859 04/03/19 0316  NA 140 139 138 136  K 4.3 4.2 5.1 4.9  CL 98 99 99 95*  CO2 30 29 28 28   GLUCOSE 170* 136* 217* 224*  BUN 34* 34* 45* 71*  CREATININE 1.00 0.98 1.10 1.57*  CALCIUM 9.4 8.7* 8.4* 8.6*  MG  --  2.4  --   --   PHOS  --  3.3  --   --    GFR: Estimated Creatinine Clearance: 27.3 mL/min (A) (by C-G formula based on SCr of 1.57 mg/dL (H)). Liver Function Tests: Recent Labs  Lab 03/30/19 0205 03/31/19 0453  AST 31  --   ALT 30  --   ALKPHOS 123  --   BILITOT 1.0  --   PROT 8.3*  --   ALBUMIN 3.7 2.7*   No results for input(s): LIPASE, AMYLASE in the last 168 hours. No results for input(s): AMMONIA in the last 168 hours. Coagulation Profile: Recent Labs  Lab 03/31/19 2038 04/01/19 0209 04/01/19 1813 04/02/19 0859 04/03/19 0316  INR 1.5* 1.5* 1.4* 1.3* 1.5*   Cardiac Enzymes: No results for input(s): CKTOTAL, CKMB, CKMBINDEX, TROPONINI in the last 168 hours. BNP (last 3 results) No results for input(s): PROBNP in the last 8760 hours. HbA1C: No results for input(s): HGBA1C in the last 72 hours. CBG: Recent Labs  Lab 04/02/19 1157 04/02/19 1639 04/02/19 2007 04/03/19 0003 04/03/19 0338  GLUCAP 263* 189* 157* 188* 222*   Lipid Profile: No results for input(s): CHOL, HDL, LDLCALC, TRIG, CHOLHDL, LDLDIRECT in the last 72 hours. Thyroid Function Tests: No results for input(s): TSH, T4TOTAL, FREET4, T3FREE, THYROIDAB in the last 72 hours. Anemia Panel: No results for input(s): VITAMINB12, FOLATE, FERRITIN, TIBC, IRON, RETICCTPCT in the last 72 hours. Sepsis Labs: No results for input(s): PROCALCITON, LATICACIDVEN in the last 168 hours.  Recent Results (from the past 240 hour(s))  SARS Coronavirus 2 [facility] order, Performed in [facility] hospital lab) Nasopharyngeal Nasopharyngeal Swab     Status: None   Collection Time: 03/30/19  2:50 AM    Specimen: Nasopharyngeal Swab  Result Value Ref Range Status   SARS Coronavirus 2 NEGATIVE NEGATIVE Final    Comment: (NOTE) If result is NEGATIVE SARS-CoV-2 target nucleic acids are NOT DETECTED. The SARS-CoV-2 RNA is generally detectable in upper and lower  respiratory specimens during the acute phase of infection. The lowest  concentration of SARS-CoV-2 viral copies this assay can detect is 250  copies / mL. A negative result does not preclude SARS-CoV-2 infection  and should not be used as the sole basis for treatment or other  patient management decisions.  A negative result may occur with  improper specimen collection / handling, submission of specimen other  than nasopharyngeal swab, presence of viral mutation(s) within the  areas targeted by this assay, and inadequate number of viral copies  (<250 copies / mL). A negative result must be combined with clinical  observations, patient history, and epidemiological information. If result is POSITIVE SARS-CoV-2 target nucleic acids are DETECTED. The SARS-CoV-2 RNA is generally detectable in upper and lower  respiratory specimens dur ing the acute phase of infection.  Positive  results are indicative of active infection with SARS-CoV-2.  Clinical  correlation with patient history and other diagnostic information is  necessary to determine patient infection status.  Positive results do  not rule out bacterial infection or co-infection with other viruses. If result is PRESUMPTIVE POSTIVE SARS-CoV-2 nucleic acids MAY BE PRESENT.   A presumptive positive result was obtained on the submitted specimen  and confirmed on repeat testing.  While 2019 novel coronavirus  (SARS-CoV-2) nucleic acids may be present in the submitted sample  additional confirmatory testing may be necessary for epidemiological  and / or clinical management purposes  to differentiate between  SARS-CoV-2 and other Sarbecovirus currently known to infect humans.  If  clinically indicated additional testing with an alternate test  methodology [phone]) is advised. The SARS-CoV-2 RNA is generally  detectable in upper and lower respiratory sp ecimens during the acute  phase of infection. The expected result is Negative. Fact Sheet for Patients:  StrictlyIdeas.no Fact Sheet for Healthcare Providers: BankingDealers.co.za This test is not yet approved or cleared by the [place] FDA and has been authorized for detection and/or diagnosis of SARS-CoV-2 by FDA under an Emergency Use Authorization (EUA).  This EUA will remain in effect (meaning this test can be used) for the duration of the COVID-19 declaration under Section 564(b)(1) of the Act, 21 U.S.C. section 360bbb-3(b)(1), unless the authorization is terminated or revoked sooner. Performed at [facility], [place] [address], [place], [place] [address]   Surgical PCR screen     Status: Abnormal   Collection Time: 03/30/19  4:55 PM   Specimen: Nasal Mucosa; Nasal Swab  Result Value Ref Range Status   MRSA, PCR NEGATIVE NEGATIVE Final   Staphylococcus aureus POSITIVE (A) NEGATIVE Final    Comment: (NOTE) The Xpert SA Assay (FDA approved for NASAL specimens in patients 28 years of age and older), is one component of a comprehensive surveillance program. It is not intended to diagnose infection nor to guide or monitor treatment. Performed at [facility], [place] [address] [place], [place] [address]          Radiology Studies: Pelvis Portable  Result Date: 04/01/2019 CLINICAL DATA:  ORIF left hip fracture. EXAM: PORTABLE PELVIS 1-2 VIEWS COMPARISON:  Preoperative imaging. FINDINGS: Medullary rod with distal locking and trans trochanteric screw fixation of intertrochanteric femur fracture. Fracture is in improved alignment compared to preoperative imaging. No new periprosthetic lucency or fracture. Recent postsurgical change includes air  and edema in the soft tissues and joint. Surgical hardware in the right femur partially included. Bones are under mineralized. There are vascular calcifications. IMPRESSION: ORIF of left intertrochanteric femur fracture. No immediate postoperative complication. Electronically Signed   By: Keith Rake M.D.   On: 04/01/2019 22:46   Dg Swallowing Func-speech Pathology  Result Date: 04/02/2019 Objective Swallowing Evaluation: Type of Study: MBS-Modified Barium Swallow Study  Patient Details Name: Aaron Frye MRN: [id number] Date of Birth: Nov 01, 1924 Today's Date: 04/02/2019 Time: SLP Start Time (ACUTE ONLY): 1000 -SLP Stop Time (ACUTE ONLY): 1025 SLP Time Calculation (min) (ACUTE ONLY): 25 min Past Medical History: Past Medical History: Diagnosis Date . Atrial fibrillation ([place])   Persisent  . CVA (cerebral vascular accident) ([place])  . Femur fracture, right [facility]) March 2016 . HLD (hyperlipidemia)  . HTN (hypertension)  . Mitral stenosis  . Non-ischemic cardiomyopathy ([place])   LVEF=35-40% by echo 2009 . Prostate cancer ([place])  . TIA (transient ischemic attack)  Past Surgical History: Past  Surgical History: Procedure Laterality Date . APPENDECTOMY   . INTRAMEDULLARY (IM) NAIL INTERTROCHANTERIC Right 09/24/2014  Procedure: INTRAMEDULLARY (IM) NAIL INTERTROCHANTRIC;  Surgeon: Rod Can, MD;  Location: [place];  Service: Orthopedics;  Laterality: Right; . TONSILLECTOMY   HPI: 85 y.o. male with medical history significant for atrial fibrillation on warfarin, history of CVA, hypertension, chronic diastolic CHF, COPD and restrictive lung disease with chronic hypoxic respiratory failure, pulmonary hypertension, and severe mitral stenosis, now presenting to the emergency department with severe left hip pain after a fall at home.  Patient had been in his usual state of health and was having an uneventful day when he went to sit in a chair, missed the chair, landed on the floor, did not hit his head or lose consciousness,  but began to experience severe pain at the left hip. x-rays showed a left intertroch fx and orthopedic surgery was consulted.  Subjective: Pt was alert and cooperative Assessment / Plan / Recommendation CHL IP CLINICAL IMPRESSIONS 04/02/2019 Clinical Impression Aaron Frye is a 85 year old male who presents with moderately severe oropharyngeal dysphagia with resultant silent aspiration of thin liquid and nectar-thick liquid on today's examination.  Pt additionally exhibited laryngeal penetration of honey-thick liquid via straw sip.  Pt's dysphagia is secondary to generalized oropharyngeal weakness and reduced laryngeal closure.  Chin tuck maneuver was unsuccessful in eliminating aspiration events.  Pt exhibited moderate pharyngeal residue throughout this evaluation which he was unable to clear with cued cough and that he intermittently penetrated during all trials.  Oral phase was remarkable for reduced lingual strength resulting in oral residue with all consistencies.  Pharyngeal phase was remarkable for reduced hyolaryngeal elevation/excursion resulting in vallecular and pyriform residue, reduced BOT retraction resulting in vallecular residue, and reduced laryngeal closure resulting in aspiration before the swallow.  Pt with reduced laryngeal sensation evidenced by no attempt to clear aspirated material with a spontaneous cough.  Pharyngoesophageal phase was remarkable for a suspected CP bar at the level of C4.  Recommend Dysphagia 2 (ground) solids and honey-thick liquids (no straws); however, pt is at an increased risk for aspiration with all consistencies secondary to reduced laryngeal closure and pharyngeal residue.  Due to full code status and moderate-severe aspiration risk, pt is not appropriate for the [employer] Protocol at this time.  SLP Visit Diagnosis Dysphagia, oropharyngeal phase (R13.12) Attention and concentration deficit following -- Frontal lobe and executive function deficit  following -- Impact on safety and function Other (comment)   CHL IP TREATMENT RECOMMENDATION 04/02/2019 Treatment Recommendations Therapy as outlined in treatment plan below   Prognosis 04/02/2019 Prognosis for Safe Diet Advancement Guarded Barriers to Reach Goals Severity of deficits Barriers/Prognosis Comment -- CHL IP DIET RECOMMENDATION 04/02/2019 SLP Diet Recommendations Dysphagia 2 (Fine chop) solids;Honey thick liquids Liquid Administration via Cup Medication Administration Crushed with puree Compensations Slow rate;Small sips/bites Postural Changes Remain semi-upright after after feeds/meals (Comment);Seated upright at 90 degrees   CHL IP OTHER RECOMMENDATIONS 04/02/2019 Recommended Consults -- Oral Care Recommendations Oral care BID Other Recommendations Order thickener from pharmacy   CHL IP FOLLOW UP RECOMMENDATIONS 04/02/2019 Follow up Recommendations 24 hour supervision/assistance;Skilled Nursing facility   [facility] IP FREQUENCY AND DURATION 04/02/2019 Speech Therapy Frequency (ACUTE ONLY) min 2x/week Treatment Duration 2 weeks      CHL IP ORAL PHASE 04/02/2019 Oral Phase Impaired Oral - Pudding Teaspoon -- Oral - Pudding Cup -- Oral - Honey Teaspoon Delayed oral transit;Weak lingual manipulation;Premature spillage Oral - Honey Cup Delayed oral  transit;Weak lingual manipulation;Premature spillage Oral - Nectar Teaspoon Weak lingual manipulation;Delayed oral transit;Premature spillage Oral - Nectar Cup Delayed oral transit;Weak lingual manipulation;Premature spillage Oral - Nectar Straw Delayed oral transit;Weak lingual manipulation;Premature spillage Oral - Thin Teaspoon Weak lingual manipulation;Delayed oral transit;Premature spillage Oral - Thin Cup Delayed oral transit;Weak lingual manipulation;Premature spillage Oral - Thin Straw Delayed oral transit;Weak lingual manipulation;Premature spillage Oral - Puree Weak lingual manipulation;Delayed oral transit Oral - Mech Soft -- Oral - Regular Impaired  mastication;Weak lingual manipulation;Delayed oral transit Oral - Multi-Consistency -- Oral - Pill -- Oral Phase - Comment --  CHL IP PHARYNGEAL PHASE 04/02/2019 Pharyngeal Phase Impaired Pharyngeal- Pudding Teaspoon -- Pharyngeal -- Pharyngeal- Pudding Cup -- Pharyngeal -- Pharyngeal- Honey Teaspoon Delayed swallow initiation-vallecula;Reduced laryngeal elevation;Reduced anterior laryngeal mobility;Reduced tongue base retraction;Pharyngeal residue - pyriform;Pharyngeal residue - valleculae Pharyngeal Material does not enter airway Pharyngeal- Honey Cup Reduced anterior laryngeal mobility;Reduced laryngeal elevation;Reduced tongue base retraction;Delayed swallow initiation-vallecula;Pharyngeal residue - valleculae;Pharyngeal residue - pyriform Pharyngeal Material does not enter airway Pharyngeal- Nectar Teaspoon Delayed swallow initiation-vallecula;Reduced anterior laryngeal mobility;Reduced laryngeal elevation;Reduced airway/laryngeal closure;Pharyngeal residue - pyriform;Pharyngeal residue - valleculae;Reduced tongue base retraction;Penetration/Aspiration before swallow Pharyngeal Material enters airway, remains ABOVE vocal cords and not ejected out Pharyngeal- Nectar Cup Moderate aspiration;Delayed swallow initiation-vallecula;Reduced anterior laryngeal mobility;Reduced laryngeal elevation;Reduced airway/laryngeal closure;Reduced tongue base retraction;Penetration/Aspiration before swallow;Pharyngeal residue - valleculae;Pharyngeal residue - pyriform Pharyngeal Material enters airway, passes BELOW cords without attempt by patient to eject out (silent aspiration) Pharyngeal- Nectar Straw Delayed swallow initiation-vallecula;Reduced anterior laryngeal mobility;Reduced laryngeal elevation;Reduced airway/laryngeal closure;Reduced tongue base retraction;Penetration/Aspiration before swallow;Pharyngeal residue - valleculae;Pharyngeal residue - pyriform Pharyngeal Material enters airway, remains ABOVE vocal cords and not  ejected out Pharyngeal- Thin Teaspoon Delayed swallow initiation-pyriform sinuses;Reduced anterior laryngeal mobility;Reduced laryngeal elevation;Reduced airway/laryngeal closure;Reduced tongue base retraction;Penetration/Aspiration before swallow;Pharyngeal residue - valleculae;Pharyngeal residue - pyriform Pharyngeal Material enters airway, remains ABOVE vocal cords and not ejected out Pharyngeal- Thin Cup Delayed swallow initiation-pyriform sinuses;Reduced anterior laryngeal mobility;Reduced laryngeal elevation;Reduced airway/laryngeal closure;Reduced tongue base retraction;Penetration/Aspiration before swallow;Moderate aspiration;Pharyngeal residue - valleculae;Pharyngeal residue - pyriform Pharyngeal Material enters airway, passes BELOW cords without attempt by patient to eject out (silent aspiration) Pharyngeal- Thin Straw Delayed swallow initiation-pyriform sinuses;Reduced anterior laryngeal mobility;Reduced laryngeal elevation;Reduced airway/laryngeal closure;Penetration/Aspiration before swallow;Pharyngeal residue - valleculae;Pharyngeal residue - pyriform;Moderate aspiration Pharyngeal Material enters airway, passes BELOW cords without attempt by patient to eject out (silent aspiration) Pharyngeal- Puree Delayed swallow initiation-vallecula;Reduced anterior laryngeal mobility;Reduced laryngeal elevation;Reduced tongue base retraction;Pharyngeal residue - valleculae;Pharyngeal residue - pyriform Pharyngeal -- Pharyngeal- Mechanical Soft -- Pharyngeal -- Pharyngeal- Regular Delayed swallow initiation-vallecula;Reduced laryngeal elevation;Reduced airway/laryngeal closure;Reduced tongue base retraction;Pharyngeal residue - valleculae;Pharyngeal residue - pyriform Pharyngeal -- Pharyngeal- Multi-consistency -- Pharyngeal -- Pharyngeal- Pill (No Data) Pharyngeal -- Pharyngeal Comment --  CHL IP CERVICAL ESOPHAGEAL PHASE 04/02/2019 Cervical Esophageal Phase Impaired Pudding Teaspoon -- Pudding Cup -- Honey  Teaspoon -- Honey Cup -- Nectar Teaspoon -- Nectar Cup -- Nectar Straw -- Thin Teaspoon -- Thin Cup -- Thin Straw -- Puree -- Mechanical Soft -- Regular -- Multi-consistency -- Pill -- Cervical Esophageal Comment Suspected CP bar at the level of C4  Bretta Bang, M.S., [place] Office: [phone] Elvia Collum [place] 04/02/2019, 12:35 PM              Dg C-arm 1-60 Min  Result Date: 04/01/2019 CLINICAL DATA:  Known left intratrochanteric fracture EXAM: LEFT FEMUR 2 VIEWS; DG C-ARM 1-60 MIN COMPARISON:  03/30/2019 FLUOROSCOPY TIME:  Fluoroscopy Time:  41 seconds Radiation Exposure Index (if provided  by the fluoroscopic device): Not available Number of Acquired Spot Images: 3 FINDINGS: Proximal medullary rod with fixation screw traversing the femoral neck is seen. The fracture fragments are in near anatomic alignment. IMPRESSION: Status post ORIF of left femoral fracture. Electronically Signed   By: Inez Catalina M.D.   On: 04/01/2019 21:58   Dg Femur Min 2 Views Left  Result Date: 04/01/2019 CLINICAL DATA:  Known left intratrochanteric fracture EXAM: LEFT FEMUR 2 VIEWS; DG C-ARM 1-60 MIN COMPARISON:  03/30/2019 FLUOROSCOPY TIME:  Fluoroscopy Time:  41 seconds Radiation Exposure Index (if provided by the fluoroscopic device): Not available Number of Acquired Spot Images: 3 FINDINGS: Proximal medullary rod with fixation screw traversing the femoral neck is seen. The fracture fragments are in near anatomic alignment. IMPRESSION: Status post ORIF of left femoral fracture. Electronically Signed   By: Inez Catalina M.D.   On: 04/01/2019 21:58        Scheduled Meds: . atorvastatin  40 mg Oral q1800  . Chlorhexidine Gluconate Cloth  6 each Topical Daily  . docusate sodium  100 mg Oral BID  . enoxaparin (LOVENOX) injection  40 mg Subcutaneous Q24H  . feeding supplement (ENSURE ENLIVE)  237 mL Oral BID BM  . insulin aspart  0-8 Units Subcutaneous Q4H  . metoprolol succinate  75 mg  Oral Daily  . mupirocin ointment  1 application Nasal BID  . Warfarin - Pharmacist Dosing Inpatient   Does not apply q1800   Continuous Infusions: . sodium chloride       LOS: 4 days     Cordelia Poche, MD [facility] 04/03/2019, 7:43 AM  If 7PM-7AM, please contact night-coverage [email or web address]

## 2019-04-03 NOTE — Plan of Care (Addendum)
  Problem: Activity: Goal: Ability to avoid complications of mobility impairment will improve Outcome: Progressing   Problem: Skin Integrity: Goal: Will show signs of wound healing Outcome: Progressing

## 2019-04-03 NOTE — Progress Notes (Addendum)
Nutrition Follow-up   RD working remotely.  DOCUMENTATION CODES:   Not applicable  INTERVENTION:  Provide Magic cup TID with meals, each supplement provides 290 kcal and 9 grams of protein  Encourage adequate PO intake.   NUTRITION DIAGNOSIS:   Increased nutrient needs related to chronic illness(CHF, COPD) as evidenced by estimated needs; ongoing  GOAL:   Patient will meet greater than or equal to 90% of their needs; progressing  MONITOR:   PO intake, Supplement acceptance, Skin, Weight trends, Labs, I & O's  REASON FOR ASSESSMENT:   Consult Hip fracture protocol  ASSESSMENT:   85 y.o. male with medical history significant for atrial fibrillation on warfarin, history of CVA, hypertension, chronic diastolic CHF, COPD and restrictive lung disease with chronic hypoxic respiratory failure, pulmonary hypertension, and severe mitral stenosis, now presenting to the emergency department with severe left hip pain after a fall at home. Radiographs of the left hip demonstrate comminuted, impacted, and slightly varus angulated intertrochanteric left femur fracture. S/P 9/30: Left IM femoral nail  Pt underwent MBS yesterday. Pt with moderately severe oropharyngeal dysphagia with resultant silent aspiration of thin and nectar thick liquids. Pt is currently on a dysphagia 2 diet with honey thick liquids. Meal completion has been 50-75%. RD to order Magic cup at meals to aid in adequate caloric and protein needs. Will discontinue Ensure due to aspiration risk per RN.   Labs and medications reviewed.   Diet Order:   Diet Order            DIET DYS 2 Room service appropriate? Yes with Assist; Fluid consistency: Honey Thick  Diet effective now              EDUCATION NEEDS:   Not appropriate for education at this time  Skin:  Skin Assessment: Skin Integrity Issues: Skin Integrity Issues:: Incisions Incisions: L thigh, L hip  Last BM:  Unknown  Height:   Ht Readings from Last 1  Encounters:  04/01/19 5\' 7"  (1.702 m)    Weight:   Wt Readings from Last 1 Encounters:  04/01/19 64.2 kg    Ideal Body Weight:  67.27 kg  BMI:  Body mass index is 22.17 kg/m.  Estimated Nutritional Needs:   Kcal:  1650-1850  Protein:  75-85 grams  Fluid:  1.6 - 2.8 L/day   Corrin Parker, MS, RD, LDN Pager # [phone] After hours/ weekend pager # [phone]

## 2019-04-03 NOTE — Progress Notes (Signed)
  Speech Language Pathology Treatment: Dysphagia  Patient Details Name: Aaron Frye MRN: [id number] DOB: 11/05/1924 Today's Date: 04/03/2019 Time: 1208-1228 SLP Time Calculation (min) (ACUTE ONLY): 20 min  Assessment / Plan / Recommendation Clinical Impression  Pt was encountered awake/alert in bed with daughter present at bedside.  Pt reported that he did not consume his Dysphagia 2 (fine chop) lunch meal tray secondary to it not being appealing.  Pt stated that he preferred more "regular" consistency foods.  Pt was observed with trials of regular solids and honey-thick liquid via cup sip.  Pt exhibited prolonged, but effective mastication of regular solids with trace oral residue that he was sensate to and cleared with a liquid wash.  Pt denied globus sensation in this session, but reported that it occasionally occurs following consumption of solids.  No clinical s/sx of aspiration were observed with honey-thick liquid or solids.  Pt and daughter were thoroughly educated regarding results of MBS, current diet recommendations, and compensatory strategies.  Additionally educated regarding possibility of initiating [employer] Protocol with known risk of aspiration.  Pt/daughter declined at this time. SLP provided daughter with handout regarding various commercial thickeners for home use.  Recommend diet upgrade to Dysphagia 3 (mech soft) solids and continuation of honey-thick liquid (no straw) at this time.     HPI HPI: 85 y.o. male with medical history significant for atrial fibrillation on warfarin, history of CVA, hypertension, chronic diastolic CHF, COPD and restrictive lung disease with chronic hypoxic respiratory failure, pulmonary hypertension, and severe mitral stenosis, now presenting to the emergency department with severe left hip pain after a fall at home.  Patient had been in his usual state of health and was having an uneventful day when he went to sit in a chair, missed the  chair, landed on the floor, did not hit his head or lose consciousness, but began to experience severe pain at the left hip. x-rays showed a left intertroch fx and orthopedic surgery was consulted.      SLP Plan  Continue with current plan of care       Recommendations  Diet recommendations: Dysphagia 3 (mechanical soft);Honey-thick liquid Liquids provided via: Cup Medication Administration: Crushed with puree Supervision: Intermittent supervision to cue for compensatory strategies Compensations: Slow rate;Small sips/bites Postural Changes and/or Swallow Maneuvers: Seated upright 90 degrees;Upright 30-60 min after meal                SLP Visit Diagnosis: Dysphagia, oropharyngeal phase (R13.12) Plan: Continue with current plan of care       Bretta Bang, M.S., [place] Office: [phone]               [place] 04/03/2019, 1:11 PM

## 2019-04-03 NOTE — Anesthesia Postprocedure Evaluation (Signed)
Anesthesia Post Note  Patient: Aaron Frye  Procedure(s) Performed: INTRAMEDULLARY (IM) NAIL FEMORAL (Left Hip)     Patient location during evaluation: PACU Anesthesia Type: General Level of consciousness: awake and alert Pain management: pain level controlled Vital Signs Assessment: post-procedure vital signs reviewed and stable Respiratory status: spontaneous breathing, nonlabored ventilation, respiratory function stable and patient connected to nasal cannula oxygen Cardiovascular status: blood pressure returned to baseline and stable Postop Assessment: no apparent nausea or vomiting Anesthetic complications: no    Last Vitals:  Vitals:   04/03/19 0811 04/03/19 0926  BP: 105/63 105/63  Pulse: 87 87  Resp: 17   Temp: 37.6 C   SpO2: 91%     Last Pain:  Vitals:   04/03/19 0811  TempSrc: Oral  PainSc:                  [place] S

## 2019-04-03 NOTE — Progress Notes (Signed)
Physical Therapy Treatment Patient Details Name: Aaron Frye MRN: [id number] DOB: [birth year] Today's Date: 04/03/2019    History of Present Illness Pt is a 85 y.o. male admitted 03/30/19 after fall at home sustaining L intertrochanteric hip fx. S/p L femur IMN 9/30. PMH includes afib, CVA, R femur fx s/p IMN (08/2014), HTN, cardiomyopathy.    PT Comments    Pt participated in LE strengthening therex (see below). Just prior to arrival, pt had performed transfers x2 with daughter and nurse tech. He was very fatigued and unable to tolerate more than therex this session. Pt would continue to benefit from skilled physical therapy services at this time while admitted and after d/c to address the below listed limitations in order to improve overall safety and independence with functional mobility.    Follow Up Recommendations  Home health PT;Supervision/Assistance - 24 hour     Equipment Recommendations  Wheelchair (measurements PT);Wheelchair cushion (measurements PT)    Recommendations for Other Services       Precautions / Restrictions Precautions Precautions: Fall Restrictions Weight Bearing Restrictions: Yes LLE Weight Bearing: Weight bearing as tolerated    Mobility  Bed Mobility               General bed mobility comments: pt OOB in recliner chair upon arrival  Transfers                 General transfer comment: pt had just performed sit<>stand and stand-pivot transfers x2 with nurse tech and daughter just prior to PT arrival and was very fatigued  Ambulation/Gait                 Stairs             Wheelchair Mobility    Modified Rankin (Stroke Patients Only)       Balance                                            Cognition Arousal/Alertness: Awake/alert Behavior During Therapy: Flat affect Overall Cognitive Status: Within Functional Limits for tasks assessed                                         Exercises General Exercises - Lower Extremity Ankle Circles/Pumps: AROM;Both;20 reps;Seated Gluteal Sets: AROM;Strengthening;Both;10 reps;Seated Heel Slides: AAROM;Left;10 reps;Seated Hip ABduction/ADduction: AAROM;Left;AROM;Right;10 reps;Seated Straight Leg Raises: AAROM;Left;10 reps;Supine    General Comments        Pertinent Vitals/Pain Pain Assessment: Faces Faces Pain Scale: Hurts little more Pain Location: LLE with mobility Pain Descriptors / Indicators: Guarding;Grimacing Pain Intervention(s): Monitored during session;Repositioned    Home Living                      Prior Function            PT Goals (current goals can now be found in the care plan section) Acute Rehab PT Goals PT Goal Formulation: With patient/family Time For Goal Achievement: 04/16/19 Potential to Achieve Goals: Good Progress towards PT goals: Progressing toward goals    Frequency    Min 5X/week      PT Plan Current plan remains appropriate    Co-evaluation              AM-PAC PT "6 Clicks"  Mobility   Outcome Measure  Help needed turning from your back to your side while in a flat bed without using bedrails?: A Lot Help needed moving from lying on your back to sitting on the side of a flat bed without using bedrails?: A Lot Help needed moving to and from a bed to a chair (including a wheelchair)?: A Lot Help needed standing up from a chair using your arms (e.g., wheelchair or bedside chair)?: A Lot Help needed to walk in hospital room?: A Lot Help needed climbing 3-5 steps with a railing? : A Lot 6 Click Score: 12    End of Session   Activity Tolerance: Patient tolerated treatment well Patient left: in chair;with call bell/phone within reach;with chair alarm set;with family/visitor present Nurse Communication: Mobility status PT Visit Diagnosis: Other abnormalities of gait and mobility (R26.89);Pain;Muscle weakness (generalized) (M62.81) Pain - Right/Left:  Left Pain - part of body: Leg     Time: 1415-1435 PT Time Calculation (min) (ACUTE ONLY): 20 min  Charges:  $Therapeutic Exercise: 8-22 mins                     Sherie Don, PT, DPT  Acute Rehabilitation Services Pager [phone] Office [place] 04/03/2019, 4:13 PM

## 2019-04-03 NOTE — Progress Notes (Addendum)
ANTICOAGULATION CONSULT NOTE -Follow up  Pharmacy Consult for Warfarin  Indication: history of Afib and CVA  Allergies  Allergen Reactions  . Penicillins Rash and Other (See Comments)    Has patient had a PCN reaction causing immediate rash, facial/tongue/throat swelling, SOB or lightheadedness with hypotension: Yes Has patient had a PCN reaction causing severe rash involving mucus membranes or skin necrosis: Unk Has patient had a PCN reaction that required hospitalization: No Has patient had a PCN reaction occurring within the last 10 years: No If all of the above answers are "NO", then may proceed with Cephalosporin use.     Patient Measurements: Height: 5\' 7"  (170.2 cm) Weight: 141 lb 8.6 oz (64.2 kg) IBW/kg (Calculated) : 66.1   Vital Signs: Temp: 99.7 F (37.6 C) (10/02 0811) Temp Source: Oral (10/02 0811) BP: 105/63 (10/02 0926) Pulse Rate: 87 (10/02 0926)  Labs: Recent Labs    04/01/19 1813 04/02/19 0859 04/03/19 0316  HGB  --  10.5* 10.4*  HCT  --  33.8* 32.3*  PLT  --  149* 162  LABPROT 16.9* 16.2* 17.5*  INR 1.4* 1.3* 1.5*  CREATININE  --  1.10 1.57*    Estimated Creatinine Clearance: 27.3 mL/min (A) (by C-G formula based on SCr of 1.57 mg/dL (H)).   Medical History: Past Medical History:  Diagnosis Date  . Atrial fibrillation ([place])    Persisent   . CVA (cerebral vascular accident) ([place])   . Femur fracture, right [facility]) March 2016  . HLD (hyperlipidemia)   . HTN (hypertension)   . Mitral stenosis   . Non-ischemic cardiomyopathy ([place])    LVEF=35-40% by echo 2009  . Prostate cancer ([place])   . TIA (transient ischemic attack)     Assessment:  CC/HPI: 85 y.o male admitted 03/30/19 with left hip fracture s/p fall at home.  PMH: Afib, CVA, HTN, chronic diastolic CHF, COPD and restrictive lung disease with chronic hypoxic respiratory failure, pulmonary hypertension, and severe mitral stenosis  Anticoag: h/o afib and CVA, on warfarin PTA. INR was 2.7  on admit 9/28.  INR reversed for surgery, with multiple doses of Vitamin K.  9/28: Vit K 5mg  po 9/29: Vit K 2mg  IV  S/p hip surgery left IM femoral nail 04/01/19. Warfarin resumed  Enoxaparin 40mg  sq daily started pod #1 (04/02/19) as discussed with Orthopedic surgeon on 04/01/19.  Today INR = 1.5 trending up toward target goal 2-3  s/p multiple vitamin K doses.  Hgb 10.4 low/stable, PLTC improved to 162k.  No bleeding reported.  Meal completion has been 50-75%. SCr has increased today from 1.10 up to 1.57.   PTA warfarin dose, 5 mg daily  LD PTA taken  9/27 1800  Goal of Therapy:  INR = 2 - 3 Monitor platelets by anticoagulation protocol: Yes   Plan:  Warfarin 6 mg tonight MD continues Enoxaparin 40 mg sq daily Daily INR  Nicole Cella, RPh Clinical Pharmacist [phone] Please utilize Amion for appropriate phone number to reach the unit pharmacist ([place]) 04/03/2019,10:06 AM

## 2019-04-03 NOTE — Progress Notes (Signed)
    Durable Medical Equipment  (From admission, onward)         Start     Ordered   04/03/19 1206  For home use only DME lightweight manual wheelchair with seat cushion  Once     04/03/19 1206        Patient suffers from L hip fx which impairs their ability to perform daily activities like walking in the home.  A walker alone will not resolve the issues with performing activities of daily living. A wheelchair will allow patient to safely perform daily activities.  The patient can self propel in the home or has a caregiver who can provide assistance.    Governor Rooks, PTA Acute Rehabilitation Services Pager [phone] Office [phone]

## 2019-04-04 LAB — BASIC METABOLIC PANEL
Anion gap: 10 (ref 5–15)
BUN: 66 mg/dL — ABNORMAL HIGH (ref 8–23)
CO2: 28 mmol/L (ref 22–32)
Calcium: 8.6 mg/dL — ABNORMAL LOW (ref 8.9–10.3)
Chloride: 103 mmol/L (ref 98–111)
Creatinine, Ser: 1.13 mg/dL (ref 0.61–1.24)
GFR calc Af Amer: >60 mL/min (ref >60)
GFR calc non Af Amer: 56 mL/min — ABNORMAL LOW (ref >60)
Glucose, Bld: 125 mg/dL — ABNORMAL HIGH (ref 70–99)
Potassium: 4.5 mmol/L (ref 3.5–5.1)
Sodium: 141 mmol/L (ref 135–145)

## 2019-04-04 LAB — GLUCOSE, CAPILLARY
Glucose-Capillary: 101 mg/dL — ABNORMAL HIGH (ref 70–99)
Glucose-Capillary: 110 mg/dL — ABNORMAL HIGH (ref 70–99)
Glucose-Capillary: 115 mg/dL — ABNORMAL HIGH (ref 70–99)
Glucose-Capillary: 120 mg/dL — ABNORMAL HIGH (ref 70–99)
Glucose-Capillary: 127 mg/dL — ABNORMAL HIGH (ref 70–99)

## 2019-04-04 LAB — CBC
HCT: 31 % — ABNORMAL LOW (ref 39.0–52.0)
Hemoglobin: 9.9 g/dL — ABNORMAL LOW (ref 13.0–17.0)
MCH: 27.1 pg (ref 26.0–34.0)
MCHC: 31.9 g/dL (ref 30.0–36.0)
MCV: 84.9 fL (ref 80.0–100.0)
Platelets: 165 10*3/uL (ref 150–400)
RBC: 3.65 MIL/uL — ABNORMAL LOW (ref 4.22–5.81)
RDW: 16.7 % — ABNORMAL HIGH (ref 11.5–15.5)
WBC: 13.5 10*3/uL — ABNORMAL HIGH (ref 4.0–10.5)
nRBC: 0 % (ref 0.0–0.2)

## 2019-04-04 LAB — PROTIME-INR
INR: 1.5 — ABNORMAL HIGH (ref 0.8–1.2)
Prothrombin Time: 18.2 seconds — ABNORMAL HIGH (ref 11.4–15.2)

## 2019-04-04 MED ORDER — WARFARIN SODIUM 7.5 MG PO TABS: 7.5000 mg | ORAL_TABLET | Freq: Once | ORAL | Status: DC

## 2019-04-04 NOTE — TOC Transition Note (Signed)
Transition of Care [facility]) - CM/SW Discharge Note   Patient Details  Name: Aaron Frye MRN: [id number] Date of Birth: Feb 12, 1925  Transition of Care [facility]) CM/SW Contact:  Alexander Mt, [place] Phone Number: 04/04/2019, 2:30 PM   Clinical Narrative:    PTAR papers sent to 5N.  Pt RN alerted to look and was provided PTAR number to arrange whenever she is ready to dc pt.      Barriers to Discharge: Continued Medical Work up   Patient Goals and CMS Choice Patient states their goals for this hospitalization and ongoing recovery are:: "to return home" CMS Medicare.gov Compare Post Acute Care list provided to:: Other (Comment Required) Choice offered to / list presented to : Adult Children  Discharge Placement  Discharge Plan and Services In-house Referral: NA Discharge Planning Services: NA(PTAR transportation) Post Acute Care Choice: Durable Medical Equipment, Home Health          DME Arranged: Wheelchair manual DME Agency: [place] Date DME Agency Contacted: 04/02/19 Time DME Agency Contacted: [id number] Representative spoke with at DME Agency: Learta Codding HH Arranged: RN, Disease Management, PT, OT, Nurse's Aide, Refused SNF, Social Work [employer] Agency: [facility] [employer] Date [place]: 04/02/19 Time [place]: 1532 Representative spoke with at [place]: [place]  Social Determinants of Health ([place]) Interventions     Readmission Risk Interventions No flowsheet data found.

## 2019-04-04 NOTE — TOC Progression Note (Addendum)
Transition of Care [facility]) - Progression Note    Patient Details  Name: Aaron Frye MRN: [id number] Date of Birth: [birth year]  Transition of Care [facility]) CM/SW [place], [place] Phone Number: 04/04/2019, 11:38 AM  Clinical Narrative:    12:40pm- PTAR papers tubed to floor at pt RN request. She is aware.   11:38am- CSW clearing consult for SNF placement- per RNCM note "daughter will arranged 24/7 assistance and stay with patient until those services are arranged prior to returning to [place]. CMS HH Compare and DME preference provided with [facility] selected. DME referral given to Learta Codding [facility] liaison); [place] referral given to Joen Laura RN [facility] liaison); AVS updated. CM emailed patients daughter "[place] resources", as requested. CM team will continue to follow."     Expected Discharge Plan: [place] Barriers to Discharge: Continued Medical Work up  Expected Discharge Plan and Services Expected Discharge Plan: [place] In-house Referral: NA Discharge Planning Services: NA(PTAR transportation) Post Acute Care Choice: Durable Medical Equipment, [place] arrangements for the past 2 months: Single Family Home                 DME Arranged: Wheelchair manual DME Agency: [employer] Date DME Agency Contacted: 04/02/19 Time DME Agency Contacted: [id number] Representative spoke with at DME Agency: Learta Codding HH Arranged: RN, Disease Management, PT, OT, Nurse's Aide, Refused SNF, Social Work [employer] Agency: [facility] [employer] Date [place]: 04/02/19 Time [place]: 1532 Representative spoke with at [place]: [place] ([place]) Interventions    Readmission Risk Interventions No flowsheet data found.

## 2019-04-04 NOTE — Progress Notes (Signed)
   Subjective: 3 Days Post-Op Procedure(s) (LRB): INTRAMEDULLARY (IM) NAIL FEMORAL (Left)  Pt sitting comfortably in no acute distress Pain improved as compared to yesterday Denies any new symptoms or issues D/c planning in progress Patient reports pain as mild.  Objective:   VITALS:   Vitals:   04/04/19 0435 04/04/19 0751  BP: (!) 99/54 118/63  Pulse: 70 84  Resp: 15 16  Temp: 98.1 F (36.7 C) 97.7 F (36.5 C)  SpO2: 98% 98%    Left hip/leg incisions healing well nv intact distally No rashes or edema distally Limited pain with rom  LABS Recent Labs    04/02/19 0859 04/03/19 0316  HGB 10.5* 10.4*  HCT 33.8* 32.3*  WBC 14.5* 20.1*  PLT 149* 162    Recent Labs    04/02/19 0859 04/03/19 0316 04/03/19 1844  NA 138 136 137  K 5.1 4.9 4.4  BUN 45* 71* 75*  CREATININE 1.10 1.57* 1.43*  GLUCOSE 217* 224* 147*     Assessment/Plan: 3 Days Post-Op Procedure(s) (LRB): INTRAMEDULLARY (IM) NAIL FEMORAL (Left) Continue PT/OT D/c planning Pain management as needed Pulmonary toilet    Brad Luna [place], MPAS [facility] is now [employer] [facility] [address] [place], [place], [place] [address] Phone: [phone] [email or web address] Facebook  [employer]

## 2019-04-04 NOTE — Progress Notes (Signed)
Modena Jansky Decelles to be D/C'd per MD order. Discussed with the patient and [name], patient's daughter, and all questions fully answered. ? VSS, Skin clean, dry and intact without evidence of skin break down, no evidence of skin tears noted. ? IV catheter discontinued intact. Site without signs and symptoms of complications. Dressing and pressure applied. ? An After Visit Summary was printed and given to the patient. Patient informed where to pickup prescriptions. ? D/c education completed with patient/family including follow up instructions, medication list, d/c activities limitations if indicated, with other d/c instructions as indicated by MD - patient able to verbalize understanding, all questions fully answered.  ? Patient instructed to return to ED, call 911, or call MD for any changes in condition.  ? Patient to be escorted via stretcher, and D/C home via PTAR.  Contacted ortho MD for pain medication. Dr Lyla Glassing and or [place], [place] to follow up with patient's pharmacy. Daughter is aware.

## 2019-04-04 NOTE — Discharge Summary (Signed)
Physician Discharge Summary  Aaron Frye [id number] DOB: [birth year] DOA: 03/30/2019  PCP: Nickola Major, MD  Admit date: 03/30/2019 Discharge date: 04/04/2019  Admitted From: Home Disposition: Home  Recommendations for Outpatient Follow-up:  1. Follow up with PCP in 1 week 2. Please obtain BMP/CBC in one week 3. Please follow up on the following pending results: None  Home Health: PT, OT, RN, Aide Equipment/Devices: 3 in 1, wheelchair  Discharge Condition: Stable CODE STATUS: Full code Diet recommendation: Heart healthy   Brief/Interim Summary:  Admission HPI written by Vianne Bulls, MD   Chief Complaint: left hip pain after fall   HPI: Aaron Frye is a 85 y.o. male with medical history significant for atrial fibrillation on warfarin, history of CVA, hypertension, chronic diastolic CHF, COPD and restrictive lung disease with chronic hypoxic respiratory failure, pulmonary hypertension, and severe mitral stenosis, now presenting to the emergency department with severe left hip pain after a fall at home.  Patient had been in his usual state of health and was having an uneventful day when he went to sit in a chair, missed the chair, landed on the floor, did not hit his head or lose consciousness, but began to experience severe pain at the left hip.  EMS was called and noted a deformity.  Patient reports he been in his usual state of health leading up to this.  Reports that he remains very active at home, exercises regularly, and states that he can "easily" go up a flight of stairs without any significant dyspnea or chest pain.  Denies any recent fevers or chills, denies cough, never gets chest pain, and denies any shortness of breath.  ED Course: Upon arrival to the ED, patient is found to be afebrile, saturating well on his usual 2 L/min of supplemental oxygen, and with stable blood pressure.  EKG features rate controlled atrial fibrillation.  Chest x-ray is  notable for suspected pulmonary edema superimposed on chronic interstitial lung disease and possible small right pleural effusion.  Radiographs of the left hip demonstrate comminuted, impacted, and slightly varus angulated intertrochanteric left femur fracture.  Chemistry panel is notable for an elevated BUN to creatinine ratio and glucose 170.  CBC features a leukocytosis to 13,600.  INR is therapeutic at 2.4.  Patient was given fentanyl in the ED, type and screen was performed, and orthopedic surgery was consulted by the ED physician.   Hospital course:  Closed left hip fracture Patient is normally ambulatory and active with his ADLs. He also takes care of his wife, who has dementia. S/p surgery on 9/30. Dysphagia 3 diet. Orthopedic surgery follow-up. Patient/family do not want SNF. Home with PT, OT, RN, aide.  Chronic atrial fibrillation Patient is on metoprolol and Coumadin as an outpatient. Rate controlled currently. Continue metoprolol and Coumadin. INR of 1.5 on discharge.  Essential hypertension Stable. Continue metoprolol  Acute kidney injury New after surgery. Baseline creatinine of 1. Creatinine of 1.57 on 10/2 with an associated BUN of 71. Improved with IV fluids down to 1.13 prior to discharge with a BUN of 66. Will need outpatient repeat labwork.  Leukocytosis Probably stress reaction from surgery. No obvious evidence of infection at this time.  Chronic diastolic heart failure Last EF of 65-70% in 2019.  Chronic respiratory failure with hypoxia Stable. Continue O2 supplementation  Mitral valve stenosis Aortic valve stneosis Noted on last Transthoracic Echocardiogram.   PA artery hypertension Noted on last Transthoracic Echocardiogram.  Hyperlipidemia Continue Lipitor  Discharge Diagnoses:  Principal Problem:   Closed left hip fracture, initial encounter [facility]) Active Problems:   Essential hypertension   Atrial fibrillation, chronic (HCC)   History of  stroke   Diastolic CHF, acute on chronic (HCC)   Hyperglycemia   COPD (chronic obstructive pulmonary disease) (HCC)   Chronic respiratory failure with hypoxia [facility])    Discharge Instructions  Discharge Instructions    Diet - low sodium heart healthy   Complete by: As directed    Increase activity slowly   Complete by: As directed      Allergies as of 04/04/2019      Reactions   Penicillins Rash, Other (See Comments)   Has patient had a PCN reaction causing immediate rash, facial/tongue/throat swelling, SOB or lightheadedness with hypotension: Yes Has patient had a PCN reaction causing severe rash involving mucus membranes or skin necrosis: Unk Has patient had a PCN reaction that required hospitalization: No Has patient had a PCN reaction occurring within the last 10 years: No If all of the above answers are "NO", then may proceed with Cephalosporin use.      Medication List    STOP taking these medications   loratadine 10 MG tablet Commonly known as: CLARITIN     TAKE these medications   acetaminophen 325 MG tablet Commonly known as: TYLENOL Take 325 mg by mouth 2 (two) times daily as needed (for headaches).   Adult One Daily Gummies Chew Chew 1 tablet by mouth daily.   albuterol 108 (90 Base) MCG/ACT inhaler Commonly known as: VENTOLIN HFA Inhale 2 puffs into the lungs every 6 (six) hours as needed for wheezing or shortness of breath.   atorvastatin 40 MG tablet Commonly known as: LIPITOR Take 1 tablet by mouth once daily What changed: when to take this   furosemide 20 MG tablet Commonly known as: LASIX Take 3 tablets (60 mg total) by mouth 2 (two) times daily.   ipratropium-albuterol 0.5-2.5 (3) MG/3ML Soln Commonly known as: DUONEB Inhale 3 mLs into the lungs every 6 (six) hours as needed (for shortness of breath or wheezing).   metoprolol succinate 25 MG 24 hr tablet Commonly known as: TOPROL-XL Take 3 tablets by mouth once daily   moxifloxacin 0.5 %  ophthalmic solution Commonly known as: VIGAMOX Place 1 drop into the right eye See admin instructions. Instill 1 drop into the right eye daily AS DIRECTED for 2 days after ocular injection every three months   OXYGEN Inhale 2 L into the lungs continuous.   potassium chloride SA 20 MEQ tablet Commonly known as: KLOR-CON Take 2 tablets (40 mEq total) by mouth 2 (two) times daily.   warfarin 5 MG tablet Commonly known as: COUMADIN Take as directed. If you are unsure how to take this medication, talk to your nurse or doctor. Original instructions: Take 1 tablet daily or as directed by Coumadin clinic What changed:   how much to take  how to take this  when to take this  additional instructions            Durable Medical Equipment  (From admission, onward)         Start     Ordered   04/04/19 1420  For home use only DME 3 n 1  Once     04/04/19 1419   04/03/19 1206  For home use only DME lightweight manual wheelchair with seat cushion  Once     04/03/19 1206  Follow-up Information    S[place], Aaron Edelman, MD. Schedule an appointment as soon as possible for a visit in 2 weeks.   Specialty: Orthopedic Surgery Why: For wound re-check Contact information: [address] [place] [address] [phone]        [facility] Follow up.   Specialty: Home Health Services Why: Registered Nurse (will be added once evaluated by Physical therapy), Physical and Occupational Therapy, Home Health Aide and a Social Worker-to also assist you with community resources Contact information: [place] [address] [phone]        [facility] Follow up.   Why: wheelchair Contact information: [place] [address] [phone]        Nickola Major, MD. Schedule an appointment as soon as possible for a visit in 1 week(s).   Specialty: Family Medicine Why: Hospital follow-up Contact  information: [address] [place] [address] [phone]        Josue Hector, MD .   Specialty: Cardiology Contact information: [phone]  [place] [address] [phone]          Allergies  Allergen Reactions   Penicillins Rash and Other (See Comments)    Has patient had a PCN reaction causing immediate rash, facial/tongue/throat swelling, SOB or lightheadedness with hypotension: Yes Has patient had a PCN reaction causing severe rash involving mucus membranes or skin necrosis: Unk Has patient had a PCN reaction that required hospitalization: No Has patient had a PCN reaction occurring within the last 10 years: No If all of the above answers are "NO", then may proceed with Cephalosporin use.     Consultations:  Orthopedic surgery   Procedures/Studies: Dg Chest 1 View  Result Date: 03/30/2019 CLINICAL DATA:  Hip fracture, fall EXAM: CHEST  1 VIEW COMPARISON:  Radiograph Nov 15, 2017, CT October 01, 2014 FINDINGS: There are diffuse chronic interstitial changes throughout the lungs. A bandlike area of atelectasis or scarring is present in the right lung base. More hazy opacities are present in the lower lungs with some indistinctness of the vascularity and central cuffing with fissural and septal thickening. Slight blunting of the right costophrenic sulcus suggests a small effusion. No pneumothorax. Cardiomediastinal contours are stable including a calcified tortuous aorta. Degenerative changes are present in the imaged spine and shoulders. No acute osseous or soft tissue abnormality is visualized though the bones are diffusely demineralized. IMPRESSION: 1. Findings consistent with interstitial lung disease/pulmonary edema superimposed on chronic interstitial lung disease. 2. A bandlike area of atelectasis or scarring in the right lung base. 3. Possible small right pleural effusion. 4. No acute traumatic abnormality of the chest wall.  Electronically Signed   By: Lovena Le M.D.   On: 03/30/2019 02:37   Pelvis Portable  Result Date: 04/01/2019 CLINICAL DATA:  ORIF left hip fracture. EXAM: PORTABLE PELVIS 1-2 VIEWS COMPARISON:  Preoperative imaging. FINDINGS: Medullary rod with distal locking and trans trochanteric screw fixation of intertrochanteric femur fracture. Fracture is in improved alignment compared to preoperative imaging. No new periprosthetic lucency or fracture. Recent postsurgical change includes air and edema in the soft tissues and joint. Surgical hardware in the right femur partially included. Bones are under mineralized. There are vascular calcifications. IMPRESSION: ORIF of left intertrochanteric femur fracture. No immediate postoperative complication. Electronically Signed   By: Keith Rake M.D.   On: 04/01/2019 22:46   Ct Hip Left Wo Contrast  Result Date:  03/30/2019 CLINICAL DATA:  Fracture of the left hip, requested by orthopedics preoperatively EXAM: CT OF THE LEFT HIP WITHOUT CONTRAST TECHNIQUE: Multidetector CT imaging of the left hip was performed according to the standard protocol. Multiplanar CT image reconstructions were also generated. COMPARISON:  Same day radiographs FINDINGS: Bones/Joint/Cartilage Redemonstration of the comminuted, impacted intertrochanteric left femur fracture. One of the larger displaced fracture fragments includes much of the footprint of the lesser trochanter. Additional fracture fragments included portion of the insertion sites of the greater trochanter as well. The femoral head remains normally located. A moderate right hip effusion is present, likely reflecting some hemarthrosis given increased attenuation. Remaining bones of the pelvis and sacrum are intact. No diastatic widening of the symphysis pubis or visible portion of the left SI joint. Ligaments Suboptimally assessed by CT. Muscles and Tendons Mild thickening and stranding is present about the quadratus femoris and  gemellus muscle bellies. Suspect mild retraction of the fractured lesser trochanter footprint by the iliopsoas insertion with mild adjacent stranding. No other tendinous discontinuity or retraction is clearly evident. Soft tissues Diffuse soft tissue swelling of the left hip. Left hip hemarthrosis, as above. Included portions of the pelvis demonstrate atherosclerotic calcification of inflow and proximal outflow vessels. Surgical clips are present in the pelvis. Scattered colonic diverticula without focal pericolonic inflammation to suggest diverticulitis. Corporal calcifications of the external genitalia are present as well. IMPRESSION: 1. Comminuted, impacted intertrochanteric left femur fracture as described above. 2. Suspect mild retraction of the fractured lesser trochanter by the iliopsoas with mild adjacent stranding. 3. Thickening and likely intramuscular hemorrhage within the quadratus femoris and gemellus. 4. Moderate right hip effusion, likely reflecting some hemarthrosis given increased attenuation. Electronically Signed   By: Lovena Le M.D.   On: 03/30/2019 03:38   Dg Knee Complete 4 Views Left  Result Date: 03/30/2019 CLINICAL DATA:  Fall, left femur fracture EXAM: LEFT KNEE - COMPLETE 4+ VIEW COMPARISON:  None FINDINGS: The osseous structures appear diffusely demineralized which may limit detection of small or nondisplaced fractures. Included portions of the distal femur and proximal tibia and fibula are intact. There is medial compartmental narrowing with subchondral sclerotic changes. Additional milder degenerative changes are noted in the lateral femorotibial and patellofemoral compartments. Trace effusion is present with mild distal quadriceps tendon thickening. Minimal soft tissue swelling is seen anteriorly. Vascular calcium is noted posterior to the knee. IMPRESSION: 1. Diffuse bone demineralization. No definite fracture or traumatic malalignment. 2. Trace effusion and mild thickening of  the distal quadriceps tendon. 3. Mild to moderate tricompartmental degenerative changes, worst in the medial compartment. Electronically Signed   By: Lovena Le M.D.   On: 03/30/2019 02:41   Dg Swallowing Func-speech Pathology  Result Date: 04/02/2019 Objective Swallowing Evaluation: Type of Study: MBS-Modified Barium Swallow Study  Patient Details Name: Aaron Frye MRN: [id number] Date of Birth: [birth year] Today's Date: 04/02/2019 Time: SLP Start Time (ACUTE ONLY): 1000 -SLP Stop Time (ACUTE ONLY): 1025 SLP Time Calculation (min) (ACUTE ONLY): 25 min Past Medical History: Past Medical History: Diagnosis Date  Atrial fibrillation ([place])   Persisent   CVA (cerebral vascular accident) ([place])   Femur fracture, right ([place]) March 2016  HLD (hyperlipidemia)   HTN (hypertension)   Mitral stenosis   Non-ischemic cardiomyopathy ([place])   LVEF=35-40% by echo 2009  Prostate cancer ([place])   TIA (transient ischemic attack)  Past Surgical History: Past Surgical History: Procedure Laterality Date  APPENDECTOMY    INTRAMEDULLARY (IM) NAIL INTERTROCHANTERIC Right 09/24/2014  Procedure:  INTRAMEDULLARY (IM) NAIL INTERTROCHANTRIC;  Surgeon: Rod Can, MD;  Location: [facility];  Service: Orthopedics;  Laterality: Right;  TONSILLECTOMY   HPI: 85 y.o. male with medical history significant for atrial fibrillation on warfarin, history of CVA, hypertension, chronic diastolic CHF, COPD and restrictive lung disease with chronic hypoxic respiratory failure, pulmonary hypertension, and severe mitral stenosis, now presenting to the emergency department with severe left hip pain after a fall at home.  Patient had been in his usual state of health and was having an uneventful day when he went to sit in a chair, missed the chair, landed on the floor, did not hit his head or lose consciousness, but began to experience severe pain at the left hip. x-rays showed a left intertroch fx and orthopedic surgery was consulted.  Subjective: Pt  was alert and cooperative Assessment / Plan / Recommendation CHL IP CLINICAL IMPRESSIONS 04/02/2019 Clinical Impression Aaron Frye is a 85 year old male who presents with moderately severe oropharyngeal dysphagia with resultant silent aspiration of thin liquid and nectar-thick liquid on today's examination.  Pt additionally exhibited laryngeal penetration of honey-thick liquid via straw sip.  Pt's dysphagia is secondary to generalized oropharyngeal weakness and reduced laryngeal closure.  Chin tuck maneuver was unsuccessful in eliminating aspiration events.  Pt exhibited moderate pharyngeal residue throughout this evaluation which he was unable to clear with cued cough and that he intermittently penetrated during all trials.  Oral phase was remarkable for reduced lingual strength resulting in oral residue with all consistencies.  Pharyngeal phase was remarkable for reduced hyolaryngeal elevation/excursion resulting in vallecular and pyriform residue, reduced BOT retraction resulting in vallecular residue, and reduced laryngeal closure resulting in aspiration before the swallow.  Pt with reduced laryngeal sensation evidenced by no attempt to clear aspirated material with a spontaneous cough.  Pharyngoesophageal phase was remarkable for a suspected CP bar at the level of C4.  Recommend Dysphagia 2 (ground) solids and honey-thick liquids (no straws); however, pt is at an increased risk for aspiration with all consistencies secondary to reduced laryngeal closure and pharyngeal residue.  Due to full code status and moderate-severe aspiration risk, pt is not appropriate for the [employer] Protocol at this time.  SLP Visit Diagnosis Dysphagia, oropharyngeal phase (R13.12) Attention and concentration deficit following -- Frontal lobe and executive function deficit following -- Impact on safety and function Other (comment)   CHL IP TREATMENT RECOMMENDATION 04/02/2019 Treatment Recommendations Therapy as outlined  in treatment plan below   Prognosis 04/02/2019 Prognosis for Safe Diet Advancement Guarded Barriers to Reach Goals Severity of deficits Barriers/Prognosis Comment -- CHL IP DIET RECOMMENDATION 04/02/2019 SLP Diet Recommendations Dysphagia 2 (Fine chop) solids;Honey thick liquids Liquid Administration via Cup Medication Administration Crushed with puree Compensations Slow rate;Small sips/bites Postural Changes Remain semi-upright after after feeds/meals (Comment);Seated upright at 90 degrees   CHL IP OTHER RECOMMENDATIONS 04/02/2019 Recommended Consults -- Oral Care Recommendations Oral care BID Other Recommendations Order thickener from pharmacy   CH[facility] IP FOLLOW UP RECOMMENDATIONS 04/02/2019 Follow up Recommendations 24 hour supervision/assistance;Skilled Nursing facility   [facility] IP FREQUENCY AND DURATION 04/02/2019 Speech Therapy Frequency (ACUTE ONLY) min 2x/week Treatment Duration 2 weeks      CHL IP ORAL PHASE 04/02/2019 Oral Phase Impaired Oral - Pudding Teaspoon -- Oral - Pudding Cup -- Oral - Honey Teaspoon Delayed oral transit;Weak lingual manipulation;Premature spillage Oral - Honey Cup Delayed oral transit;Weak lingual manipulation;Premature spillage Oral - Nectar Teaspoon Weak lingual manipulation;Delayed oral transit;Premature spillage Oral - Nectar Cup  Delayed oral transit;Weak lingual manipulation;Premature spillage Oral - Nectar Straw Delayed oral transit;Weak lingual manipulation;Premature spillage Oral - Thin Teaspoon Weak lingual manipulation;Delayed oral transit;Premature spillage Oral - Thin Cup Delayed oral transit;Weak lingual manipulation;Premature spillage Oral - Thin Straw Delayed oral transit;Weak lingual manipulation;Premature spillage Oral - Puree Weak lingual manipulation;Delayed oral transit Oral - Mech Soft -- Oral - Regular Impaired mastication;Weak lingual manipulation;Delayed oral transit Oral - Multi-Consistency -- Oral - Pill -- Oral Phase - Comment --  CHL IP PHARYNGEAL PHASE 04/02/2019  Pharyngeal Phase Impaired Pharyngeal- Pudding Teaspoon -- Pharyngeal -- Pharyngeal- Pudding Cup -- Pharyngeal -- Pharyngeal- Honey Teaspoon Delayed swallow initiation-vallecula;Reduced laryngeal elevation;Reduced anterior laryngeal mobility;Reduced tongue base retraction;Pharyngeal residue - pyriform;Pharyngeal residue - valleculae Pharyngeal Material does not enter airway Pharyngeal- Honey Cup Reduced anterior laryngeal mobility;Reduced laryngeal elevation;Reduced tongue base retraction;Delayed swallow initiation-vallecula;Pharyngeal residue - valleculae;Pharyngeal residue - pyriform Pharyngeal Material does not enter airway Pharyngeal- Nectar Teaspoon Delayed swallow initiation-vallecula;Reduced anterior laryngeal mobility;Reduced laryngeal elevation;Reduced airway/laryngeal closure;Pharyngeal residue - pyriform;Pharyngeal residue - valleculae;Reduced tongue base retraction;Penetration/Aspiration before swallow Pharyngeal Material enters airway, remains ABOVE vocal cords and not ejected out Pharyngeal- Nectar Cup Moderate aspiration;Delayed swallow initiation-vallecula;Reduced anterior laryngeal mobility;Reduced laryngeal elevation;Reduced airway/laryngeal closure;Reduced tongue base retraction;Penetration/Aspiration before swallow;Pharyngeal residue - valleculae;Pharyngeal residue - pyriform Pharyngeal Material enters airway, passes BELOW cords without attempt by patient to eject out (silent aspiration) Pharyngeal- Nectar Straw Delayed swallow initiation-vallecula;Reduced anterior laryngeal mobility;Reduced laryngeal elevation;Reduced airway/laryngeal closure;Reduced tongue base retraction;Penetration/Aspiration before swallow;Pharyngeal residue - valleculae;Pharyngeal residue - pyriform Pharyngeal Material enters airway, remains ABOVE vocal cords and not ejected out Pharyngeal- Thin Teaspoon Delayed swallow initiation-pyriform sinuses;Reduced anterior laryngeal mobility;Reduced laryngeal elevation;Reduced  airway/laryngeal closure;Reduced tongue base retraction;Penetration/Aspiration before swallow;Pharyngeal residue - valleculae;Pharyngeal residue - pyriform Pharyngeal Material enters airway, remains ABOVE vocal cords and not ejected out Pharyngeal- Thin Cup Delayed swallow initiation-pyriform sinuses;Reduced anterior laryngeal mobility;Reduced laryngeal elevation;Reduced airway/laryngeal closure;Reduced tongue base retraction;Penetration/Aspiration before swallow;Moderate aspiration;Pharyngeal residue - valleculae;Pharyngeal residue - pyriform Pharyngeal Material enters airway, passes BELOW cords without attempt by patient to eject out (silent aspiration) Pharyngeal- Thin Straw Delayed swallow initiation-pyriform sinuses;Reduced anterior laryngeal mobility;Reduced laryngeal elevation;Reduced airway/laryngeal closure;Penetration/Aspiration before swallow;Pharyngeal residue - valleculae;Pharyngeal residue - pyriform;Moderate aspiration Pharyngeal Material enters airway, passes BELOW cords without attempt by patient to eject out (silent aspiration) Pharyngeal- Puree Delayed swallow initiation-vallecula;Reduced anterior laryngeal mobility;Reduced laryngeal elevation;Reduced tongue base retraction;Pharyngeal residue - valleculae;Pharyngeal residue - pyriform Pharyngeal -- Pharyngeal- Mechanical Soft -- Pharyngeal -- Pharyngeal- Regular Delayed swallow initiation-vallecula;Reduced laryngeal elevation;Reduced airway/laryngeal closure;Reduced tongue base retraction;Pharyngeal residue - valleculae;Pharyngeal residue - pyriform Pharyngeal -- Pharyngeal- Multi-consistency -- Pharyngeal -- Pharyngeal- Pill (No Data) Pharyngeal -- Pharyngeal Comment --  CHL IP CERVICAL ESOPHAGEAL PHASE 04/02/2019 Cervical Esophageal Phase Impaired Pudding Teaspoon -- Pudding Cup -- Honey Teaspoon -- Honey Cup -- Nectar Teaspoon -- Nectar Cup -- Nectar Straw -- Thin Teaspoon -- Thin Cup -- Thin Straw -- Puree -- Mechanical Soft -- Regular --  Multi-consistency -- Pill -- Cervical Esophageal Comment Suspected CP bar at the level of C4  Bretta Bang, M.S., [place] Office: [phone] [facility] 04/02/2019, 12:35 PM              Dg C-arm 1-60 Min  Result Date: 04/01/2019 CLINICAL DATA:  Known left intratrochanteric fracture EXAM: LEFT FEMUR 2 VIEWS; DG C-ARM 1-60 MIN COMPARISON:  03/30/2019 FLUOROSCOPY TIME:  Fluoroscopy Time:  41 seconds Radiation Exposure Index (if provided by the fluoroscopic device): Not available Number of Acquired Spot Images: 3 FINDINGS: Proximal medullary rod with fixation  screw traversing the femoral neck is seen. The fracture fragments are in near anatomic alignment. IMPRESSION: Status post ORIF of left femoral fracture. Electronically Signed   By: Inez Catalina M.D.   On: 04/01/2019 21:58   Dg Hip Unilat With Pelvis 2-3 Views Left  Result Date: 03/30/2019 CLINICAL DATA:  Hip fracture, fall EXAM: DG HIP (WITH OR WITHOUT PELVIS) 2-3V LEFT COMPARISON:  Radiograph 09/23/2014 FINDINGS: Bones are diffusely osteopenic which may limit detection of subtle, nondisplaced fractures. The bones of the pelvis remain congruent without diastasis or abnormal widening. The femoral heads remain normally located. There is a comminuted, impacted and slightly valgus angulated intertrochanteric left femur fracture. There is prior right intramedullary nail and transcervical fixation screw placement. Vascular calcium noted in the medial thighs. Bowel gas pattern is unremarkable. Surgical clips are present in the low pelvis. IMPRESSION: 1. Comminuted, impacted and slightly valgus angulated intertrochanteric left femur fracture. 2. Bones are diffusely osteopenic which may limit detection of additional more subtle, nondisplaced fractures. Electronically Signed   By: Lovena Le M.D.   On: 03/30/2019 02:40   Dg Femur Min 2 Views Left  Result Date: 04/01/2019 CLINICAL DATA:  Known left intratrochanteric fracture  EXAM: LEFT FEMUR 2 VIEWS; DG C-ARM 1-60 MIN COMPARISON:  03/30/2019 FLUOROSCOPY TIME:  Fluoroscopy Time:  41 seconds Radiation Exposure Index (if provided by the fluoroscopic device): Not available Number of Acquired Spot Images: 3 FINDINGS: Proximal medullary rod with fixation screw traversing the femoral neck is seen. The fracture fragments are in near anatomic alignment. IMPRESSION: Status post ORIF of left femoral fracture. Electronically Signed   By: Inez Catalina M.D.   On: 04/01/2019 21:58       Subjective: Pain controlled.  Discharge Exam: Vitals:   04/04/19 0435 04/04/19 0751  BP: (!) 99/54 118/63  Pulse: 70 84  Resp: 15 16  Temp: 98.1 F (36.7 C) 97.7 F (36.5 C)  SpO2: 98% 98%   Vitals:   04/03/19 1603 04/03/19 2024 04/04/19 0435 04/04/19 0751  BP: 103/60 (!) 106/55 (!) 99/54 118/63  Pulse: 76 75 70 84  Resp: 18 13 15 16   Temp: 98.2 F (36.8 C) 98.8 F (37.1 C) 98.1 F (36.7 C) 97.7 F (36.5 C)  TempSrc: Oral Oral  Oral  SpO2: 100% 96% 98% 98%  Weight:      Height:        General: Pt is alert, awake, not in acute distress Cardiovascular: RRR, S1/S2 +, no rubs, no gallops Respiratory: CTA bilaterally, no wheezing, no rhonchi Abdominal: Soft, NT, ND, bowel sounds + Extremities: no edema, no cyanosis    The results of significant diagnostics from this hospitalization (including imaging, microbiology, ancillary and laboratory) are listed below for reference.     Microbiology: Recent Results (from the past 240 hour(s))  SARS Coronavirus 2 [facility] order, Performed in [facility] hospital lab) Nasopharyngeal Nasopharyngeal Swab     Status: None   Collection Time: 03/30/19  2:50 AM   Specimen: Nasopharyngeal Swab  Result Value Ref Range Status   SARS Coronavirus 2 NEGATIVE NEGATIVE Final    Comment: (NOTE) If result is NEGATIVE SARS-CoV-2 target nucleic acids are NOT DETECTED. The SARS-CoV-2 RNA is generally detectable in upper and lower  respiratory  specimens during the acute phase of infection. The lowest  concentration of SARS-CoV-2 viral copies this assay can detect is 250  copies / mL. A negative result does not preclude SARS-CoV-2 infection  and should not be used as the sole basis  for treatment or other  patient management decisions.  A negative result may occur with  improper specimen collection / handling, submission of specimen other  than nasopharyngeal swab, presence of viral mutation(s) within the  areas targeted by this assay, and inadequate number of viral copies  (<250 copies / mL). A negative result must be combined with clinical  observations, patient history, and epidemiological information. If result is POSITIVE SARS-CoV-2 target nucleic acids are DETECTED. The SARS-CoV-2 RNA is generally detectable in upper and lower  respiratory specimens dur ing the acute phase of infection.  Positive  results are indicative of active infection with SARS-CoV-2.  Clinical  correlation with patient history and other diagnostic information is  necessary to determine patient infection status.  Positive results do  not rule out bacterial infection or co-infection with other viruses. If result is PRESUMPTIVE POSTIVE SARS-CoV-2 nucleic acids MAY BE PRESENT.   A presumptive positive result was obtained on the submitted specimen  and confirmed on repeat testing.  While 2019 novel coronavirus  (SARS-CoV-2) nucleic acids may be present in the submitted sample  additional confirmatory testing may be necessary for epidemiological  and / or clinical management purposes  to differentiate between  SARS-CoV-2 and other Sarbecovirus currently known to infect humans.  If clinically indicated additional testing with an alternate test  methodology [phone]) is advised. The SARS-CoV-2 RNA is generally  detectable in upper and lower respiratory sp ecimens during the acute  phase of infection. The expected result is Negative. Fact Sheet for  Patients:  StrictlyIdeas.no Fact Sheet for Healthcare Providers: BankingDealers.co.za This test is not yet approved or cleared by the [place] FDA and has been authorized for detection and/or diagnosis of SARS-CoV-2 by FDA under an Emergency Use Authorization (EUA).  This EUA will remain in effect (meaning this test can be used) for the duration of the COVID-19 declaration under Section 564(b)(1) of the Act, 21 U.S.C. section 360bbb-3(b)(1), unless the authorization is terminated or revoked sooner. Performed at [facility], [place] [address] [place], [place] [address]   Surgical PCR screen     Status: Abnormal   Collection Time: 03/30/19  4:55 PM   Specimen: Nasal Mucosa; Nasal Swab  Result Value Ref Range Status   MRSA, PCR NEGATIVE NEGATIVE Final   Staphylococcus aureus POSITIVE (A) NEGATIVE Final    Comment: (NOTE) The Xpert SA Assay (FDA approved for NASAL specimens in patients 44 years of age and older), is one component of a comprehensive surveillance program. It is not intended to diagnose infection nor to guide or monitor treatment. Performed at [facility], [place] [address], [place], [place] [address]      Labs: BNP (last 3 results) No results for input(s): BNP in the last 8760 hours. Basic Metabolic Panel: Recent Labs  Lab 03/31/19 0453 04/02/19 0859 04/03/19 0316 04/03/19 1844 04/04/19 1102  NA 139 138 136 137 141  K 4.2 5.1 4.9 4.4 4.5  CL 99 99 95* 99 103  CO2 29 28 28 26 28   GLUCOSE 136* 217* 224* 147* 125*  BUN 34* 45* 71* 75* 66*  CREATININE 0.98 1.10 1.57* 1.43* 1.13  CALCIUM 8.7* 8.4* 8.6* 8.5* 8.6*  MG 2.4  --   --   --   --   PHOS 3.3  --   --   --   --    Liver Function Tests: Recent Labs  Lab 03/30/19 0205 03/31/19 0453  AST 31  --   ALT  30  --   ALKPHOS 123  --   BILITOT 1.0  --   PROT 8.3*  --   ALBUMIN 3.7 2.7*   No results for input(s): LIPASE, AMYLASE in the last 168  hours. No results for input(s): AMMONIA in the last 168 hours. CBC: Recent Labs  Lab 03/30/19 0205 03/31/19 0453 04/02/19 0859 04/03/19 0316 04/04/19 0725  WBC 13.6* 14.9* 14.5* 20.1* 13.5*  NEUTROABS 11.4* 11.6*  --   --   --   HGB 13.3 10.8* 10.5* 10.4* 9.9*  HCT 42.1 33.8* 33.8* 32.3* 31.0*  MCV 85.4 84.1 85.4 83.2 84.9  PLT 211 157 149* 162 165   Cardiac Enzymes: No results for input(s): CKTOTAL, CKMB, CKMBINDEX, TROPONINI in the last 168 hours. BNP: Invalid input(s): POCBNP CBG: Recent Labs  Lab 04/03/19 2022 04/03/19 2359 04/04/19 0433 04/04/19 0749 04/04/19 1148  GLUCAP 139* 127* 101* 115* 110*   D-Dimer No results for input(s): DDIMER in the last 72 hours. Hgb A1c No results for input(s): HGBA1C in the last 72 hours. Lipid Profile No results for input(s): CHOL, HDL, LDLCALC, TRIG, CHOLHDL, LDLDIRECT in the last 72 hours. Thyroid function studies No results for input(s): TSH, T4TOTAL, T3FREE, THYROIDAB in the last 72 hours.  Invalid input(s): FREET3 Anemia work up No results for input(s): VITAMINB12, FOLATE, FERRITIN, TIBC, IRON, RETICCTPCT in the last 72 hours. Urinalysis    Component Value Date/Time   COLORURINE YELLOW 10/21/2017 0954   APPEARANCEUR TURBID (A) 10/21/2017 0954   LABSPEC 1.012 10/21/2017 0954   PHURINE 6.0 10/21/2017 0954   GLUCOSEU NEGATIVE 10/21/2017 0954   HGBUR MODERATE (A) 10/21/2017 0954   BILIRUBINUR NEGATIVE 10/21/2017 0954   KETONESUR NEGATIVE 10/21/2017 0954   PROTEINUR 30 (A) 10/21/2017 0954   UROBILINOGEN 0.2 10/02/2014 0301   NITRITE NEGATIVE 10/21/2017 0954   LEUKOCYTESUR LARGE (A) 10/21/2017 0954   Sepsis Labs Invalid input(s): PROCALCITONIN,  WBC,  LACTICIDVEN Microbiology Recent Results (from the past 240 hour(s))  SARS Coronavirus 2 [facility] order, Performed in [facility] hospital lab) Nasopharyngeal Nasopharyngeal Swab     Status: None   Collection Time: 03/30/19  2:50 AM   Specimen: Nasopharyngeal Swab    Result Value Ref Range Status   SARS Coronavirus 2 NEGATIVE NEGATIVE Final    Comment: (NOTE) If result is NEGATIVE SARS-CoV-2 target nucleic acids are NOT DETECTED. The SARS-CoV-2 RNA is generally detectable in upper and lower  respiratory specimens during the acute phase of infection. The lowest  concentration of SARS-CoV-2 viral copies this assay can detect is 250  copies / mL. A negative result does not preclude SARS-CoV-2 infection  and should not be used as the sole basis for treatment or other  patient management decisions.  A negative result may occur with  improper specimen collection / handling, submission of specimen other  than nasopharyngeal swab, presence of viral mutation(s) within the  areas targeted by this assay, and inadequate number of viral copies  (<250 copies / mL). A negative result must be combined with clinical  observations, patient history, and epidemiological information. If result is POSITIVE SARS-CoV-2 target nucleic acids are DETECTED. The SARS-CoV-2 RNA is generally detectable in upper and lower  respiratory specimens dur ing the acute phase of infection.  Positive  results are indicative of active infection with SARS-CoV-2.  Clinical  correlation with patient history and other diagnostic information is  necessary to determine patient infection status.  Positive results do  not rule out bacterial infection or co-infection with other  viruses. If result is PRESUMPTIVE POSTIVE SARS-CoV-2 nucleic acids MAY BE PRESENT.   A presumptive positive result was obtained on the submitted specimen  and confirmed on repeat testing.  While 2019 novel coronavirus  (SARS-CoV-2) nucleic acids may be present in the submitted sample  additional confirmatory testing may be necessary for epidemiological  and / or clinical management purposes  to differentiate between  SARS-CoV-2 and other Sarbecovirus currently known to infect humans.  If clinically indicated additional  testing with an alternate test  methodology [phone]) is advised. The SARS-CoV-2 RNA is generally  detectable in upper and lower respiratory sp ecimens during the acute  phase of infection. The expected result is Negative. Fact Sheet for Patients:  StrictlyIdeas.no Fact Sheet for Healthcare Providers: BankingDealers.co.za This test is not yet approved or cleared by the [place] FDA and has been authorized for detection and/or diagnosis of SARS-CoV-2 by FDA under an Emergency Use Authorization (EUA).  This EUA will remain in effect (meaning this test can be used) for the duration of the COVID-19 declaration under Section 564(b)(1) of the Act, 21 U.S.C. section 360bbb-3(b)(1), unless the authorization is terminated or revoked sooner. Performed at [facility], [place] [address], [place], [place] [address]   Surgical PCR screen     Status: Abnormal   Collection Time: 03/30/19  4:55 PM   Specimen: Nasal Mucosa; Nasal Swab  Result Value Ref Range Status   MRSA, PCR NEGATIVE NEGATIVE Final   Staphylococcus aureus POSITIVE (A) NEGATIVE Final    Comment: (NOTE) The Xpert SA Assay (FDA approved for NASAL specimens in patients 32 years of age and older), is one component of a comprehensive surveillance program. It is not intended to diagnose infection nor to guide or monitor treatment. Performed at [facility], [place] [address] [place], [place] [address]      Time coordinating discharge: 35 minutes  SIGNED:   Cordelia Poche, MD [facility] 04/04/2019, 2:21 PM

## 2019-04-04 NOTE — Evaluation (Signed)
Occupational Therapy Evaluation Patient Details Name: Aaron Frye MRN: [id number] DOB: Feb 05, 1925 Today's Date: 04/04/2019    History of Present Illness Pt is a 85 y.o. male admitted 03/30/19 after fall at home sustaining L intertrochanteric hip fx. S/p L femur IMN 9/30. PMH includes afib, CVA, R femur fx s/p IMN (08/2014), HTN, cardiomyopathy.   Clinical Impression   Patient is s/p L IMN surgery resulting in functional limitations due to the deficits listed below (see OT problem list). Pt currently mod (A) for transfers with high risk for fallign due to scissoring. Pt with decrease ability to lift R LE.Daughter with numerous questions and all questions answered this session in detail.  Daughter and granddaughter will be care providers until daughter can hire 24/7 (A) for home. Patient will benefit from skilled OT acutely to increase independence and safety with ADLS to allow discharge [place] with aide .     Follow Up Recommendations  Home health OT;Other (comment)(aide)    Equipment Recommendations  3 in 1 bedside commode;Wheelchair (measurements OT);Wheelchair cushion (measurements OT);Other (comment)(RW)    Recommendations for Other Services       Precautions / Restrictions Precautions Precautions: Fall Precaution Comments: requires oxygen Restrictions Weight Bearing Restrictions: Yes LLE Weight Bearing: Weight bearing as tolerated      Mobility Bed Mobility Overal bed mobility: Needs Assistance Bed Mobility: Supine to Sit     Supine to sit: Max assist     General bed mobility comments: progressed to Right side of bed and needed increase time for balance at EOB  Transfers Overall transfer level: Needs assistance Equipment used: Rolling walker (2 wheeled) Transfers: Sit to/from Stand Sit to Stand: Mod assist         General transfer comment: pt with scissor and decrease advance of R LE. pt moving L LE better than R this session.     Balance Overall balance  assessment: Needs assistance   Sitting balance-Leahy Scale: Poor Sitting balance - Comments: right lateral lean     Standing balance-Leahy Scale: Poor Standing balance comment: reliant on RW and help with sequence to avoid scissor gait. educated daughter on feet position prior to movement                           ADL either performed or assessed with clinical judgement   ADL Overall ADL's : Needs assistance/impaired Eating/Feeding: Set up;Sitting Eating/Feeding Details (indicate cue type and reason): coughing on regular water. daughter says he wont drink that stuff says it feels like sand. daughter states but he drinks the ones premade but they dont have them up here. OT calling and ordering pre mixed drinks for patient to decrease risk of aspiration Grooming: Wash/dry hands;Set up;Sitting   Upper Body Bathing: Maximal assistance   Lower Body Bathing: Maximal assistance   Upper Body Dressing : Maximal assistance   Lower Body Dressing: Maximal assistance   Toilet Transfer: Moderate assistance;RW;BSC Toilet Transfer Details (indicate cue type and reason): transfers better to L than Right. Educated daughter on transfer with w/c and bsc placement. pt is able to scoot foward on [facility] for peri care in sitting Toileting- Clothing Manipulation and Hygiene: Total assistance       Functional mobility during ADLs: Moderate assistance;Rolling walker General ADL Comments: pt transfer from bed to chair and chair to bsc then back to chair. Daughter assisting during session for education . all daughters questions answered and phone number to department provided for future questions  Vision         Perception     Praxis      Pertinent Vitals/Pain Pain Assessment: Faces Faces Pain Scale: Hurts little more Pain Location: LLE with mobility Pain Descriptors / Indicators: Discomfort;Grimacing Pain Intervention(s): Monitored during session;Premedicated before  session;Repositioned     Hand Dominance Right   Extremity/Trunk Assessment Upper Extremity Assessment Upper Extremity Assessment: Overall WFL for tasks assessed(forward posture rounded shoulders)   Lower Extremity Assessment Lower Extremity Assessment: Defer to PT evaluation   Cervical / Trunk Assessment Cervical / Trunk Assessment: Other exceptions Cervical / Trunk Exceptions: Baseline significant cervical flexion, rounded shoulders   Communication Communication Communication: HOH   Cognition Arousal/Alertness: Awake/alert Behavior During Therapy: Flat affect Overall Cognitive Status: Within Functional Limits for tasks assessed                                 General Comments: WFL for simple tasks; HOH   General Comments  educated on ice to leg when sitting. educate don use of linen to build up chair surface and flat sheet to act as a pad to help pull him toward the front of chair as needed    Exercises     Shoulder Instructions      Home Living Family/patient expects to be discharged to:: Private residence Living Arrangements: Spouse/significant other Available Help at Discharge: Family;Available PRN/intermittently Type of Home: House Home Access: Ramped entrance     Home Layout: One level     Bathroom Shower/Tub: Occupational psychologist: Standard Bathroom Accessibility: Yes How Accessible: Accessible via walker Home Equipment: [place] - 2 wheels;Shower seat   Additional Comments: Wife with early stages dementia, unable to provide assist. Daughter planning to arrange 24/7 assist by caregivers. Daughter to get [facility] granddaughter also to (A)      Prior Functioning/Environment Level of Independence: Independent with assistive device(s)        Comments: Ambulatory with RW; daughter verifies this is the first fall recently. Ascends steps to bedroom independently. Pt values his independence, enjoys sitting and reading        OT Problem  List: Decreased strength;Decreased activity tolerance;Impaired balance (sitting and/or standing);Decreased safety awareness;Decreased knowledge of use of DME or AE;Decreased knowledge of precautions;Obesity;Pain      OT Treatment/Interventions: Self-care/ADL training;Therapeutic exercise;Neuromuscular education;Energy conservation;DME and/or AE instruction;Manual therapy;Modalities;Therapeutic activities;Patient/family education;Balance training    OT Goals(Current goals can be found in the care plan section) Acute Rehab OT Goals Patient Stated Goal: Return home with 24/7 assist OT Goal Formulation: With patient Time For Goal Achievement: 04/18/19 Potential to Achieve Goals: Good  OT Frequency: Min 2X/week   Barriers to D/C:    wife with dementia and pending caregivers hired for in home care       Co-evaluation              AM-PAC OT "6 Clicks" Daily Activity     Outcome Measure Help from another person eating meals?: A Little Help from another person taking care of personal grooming?: A Lot Help from another person toileting, which includes using toliet, bedpan, or urinal?: A Lot Help from another person bathing (including washing, rinsing, drying)?: Total Help from another person to put on and taking off regular upper body clothing?: A Lot Help from another person to put on and taking off regular lower body clothing?: Total 6 Click Score: 11   End of Session Equipment Utilized During  Treatment: Gait belt;Rolling walker Nurse Communication: Mobility status;Precautions;Weight bearing status  Activity Tolerance: Patient tolerated treatment well Patient left: in chair;with call bell/phone within reach;with chair alarm set;with family/visitor present  OT Visit Diagnosis: Unsteadiness on feet (R26.81);Muscle weakness (generalized) (M62.81);Pain Pain - Right/Left: Left Pain - part of body: Leg                Time: 1127-1215 OT Time Calculation (min): 48 min Charges:  OT  General Charges $OT Visit: 1 Visit OT Evaluation $OT Eval Moderate Complexity: 1 Mod OT Treatments $Self Care/Home Management : 23-37 mins   Jeri Modena, OTR/L  Acute Rehabilitation Services Pager: [phone] Office: [phone] .   Jeri Modena 04/04/2019, 12:51 PM

## 2019-04-04 NOTE — Progress Notes (Addendum)
ANTICOAGULATION CONSULT NOTE -Follow up  Pharmacy Consult for Warfarin  Indication: history of Afib and CVA  Allergies  Allergen Reactions  . Penicillins Rash and Other (See Comments)    Has patient had a PCN reaction causing immediate rash, facial/tongue/throat swelling, SOB or lightheadedness with hypotension: Yes Has patient had a PCN reaction causing severe rash involving mucus membranes or skin necrosis: Unk Has patient had a PCN reaction that required hospitalization: No Has patient had a PCN reaction occurring within the last 10 years: No If all of the above answers are "NO", then may proceed with Cephalosporin use.     Patient Measurements: Height: 5\' 7"  (170.2 cm) Weight: 141 lb 8.6 oz (64.2 kg) IBW/kg (Calculated) : 66.1   Vital Signs: Temp: 97.7 F (36.5 C) (10/03 0751) Temp Source: Oral (10/03 0751) BP: 118/63 (10/03 0751) Pulse Rate: 84 (10/03 0751)  Labs: Recent Labs    04/02/19 0859 04/03/19 0316 04/03/19 1844 04/04/19 0725  HGB 10.5* 10.4*  --  9.9*  HCT 33.8* 32.3*  --  31.0*  PLT 149* 162  --  165  LABPROT 16.2* 17.5*  --  18.2*  INR 1.3* 1.5*  --  1.5*  CREATININE 1.10 1.57* 1.43*  --     Estimated Creatinine Clearance: 29.9 mL/min (A) (by C-G formula based on SCr of 1.43 mg/dL (H)).   Medical History: Past Medical History:  Diagnosis Date  . Atrial fibrillation ([place])    Persisent   . CVA (cerebral vascular accident) ([place])   . Femur fracture, right [facility]) March 2016  . HLD (hyperlipidemia)   . HTN (hypertension)   . Mitral stenosis   . Non-ischemic cardiomyopathy ([place])    LVEF=35-40% by echo 2009  . Prostate cancer ([place])   . TIA (transient ischemic attack)     Assessment: 3 yoM admitted with left hip fracture s/p fall at home. On warfarin 5 mg daily PTA for AFib. Warfarin was reversed with vit K 5 mg on 9/28 and 2 mg on 9/29 for surgery on 9/30. Warfarin was resumed post-op on 10/1 along with enox 40 mg sq daily per MD. Pharmacy  consulted to dose warfarin.  INR today is subtherapeutic at 1.5. CBC low but stable, plt wnl. SCr jumped up yesterday to 1.57, repeat lab in PM lowered to 1.43. No creatinine ordered for today.  Goal of Therapy:  INR = 2 - 3 Monitor platelets by anticoagulation protocol: Yes   Plan:  Increase warfarin to 7.5 mg PO x 1 today MD continues Enoxaparin 40 mg sq daily Daily INR and s/sx of bleeding  Berenice Bouton, PharmD PGY1 Pharmacy Resident Office phone: [phone] 04/04/2019,9:44 AM

## 2019-04-07 ENCOUNTER — Other Ambulatory Visit (HOSPITAL_COMMUNITY): Payer: Self-pay

## 2019-04-07 MED ORDER — POTASSIUM CHLORIDE CRYS ER 20 MEQ PO TBCR: 40.0000 meq | EXTENDED_RELEASE_TABLET | Freq: Two times a day (BID) | ORAL | 3 refills | Status: DC

## 2019-04-08 ENCOUNTER — Encounter (INDEPENDENT_AMBULATORY_CARE_PROVIDER_SITE_OTHER): Payer: Medicare Other | Admitting: Ophthalmology

## 2019-04-13 ENCOUNTER — Emergency Department (HOSPITAL_COMMUNITY): Payer: Medicare Other

## 2019-04-13 ENCOUNTER — Emergency Department (HOSPITAL_COMMUNITY)
Admission: EM | Admit: 2019-04-13 | Discharge: 2019-04-14 | Disposition: A | Discharge status: Home / Self Care | Payer: Medicare Other | Attending: Emergency Medicine | Admitting: Emergency Medicine

## 2019-04-13 ENCOUNTER — Encounter (HOSPITAL_COMMUNITY): Payer: Self-pay

## 2019-04-13 DIAGNOSIS — J449 Chronic obstructive pulmonary disease, unspecified: Secondary | ICD-10-CM | POA: Diagnosis not present

## 2019-04-13 DIAGNOSIS — Z79899 Other long term (current) drug therapy: Secondary | ICD-10-CM | POA: Insufficient documentation

## 2019-04-13 DIAGNOSIS — Z7901 Long term (current) use of anticoagulants: Secondary | ICD-10-CM | POA: Diagnosis not present

## 2019-04-13 DIAGNOSIS — R899 Unspecified abnormal finding in specimens from other organs, systems and tissues: Secondary | ICD-10-CM

## 2019-04-13 DIAGNOSIS — R7989 Other specified abnormal findings of blood chemistry: Secondary | ICD-10-CM | POA: Diagnosis present

## 2019-04-13 DIAGNOSIS — Z8673 Personal history of transient ischemic attack (TIA), and cerebral infarction without residual deficits: Secondary | ICD-10-CM | POA: Diagnosis not present

## 2019-04-13 DIAGNOSIS — I10 Essential (primary) hypertension: Secondary | ICD-10-CM | POA: Insufficient documentation

## 2019-04-13 LAB — COMPREHENSIVE METABOLIC PANEL
ALT: 34 U/L (ref 0–44)
AST: 39 U/L (ref 15–41)
Albumin: 2.6 g/dL — ABNORMAL LOW (ref 3.5–5.0)
Alkaline Phosphatase: 136 U/L — ABNORMAL HIGH (ref 38–126)
Anion gap: 12 (ref 5–15)
BUN: 48 mg/dL — ABNORMAL HIGH (ref 8–23)
CO2: 26 mmol/L (ref 22–32)
Calcium: 8.8 mg/dL — ABNORMAL LOW (ref 8.9–10.3)
Chloride: 101 mmol/L (ref 98–111)
Creatinine, Ser: 1.15 mg/dL (ref 0.61–1.24)
GFR calc Af Amer: >60 mL/min (ref >60)
GFR calc non Af Amer: 55 mL/min — ABNORMAL LOW (ref >60)
Glucose, Bld: 151 mg/dL — ABNORMAL HIGH (ref 70–99)
Potassium: 4.6 mmol/L (ref 3.5–5.1)
Sodium: 139 mmol/L (ref 135–145)
Total Bilirubin: 1.3 mg/dL — ABNORMAL HIGH (ref 0.3–1.2)
Total Protein: 6.8 g/dL (ref 6.5–8.1)

## 2019-04-13 LAB — CBC WITH DIFFERENTIAL/PLATELET
Abs Immature Granulocytes: 0.13 10*3/uL — ABNORMAL HIGH (ref 0.00–0.07)
Basophils Absolute: 0.1 10*3/uL (ref 0.0–0.1)
Basophils Relative: 0 %
Eosinophils Absolute: 0.2 10*3/uL (ref 0.0–0.5)
Eosinophils Relative: 1 %
HCT: 35.8 % — ABNORMAL LOW (ref 39.0–52.0)
Hemoglobin: 10.8 g/dL — ABNORMAL LOW (ref 13.0–17.0)
Immature Granulocytes: 1 %
Lymphocytes Relative: 6 %
Lymphs Abs: 1.1 10*3/uL (ref 0.7–4.0)
MCH: 26.4 pg (ref 26.0–34.0)
MCHC: 30.2 g/dL (ref 30.0–36.0)
MCV: 87.5 fL (ref 80.0–100.0)
Monocytes Absolute: 1.1 10*3/uL — ABNORMAL HIGH (ref 0.1–1.0)
Monocytes Relative: 6 %
Neutro Abs: 15.5 10*3/uL — ABNORMAL HIGH (ref 1.7–7.7)
Neutrophils Relative %: 86 %
Platelets: 306 10*3/uL (ref 150–400)
RBC: 4.09 MIL/uL — ABNORMAL LOW (ref 4.22–5.81)
RDW: 17.2 % — ABNORMAL HIGH (ref 11.5–15.5)
WBC: 18 10*3/uL — ABNORMAL HIGH (ref 4.0–10.5)
nRBC: 0 % (ref 0.0–0.2)

## 2019-04-13 LAB — PROTIME-INR
INR: 4.3 (ref 0.8–1.2)
Prothrombin Time: 40.5 seconds — ABNORMAL HIGH (ref 11.4–15.2)

## 2019-04-13 LAB — LACTIC ACID, PLASMA: Lactic Acid, Venous: 1.5 mmol/L (ref 0.5–1.9)

## 2019-04-13 NOTE — ED Provider Notes (Signed)
[facility] EMERGENCY DEPARTMENT Provider Note   CSN: [id number] Arrival date & time: 04/13/19  2221     History   Chief Complaint Chief Complaint  Patient presents with  . Abnormal Lab    HPI Aaron Frye is a 85 y.o. male.     Patient presents to the emergency department with a chief complaint of normal lab.  Aaron Frye was recently admitted for left hip fracture.  Is currently at home and has skilled nursing coming into his house.  Reports that Aaron Frye had an elevated potassium level.  Skilled nursing was also concern for unknown infection.  No fevers at home.  Patient denies being in any pain.  There are no other associated symptoms.  The history is provided by the patient. No language interpreter was used.    Past Medical History:  Diagnosis Date  . Atrial fibrillation ([place])    Persisent   . CVA (cerebral vascular accident) ([place])   . Femur fracture, right [facility]) March 2016  . HLD (hyperlipidemia)   . HTN (hypertension)   . Mitral stenosis   . Non-ischemic cardiomyopathy ([place])    LVEF=35-40% by echo 2009  . Prostate cancer ([place])   . TIA (transient ischemic attack)     Patient Active Problem List   Diagnosis Date Noted  . Closed left hip fracture, initial encounter ([place]) 03/30/2019  . Chronic respiratory failure with hypoxia ([place])   . Elevated troponin 10/20/2017  . SOB (shortness of breath)   . Hyperglycemia   . COPD (chronic obstructive pulmonary disease) ([place])   . Diastolic CHF, acute on chronic ([facility])   . Mitral stenosis   . Leukocytosis 09/24/2014  . Intertrochanteric fracture of right femur ([place]) 09/24/2014  . Hip fracture ([place]) 09/23/2014  . History of stroke 09/23/2014  . HLD (hyperlipidemia) 09/23/2014  . Fall   . Encounter for therapeutic drug monitoring 09/01/2013  . Long term current use of anticoagulant therapy 11/14/2010  . Congestive heart failure ([place]) 04/18/2010  . EDEMA 10/12/2008  . Elevated lipids 10/08/2008  . Essential  hypertension 10/08/2008  . Atrial fibrillation, chronic ([place]) 10/08/2008  . CVA 10/08/2008  . Transient cerebral ischemia 10/08/2008    Past Surgical History:  Procedure Laterality Date  . APPENDECTOMY    . FEMUR IM NAIL Left 04/01/2019   Procedure: INTRAMEDULLARY (IM) NAIL FEMORAL;  Surgeon: Rod Can, MD;  Location: [facility];  Service: Orthopedics;  Laterality: Left;  . INTRAMEDULLARY (IM) NAIL INTERTROCHANTERIC Right 09/24/2014   Procedure: INTRAMEDULLARY (IM) NAIL INTERTROCHANTRIC;  Surgeon: Rod Can, MD;  Location: [facility];  Service: Orthopedics;  Laterality: Right;  . TONSILLECTOMY          Home Medications    Prior to Admission medications   Medication Sig Start Date End Date Taking? Authorizing Provider  acetaminophen (TYLENOL) 325 MG tablet Take 325 mg by mouth 2 (two) times daily as needed (for headaches).    [provider]  albuterol (PROVENTIL HFA;VENTOLIN HFA) 108 (90 Base) MCG/ACT inhaler Inhale 2 puffs into the lungs every 6 (six) hours as needed for wheezing or shortness of breath.  07/03/17   [provider]  atorvastatin (LIPITOR) 40 MG tablet Take 1 tablet by mouth once daily Patient taking differently: Take 40 mg by mouth daily at 6 PM.  01/29/19   Josue Hector, MD  furosemide (LASIX) 20 MG tablet Take 3 tablets (60 mg total) by mouth 2 (two) times daily. 12/23/18   Josue Hector, MD  ipratropium-albuterol (DUONEB) 0.5-2.5 (3) MG/3ML SOLN Inhale 3 mLs into the lungs every 6 (six) hours as needed (for shortness of breath or wheezing).  07/25/17   [provider]  metoprolol succinate (TOPROL-XL) 25 MG 24 hr tablet Take 3 tablets by mouth once daily Patient taking differently: Take 75 mg by mouth daily.  02/05/19   Josue Hector, MD  moxifloxacin (VIGAMOX) 0.5 % ophthalmic solution Place 1 drop into the right eye See admin instructions. Instill 1 drop into the right eye daily AS DIRECTED for 2 days after ocular injection every three  months 10/03/17   [provider]  Multiple Vitamins-Minerals (ADULT ONE DAILY GUMMIES) CHEW Chew 1 tablet by mouth daily.    [provider]  OXYGEN Inhale 2 L into the lungs continuous.    [provider]  potassium chloride SA (KLOR-CON) 20 MEQ tablet Take 2 tablets (40 mEq total) by mouth 2 (two) times daily. 04/07/19   Bensimhon, Shaune Pascal, MD  warfarin (COUMADIN) 5 MG tablet Take 1 tablet daily or as directed by Coumadin clinic Patient taking differently: Take 5 mg by mouth daily at 6 PM.  02/05/19   Josue Hector, MD    Family History Family History  Problem Relation Age of Onset  . Lung disease Father   . Heart failure Mother   . Cancer Brother        Not sure which type of cancer  . Heart attack Brother   . Hypertension Son   . Stroke Neg Hx     Social History Social History   Tobacco Use  . Smoking status: Never Smoker  . Smokeless tobacco: Never Used  Substance Use Topics  . Alcohol use: No  . Drug use: No     Allergies   Penicillins   Review of Systems Review of Systems  All other systems reviewed and are negative.    Physical Exam Updated Vital Signs BP (!) 111/55 (BP Location: Left Arm)   Pulse 73   Temp 98.5 F (36.9 C) (Oral)   Resp 17   SpO2 100%   Physical Exam Vitals signs and nursing note reviewed.  Constitutional:      Appearance: Aaron Frye is well-developed.  HENT:     Head: Normocephalic and atraumatic.  Eyes:     Conjunctiva/sclera: Conjunctivae normal.  Neck:     Musculoskeletal: Neck supple.  Cardiovascular:     Rate and Rhythm: Normal rate and regular rhythm.     Heart sounds: No murmur.  Pulmonary:     Effort: Pulmonary effort is normal. No respiratory distress.     Breath sounds: Normal breath sounds.     Comments: CTAB Abdominal:     Palpations: Abdomen is soft.     Tenderness: There is no abdominal tenderness.  Skin:    General: Skin is warm and dry.     Comments: No redness or sign of infection  around recent surgical incision Peripheral vascular disease bilateral lower extremities  Neurological:     Mental Status: Aaron Frye is alert and oriented to person, place, and time.  Psychiatric:        Mood and Affect: Mood normal.        Behavior: Behavior normal.      ED Treatments / Results  Labs (all labs ordered are listed, but only abnormal results are displayed) Labs Reviewed  COMPREHENSIVE METABOLIC PANEL  CBC WITH DIFFERENTIAL/PLATELET  PROTIME-INR  URINALYSIS, ROUTINE W REFLEX MICROSCOPIC  LACTIC ACID, PLASMA  LACTIC  ACID, PLASMA    EKG None  Radiology No results found.  Procedures Procedures (including critical care time)  Medications Ordered in ED Medications - No data to display   Initial Impression / Assessment and Plan / ED Course  I have reviewed the triage vital signs and the nursing notes.  Pertinent labs & imaging results that were available during my care of the patient were reviewed by me and considered in my medical decision making (see chart for details).       Patient sent to emergency room after receiving lab results from PCP.  There was some concern about possible hyperkalemia, and leukocytosis.  Patient is afebrile here.  Aaron Frye is nontoxic-appearing.  Vital signs are stable.  Aaron Frye has no evidence of infection around his recent surgical site.  Chest x-ray is noted per small right pleural effusion versus infiltrate, but Aaron Frye has not had any fever or productive cough.  Leukocytosis has been present persistently from recent laboratory work-ups.  Doubt acute infection.  Discussed case with the patient's family member, who reports that Aaron Frye has frequent UTIs, and also has history of aspiration.  Urinalysis shows white blood cells, but no other significant signs of infection.  Given history, will cover with Levaquin.  Patient seen by and discussed with Dr. Betsey Holiday, who agrees that patient can be discharged safely to home.  Return precautions discussed.   I spoke with patient's daughter, [name], and informed her of all the results, and the treatment plan.  Final Clinical Impressions(s) / ED Diagnoses      Final diagnoses:  Abnormal laboratory test    ED Discharge Orders         Ordered    levofloxacin (LEVAQUIN) 25 MG/ML solution  Daily     04/14/19 0305           Vraj, Tease, PA-C 04/14/19 0309    Orpah Greek, MD 04/14/19 [phone]

## 2019-04-13 NOTE — ED Triage Notes (Signed)
Pt bib [facility] from home due to PCP requesting patient be seen for abnormal lab work. Patient seen by PCP earlier today when he was given a call he needed to be admitted due to abnormal labs. Per ems " low hemoglobin and possible lung infection".

## 2019-04-13 NOTE — ED Notes (Signed)
Patient transported to X-ray 

## 2019-04-14 ENCOUNTER — Telehealth: Payer: Self-pay | Admitting: *Deleted

## 2019-04-14 DIAGNOSIS — R7989 Other specified abnormal findings of blood chemistry: Secondary | ICD-10-CM | POA: Diagnosis not present

## 2019-04-14 LAB — URINALYSIS, ROUTINE W REFLEX MICROSCOPIC
Bacteria, UA: NONE SEEN
Bilirubin Urine: NEGATIVE
Glucose, UA: NEGATIVE mg/dL
Hgb urine dipstick: NEGATIVE
Ketones, ur: NEGATIVE mg/dL
Nitrite: NEGATIVE
Protein, ur: NEGATIVE mg/dL
Specific Gravity, Urine: 1.008 (ref 1.005–1.030)
pH: 5 (ref 5.0–8.0)

## 2019-04-14 MED ORDER — LEVOFLOXACIN 500 MG PO TABS
500.0000 mg | ORAL_TABLET | Freq: Once | ORAL | Status: AC
  Administered 2019-04-14: 04:00:00 500 mg via ORAL
  Filled 2019-04-14: qty 1

## 2019-04-14 MED ORDER — LEVOFLOXACIN 25 MG/ML PO SOLN: 500.0000 mg | Freq: Every day | ORAL | 0 refills | Status: AC

## 2019-04-14 NOTE — ED Provider Notes (Signed)
Patient was seen earlier in the ED and was prescribed liquid Levaquin.  Per pharmacy, this is not available.  I discussed with [facility] [address] who stated that his liquid equivalent came out 500 mg daily.  Advised that patient could either crush up the tablet in a soft liquid for him to take or take as directed.  Portions of this note were generated with Lobbyist. Dictation errors may occur despite best attempts at proofreading.    Volanda Napoleon, PA-C 04/14/19 [id number]    Lajean Saver, MD 04/14/19 [phone]

## 2019-04-14 NOTE — Telephone Encounter (Signed)
Pharmacy called related to Rx: levofloxacin (LEVAQUIN) 25 MG/ML solution not being available .Marland KitchenMarland KitchenEDCM clarified with EDP (Lawyer) to change Rx to: tablets.

## 2019-04-14 NOTE — ED Notes (Signed)
pts family requesting pt to go home by [facility]

## 2019-04-14 NOTE — Discharge Instructions (Signed)
Your chest x-ray today showed small pleural effusion, but did not show any frank evidence of pneumonia, focal infection, or aspiration.  Your urinalysis had a few white blood cells, but no other significant evidence for bacteria or infection.  Due to your history we have prescribed an antibiotic for you to take over the next 7 days.  Please follow-up with your regular doctor as needed.  Return to the emergency department if you worsen in any way.

## 2019-04-14 NOTE — ED Notes (Signed)
Aaron Frye [phone] would like a call if pt is discharged or before anything is done to pt

## 2019-04-20 ENCOUNTER — Encounter (INDEPENDENT_AMBULATORY_CARE_PROVIDER_SITE_OTHER): Payer: Medicare Other | Admitting: Ophthalmology

## 2019-04-30 ENCOUNTER — Telehealth: Payer: Self-pay | Admitting: *Deleted

## 2019-04-30 NOTE — Telephone Encounter (Signed)
Noticed pt is over due for an INR check. Called and spoke to pt's wife who stated that Aaron Frye will need some one to drive him to his appointment and that he can not come out today. Informed her that we did not have to schedule the appointment for today but would like to schedule one in the near future. She stated that they would call back to schedule an appointment to get pt's INR checked.

## 2019-06-13 ENCOUNTER — Other Ambulatory Visit: Payer: Self-pay | Admitting: Cardiovascular Disease

## 2019-06-17 ENCOUNTER — Other Ambulatory Visit: Payer: Self-pay | Admitting: Cardiovascular Disease

## 2019-06-27 ENCOUNTER — Other Ambulatory Visit: Payer: Self-pay | Admitting: Cardiovascular Disease

## 2019-06-29 NOTE — Telephone Encounter (Signed)
Called and spoke w/pt who stated that they are being managed somewhere else and that the refill was sent to the wrong prescriber

## 2019-07-17 ENCOUNTER — Other Ambulatory Visit: Payer: Self-pay | Admitting: Cardiovascular Disease

## 2019-07-17 NOTE — Telephone Encounter (Addendum)
Pt is overdue for follow-up last seen 02/24/19, was due to follow-up in September 2020. Called spoke with pt's daughter, she states pt fell and broke his hip and has [facility] checking INR at present and sending results to pt's primary MD and they are managing and monitoring at present.  Pt's daughter states pt has plenty of Warfarin at present and does not need a refill at this time.  Advised pt's daughter when pt needs Warfarin refill request pharmacy to send to primary MD since they are managing and monitoring pt's Warfarin at present.

## 2019-07-24 ENCOUNTER — Encounter (INDEPENDENT_AMBULATORY_CARE_PROVIDER_SITE_OTHER): Payer: Medicare Other | Admitting: Ophthalmology

## 2019-08-02 ENCOUNTER — Emergency Department (HOSPITAL_COMMUNITY): Payer: Medicare Other

## 2019-08-02 ENCOUNTER — Inpatient Hospital Stay (HOSPITAL_COMMUNITY): Payer: Medicare Other

## 2019-08-02 ENCOUNTER — Encounter (HOSPITAL_COMMUNITY): Payer: Self-pay | Admitting: Emergency Medicine

## 2019-08-02 ENCOUNTER — Other Ambulatory Visit: Payer: Self-pay

## 2019-08-02 ENCOUNTER — Inpatient Hospital Stay (HOSPITAL_COMMUNITY)
Admission: EM | Admit: 2019-08-02 | Discharge: 2019-08-06 | DRG: 291 | Disposition: A | Discharge status: Hospice | Payer: Medicare Other | Attending: Internal Medicine | Admitting: Internal Medicine

## 2019-08-02 DIAGNOSIS — M40209 Unspecified kyphosis, site unspecified: Secondary | ICD-10-CM | POA: Diagnosis present

## 2019-08-02 DIAGNOSIS — E785 Hyperlipidemia, unspecified: Secondary | ICD-10-CM | POA: Diagnosis present

## 2019-08-02 DIAGNOSIS — L89152 Pressure ulcer of sacral region, stage 2: Secondary | ICD-10-CM | POA: Diagnosis present

## 2019-08-02 DIAGNOSIS — J810 Acute pulmonary edema: Secondary | ICD-10-CM | POA: Diagnosis present

## 2019-08-02 DIAGNOSIS — J9621 Acute and chronic respiratory failure with hypoxia: Secondary | ICD-10-CM | POA: Diagnosis present

## 2019-08-02 DIAGNOSIS — J918 Pleural effusion in other conditions classified elsewhere: Secondary | ICD-10-CM | POA: Diagnosis present

## 2019-08-02 DIAGNOSIS — Z79899 Other long term (current) drug therapy: Secondary | ICD-10-CM

## 2019-08-02 DIAGNOSIS — N182 Chronic kidney disease, stage 2 (mild): Secondary | ICD-10-CM | POA: Diagnosis present

## 2019-08-02 DIAGNOSIS — J9620 Acute and chronic respiratory failure, unspecified whether with hypoxia or hypercapnia: Secondary | ICD-10-CM

## 2019-08-02 DIAGNOSIS — J690 Pneumonitis due to inhalation of food and vomit: Secondary | ICD-10-CM | POA: Diagnosis present

## 2019-08-02 DIAGNOSIS — J939 Pneumothorax, unspecified: Secondary | ICD-10-CM

## 2019-08-02 DIAGNOSIS — Z9981 Dependence on supplemental oxygen: Secondary | ICD-10-CM

## 2019-08-02 DIAGNOSIS — Z515 Encounter for palliative care: Secondary | ICD-10-CM | POA: Diagnosis present

## 2019-08-02 DIAGNOSIS — Z66 Do not resuscitate: Secondary | ICD-10-CM | POA: Diagnosis not present

## 2019-08-02 DIAGNOSIS — Z8249 Family history of ischemic heart disease and other diseases of the circulatory system: Secondary | ICD-10-CM

## 2019-08-02 DIAGNOSIS — R627 Adult failure to thrive: Secondary | ICD-10-CM | POA: Diagnosis present

## 2019-08-02 DIAGNOSIS — E876 Hypokalemia: Secondary | ICD-10-CM | POA: Diagnosis present

## 2019-08-02 DIAGNOSIS — H9190 Unspecified hearing loss, unspecified ear: Secondary | ICD-10-CM | POA: Diagnosis present

## 2019-08-02 DIAGNOSIS — I482 Chronic atrial fibrillation, unspecified: Secondary | ICD-10-CM | POA: Diagnosis present

## 2019-08-02 DIAGNOSIS — R1310 Dysphagia, unspecified: Secondary | ICD-10-CM | POA: Diagnosis present

## 2019-08-02 DIAGNOSIS — I4819 Other persistent atrial fibrillation: Secondary | ICD-10-CM | POA: Diagnosis present

## 2019-08-02 DIAGNOSIS — N390 Urinary tract infection, site not specified: Secondary | ICD-10-CM | POA: Diagnosis present

## 2019-08-02 DIAGNOSIS — Z8673 Personal history of transient ischemic attack (TIA), and cerebral infarction without residual deficits: Secondary | ICD-10-CM

## 2019-08-02 DIAGNOSIS — Z681 Body mass index (BMI) 19 or less, adult: Secondary | ICD-10-CM | POA: Diagnosis not present

## 2019-08-02 DIAGNOSIS — I340 Nonrheumatic mitral (valve) insufficiency: Secondary | ICD-10-CM | POA: Diagnosis not present

## 2019-08-02 DIAGNOSIS — Z7189 Other specified counseling: Secondary | ICD-10-CM | POA: Diagnosis not present

## 2019-08-02 DIAGNOSIS — J449 Chronic obstructive pulmonary disease, unspecified: Secondary | ICD-10-CM | POA: Diagnosis not present

## 2019-08-02 DIAGNOSIS — I342 Nonrheumatic mitral (valve) stenosis: Secondary | ICD-10-CM | POA: Diagnosis not present

## 2019-08-02 DIAGNOSIS — B9620 Unspecified Escherichia coli [E. coli] as the cause of diseases classified elsewhere: Secondary | ICD-10-CM | POA: Diagnosis present

## 2019-08-02 DIAGNOSIS — Z20822 Contact with and (suspected) exposure to covid-19: Secondary | ICD-10-CM | POA: Diagnosis present

## 2019-08-02 DIAGNOSIS — I5033 Acute on chronic diastolic (congestive) heart failure: Secondary | ICD-10-CM | POA: Diagnosis present

## 2019-08-02 DIAGNOSIS — I509 Heart failure, unspecified: Secondary | ICD-10-CM

## 2019-08-02 DIAGNOSIS — L97529 Non-pressure chronic ulcer of other part of left foot with unspecified severity: Secondary | ICD-10-CM | POA: Diagnosis present

## 2019-08-02 DIAGNOSIS — Z7901 Long term (current) use of anticoagulants: Secondary | ICD-10-CM

## 2019-08-02 DIAGNOSIS — I050 Rheumatic mitral stenosis: Secondary | ICD-10-CM

## 2019-08-02 DIAGNOSIS — Z8546 Personal history of malignant neoplasm of prostate: Secondary | ICD-10-CM

## 2019-08-02 DIAGNOSIS — I130 Hypertensive heart and chronic kidney disease with heart failure and stage 1 through stage 4 chronic kidney disease, or unspecified chronic kidney disease: Secondary | ICD-10-CM | POA: Diagnosis present

## 2019-08-02 DIAGNOSIS — N179 Acute kidney failure, unspecified: Secondary | ICD-10-CM | POA: Diagnosis present

## 2019-08-02 DIAGNOSIS — L97509 Non-pressure chronic ulcer of other part of unspecified foot with unspecified severity: Secondary | ICD-10-CM

## 2019-08-02 DIAGNOSIS — L8990 Pressure ulcer of unspecified site, unspecified stage: Secondary | ICD-10-CM | POA: Insufficient documentation

## 2019-08-02 DIAGNOSIS — J90 Pleural effusion, not elsewhere classified: Secondary | ICD-10-CM

## 2019-08-02 DIAGNOSIS — Z809 Family history of malignant neoplasm, unspecified: Secondary | ICD-10-CM

## 2019-08-02 DIAGNOSIS — I5023 Acute on chronic systolic (congestive) heart failure: Secondary | ICD-10-CM | POA: Diagnosis not present

## 2019-08-02 DIAGNOSIS — Z9889 Other specified postprocedural states: Secondary | ICD-10-CM

## 2019-08-02 DIAGNOSIS — R0600 Dyspnea, unspecified: Secondary | ICD-10-CM

## 2019-08-02 DIAGNOSIS — Z880 Allergy status to penicillin: Secondary | ICD-10-CM

## 2019-08-02 HISTORY — DX: Other specified disorders of veins: I87.8

## 2019-08-02 HISTORY — DX: Pulmonary hypertension, unspecified: I27.20

## 2019-08-02 HISTORY — DX: Non-pressure chronic ulcer of unspecified part of unspecified lower leg with unspecified severity: L97.909

## 2019-08-02 HISTORY — DX: Chronic diastolic (congestive) heart failure: I50.32

## 2019-08-02 HISTORY — DX: Varicose veins of unspecified lower extremity with ulcer of unspecified site: I83.009

## 2019-08-02 HISTORY — DX: Chronic atrial fibrillation, unspecified: I48.20

## 2019-08-02 HISTORY — DX: Nonrheumatic mitral (valve) insufficiency: I34.0

## 2019-08-02 HISTORY — DX: Anemia, unspecified: D64.9

## 2019-08-02 HISTORY — DX: Disorder of arteries and arterioles, unspecified: I77.9

## 2019-08-02 HISTORY — DX: Chronic obstructive pulmonary disease, unspecified: J44.9

## 2019-08-02 HISTORY — DX: Chronic respiratory failure, unspecified whether with hypoxia or hypercapnia: J96.10

## 2019-08-02 LAB — URINALYSIS, ROUTINE W REFLEX MICROSCOPIC
Bilirubin Urine: NEGATIVE
Glucose, UA: NEGATIVE mg/dL
Hgb urine dipstick: NEGATIVE
Ketones, ur: NEGATIVE mg/dL
Nitrite: NEGATIVE
Protein, ur: NEGATIVE mg/dL
Specific Gravity, Urine: 1.009 (ref 1.005–1.030)
WBC, UA: >50 WBC/hpf — ABNORMAL HIGH (ref 0–5)
pH: 6 (ref 5.0–8.0)

## 2019-08-02 LAB — CBC WITH DIFFERENTIAL/PLATELET
Abs Immature Granulocytes: 0.13 10*3/uL — ABNORMAL HIGH (ref 0.00–0.07)
Basophils Absolute: 0.1 10*3/uL (ref 0.0–0.1)
Basophils Relative: 0 %
Eosinophils Absolute: 0.1 10*3/uL (ref 0.0–0.5)
Eosinophils Relative: 1 %
HCT: 36.9 % — ABNORMAL LOW (ref 39.0–52.0)
Hemoglobin: 11 g/dL — ABNORMAL LOW (ref 13.0–17.0)
Immature Granulocytes: 1 %
Lymphocytes Relative: 5 %
Lymphs Abs: 1.1 10*3/uL (ref 0.7–4.0)
MCH: 25.6 pg — ABNORMAL LOW (ref 26.0–34.0)
MCHC: 29.8 g/dL — ABNORMAL LOW (ref 30.0–36.0)
MCV: 85.8 fL (ref 80.0–100.0)
Monocytes Absolute: 1.4 10*3/uL — ABNORMAL HIGH (ref 0.1–1.0)
Monocytes Relative: 7 %
Neutro Abs: 18 10*3/uL — ABNORMAL HIGH (ref 1.7–7.7)
Neutrophils Relative %: 86 %
Platelets: 232 10*3/uL (ref 150–400)
RBC: 4.3 MIL/uL (ref 4.22–5.81)
RDW: 18.5 % — ABNORMAL HIGH (ref 11.5–15.5)
WBC: 20.8 10*3/uL — ABNORMAL HIGH (ref 4.0–10.5)
nRBC: 0 % (ref 0.0–0.2)

## 2019-08-02 LAB — BASIC METABOLIC PANEL
Anion gap: 13 (ref 5–15)
BUN: 23 mg/dL (ref 8–23)
CO2: 33 mmol/L — ABNORMAL HIGH (ref 22–32)
Calcium: 8.6 mg/dL — ABNORMAL LOW (ref 8.9–10.3)
Chloride: 93 mmol/L — ABNORMAL LOW (ref 98–111)
Creatinine, Ser: 1.02 mg/dL (ref 0.61–1.24)
GFR calc Af Amer: >60 mL/min (ref >60)
GFR calc non Af Amer: >60 mL/min (ref >60)
Glucose, Bld: 145 mg/dL — ABNORMAL HIGH (ref 70–99)
Potassium: 3.3 mmol/L — ABNORMAL LOW (ref 3.5–5.1)
Sodium: 139 mmol/L (ref 135–145)

## 2019-08-02 LAB — RESPIRATORY PANEL BY RT PCR (FLU A&B, COVID)
Influenza A by PCR: NEGATIVE
Influenza B by PCR: NEGATIVE
SARS Coronavirus 2 by RT PCR: NEGATIVE

## 2019-08-02 LAB — ECHOCARDIOGRAM COMPLETE

## 2019-08-02 LAB — TROPONIN I (HIGH SENSITIVITY)
Troponin I (High Sensitivity): 13 ng/L (ref <18)
Troponin I (High Sensitivity): 18 ng/L — ABNORMAL HIGH (ref <18)

## 2019-08-02 LAB — PROTIME-INR
INR: 3.1 — ABNORMAL HIGH (ref 0.8–1.2)
Prothrombin Time: 31.7 seconds — ABNORMAL HIGH (ref 11.4–15.2)

## 2019-08-02 LAB — MAGNESIUM: Magnesium: 2 mg/dL (ref 1.7–2.4)

## 2019-08-02 LAB — PROCALCITONIN: Procalcitonin: 0.85 ng/mL

## 2019-08-02 LAB — BRAIN NATRIURETIC PEPTIDE: B Natriuretic Peptide: 357 pg/mL — ABNORMAL HIGH (ref 0.0–100.0)

## 2019-08-02 MED ORDER — WARFARIN - PHARMACIST DOSING INPATIENT: Freq: Every day | Status: DC

## 2019-08-02 MED ORDER — RESOURCE THICKENUP CLEAR PO POWD
ORAL | Status: DC | PRN
  Filled 2019-08-02: qty 125

## 2019-08-02 MED ORDER — IPRATROPIUM-ALBUTEROL 0.5-2.5 (3) MG/3ML IN SOLN
3.0000 mL | Freq: Four times a day (QID) | RESPIRATORY_TRACT | Status: DC | PRN
  Administered 2019-08-02 – 2019-08-03 (×4): 3 mL via RESPIRATORY_TRACT
  Filled 2019-08-02 (×4): qty 3

## 2019-08-02 MED ORDER — FUROSEMIDE 10 MG/ML IJ SOLN
40.0000 mg | Freq: Once | INTRAMUSCULAR | Status: AC
  Administered 2019-08-02: 40 mg via INTRAVENOUS
  Filled 2019-08-02: qty 4

## 2019-08-02 MED ORDER — FUROSEMIDE 10 MG/ML IJ SOLN
40.0000 mg | Freq: Two times a day (BID) | INTRAMUSCULAR | Status: DC
  Administered 2019-08-02 – 2019-08-03 (×3): 40 mg via INTRAVENOUS
  Filled 2019-08-02 (×5): qty 4

## 2019-08-02 MED ORDER — ASPIRIN 81 MG PO CHEW
324.0000 mg | CHEWABLE_TABLET | Freq: Once | ORAL | Status: AC
  Administered 2019-08-02: 324 mg via ORAL
  Filled 2019-08-02: qty 4

## 2019-08-02 MED ORDER — SODIUM CHLORIDE 0.9 % IV SOLN
250.0000 mL | INTRAVENOUS | Status: DC | PRN
  Administered 2019-08-04: 250 mL via INTRAVENOUS

## 2019-08-02 MED ORDER — COLLAGENASE 250 UNIT/GM EX OINT
TOPICAL_OINTMENT | Freq: Every day | CUTANEOUS | Status: DC
  Administered 2019-08-06: 1 via TOPICAL
  Filled 2019-08-02 (×2): qty 30

## 2019-08-02 MED ORDER — ACETAMINOPHEN 325 MG PO TABS: 650.0000 mg | ORAL_TABLET | ORAL | Status: DC | PRN

## 2019-08-02 MED ORDER — SODIUM CHLORIDE 0.9% FLUSH
3.0000 mL | Freq: Two times a day (BID) | INTRAVENOUS | Status: DC
  Administered 2019-08-03 – 2019-08-06 (×7): 3 mL via INTRAVENOUS

## 2019-08-02 MED ORDER — POTASSIUM CHLORIDE CRYS ER 20 MEQ PO TBCR
40.0000 meq | EXTENDED_RELEASE_TABLET | Freq: Once | ORAL | Status: AC
  Administered 2019-08-02: 40 meq via ORAL
  Filled 2019-08-02: qty 2

## 2019-08-02 MED ORDER — SODIUM CHLORIDE 0.9% FLUSH: 3.0000 mL | INTRAVENOUS | Status: DC | PRN

## 2019-08-02 NOTE — Progress Notes (Addendum)
[place] for Warfarin  Indication: atrial fibrillation  Allergies  Allergen Reactions  . Penicillins Rash and Other (See Comments)    Has patient had a PCN reaction causing immediate rash, facial/tongue/throat swelling, SOB or lightheadedness with hypotension: Yes Has patient had a PCN reaction causing severe rash involving mucus membranes or skin necrosis: Unk Has patient had a PCN reaction that required hospitalization: No Has patient had a PCN reaction occurring within the last 10 years: No If all of the above answers are "NO", then may proceed with Cephalosporin use.     Vital Signs: Temp: 97.8 F (36.6 C) (01/31 0126) Temp Source: Oral (01/31 0126) BP: 122/65 (01/31 0400) Pulse Rate: 93 (01/31 0400)  Labs: Recent Labs    08/02/19 0135 08/02/19 0354  HGB 11.0*  --   HCT 36.9*  --   PLT 232  --   LABPROT  --  31.7*  INR  --  3.1*  CREATININE 1.02  --   TROPONINIHS 13  --     CrCl cannot be calculated (Unknown ideal weight.).   Assessment: 86 y/o M in the ED with shortness of breath, on warfarin PTA for afib, INR is supra-therapeutic this AM at 3.1, Hgb 11, Plts good  Goal of Therapy:  INR 2-3 Monitor platelets by anticoagulation protocol: Yes   Plan:  -Hold warfarin today -Daily PT/INR, re-start warfarin as INR allows -Monitor for bleeding  Narda Bonds, PharmD, BCPS Clinical Pharmacist Phone: [phone]

## 2019-08-02 NOTE — ED Notes (Signed)
Pt transported to XRAY °

## 2019-08-02 NOTE — Progress Notes (Signed)
Echocardiogram 2D Echocardiogram has been performed.  Oneal Deputy Clint Strupp 08/02/2019, 2:43 PM

## 2019-08-02 NOTE — ED Notes (Signed)
Hold 10am Lasix dose per Dr. Louanne Belton d/t pt's low BP; will continue to monitor.

## 2019-08-02 NOTE — ED Notes (Signed)
First set of cultures collected and sent to lab. RN X2 attempt to draw second set, unsuccessful.

## 2019-08-02 NOTE — Progress Notes (Signed)
Same day note  Patient seen and examined at bedside.  Patient was admitted to the hospital for shortness of breath  At the time of my evaluation, patient complains of mild dyspnea.  Hard of hearing  Physical examination reveals thinly built male with hard of hearing.  Nasal cannula in place.  Chest with crackles and diminished breath sounds. Irregular rhythm. Lower extremity with left lower extremity bulla and chronic venous stasis changes with mild pitting edema.  Laboratory data and imaging was reviewed for the last 24 hours.  Assessment and Plan.  Acute on chronic diastolic congestive heart failure, severe mitral stenosis with moderate mitral regurgitation, pulmonary hypertension.  Chest x-ray with pleural effusion and pulmonary edema.  COPD with restrictive lung disease on 2 L of oxygen at baseline.  Currently on 2 L of oxygen by nasal cannula.  Left lateral foot ulceration.  Neuropathic ulcer.. Present on admission.  Seen by wound care.  Continue wound care.  Persistent atrial fibrillation on Coumadin.  History of CVA .  Continue Coumadin  Essential Hypertension.  Borderline hypotensive.  Hold Lasix with parameters.  Leukocytosis on presentation.  Appears to be chronically elevated.  Follow blood cultures.  Hypokalemia.  Mild. Will replace and check in am.  Ambulatory dysfunction. Walks with walker. Lives with his wife at home.  Family communication.  Spoke with the patient's daughter Ms. [name] on the phone and updated her about the clinical condition of the patient.  She states that the patient is a full code at this time.  No Charge  Signed,  Delila Pereyra, MD [facility]

## 2019-08-02 NOTE — ED Notes (Signed)
Paged Attending provider (Dr. Louanne Belton) regarding low blood pressures.

## 2019-08-02 NOTE — ED Notes (Signed)
[phone] pts daughter Rica Mast wants to know pts status

## 2019-08-02 NOTE — ED Notes (Signed)
Called pt daughter [name] to update her on pt status.

## 2019-08-02 NOTE — H&P (Addendum)
History and Physical    Aaron Frye [id number] DOB: 09/26/1924 DOA: 08/02/2019  PCP: Nickola Major, MD Patient coming from: Home  Chief Complaint: Shortness of breath  HPI: Aaron Frye is a 86 y.o. male with medical history significant of persistent atrial fibrillation on Coumadin, CVA, hypertension, hyperlipidemia, severe mitral stenosis with moderate mitral regurgitation, chronic diastolic congestive heart failure, pulmonary hypertension, COPD and restrictive lung disease due to kyphosis with chronic hypoxic respiratory failure on 2 L home oxygen presenting to the ED via EMS for evaluation of shortness of breath.  Oxygen saturation 80% on room air per EMS and improved to 96% on a nonrebreather.  Patient able to provide limited history as he has difficulty hearing.  Reports having dyspnea and orthopnea.  Unclear how long ago symptoms started.  Denies cough or fevers.  He manages his medications on his own, unclear if he has been taking Lasix.  Denies nausea, vomiting, abdominal pain, diarrhea, or dysuria.  No other complaints.  ED Course: Afebrile.  Tachypneic.  WBC count 20.8, chronically elevated.  Potassium 3.3.  Initial high-sensitivity troponin negative, repeat pending.  BNP 357.  Blood culture x2 pending.  SARS-CoV-2 PCR test negative.  Influenza panel negative.  Chest x-ray showing cardiomegaly with pleural effusions and pulmonary edema. Patient received aspirin 324 mg, IV Lasix 40 mg, and potassium supplementation.  Review of Systems:  All systems reviewed and apart from history of presenting illness, are negative.  Past Medical History:  Diagnosis Date  . Atrial fibrillation ([place])    Persisent   . CVA (cerebral vascular accident) ([place])   . Femur fracture, right [facility]) March 2016  . HLD (hyperlipidemia)   . HTN (hypertension)   . Mitral stenosis   . Non-ischemic cardiomyopathy ([place])    LVEF=35-40% by echo 2009  . Prostate cancer ([place])   . TIA (transient ischemic  attack)     Past Surgical History:  Procedure Laterality Date  . APPENDECTOMY    . FEMUR IM NAIL Left 04/01/2019   Procedure: INTRAMEDULLARY (IM) NAIL FEMORAL;  Surgeon: Rod Can, MD;  Location: [place];  Service: Orthopedics;  Laterality: Left;  . INTRAMEDULLARY (IM) NAIL INTERTROCHANTERIC Right 09/24/2014   Procedure: INTRAMEDULLARY (IM) NAIL INTERTROCHANTRIC;  Surgeon: Rod Can, MD;  Location: [place];  Service: Orthopedics;  Laterality: Right;  . TONSILLECTOMY       reports that he has never smoked. He has never used smokeless tobacco. He reports that he does not drink alcohol or use drugs.  Allergies  Allergen Reactions  . Penicillins Rash and Other (See Comments)    Has patient had a PCN reaction causing immediate rash, facial/tongue/throat swelling, SOB or lightheadedness with hypotension: Yes Has patient had a PCN reaction causing severe rash involving mucus membranes or skin necrosis: Unk Has patient had a PCN reaction that required hospitalization: No Has patient had a PCN reaction occurring within the last 10 years: No If all of the above answers are "NO", then may proceed with Cephalosporin use.     Family History  Problem Relation Age of Onset  . Lung disease Father   . Heart failure Mother   . Cancer Brother        Not sure which type of cancer  . Heart attack Brother   . Hypertension Son   . Stroke Neg Hx     Prior to Admission medications   Medication Sig Start Date End Date Taking? Authorizing Provider  acetaminophen (TYLENOL) 325 MG tablet Take 325  mg by mouth 2 (two) times daily as needed (for headaches).    [provider]  albuterol (PROVENTIL HFA;VENTOLIN HFA) 108 (90 Base) MCG/ACT inhaler Inhale 2 puffs into the lungs every 6 (six) hours as needed for wheezing or shortness of breath.  07/03/17   [provider]  atorvastatin (LIPITOR) 40 MG tablet Take 1 tablet by mouth once daily Patient taking differently: Take 40 mg by mouth  daily at 6 PM.  01/29/19   Aaron Hector, MD  furosemide (LASIX) 20 MG tablet TAKE 3 TABLETS BY MOUTH TWICE A DAY 06/17/19   Aaron Hector, MD  ipratropium-albuterol (DUONEB) 0.5-2.5 (3) MG/3ML SOLN Inhale 3 mLs into the lungs every 6 (six) hours as needed (for shortness of breath or wheezing).  07/25/17   [provider]  metoprolol succinate (TOPROL-XL) 25 MG 24 hr tablet Take 3 tablets by mouth once daily Patient taking differently: Take 75 mg by mouth daily.  02/05/19   Aaron Hector, MD  moxifloxacin (VIGAMOX) 0.5 % ophthalmic solution Place 1 drop into the right eye See admin instructions. Instill 1 drop into the right eye daily AS DIRECTED for 2 days after ocular injection every three months 10/03/17   [provider]  Multiple Vitamins-Minerals (ADULT ONE DAILY GUMMIES) CHEW Chew 1 tablet by mouth daily.    [provider]  OXYGEN Inhale 2 L into the lungs continuous.    [provider]  potassium chloride SA (KLOR-CON) 20 MEQ tablet Take 2 tablets (40 mEq total) by mouth 2 (two) times daily. 04/07/19   Frye, Aaron Pascal, MD  warfarin (COUMADIN) 5 MG tablet Take 1 tablet daily or as directed by Coumadin clinic Patient taking differently: Take 5 mg by mouth daily at 6 PM.  02/05/19   Aaron Hector, MD    Physical Exam: Vitals:   08/02/19 0315 08/02/19 0330 08/02/19 0400 08/02/19 0430  BP: 115/62 (!) 106/59 122/65 130/71  Pulse: 87 92 93 (!) 102  Resp: (!) 23 (!) 22 (!) 25 (!) 26  Temp:      TempSrc:      SpO2: 100% 100% 93% 97%    Physical Exam  Constitutional: No distress.  Frail elderly male  HENT:  Head: Normocephalic.  Eyes: Right eye exhibits no discharge. Left eye exhibits no discharge.  Cardiovascular: Normal rate, regular rhythm and intact distal pulses.  Pulmonary/Chest: He has no wheezes. He has rales.  On 3 L supplemental oxygen Bibasilar rales  Abdominal: Soft. Bowel sounds are normal. He exhibits no distension. There is no  abdominal tenderness. There is no guarding.  Musculoskeletal:        General: Edema present.     Cervical back: Neck supple.     Comments: +3 pedal edema  Neurological:  Awake and alert  Skin: Skin is warm and dry. He is not diaphoretic.  A small ulcer noted on the lateral aspect of the left foot without obvious signs of infection/surrounding cellulitis.   Labs on Admission: I have personally reviewed following labs and imaging studies  CBC: Recent Labs  Lab 08/02/19 0135  WBC 20.8*  NEUTROABS 18.0*  HGB 11.0*  HCT 36.9*  MCV 85.8  PLT [id number]   Basic Metabolic Panel: Recent Labs  Lab 08/02/19 0135  NA 139  K 3.3*  CL 93*  CO2 33*  GLUCOSE 145*  BUN 23  CREATININE 1.02  CALCIUM 8.6*   GFR: CrCl cannot be calculated (Unknown ideal weight.). Liver Function Tests:  No results for input(s): AST, ALT, ALKPHOS, BILITOT, PROT, ALBUMIN in the last 168 hours. No results for input(s): LIPASE, AMYLASE in the last 168 hours. No results for input(s): AMMONIA in the last 168 hours. Coagulation Profile: Recent Labs  Lab 08/02/19 0354  INR 3.1*   Cardiac Enzymes: No results for input(s): CKTOTAL, CKMB, CKMBINDEX, TROPONINI in the last 168 hours. BNP (last 3 results) No results for input(s): PROBNP in the last 8760 hours. HbA1C: No results for input(s): HGBA1C in the last 72 hours. CBG: No results for input(s): GLUCAP in the last 168 hours. Lipid Profile: No results for input(s): CHOL, HDL, LDLCALC, TRIG, CHOLHDL, LDLDIRECT in the last 72 hours. Thyroid Function Tests: No results for input(s): TSH, T4TOTAL, FREET4, T3FREE, THYROIDAB in the last 72 hours. Anemia Panel: No results for input(s): VITAMINB12, FOLATE, FERRITIN, TIBC, IRON, RETICCTPCT in the last 72 hours. Urine analysis:    Component Value Date/Time   COLORURINE YELLOW 04/14/2019 0215   APPEARANCEUR CLEAR 04/14/2019 0215   LABSPEC 1.008 04/14/2019 0215   PHURINE 5.0 04/14/2019 0215   GLUCOSEU NEGATIVE  04/14/2019 0215   HGBUR NEGATIVE 04/14/2019 0215   BILIRUBINUR NEGATIVE 04/14/2019 0215   KETONESUR NEGATIVE 04/14/2019 0215   PROTEINUR NEGATIVE 04/14/2019 0215   UROBILINOGEN 0.2 10/02/2014 0301   NITRITE NEGATIVE 04/14/2019 0215   LEUKOCYTESUR SMALL (A) 04/14/2019 0215    Radiological Exams on Admission: DG Chest Portable 1 View  Result Date: 08/02/2019 CLINICAL DATA:  Shortness of breath. Hypoxia. EXAM: PORTABLE CHEST 1 VIEW COMPARISON:  Radiograph 04/13/2019, 10/20/2017. FINDINGS: Chronic cardiomegaly, possibly increased from prior. Aortic atherosclerosis. Moderate right and small left pleural effusion, slightly increased from prior exam. Adjacent bibasilar airspace disease favors atelectasis. Reticular opacities with increased septal thickening at the bases suspicious for pulmonary edema. No pneumothorax. Bones are diffusely under mineralized. IMPRESSION: 1. CHF pattern. Cardiomegaly with pleural effusions and pulmonary edema. Findings have progressed from 04/13/2019 radiograph. 2.  Aortic Atherosclerosis (ICD10-I70.0). Electronically Signed   By: Keith Rake M.D.   On: 08/02/2019 02:10    EKG: Independently reviewed.  Atrial fibrillation, heart rate 96.  Assessment/Plan Principal Problem:   Acute exacerbation of CHF (congestive heart failure) (HCC) Active Problems:   Atrial fibrillation, chronic (HCC)   Mitral stenosis   Acute on chronic respiratory failure (HCC)   COPD (chronic obstructive pulmonary disease) (HCC)   Acute on chronic hypoxemic respiratory failure secondary to acute exacerbation of chronic diastolic congestive heart failure and severe mitral stenosis Oxygen saturation in the 80s on room air, currently requiring 3 L supplemental oxygen.  BNP 357.  Chest x-ray showing cardiomegaly with pleural effusions and pulmonary edema. Last echo done 07/2017 with LVEF 65 to 70%, severe mitral stenosis with moderate mitral regurgitation.  Followed by outpatient cardiology and  not a surgical candidate due to age. -Cardiac monitoring -Continue IV Lasix 40 mg twice daily -Monitor intake and output, daily weights, low-sodium diet with fluid restriction -Repeat echocardiogram -Consult cardiology in a.m.  Chronic leukocytosis Afebrile.  WBC count [id number] with neutrophilic predominance.  Has chronic leukocytosis since 2016.  Unclear if he has ever been seen by hematology.   -UA to rule out UTI -Check procalcitonin level -Blood culture x2 pending -Ensure hematology follow-up  Left foot ulcer On exam, noted to have a small ulcer on the lateral aspect of the left foot without obvious signs of infection/surrounding cellulitis.  Unclear how long this has been present.  Leukocytosis appears to be chronic.  No fever or signs of sepsis. -  X-ray ordered to rule out osteomyelitis -Wound care consult  Mild hypokalemia Potassium 3.3.  Likely related to home diuretic use. -Replete potassium.  Check magnesium level and replete if low.  Continue to monitor electrolytes.  Persistent atrial fibrillation Currently rate controlled. -Continue Coumadin for anticoagulation, dosing per pharmacy -Resume home metoprolol after pharmacy med rec is completed  Hypertension -Lasix as above -Resume home metoprolol after pharmacy med rec is completed  Hyperlipidemia -Resume home statin after pharmacy med rec is completed  COPD and restrictive lung disease due to kyphosis with chronic hypoxic respiratory failure on 2 L home oxygen -Stable.  No signs of acute exacerbation. -DuoNeb as needed -Continue supplemental oxygen  DVT prophylaxis: Coumadin Code Status: Full code.  Unable to have a discussion with the patient at this time.  Please discuss with family in a.m. Family Communication: No family available at this time. Disposition Plan: Anticipate discharge after clinical improvement. Consults called: None Admission status: It is my clinical opinion that admission to INPATIENT is  reasonable and necessary in this 86 y.o. male . presenting with acute on chronic hypoxemic respiratory failure secondary to acute exacerbation of chronic diastolic congestive heart failure and severe mitral stenosis.  Needs IV Lasix for diuresis.  Given the aforementioned, the predictability of an adverse outcome is felt to be significant. I expect that the patient will require at least 2 midnights in the hospital to treat this condition.   The medical decision making on this patient was of high complexity and the patient is at high risk for clinical deterioration, therefore this is a level 3 visit.  Shela Leff MD [facility]  If 7PM-7AM, please contact night-coverage [email or web address] Password [email or web address]  08/02/2019, 4:40 AM

## 2019-08-02 NOTE — ED Triage Notes (Signed)
Pt arrives via EMS from home with a chief complaint of SOB that started today. EMS reports the pt O2 saturation was 80% at home and they placed the pt on a non-rebreather and pt improved to 96%. Family reported a history of CHF and pt has bilateral lower leg pitting edema.Pt chronically wears 2L [place]  And is on 4L [place] at 94%.

## 2019-08-02 NOTE — ED Notes (Signed)
Pt O2 saturation at 100% on 4L [place], decreased pt to 2L [place], which is his baseline.

## 2019-08-02 NOTE — ED Provider Notes (Signed)
[facility] Provider Note   CSN: [id number] Arrival date & time: 08/02/19  0102     History Chief Complaint  Patient presents with  . Shortness of Breath    Aaron Frye is a 86 y.o. male.  The history is provided by the EMS personnel. The history is limited by the condition of the patient (level 5 caveat ).  Shortness of Breath Severity:  Severe Onset quality:  Gradual Duration:  1 day Timing:  Constant Progression:  Worsening Chronicity:  Recurrent Context: not URI   Relieved by:  Nothing Worsened by:  Nothing Ineffective treatments:  None tried Associated symptoms: no cough, no diaphoresis and no fever   Risk factors: no obesity   Baseline 2L home O2 up to 4 L at home.       Past Medical History:  Diagnosis Date  . Atrial fibrillation ([place])    Persisent   . CVA (cerebral vascular accident) ([place])   . Femur fracture, right [facility]) March 2016  . HLD (hyperlipidemia)   . HTN (hypertension)   . Mitral stenosis   . Non-ischemic cardiomyopathy ([place])    LVEF=35-40% by echo 2009  . Prostate cancer ([place])   . TIA (transient ischemic attack)     Patient Active Problem List   Diagnosis Date Noted  . Closed left hip fracture, initial encounter ([place]) 03/30/2019  . Chronic respiratory failure with hypoxia ([place])   . Elevated troponin 10/20/2017  . SOB (shortness of breath)   . Hyperglycemia   . COPD (chronic obstructive pulmonary disease) ([place])   . Diastolic CHF, acute on chronic (HCC)   . Mitral stenosis   . Leukocytosis 09/24/2014  . Intertrochanteric fracture of right femur ([place]) 09/24/2014  . Hip fracture ([place]) 09/23/2014  . History of stroke 09/23/2014  . HLD (hyperlipidemia) 09/23/2014  . Fall   . Long term current use of anticoagulant therapy 11/14/2010  . Congestive heart failure ([place]) 04/18/2010  . EDEMA 10/12/2008  . Elevated lipids 10/08/2008  . Essential hypertension 10/08/2008  . Atrial fibrillation, chronic  ([place]) 10/08/2008  . CVA 10/08/2008  . Transient cerebral ischemia 10/08/2008    Past Surgical History:  Procedure Laterality Date  . APPENDECTOMY    . FEMUR IM NAIL Left 04/01/2019   Procedure: INTRAMEDULLARY (IM) NAIL FEMORAL;  Surgeon: Rod Can, MD;  Location: [place];  Service: Orthopedics;  Laterality: Left;  . INTRAMEDULLARY (IM) NAIL INTERTROCHANTERIC Right 09/24/2014   Procedure: INTRAMEDULLARY (IM) NAIL INTERTROCHANTRIC;  Surgeon: Rod Can, MD;  Location: [place];  Service: Orthopedics;  Laterality: Right;  . TONSILLECTOMY         Family History  Problem Relation Age of Onset  . Lung disease Father   . Heart failure Mother   . Cancer Brother        Not sure which type of cancer  . Heart attack Brother   . Hypertension Son   . Stroke Neg Hx     Social History   Tobacco Use  . Smoking status: Never Smoker  . Smokeless tobacco: Never Used  Substance Use Topics  . Alcohol use: No  . Drug use: No    Home Medications Prior to Admission medications   Medication Sig Start Date End Date Taking? Authorizing Provider  acetaminophen (TYLENOL) 325 MG tablet Take 325 mg by mouth 2 (two) times daily as needed (for headaches).    [provider]  albuterol (PROVENTIL HFA;VENTOLIN HFA) 108 (90 Base) MCG/ACT inhaler Inhale 2  puffs into the lungs every 6 (six) hours as needed for wheezing or shortness of breath.  07/03/17   [provider]  atorvastatin (LIPITOR) 40 MG tablet Take 1 tablet by mouth once daily Patient taking differently: Take 40 mg by mouth daily at 6 PM.  01/29/19   Josue Hector, MD  furosemide (LASIX) 20 MG tablet TAKE 3 TABLETS BY MOUTH TWICE A DAY 06/17/19   Josue Hector, MD  ipratropium-albuterol (DUONEB) 0.5-2.5 (3) MG/3ML SOLN Inhale 3 mLs into the lungs every 6 (six) hours as needed (for shortness of breath or wheezing).  07/25/17   [provider]  metoprolol succinate (TOPROL-XL) 25 MG 24 hr tablet Take 3 tablets by  mouth once daily Patient taking differently: Take 75 mg by mouth daily.  02/05/19   Josue Hector, MD  moxifloxacin (VIGAMOX) 0.5 % ophthalmic solution Place 1 drop into the right eye See admin instructions. Instill 1 drop into the right eye daily AS DIRECTED for 2 days after ocular injection every three months 10/03/17   [provider]  Multiple Vitamins-Minerals (ADULT ONE DAILY GUMMIES) CHEW Chew 1 tablet by mouth daily.    [provider]  OXYGEN Inhale 2 L into the lungs continuous.    [provider]  potassium chloride SA (KLOR-CON) 20 MEQ tablet Take 2 tablets (40 mEq total) by mouth 2 (two) times daily. 04/07/19   Bensimhon, Shaune Pascal, MD  warfarin (COUMADIN) 5 MG tablet Take 1 tablet daily or as directed by Coumadin clinic Patient taking differently: Take 5 mg by mouth daily at 6 PM.  02/05/19   Josue Hector, MD    Allergies    Penicillins  Review of Systems   Review of Systems  Unable to perform ROS: Dementia  Constitutional: Negative for diaphoresis and fever.  Respiratory: Positive for shortness of breath. Negative for cough.   Cardiovascular: Positive for leg swelling.  Neurological: Negative for facial asymmetry.    Physical Exam Updated Vital Signs BP (!) 106/59   Pulse 92   Temp 97.8 F (36.6 C) (Oral)   Resp (!) 22   SpO2 100%   Physical Exam Vitals and nursing note reviewed.  Constitutional:      Appearance: Normal appearance. He is not diaphoretic.  HENT:     Head: Normocephalic and atraumatic.     Nose: Nose normal.  Eyes:     Conjunctiva/sclera: Conjunctivae normal.     Pupils: Pupils are equal, round, and reactive to light.  Cardiovascular:     Rate and Rhythm: Normal rate. Rhythm irregular.     Pulses: Normal pulses.     Heart sounds: Normal heart sounds.  Pulmonary:     Breath sounds: Decreased air movement present. No wheezing.  Abdominal:     General: Abdomen is flat. Bowel sounds are normal.     Tenderness: There is  no abdominal tenderness. There is no guarding or rebound.  Musculoskeletal:        General: Normal range of motion.     Cervical back: Normal range of motion and neck supple.  Skin:    General: Skin is warm and dry.     Capillary Refill: Capillary refill takes less than 2 seconds.  Neurological:     Mental Status: He is alert.  Psychiatric:        Mood and Affect: Mood normal.        Behavior: Behavior normal.     ED Results / Procedures / Treatments  Labs (all labs ordered are listed, but only abnormal results are displayed) Results for orders placed or performed during the hospital encounter of 08/02/19  Respiratory Panel by RT PCR (Flu A&B, Covid) - Nasopharyngeal Swab   Specimen: Nasopharyngeal Swab  Result Value Ref Range   SARS Coronavirus 2 by RT PCR NEGATIVE NEGATIVE   Influenza A by PCR NEGATIVE NEGATIVE   Influenza B by PCR NEGATIVE NEGATIVE  CBC with Differential/Platelet  Result Value Ref Range   WBC 20.8 (H) 4.0 - 10.5 K/uL   RBC 4.30 4.22 - 5.81 MIL/uL   Hemoglobin 11.0 (L) 13.0 - 17.0 g/dL   HCT 36.9 (L) 39.0 - 52.0 %   MCV 85.8 80.0 - 100.0 fL   MCH 25.6 (L) 26.0 - 34.0 pg   MCHC 29.8 (L) 30.0 - 36.0 g/dL   RDW 18.5 (H) 11.5 - 15.5 %   Platelets 232 150 - 400 K/uL   nRBC 0.0 0.0 - 0.2 %   Neutrophils Relative % 86 %   Neutro Abs 18.0 (H) 1.7 - 7.7 K/uL   Lymphocytes Relative 5 %   Lymphs Abs 1.1 0.7 - 4.0 K/uL   Monocytes Relative 7 %   Monocytes Absolute 1.4 (H) 0.1 - 1.0 K/uL   Eosinophils Relative 1 %   Eosinophils Absolute 0.1 0.0 - 0.5 K/uL   Basophils Relative 0 %   Basophils Absolute 0.1 0.0 - 0.1 K/uL   Immature Granulocytes 1 %   Abs Immature Granulocytes 0.13 (H) 0.00 - 0.07 K/uL  Basic metabolic panel  Result Value Ref Range   Sodium 139 135 - 145 mmol/L   Potassium 3.3 (L) 3.5 - 5.1 mmol/L   Chloride 93 (L) 98 - 111 mmol/L   CO2 33 (H) 22 - 32 mmol/L   Glucose, Bld 145 (H) 70 - 99 mg/dL   BUN 23 8 - 23 mg/dL   Creatinine, Ser 1.02  0.61 - 1.24 mg/dL   Calcium 8.6 (L) 8.9 - 10.3 mg/dL   GFR calc non Af Amer >60 >60 mL/min   GFR calc Af Amer >60 >60 mL/min   Anion gap 13 5 - 15  Brain natriuretic peptide  Result Value Ref Range   B Natriuretic Peptide 357.0 (H) 0.0 - 100.0 pg/mL  Troponin I (High Sensitivity)  Result Value Ref Range   Troponin I (High Sensitivity) 13 <18 ng/L   DG Chest Portable 1 View  Result Date: 08/02/2019 CLINICAL DATA:  Shortness of breath. Hypoxia. EXAM: PORTABLE CHEST 1 VIEW COMPARISON:  Radiograph 04/13/2019, 10/20/2017. FINDINGS: Chronic cardiomegaly, possibly increased from prior. Aortic atherosclerosis. Moderate right and small left pleural effusion, slightly increased from prior exam. Adjacent bibasilar airspace disease favors atelectasis. Reticular opacities with increased septal thickening at the bases suspicious for pulmonary edema. No pneumothorax. Bones are diffusely under mineralized. IMPRESSION: 1. CHF pattern. Cardiomegaly with pleural effusions and pulmonary edema. Findings have progressed from 04/13/2019 radiograph. 2.  Aortic Atherosclerosis (ICD10-I70.0). Electronically Signed   By: Keith Rake M.D.   On: 08/02/2019 02:10    EKG EKG Interpretation  Date/Time:  Sunday August 02 2019 01:34:04 EST Ventricular Rate:  96 PR Interval:    QRS Duration: 94 QT Interval:  388 QTC Calculation: 491 R Axis:   159 Text Interpretation: Atrial fibrillation Right axis deviation Low voltage, extremity leads Nonspecific repol abnormality, diffuse leads Confirmed by Dory Horn) on 08/02/2019 1:50:11 AM   Radiology DG Chest Portable 1 View  Result Date: 08/02/2019 CLINICAL DATA:  Shortness  of breath. Hypoxia. EXAM: PORTABLE CHEST 1 VIEW COMPARISON:  Radiograph 04/13/2019, 10/20/2017. FINDINGS: Chronic cardiomegaly, possibly increased from prior. Aortic atherosclerosis. Moderate right and small left pleural effusion, slightly increased from prior exam. Adjacent bibasilar  airspace disease favors atelectasis. Reticular opacities with increased septal thickening at the bases suspicious for pulmonary edema. No pneumothorax. Bones are diffusely under mineralized. IMPRESSION: 1. CHF pattern. Cardiomegaly with pleural effusions and pulmonary edema. Findings have progressed from 04/13/2019 radiograph. 2.  Aortic Atherosclerosis (ICD10-I70.0). Electronically Signed   By: Keith Rake M.D.   On: 08/02/2019 02:10    Procedures Procedures (including critical care time)  Medications Ordered in ED Medications  furosemide (LASIX) injection 40 mg (has no administration in time range)  aspirin chewable tablet 324 mg (has no administration in time range)  potassium chloride SA (KLOR-CON) CR tablet 40 mEq (has no administration in time range)    ED Course  I have reviewed the triage vital signs and the nursing notes.  Pertinent labs & imaging results that were available during my care of the patient were reviewed by me and considered in my medical decision making (see chart for details).    Final Clinical Impression(s) / ED Diagnoses Final diagnoses:  Acute pulmonary edema ([place])   Admit to medicine   Koree Staheli, MD 08/02/19 [phone]

## 2019-08-02 NOTE — Consult Note (Signed)
WOC Nurse Consult Note: Reason for Consult:Left lateral foot ulceration, full thickness Wound type: Neuropathic (not pressure, do not Stage) Pressure Injury POA: N/A Measurement:2.2cm x 2cm with depth obscured by the presence of dry eschar, non viable tissue  Wound bed:50% black eschar, 50% nonviable tissue where eschar has become dislodged Drainage (amount, consistency, odor) None Periwound:No erythema, no induration Dressing procedure/placement/frequency: I will implement a POC to consist of once daily application of an enzymatic debriding agent, collagenase (Santyl).  This will be topped with a saline moistened gauze 2x2 followed by a dry gauze 2x2 and secured with Kerlix/paper tape.  The foot (feet) are to be placed in bilateral pressure redistribution heel boots. A sacral prophylactic foam dressing is recommended for pressure injury prevention.  [place] nursing team will not follow, but will remain available to this patient, the nursing and medical teams.  Please re-consult if needed. Thanks, Maudie Flakes, MSN, RN, [place], Arther Abbott  Pager# [phone]

## 2019-08-03 ENCOUNTER — Encounter (HOSPITAL_COMMUNITY): Payer: Self-pay | Admitting: Internal Medicine

## 2019-08-03 DIAGNOSIS — I482 Chronic atrial fibrillation, unspecified: Secondary | ICD-10-CM

## 2019-08-03 DIAGNOSIS — L89152 Pressure ulcer of sacral region, stage 2: Secondary | ICD-10-CM

## 2019-08-03 DIAGNOSIS — J9621 Acute and chronic respiratory failure with hypoxia: Secondary | ICD-10-CM

## 2019-08-03 DIAGNOSIS — L8990 Pressure ulcer of unspecified site, unspecified stage: Secondary | ICD-10-CM | POA: Insufficient documentation

## 2019-08-03 LAB — CBC
HCT: 29 % — ABNORMAL LOW (ref 39.0–52.0)
Hemoglobin: 8.8 g/dL — ABNORMAL LOW (ref 13.0–17.0)
MCH: 25.5 pg — ABNORMAL LOW (ref 26.0–34.0)
MCHC: 30.3 g/dL (ref 30.0–36.0)
MCV: 84.1 fL (ref 80.0–100.0)
Platelets: 186 10*3/uL (ref 150–400)
RBC: 3.45 MIL/uL — ABNORMAL LOW (ref 4.22–5.81)
RDW: 18.3 % — ABNORMAL HIGH (ref 11.5–15.5)
WBC: 18.3 10*3/uL — ABNORMAL HIGH (ref 4.0–10.5)
nRBC: 0 % (ref 0.0–0.2)

## 2019-08-03 LAB — BASIC METABOLIC PANEL
Anion gap: 14 (ref 5–15)
BUN: 30 mg/dL — ABNORMAL HIGH (ref 8–23)
CO2: 33 mmol/L — ABNORMAL HIGH (ref 22–32)
Calcium: 8.6 mg/dL — ABNORMAL LOW (ref 8.9–10.3)
Chloride: 94 mmol/L — ABNORMAL LOW (ref 98–111)
Creatinine, Ser: 1.08 mg/dL (ref 0.61–1.24)
GFR calc Af Amer: >60 mL/min (ref >60)
GFR calc non Af Amer: 59 mL/min — ABNORMAL LOW (ref >60)
Glucose, Bld: 107 mg/dL — ABNORMAL HIGH (ref 70–99)
Potassium: 4.1 mmol/L (ref 3.5–5.1)
Sodium: 141 mmol/L (ref 135–145)

## 2019-08-03 LAB — MAGNESIUM: Magnesium: 2.2 mg/dL (ref 1.7–2.4)

## 2019-08-03 LAB — PROTIME-INR
INR: 4 — ABNORMAL HIGH (ref 0.8–1.2)
Prothrombin Time: 38.9 seconds — ABNORMAL HIGH (ref 11.4–15.2)

## 2019-08-03 LAB — BRAIN NATRIURETIC PEPTIDE: B Natriuretic Peptide: 614.5 pg/mL — ABNORMAL HIGH (ref 0.0–100.0)

## 2019-08-03 LAB — PHOSPHORUS: Phosphorus: 3.9 mg/dL (ref 2.5–4.6)

## 2019-08-03 MED ORDER — ATORVASTATIN CALCIUM 40 MG PO TABS
40.0000 mg | ORAL_TABLET | Freq: Every day | ORAL | Status: DC
  Administered 2019-08-03 – 2019-08-05 (×3): 40 mg via ORAL
  Filled 2019-08-03 (×3): qty 1

## 2019-08-03 MED ORDER — ADULT MULTIVITAMIN W/MINERALS CH
1.0000 | ORAL_TABLET | Freq: Every day | ORAL | Status: DC
  Administered 2019-08-03 – 2019-08-06 (×4): 1 via ORAL
  Filled 2019-08-03 (×4): qty 1

## 2019-08-03 MED ORDER — ACETAMINOPHEN 325 MG PO TABS: 325.0000 mg | ORAL_TABLET | Freq: Two times a day (BID) | ORAL | Status: DC | PRN

## 2019-08-03 MED ORDER — METOPROLOL SUCCINATE ER 50 MG PO TB24
75.0000 mg | ORAL_TABLET | Freq: Every day | ORAL | Status: DC
  Administered 2019-08-03 – 2019-08-04 (×2): 75 mg via ORAL
  Filled 2019-08-03 (×2): qty 1

## 2019-08-03 MED ORDER — ENSURE ENLIVE PO LIQD
237.0000 mL | Freq: Two times a day (BID) | ORAL | Status: DC
  Administered 2019-08-03: 237 mL via ORAL

## 2019-08-03 NOTE — TOC Initial Note (Signed)
Transition of Care [facility]) - Initial/Assessment Note    Patient Details  Name: Aaron Frye MRN: [id number] Date of Birth: 22-Jun-1925  Transition of Care [facility]) CM/SW Contact:    Carles Collet, RN Phone Number: 08/03/2019, 3:02 PM  Clinical Narrative:       Aaron Frye w patient's daughter Aaron Frye. She wishes for patient to return home w spouse at DC. They have 24/7 caregivers, RN PT OT w [facility] [facility], and home oxygen through [facility]. Patient has 3/1, WC, RW, lift chair at home, decline need for any additional DME. Will need PTAR for DC, Medical necessity form initiated.    Aaron Frye Daughter   [phone]                Expected Discharge Plan: [place] Barriers to Discharge: Continued Medical Work up   Patient Goals and CMS Choice Patient states their goals for this hospitalization and ongoing recovery are:: spoke w daughter, wants to return home w 24/7 care givers CMS Medicare.gov Compare Post Acute Care list provided to:: Other (Comment Required) Choice offered to / list presented to : Adult Children  Expected Discharge Plan and Services Expected Discharge Plan: [place]   Discharge Planning Services: CM Consult Post Acute Care Choice: [place] arrangements for the past 2 months: Single Family Home                           HH Arranged: RN, PT, OT [place] Agency: [facility] (formerly [employer]) Date [place]: 08/03/19 Time [place]: 1501 Representative spoke with at [place]: [place] Arrangements/Services Living arrangements for the past 2 months: [place] Lives with:: Spouse   Do you feel safe going back to the place where you live?: Yes          Current home services: DME, Homehealth aide, Home OT, Home PT, Home RN    Activities of Daily Living Home Assistive Devices/Equipment: Shower chair with back, Nebulizer, Environmental consultant (specify type) ADL Screening (condition  at time of admission) Patient's cognitive ability adequate to safely complete daily activities?: Yes Is the patient deaf or have difficulty hearing?: Yes Does the patient have difficulty seeing, even when wearing glasses/contacts?: No Does the patient have difficulty concentrating, remembering, or making decisions?: No Patient able to express need for assistance with ADLs?: Yes Does the patient have difficulty dressing or bathing?: Yes Independently performs ADLs?: No Communication: Independent Dressing (OT): Needs assistance Is this a change from baseline?: Pre-admission baseline Grooming: Needs assistance Is this a change from baseline?: Pre-admission baseline Feeding: Needs assistance Is this a change from baseline?: Pre-admission baseline Bathing: Needs assistance Is this a change from baseline?: Pre-admission baseline Toileting: Needs assistance Is this a change from baseline?: Pre-admission baseline In/Out Bed: Needs assistance Is this a change from baseline?: Pre-admission baseline Walks in Home: Needs assistance Is this a change from baseline?: Pre-admission baseline Does the patient have difficulty walking or climbing stairs?: Yes Weakness of Legs: Both Weakness of Arms/Hands: Both  Permission Sought/Granted                  Emotional Assessment              Admission diagnosis:  Acute pulmonary edema ([place]) [J81.0] Foot ulcer ([place]) [L97.509] Acute exacerbation of CHF (congestive heart failure) ([place]) [I50.9] Patient Active Problem List   Diagnosis Date Noted  . Pressure injury  of skin 08/03/2019  . Acute exacerbation of CHF (congestive heart failure) ([place]) 08/02/2019  . Closed left hip fracture, initial encounter ([place]) 03/30/2019  . Chronic respiratory failure with hypoxia ([place])   . Elevated troponin 10/20/2017  . Acute on chronic respiratory failure ([place]) 10/19/2017  . SOB (shortness of breath)   . Hyperglycemia   . COPD (chronic obstructive pulmonary  disease) ([place])   . Diastolic CHF, acute on chronic (HCC)   . Mitral stenosis   . Leukocytosis 09/24/2014  . Intertrochanteric fracture of right femur ([place]) 09/24/2014  . Hip fracture ([place]) 09/23/2014  . History of stroke 09/23/2014  . HLD (hyperlipidemia) 09/23/2014  . Fall   . Long term current use of anticoagulant therapy 11/14/2010  . Congestive heart failure ([place]) 04/18/2010  . EDEMA 10/12/2008  . Elevated lipids 10/08/2008  . Essential hypertension 10/08/2008  . Atrial fibrillation, chronic ([place]) 10/08/2008  . CVA 10/08/2008  . Transient cerebral ischemia 10/08/2008   PCP:  Nickola Major, MD Pharmacy:   [facility] [address], [place] - [address] [place] [place] [address] Phone: [phone] Fax: [phone]  [id number] - [place], [place]. [address]. [place] [address] Phone: [phone] Fax: [phone]     Social Determinants of Health (SDOH) Interventions    Readmission Risk Interventions No flowsheet data found.

## 2019-08-03 NOTE — Plan of Care (Signed)

## 2019-08-03 NOTE — Progress Notes (Signed)
[facility] for Warfarin  Indication: atrial fibrillation  Allergies  Allergen Reactions  . Penicillins Rash and Other (See Comments)    Has patient had a PCN reaction causing immediate rash, facial/tongue/throat swelling, SOB or lightheadedness with hypotension: Yes Has patient had a PCN reaction causing severe rash involving mucus membranes or skin necrosis: Unk Has patient had a PCN reaction that required hospitalization: No Has patient had a PCN reaction occurring within the last 10 years: No If all of the above answers are "NO", then may proceed with Cephalosporin use.     Vital Signs: Temp: 97.8 F (36.6 C) (02/01 0728) Temp Source: Oral (02/01 0728) BP: 116/65 (02/01 0728) Pulse Rate: 98 (02/01 0728)  Labs: Recent Labs    08/02/19 0135 08/02/19 0354 08/03/19 0212  HGB 11.0*  --  8.8*  HCT 36.9*  --  29.0*  PLT 232  --  186  LABPROT  --  31.7* 38.9*  INR  --  3.1* 4.0*  CREATININE 1.02  --  1.08  TROPONINIHS 13 18*  --     Estimated Creatinine Clearance: 36.5 mL/min (by C-G formula based on SCr of 1.08 mg/dL).   Assessment: 86 y/o M in the ED with shortness of breath, on warfarin PTA for afib.  PTA warfarin dose 2.5 mg daily except 5 mg on Mon, Fri, last dose taken 1/30.  Hgb today 11 > 8.8;  Pltc 232 > 186  Goal of Therapy:  INR 2-3 Monitor platelets by anticoagulation protocol: Yes   Plan:  - Continue to hold warfarin today -Daily PT/INR, re-start warfarin as INR allows -Monitor for bleeding  Marguerite Olea, [facility] Clinical Pharmacist Phone [phone]  08/03/2019 9:28 AM

## 2019-08-03 NOTE — Progress Notes (Addendum)
PROGRESS NOTE  Aaron Frye [id number] DOB: Aug 17, 1924 DOA: 08/02/2019 PCP: Nickola Major, MD   LOS: 1 day   Brief narrative:  As per HPI,  Aaron Frye is a 86 y.o. male with medical history significant of persistent atrial fibrillation on Coumadin, CVA, hypertension, hyperlipidemia, severe mitral stenosis with moderate mitral regurgitation, chronic diastolic congestive heart failure, pulmonary hypertension, COPD and restrictive lung disease due to kyphosis with chronic hypoxic respiratory failure on 2 L home oxygen presented to the ED via EMS for evaluation of shortness of breath.  Oxygen saturation 80% on room air, per EMS and improved to 96% on a nonrebreather. ED Course: Afebrile.  Tachypneic.  WBC count 20.8, chronically elevated.  Potassium 3.3.  Initial high-sensitivity troponin negative, repeat pending.  BNP 357.  Blood culture x2 pending.  SARS-CoV-2 PCR test negative.  Influenza panel negative.  Chest x-ray showed cardiomegaly with pleural effusions and pulmonary edema. Patient received aspirin 324 mg, IV Lasix 40 mg, and potassium supplementation.  Assessment/Plan:  Principal Problem:   Acute exacerbation of CHF (congestive heart failure) (HCC) Active Problems:   Atrial fibrillation, chronic (HCC)   Mitral stenosis   Acute on chronic respiratory failure (HCC)   COPD (chronic obstructive pulmonary disease) (HCC)   Pressure injury of skin  Acute on chronic diastolic congestive heart failure. History of severe mitral stenosis with moderate mitral regurgitation, pulmonary hypertension.  Chest x-ray with pleural effusion and pulmonary edema.  Received IV Lasix yesterday but the subsequently had a hypotension.  2D echocardiogram done on 08/02/2019 showed LV strain fraction of 55 to 60%.  Will request cardiology consultation since patient complains of persistent dyspnea.  continue statin.  COPD / restrictive lung disease on 2 L of oxygen at baseline.  Currently on 4 L of  oxygen by nasal cannula.  Initially required nonrebreather in the ED.  We will continue to monitor  Left lateral foot ulceration.  Neuropathic ulcer.  Present on admission.  Seen by wound care.  Continue wound care.  Continue boot at this time.  X-ray of the foot did not show any evidence of osteomyelitis.  Persistent atrial fibrillation on Coumadin.  Pharmacy to dose Coumadin.  Rate is mostly controlled.  On metoprolol succinate at home.  Will resume if tolerated by blood pressure.  History of CVA .  Continue Coumadin.  No focal deficits.  Essential Hypertension.    Was hypotensive after IV Lasix yesterday. Hold Lasix with parameters.  Patient is on metoprolol at home.  Leukocytosis on presentation.  WBC of 18.3 today. Appears to be chronically elevated.  Follow blood cultures.  Abnormal urine analysis with WBC more than 50.  proCalcitonin at 0.8.  Check urine cultures.  Influenza was negative.  Covid test was negative.  Hypokalemia.  Mild.  Improved after replacement.  Potassium of 4.1 today.  Magnesium of 2.2.  Ambulatory dysfunction. Walks with walker at baseline. Lives with his wife at home.  Will get PT evaluation.  Stage II sacral pressure ulceration   Present on admission.  Continue wound care. Pressure Injury 08/02/19 Buttocks Right;Left Stage 2 -  Partial thickness loss of dermis presenting as a shallow open injury with a red, pink wound bed without slough. (Active)  08/02/19 1500  Location: Buttocks  Location Orientation: Right;Left  Staging: Stage 2 -  Partial thickness loss of dermis presenting as a shallow open injury with a red, pink wound bed without slough.  Wound Description (Comments):   Present on Admission: Yes  VTE Prophylaxis: Coumadin  Code Status: Full code.  Confirmed with family yesterday  Family Communication: Daughter Ms. [name] on the phone today and updated her about the clinical condition of the patient.  I informed that the prognosis of the  patient is not a good and she has fully understood that.  We will continue to monitor in hospital hopefully discharge home in 1 to 2 days if he remains stable.  Disposition Plan: likely Home with home health likely in 1 to 2 days. Will get PT evaluation.  Consult cardiology.  On 4 L of nasal canula, will wean as tolerated.  Consultants:  Cardiology  Procedures:  None  Antibiotics: . None  Anti-infectives (From admission, onward)   None     Subjective: Today, complains of dyspnea and sore throat.  Denies excessive cough or sputum production but has mild chest discomfort.  Denies any nausea, vomiting or abdominal pain.  Objective: Vitals:   08/03/19 0728 08/03/19 0936  BP: 116/65   Pulse: 98   Resp:    Temp: 97.8 F (36.6 C)   SpO2:  98%    Intake/Output Summary (Last 24 hours) at 08/03/2019 1049 Last data filed at 08/03/2019 0900 Gross per 24 hour  Intake 120 ml  Output 400 ml  Net -280 ml   Filed Weights   08/02/19 1852 08/03/19 0435  Weight: 59.2 kg 59.2 kg   Body mass index is 21.07 kg/m.   Physical Exam: GENERAL: Patient is alert awake and communicative.. Not in obvious distress.  Thinly built.  On Nasal cannula oxygen at 4 L/min. HENT: No scleral pallor or icterus. Pupils equally reactive to light. Oral mucosa is moist NECK: is supple, no obvious thyroid palpable. CHEST:   Diminished breath sounds bilaterally.  Crackles bilaterally. CVS: S1 and S2 heard, irregular rhythm.  ABDOMEN: Soft, non-tender, bowel sounds are present.  External urinary catheter in place. EXTREMITIES: Left leg with boot.  Edema+ CNS: Cranial nerves are intact.  Moves all extremities. SKIN: warm and dry, left lower extremity ulceration.  Sacral stage II ulceration.  Data Review: I have personally reviewed the following laboratory data and studies,  CBC: Recent Labs  Lab 08/02/19 0135 08/03/19 0212  WBC 20.8* 18.3*  NEUTROABS 18.0*  --   HGB 11.0* 8.8*  HCT 36.9* 29.0*  MCV  85.8 84.1  PLT 232 [id number]   Basic Metabolic Panel: Recent Labs  Lab 08/02/19 0135 08/02/19 0429 08/03/19 0212  NA 139  --  141  K 3.3*  --  4.1  CL 93*  --  94*  CO2 33*  --  33*  GLUCOSE 145*  --  107*  BUN 23  --  30*  CREATININE 1.02  --  1.08  CALCIUM 8.6*  --  8.6*  MG  --  2.0 2.2  PHOS  --   --  3.9   Liver Function Tests: No results for input(s): AST, ALT, ALKPHOS, BILITOT, PROT, ALBUMIN in the last 168 hours. No results for input(s): LIPASE, AMYLASE in the last 168 hours. No results for input(s): AMMONIA in the last 168 hours. Cardiac Enzymes: No results for input(s): CKTOTAL, CKMB, CKMBINDEX, TROPONINI in the last 168 hours. BNP (last 3 results) Recent Labs    08/02/19 0135  BNP 357.0*    ProBNP (last 3 results) No results for input(s): PROBNP in the last 8760 hours.  CBG: No results for input(s): GLUCAP in the last 168 hours. Recent Results (from the past 240 hour(s))  Respiratory Panel  by RT PCR (Flu A&B, Covid) - Nasopharyngeal Swab     Status: None   Collection Time: 08/02/19  1:43 AM   Specimen: Nasopharyngeal Swab  Result Value Ref Range Status   SARS Coronavirus 2 by RT PCR NEGATIVE NEGATIVE Final    Comment: (NOTE) SARS-CoV-2 target nucleic acids are NOT DETECTED. The SARS-CoV-2 RNA is generally detectable in upper respiratoy specimens during the acute phase of infection. The lowest concentration of SARS-CoV-2 viral copies this assay can detect is 131 copies/mL. A negative result does not preclude SARS-Cov-2 infection and should not be used as the sole basis for treatment or other patient management decisions. A negative result may occur with  improper specimen collection/handling, submission of specimen other than nasopharyngeal swab, presence of viral mutation(s) within the areas targeted by this assay, and inadequate number of viral copies (<131 copies/mL). A negative result must be combined with clinical observations, patient history, and  epidemiological information. The expected result is Negative. Fact Sheet for Patients:  PinkCheek.be Fact Sheet for Healthcare Providers:  GravelBags.it This test is not yet ap proved or cleared by the [place] FDA and  has been authorized for detection and/or diagnosis of SARS-CoV-2 by FDA under an Emergency Use Authorization (EUA). This EUA will remain  in effect (meaning this test can be used) for the duration of the COVID-19 declaration under Section 564(b)(1) of the Act, 21 U.S.C. section 360bbb-3(b)(1), unless the authorization is terminated or revoked sooner.    Influenza A by PCR NEGATIVE NEGATIVE Final   Influenza B by PCR NEGATIVE NEGATIVE Final    Comment: (NOTE) The Xpert Xpress SARS-CoV-2/FLU/RSV assay is intended as an aid in  the diagnosis of influenza from Nasopharyngeal swab specimens and  should not be used as a sole basis for treatment. Nasal washings and  aspirates are unacceptable for Xpert Xpress SARS-CoV-2/FLU/RSV  testing. Fact Sheet for Patients: PinkCheek.be Fact Sheet for Healthcare Providers: GravelBags.it This test is not yet approved or cleared by the [place] FDA and  has been authorized for detection and/or diagnosis of SARS-CoV-2 by  FDA under an Emergency Use Authorization (EUA). This EUA will remain  in effect (meaning this test can be used) for the duration of the  Covid-19 declaration under Section 564(b)(1) of the Act, 21  U.S.C. section 360bbb-3(b)(1), unless the authorization is  terminated or revoked. Performed at [facility], [place] [address], [place], [place] [address]      Studies: DG Chest Portable 1 View  Result Date: 08/02/2019 CLINICAL DATA:  Shortness of breath. Hypoxia. EXAM: PORTABLE CHEST 1 VIEW COMPARISON:  Radiograph 04/13/2019, 10/20/2017. FINDINGS: Chronic cardiomegaly, possibly increased from  prior. Aortic atherosclerosis. Moderate right and small left pleural effusion, slightly increased from prior exam. Adjacent bibasilar airspace disease favors atelectasis. Reticular opacities with increased septal thickening at the bases suspicious for pulmonary edema. No pneumothorax. Bones are diffusely under mineralized. IMPRESSION: 1. CHF pattern. Cardiomegaly with pleural effusions and pulmonary edema. Findings have progressed from 04/13/2019 radiograph. 2.  Aortic Atherosclerosis (ICD10-I70.0). Electronically Signed   By: Keith Rake M.D.   On: 08/02/2019 02:10   DG Foot 2 Views Left  Result Date: 08/02/2019 CLINICAL DATA:  Lateral foot ulcer at the metatarsal area. EXAM: LEFT FOOT - 2 VIEW COMPARISON:  None. FINDINGS: Regional arterial calcification. No plain radiographic evidence of osteomyelitis or pathologic fracture. Mild regional osteopenia. IMPRESSION: No plain radiographic sign of osteomyelitis.  No fracture. Electronically Signed   By: Jan Fireman.D.  On: 08/02/2019 05:57   ECHOCARDIOGRAM COMPLETE  Result Date: 08/02/2019   ECHOCARDIOGRAM REPORT   Patient Name:   Aaron Frye Date of Exam: 08/02/2019 Medical Rec #:  [id number]        Height:       67.0 in Accession #:    [id number]       Weight:       141.5 lb Date of Birth:  1924-07-06        BSA:          1.75 m Patient Age:    71 years         BP:           109/64 mmHg Patient Gender: M                HR:           86 bpm. Exam Location:  Inpatient Procedure: 2D Echo, 3D Echo, Color Doppler and Cardiac Doppler Indications:    [id number] Acute diastolic (congestive) heart failure, Mitral Valve                 Stenosis 105.0  History:        Patient has prior history of Echocardiogram examinations, most                 recent 07/18/2017. CHF, COPD, Arrythmias:Atrial Fibrillation;                 Risk Factors:Hypertension and Dyslipidemia.  Sonographer:    Raquel Sarna Senior RDCS Referring Phys: [id number] [place]  1. Left  ventricular ejection fraction, by visual estimation, is 55 to 60%. The left ventricle has normal function. There is no left ventricular hypertrophy.  2. The left ventricle has no regional wall motion abnormalities.  3. Global right ventricle was not well visualized.The right ventricular size is mildly enlarged. Right vetricular wall thickness was not assessed.  4. RV is difficult to see well Appears mildly dilated. Free wall is difficult to see to evaluate function adequately.  5. Left atrial size was severely dilated.  6. Right atrial size was severely dilated.  7. Large pleural effusion.  8. Moderate thickening of the mitral valve leaflet(s).  9. The mitral valve is abnormal. Mild to moderate mitral valve regurgitation. Mild mitral stenosis. 10. Mitral valve is thickened with restricted motion. Peak and mean gradients through the valve are 29 and 10 mm Hg respectively MVA by P T1/2 is approximately 1.5 cm 2 consistent with moderate MS. COmpared to report from January 2019 no significant change in mean gradient. 11. The tricuspid valve is normal in structure. 12. The tricuspid valve is normal in structure. Tricuspid valve regurgitation is moderate. 13. The aortic valve is abnormal. Aortic valve regurgitation is not visualized. 14. AV is thickened with restricted motion Peak and mean gradients are 15 and 7 mm Hg respectively LVOT/AV VTI ratio is 0.41 consistent with mild to moderate AS. 15. The pulmonic valve was grossly normal. Pulmonic valve regurgitation is not visualized. 16. Moderately elevated pulmonary artery systolic pressure. 17. The inferior vena cava is normal in size with greater than 50% respiratory variability, suggesting right atrial pressure of 3 mmHg. 18. The interatrial septum was not assessed. FINDINGS  Left Ventricle: Left ventricular ejection fraction, by visual estimation, is 55 to 60%. The left ventricle has normal function. The left ventricle has no regional wall motion abnormalities. The  left ventricular internal cavity size was the left ventricle is normal in size. There  is no left ventricular hypertrophy. Right Ventricle: The right ventricular size is mildly enlarged. Right vetricular wall thickness was not assessed. Global RV systolic function is was not well visualized. The tricuspid regurgitant velocity is 3.85 m/s, and with an assumed right atrial pressure of 8 mmHg, the estimated right ventricular systolic pressure is moderately elevated at 67.3 mmHg. RV is difficult to see well Appears mildly dilated. Free wall is difficult to see to evaluate function adequately. Left Atrium: Left atrial size was severely dilated. Right Atrium: Right atrial size was severely dilated Pericardium: There is no evidence of pericardial effusion. There is a large pleural effusion. Mitral Valve: The mitral valve is abnormal. There is moderate thickening of the mitral valve leaflet(s). Mildly decreased mobility of the mitral valve leaflets. Mild to moderate mitral valve regurgitation. Mild mitral valve stenosis by observation. MV peak gradient, 28.0 mmHg. Mitral valve is thickened with restricted motion. Peak and mean gradients through the valve are 29 and 10 mm Hg respectively MVA by P T1/2 is approximately 1.5 cm 2 consistent with moderate MS. COmpared to report from January 2019 no significant change in mean gradient. Tricuspid Valve: The tricuspid valve is normal in structure. Tricuspid valve regurgitation is moderate. Aortic Valve: The aortic valve is abnormal. Aortic valve regurgitation is not visualized. Aortic valve mean gradient measures 7.0 mmHg. AV is thickened with restricted motion Peak and mean gradients are 15 and 7 mm Hg respectively LVOT/AV VTI ratio is 0.41 consistent with mild to moderate AS. Pulmonic Valve: The pulmonic valve was grossly normal. Pulmonic valve regurgitation is not visualized. Pulmonic regurgitation is not visualized. Aorta: The aortic root is normal in size and structure. Venous:  The inferior vena cava is normal in size with greater than 50% respiratory variability, suggesting right atrial pressure of 3 mmHg. IAS/Shunts: The interatrial septum was not assessed.  LEFT VENTRICLE PLAX 2D LVIDd:         4.60 cm LVIDs:         3.20 cm LV PW:         0.70 cm LV IVS:        0.80 cm LVOT diam:     1.70 cm LV SV:         56 ml LV SV Index:   32.33 LVOT Area:     2.27 cm  RIGHT VENTRICLE RV S prime:     7.21 cm/s TAPSE (M-mode): 1.4 cm LEFT ATRIUM            Index       RIGHT ATRIUM           Index LA diam:      5.10 cm  2.92 cm/m  RA Area:     23.00 cm LA Vol (A2C): 81.0 ml  46.39 ml/m RA Volume:   70.40 ml  40.32 ml/m LA Vol (A4C): 108.0 ml 61.86 ml/m  AORTIC VALVE AV Mean Grad: 7.0 mmHg LVOT Vmax:    78.30 cm/s LVOT Vmean:   50.500 cm/s LVOT VTI:     0.159 m  AORTA Ao Root diam: 3.50 cm Ao Asc diam:  3.60 cm MITRAL VALVE               TRICUSPID VALVE MV Area (PHT): 1.69 cm    TR Peak grad:   59.3 mmHg MV Peak grad:  28.0 mmHg   TR Vmax:        385.00 cm/s MV Mean grad:  10.0 mmHg MV Vmax:  2.64 m/s    SHUNTS MV Vmean:      142.0 cm/s  Systemic VTI:  0.16 m MV VTI:        0.77 m      Systemic Diam: 1.70 cm MV PHT:        130.00 msec  Dorris Carnes MD Electronically signed by Dorris Carnes MD Signature Date/Time: 08/02/2019/5:09:34 PM    Final       Flora Lipps, MD  [facility] 08/03/2019

## 2019-08-03 NOTE — Progress Notes (Signed)
Initial Nutrition Assessment  RD working remotely.  DOCUMENTATION CODES:   Not applicable  INTERVENTION:   -MVI with minerals daily -Ensure Enlive po BID, each supplement provides 350 kcal and 20 grams of protein  -Magic cup BID with meals, each supplement provides 290 kcal and 9 grams of protein -Downgrade diet to dysphagia 3 (advanced mechanical soft) for ease of intake  NUTRITION DIAGNOSIS:   Increased nutrient needs related to wound healing as evidenced by estimated needs.  GOAL:   Patient will meet greater than or equal to 90% of their needs  MONITOR:   PO intake, Supplement acceptance, Diet advancement, Labs, Weight trends, Skin, I & O's  REASON FOR ASSESSMENT:   Low [place]    ASSESSMENT:   Aaron Frye is a 86 y.o. male with medical history significant of persistent atrial fibrillation on Coumadin, CVA, hypertension, hyperlipidemia, severe mitral stenosis with moderate mitral regurgitation, chronic diastolic congestive heart failure, pulmonary hypertension, COPD and restrictive lung disease due to kyphosis with chronic hypoxic respiratory failure on 2 L home oxygen presenting to the ED via EMS for evaluation of shortness of breath.  Oxygen saturation 80% on room air per EMS and improved to 96% on a nonrebreather.  Pt admitted with acute respiratory failure secondary to CHF exacerbation.   Reviewed I/O's: -320 ml x 24 hours and -470 ml since admission  UOP: 400 ml x 24 hours   Attempted to speak with pt via phone, however, no answer. Per chart review, pt is very hard of hearing.   Pt is on a heart healthy diet with nectar thick liquids. Noted meal completion 25% per doc flowsheets.   Reviewed wt hx; UBW is around 145#. Noted pt has experienced a 7.7% wt loss over the past 3 months, which is significant for time frame. Given weight loss, advanced age, decreased oral intake and increased nutritional needs in the presence of wounds, highly suspect pt with  malnutrition, however, RD unable to identify at this time. Pt would greatly benefit from addition of oral nutritional supplements.   Lab Results  Component Value Date   HGBA1C 6.6 (H) 03/30/2019   PTA DM medications are none. .Per ADA's Standards of Medical Care for Diabetes, glycemic targets for elderly patients who are otherwise healthy (few medical impairments) and cognitively intact should be less stringent (Hgb A1c <7.5).    Labs reviewed.   Diet Order:   Diet Order            Diet Heart Room service appropriate? Yes; Fluid consistency: Nectar Thick; Fluid restriction: 1200 mL Fluid  Diet effective now              EDUCATION NEEDS:   No education needs have been identified at this time  Skin:  Skin Assessment: Skin Integrity Issues: Skin Integrity Issues:: Stage II, Other (Comment), Diabetic Ulcer Stage II: buttocks Diabetic Ulcer: neuropathic ulcer to lt lateral foot (full thickness) Other: lt leg blister, rt pretibial laceration  Last BM:  Unknown  Height:   Ht Readings from Last 1 Encounters:  08/02/19 5\' 6"  (1.676 m)    Weight:   Wt Readings from Last 1 Encounters:  08/03/19 59.2 kg    Ideal Body Weight:  64.5 kg  BMI:  Body mass index is 21.07 kg/m.  Estimated Nutritional Needs:   Kcal:  1600-1800  Protein:  75-90 grams  Fluid:  1.2 L    Cristino Degroff A. Jimmye Norman, RD, LDN, [place] Registered Dietitian II Certified Diabetes Care and Education Specialist  Pager: [phone] After hours Pager: [phone]

## 2019-08-03 NOTE — Consult Note (Addendum)
Cardiology Consultation:   Patient ID: EBER HAWK MRN: [id number]; DOB: 1924-09-10  Admit date: 08/02/2019 Date of Consult: 08/03/2019  Primary Care Provider: Nickola Major, MD Primary Cardiologist: Jenkins Rouge, MD  Primary Electrophysiologist:  None   Patient Profile:   Aaron Frye is a 86 y.o. 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 Louanne Belton.  History of Present Illness:   He is followed by Dr. Johnsie Cancel. He has a remote history of decreased EF in 2009 at 35-40% although in recent years, LVEF has been normal. He last required admission in 09/2017 for CHF. ACE was held for low blood pressure. He was not felt to be a surgical candidate for mitral valve disease due to age (previously severe MS and mod-severe MR).   He presented to the ED yesterday via EMS with complaints of SOB and tachypnea, found to be hypoxic at 80% requiring NRB. He is a poor historian and very vague with answers. When asked about how long he has been SOB, he says he doesn't know. He had some chest discomfort that came and went but unable to tell me when, how long or what it felt like. He has had some orthopnea and PND as well as edema. Workup revealed leukocytosis (WBC 20.8k but chronically elevated), resp panel negative, CXR with pleural effusions and pulmonary edema. 2D echo 08/02/19 EF 55-60%, no LVH, mildly dilated RV, severely dilated LA/RA, mild-moderate MR and moderate MS, moderate TR, mild-moderate AS, moderately elevated pulmonary artery pressure. hsTroponin 13->18. He is also found to have a neuropathic left lateral foot ulcer (x-ray without  osteomelitis), supratherapeutic INR, stage II sacral pressure ulcer. He has been treated with IV Lasix. He reports he does not feel any better, still SOB, but his oxygenation has improved to 98% on 4L (uses 2L at home). Reports a chronic cough, sometimes productive of clear sputum. No fevers or chills. I/Os -350 and no sig weight change yet.    Past Medical History:  Diagnosis Date  . Anemia   . Atrial fibrillation ([place])    Persisent   . Carotid artery disease ([place])   . Chronic atrial fibrillation ([place])   . Chronic combined systolic and diastolic CHF (congestive heart failure) ([place])   . Chronic respiratory failure ([place])   . COPD (chronic obstructive pulmonary disease) ([place])   . CVA (cerebral vascular accident) ([place])   . Femur fracture, right [facility]) March 2016  . HLD (hyperlipidemia)   . HTN (hypertension)   . Mitral regurgitation   . Mitral stenosis   . Non-ischemic cardiomyopathy ([place])    LVEF=35-40% by echo 2009  . Prostate cancer ([place])   . Pulmonary hypertension ([place])   . TIA (transient ischemic attack)   . Venous stasis   . Venous stasis ulcer ([place])     Past Surgical History:  Procedure Laterality Date  . APPENDECTOMY    . FEMUR IM NAIL Left 04/01/2019   Procedure: INTRAMEDULLARY (IM) NAIL FEMORAL;  Surgeon: Rod Can, MD;  Location: [place];  Service: Orthopedics;  Laterality: Left;  . INTRAMEDULLARY (IM) NAIL INTERTROCHANTERIC Right 09/24/2014   Procedure: INTRAMEDULLARY (IM) NAIL INTERTROCHANTRIC;  Surgeon: Rod Can, MD;  Location: [place];  Service: Orthopedics;  Laterality: Right;  . TONSILLECTOMY       Home Medications:  Prior to Admission medications   Medication Sig Start Date End Date Taking? Authorizing Provider  acetaminophen (TYLENOL) 325 MG tablet Take 325 mg by mouth 2 (two) times daily as needed (for headaches).   Yes [provider]  atorvastatin (LIPITOR) 40 MG tablet Take 1 tablet by mouth once daily Patient taking differently: Take 40 mg  by mouth daily at 6 PM.  01/29/19  Yes Josue Hector, MD  furosemide (LASIX) 20 MG tablet TAKE 3 TABLETS BY MOUTH TWICE A DAY Patient taking differently: Take 20 mg by mouth 2 (two) times daily.  06/17/19  Yes Josue Hector, MD  metoprolol succinate (TOPROL-XL) 25 MG 24 hr tablet Take 3 tablets by mouth once daily Patient taking differently: Take 75 mg by mouth daily.  02/05/19  Yes Josue Hector, MD  Multiple Vitamins-Minerals (ADULT ONE DAILY GUMMIES) CHEW Chew 1 tablet by mouth daily.   Yes [provider]  warfarin (COUMADIN) 5 MG tablet Take 1 tablet daily or as directed by Coumadin clinic Patient taking differently: Take 2.5 mg by mouth daily at 6 PM.  02/05/19  Yes Josue Hector, MD  OXYGEN Inhale 2 L into the lungs continuous.    [provider]    Inpatient Medications: Scheduled Meds: . atorvastatin  40 mg Oral q1800  . collagenase   Topical Daily  . feeding supplement (ENSURE ENLIVE)  237 mL Oral BID BM  . furosemide  40 mg Intravenous BID  . metoprolol succinate  75 mg Oral Daily  . multivitamin with minerals  1 tablet Oral Daily  . sodium chloride flush  3 mL Intravenous Q12H  . Warfarin - Pharmacist Dosing Inpatient   Does not apply q1800   Continuous Infusions: . sodium chloride     PRN Meds: sodium chloride, acetaminophen, ipratropium-albuterol, Resource ThickenUp Clear, sodium chloride flush  Allergies:    Allergies  Allergen Reactions  . Penicillins Rash and Other (See Comments)    Has patient had a PCN reaction causing immediate rash, facial/tongue/throat swelling, SOB or lightheadedness with hypotension: Yes Has patient had a PCN reaction causing severe rash involving mucus membranes or skin necrosis: Unk Has patient had a PCN reaction that required hospitalization: No Has patient had a PCN reaction occurring within the last 10 years: No If all of the above answers are "NO", then may proceed with Cephalosporin use.     Social History:     Social History   Socioeconomic History  . Marital status: Married    Spouse name: Not on file  . Number of children: Not on file  . Years of education: Not on file  . Highest education level: Not on file  Occupational History  . 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 Smoking status: Never Smoker  . Smokeless tobacco: Never Used  Substance and Sexual Activity  . Alcohol use: No  . Drug use: No  . Sexual activity: Not on file  Other Topics Concern  . Not on file  Social History Narrative  . Not on file   Social Determinants of Health   Financial Resource Strain:   . Difficulty of Paying Living Expenses: Not on file  Food Insecurity:   . Worried About Charity fundraiser in the Last Year: Not on file  . Ran Out of Food in the Last Year: Not on file  Transportation Needs:   .  Lack of Transportation (Medical): Not on file  . Lack of Transportation (Non-Medical): Not on file  Physical Activity:   . Days of Exercise per Week: Not on file  . Minutes of Exercise per Session: Not on file  Stress:   . Feeling of Stress : Not on file  Social Connections:   . Frequency of Communication with Friends and Family: Not on file  . Frequency of Social Gatherings with Friends and Family: Not on file  . Attends Religious Services: Not on file  . Active Member of Clubs or Organizations: Not on file  . Attends Archivist Meetings: Not on file  . Marital Status: Not on file  Intimate Partner Violence:   . Fear of Current or Ex-Partner: Not on file  . Emotionally Abused: Not on file  . Physically Abused: Not on file  . Sexually Abused: Not on file    Family History:    Family History  Problem Relation Age of Onset  . Lung disease Father   . Heart failure Mother   . Cancer Brother        Not sure which type of cancer  . Heart attack Brother   . Hypertension Son   . Stroke Neg Hx      ROS:  Please see the history of present illness.   All other ROS reviewed and  negative.     Physical Exam/Data:   Vitals:   08/03/19 0435 08/03/19 0728 08/03/19 0936 08/03/19 1132  BP: 111/69 116/65  125/67  Pulse: 87 98  78  Resp: (!) 22     Temp: 97.8 F (36.6 C) 97.8 F (36.6 C)  (!) 97.5 F (36.4 C)  TempSrc: Oral Oral  Oral  SpO2: 97%  98% 94%  Weight: 59.2 kg     Height:        Intake/Output Summary (Last 24 hours) at 08/03/2019 1134 Last data filed at 08/03/2019 0900 Gross per 24 hour  Intake 120 ml  Output 400 ml  Net -280 ml   Last 3 Weights 08/03/2019 08/02/2019 04/13/2019  Weight (lbs) 130 lb 8.6 oz 130 lb 8.2 oz 141 lb 8.6 oz  Weight (kg) 59.21 kg 59.2 kg 64.2 kg  General: Chronically frail appearing elderly WM in no acute distress. Head: Normocephalic, atraumatic, sclera non-icteric, no xanthomas, nares are without discharge. Neck: Negative for carotid bruits. JVP not elevated. Lungs: Diminished throughout with occasional expiratory wheezing. Breathing is unlabored. Dry tickly cough noted Heart: Irregularly irregular, rate controlled, S1 S2, 2/6 SEM heard best at LSB. No rubs or gallops.  Abdomen: Soft, non-tender, non-distended with normoactive bowel sounds. No rebound/guarding. Extremities: Chronic venous stasis hyperpigmentation bilaterally without significant edema Neuro: Alert and oriented X 3. Moves all extremities spontaneously. Very hard of hearing Psych:  Flat affect  EKG:  The EKG was personally reviewed and demonstrates: atrial fibrillation 96bpm, right axis deviation, low votlage extremity leads, subtle ST depression avF, V3-V6 ? Subtle J point elevation I, avL  -> suspect this was due to lead placement issue as repeat EKG does not have these changes/axis  Relevant CV Studies: 2D echo 08/02/19 IMPRESSIONS  1. Left ventricular ejection fraction, by visual estimation, is 55 to  60%. The left ventricle has normal function. There is no left ventricular  hypertrophy.  2. The left ventricle has no regional wall motion abnormalities.    3. Global right ventricle was not well visualized.The right ventricular  size is mildly enlarged. Right vetricular wall thickness was not  assessed.  4. RV is difficult to see well Appears mildly dilated. Free wall is  difficult to see to evaluate function adequately.  5. Left atrial size was severely dilated.  6. Right atrial size was severely dilated.  7. Large pleural effusion.  8. Moderate thickening of the mitral valve leaflet(s).  9. The mitral valve is abnormal. Mild to moderate mitral valve  regurgitation. Mild mitral stenosis.  10. Mitral valve is thickened with restricted motion. Peak and mean  gradients through the valve are 29 and 10 mm Hg respectively MVA by P T1/2  is approximately 1.5 cm 2 consistent with moderate MS. COmpared to report  from January 2019 no significant  change in mean gradient.  11. The tricuspid valve is normal in structure.  12. The tricuspid valve is normal in structure. Tricuspid valve  regurgitation is moderate.  13. The aortic valve is abnormal. Aortic valve regurgitation is not  visualized.  14. AV is thickened with restricted motion Peak and mean gradients are 15  and 7 mm Hg respectively LVOT/AV VTI ratio is 0.41 consistent with mild to  moderate AS.  15. The pulmonic valve was grossly normal. Pulmonic valve regurgitation is  not visualized.  16. Moderately elevated pulmonary artery systolic pressure.  17. The inferior vena cava is normal in size with greater than 50%  respiratory variability, suggesting right atrial pressure of 3 mmHg.  18. The interatrial septum was not assessed.   Laboratory Data:  High Sensitivity Troponin:   Recent Labs  Lab 08/02/19 0135 08/02/19 0354  TROPONINIHS 13 18*     Chemistry Recent Labs  Lab 08/02/19 0135 08/03/19 0212  NA 139 141  K 3.3* 4.1  CL 93* 94*  CO2 33* 33*  GLUCOSE 145* 107*  BUN 23 30*  CREATININE 1.02 1.08  CALCIUM 8.6* 8.6*  GFRNONAA >60 59*  GFRAA >60 >60  ANIONGAP  13 14    No results for input(s): PROT, ALBUMIN, AST, ALT, ALKPHOS, BILITOT in the last 168 hours. Hematology Recent Labs  Lab 08/02/19 0135 08/03/19 0212  WBC 20.8* 18.3*  RBC 4.30 3.45*  HGB 11.0* 8.8*  HCT 36.9* 29.0*  MCV 85.8 84.1  MCH 25.6* 25.5*  MCHC 29.8* 30.3  RDW 18.5* 18.3*  PLT 232 186   BNP Recent Labs  Lab 08/02/19 0135  BNP 357.0*    DDimer No results for input(s): DDIMER in the last 168 hours.   Radiology/Studies:  DG Chest Portable 1 View  Result Date: 08/02/2019 CLINICAL DATA:  Shortness of breath. Hypoxia. EXAM: PORTABLE CHEST 1 VIEW COMPARISON:  Radiograph 04/13/2019, 10/20/2017. FINDINGS: Chronic cardiomegaly, possibly increased from prior. Aortic atherosclerosis. Moderate right and small left pleural effusion, slightly increased from prior exam. Adjacent bibasilar airspace disease favors atelectasis. Reticular opacities with increased septal thickening at the bases suspicious for pulmonary edema. No pneumothorax. Bones are diffusely under mineralized. IMPRESSION: 1. CHF pattern. Cardiomegaly with pleural effusions and pulmonary edema. Findings have progressed from 04/13/2019 radiograph. 2.  Aortic Atherosclerosis (ICD10-I70.0). Electronically Signed   By: Keith Rake M.D.   On: 08/02/2019 02:10   DG Foot 2 Views Left  Result Date: 08/02/2019 CLINICAL DATA:  Lateral foot ulcer at the metatarsal area. EXAM: LEFT FOOT - 2 VIEW COMPARISON:  None. FINDINGS: Regional arterial calcification. No plain radiographic evidence of osteomyelitis or pathologic fracture. Mild regional osteopenia. IMPRESSION: No plain radiographic sign of osteomyelitis.  No fracture. Electronically Signed   By: Nelson Chimes M.D.   On: 08/02/2019 05:57  ECHOCARDIOGRAM COMPLETE  Result Date: 08/02/2019   ECHOCARDIOGRAM REPORT   Patient Name:   GOLDEN MULDERIG Date of Exam: 08/02/2019 Medical Rec #:  [id number]        Height:       67.0 in Accession #:    [id number]       Weight:        141.5 lb Date of Birth:  June 30, 1925        BSA:          1.75 m Patient Age:    42 years         BP:           109/64 mmHg Patient Gender: M                HR:           86 bpm. Exam Location:  Inpatient Procedure: 2D Echo, 3D Echo, Color Doppler and Cardiac Doppler Indications:    [id number] Acute diastolic (congestive) heart failure, Mitral Valve                 Stenosis 105.0  History:        Patient has prior history of Echocardiogram examinations, most                 recent 07/18/2017. CHF, COPD, Arrythmias:Atrial Fibrillation;                 Risk Factors:Hypertension and Dyslipidemia.  Sonographer:    Raquel Sarna Senior RDCS Referring Phys: [id number] [place]  1. Left ventricular ejection fraction, by visual estimation, is 55 to 60%. The left ventricle has normal function. There is no left ventricular hypertrophy.  2. The left ventricle has no regional wall motion abnormalities.  3. Global right ventricle was not well visualized.The right ventricular size is mildly enlarged. Right vetricular wall thickness was not assessed.  4. RV is difficult to see well Appears mildly dilated. Free wall is difficult to see to evaluate function adequately.  5. Left atrial size was severely dilated.  6. Right atrial size was severely dilated.  7. Large pleural effusion.  8. Moderate thickening of the mitral valve leaflet(s).  9. The mitral valve is abnormal. Mild to moderate mitral valve regurgitation. Mild mitral stenosis. 10. Mitral valve is thickened with restricted motion. Peak and mean gradients through the valve are 29 and 10 mm Hg respectively MVA by P T1/2 is approximately 1.5 cm 2 consistent with moderate MS. COmpared to report from January 2019 no significant change in mean gradient. 11. The tricuspid valve is normal in structure. 12. The tricuspid valve is normal in structure. Tricuspid valve regurgitation is moderate. 13. The aortic valve is abnormal. Aortic valve regurgitation is not visualized. 14. AV  is thickened with restricted motion Peak and mean gradients are 15 and 7 mm Hg respectively LVOT/AV VTI ratio is 0.41 consistent with mild to moderate AS. 15. The pulmonic valve was grossly normal. Pulmonic valve regurgitation is not visualized. 16. Moderately elevated pulmonary artery systolic pressure. 17. The inferior vena cava is normal in size with greater than 50% respiratory variability, suggesting right atrial pressure of 3 mmHg. 18. The interatrial septum was not assessed. FINDINGS  Left Ventricle: Left ventricular ejection fraction, by visual estimation, is 55 to 60%. The left ventricle has normal function. The left ventricle has no regional wall motion abnormalities. The left ventricular internal cavity size was the left ventricle is normal in size. There is no left ventricular hypertrophy.  Right Ventricle: The right ventricular size is mildly enlarged. Right vetricular wall thickness was not assessed. Global RV systolic function is was not well visualized. The tricuspid regurgitant velocity is 3.85 m/s, and with an assumed right atrial pressure of 8 mmHg, the estimated right ventricular systolic pressure is moderately elevated at 67.3 mmHg. RV is difficult to see well Appears mildly dilated. Free wall is difficult to see to evaluate function adequately. Left Atrium: Left atrial size was severely dilated. Right Atrium: Right atrial size was severely dilated Pericardium: There is no evidence of pericardial effusion. There is a large pleural effusion. Mitral Valve: The mitral valve is abnormal. There is moderate thickening of the mitral valve leaflet(s). Mildly decreased mobility of the mitral valve leaflets. Mild to moderate mitral valve regurgitation. Mild mitral valve stenosis by observation. MV peak gradient, 28.0 mmHg. Mitral valve is thickened with restricted motion. Peak and mean gradients through the valve are 29 and 10 mm Hg respectively MVA by P T1/2 is approximately 1.5 cm 2 consistent with  moderate MS. COmpared to report from January 2019 no significant change in mean gradient. Tricuspid Valve: The tricuspid valve is normal in structure. Tricuspid valve regurgitation is moderate. Aortic Valve: The aortic valve is abnormal. Aortic valve regurgitation is not visualized. Aortic valve mean gradient measures 7.0 mmHg. AV is thickened with restricted motion Peak and mean gradients are 15 and 7 mm Hg respectively LVOT/AV VTI ratio is 0.41 consistent with mild to moderate AS. Pulmonic Valve: The pulmonic valve was grossly normal. Pulmonic valve regurgitation is not visualized. Pulmonic regurgitation is not visualized. Aorta: The aortic root is normal in size and structure. Venous: The inferior vena cava is normal in size with greater than 50% respiratory variability, suggesting right atrial pressure of 3 mmHg. IAS/Shunts: The interatrial septum was not assessed.  LEFT VENTRICLE PLAX 2D LVIDd:         4.60 cm LVIDs:         3.20 cm LV PW:         0.70 cm LV IVS:        0.80 cm LVOT diam:     1.70 cm LV SV:         56 ml LV SV Index:   32.33 LVOT Area:     2.27 cm  RIGHT VENTRICLE RV S prime:     7.21 cm/s TAPSE (M-mode): 1.4 cm LEFT ATRIUM            Index       RIGHT ATRIUM           Index LA diam:      5.10 cm  2.92 cm/m  RA Area:     23.00 cm LA Vol (A2C): 81.0 ml  46.39 ml/m RA Volume:   70.40 ml  40.32 ml/m LA Vol (A4C): 108.0 ml 61.86 ml/m  AORTIC VALVE AV Mean Grad: 7.0 mmHg LVOT Vmax:    78.30 cm/s LVOT Vmean:   50.500 cm/s LVOT VTI:     0.159 m  AORTA Ao Root diam: 3.50 cm Ao Asc diam:  3.60 cm MITRAL VALVE               TRICUSPID VALVE MV Area (PHT): 1.69 cm    TR Peak grad:   59.3 mmHg MV Peak grad:  28.0 mmHg   TR Vmax:        385.00 cm/s MV Mean grad:  10.0 mmHg MV Vmax:       2.64 m/s  SHUNTS MV Vmean:      142.0 cm/s  Systemic VTI:  0.16 m MV VTI:        0.77 m      Systemic Diam: 1.70 cm MV PHT:        130.00 msec  Dorris Carnes MD Electronically signed by Dorris Carnes MD Signature  Date/Time: 08/02/2019/5:09:34 PM    Final    { Assessment and Plan:   1. Acute on chronic hypoxic respiratory failure suspected multifactorial but with suspected acute on chronic diastolic CHF - receiving IV Lasix with improvement in oxygenation, but has not had significant improvement in dyspnea yet. He also had a vague episode of chest discomfort but is unable to provide further details. hsTroponin is low/flat, arguing against ACS. His weight has fallen below prior outpatient weights. Breath sounds are diminished throughout. Will discuss further regimen with MD.  2. COPD - continue nebs. Consider pulmonary consultation if dyspnea persists despite treatment of CHF.  3. Valvular heart disease - echo as above with mild-moderate MR, moderate mitral stenosis, moderate TR, mild-moderate AS.  4. CKD stage II - Cr appears stable compared to prior.  5. Acute on chronic anemia - Hgb appears lower than previous value, 8.8 today. Need to trend since on Coumadin. Further per IM.  6. Chronic atrial fib - rates 80s-90s, on Coumadin for anticoagulation. CHADSVASC 7 for CHF, HTN, age, stroke, vascular disease.    7. Weight loss - patient was previously 180s several years ago with progressive decline since then. Check thyroid in AM. Dietitian following.  For questions or updates, please contact [place] Please consult [email or web address] for contact info under     Signed, Charlie Pitter, PA-C  08/03/2019 11:34 AM    Attending Note:   The patient was seen and examined.  Agree with assessment and plan as noted above.  Changes made to the above note as needed.  Patient seen and independently examined with  Melina Copa, PA .   We discussed all aspects of the encounter. I agree with the assessment and plan as stated above.  1.   Acute on chronic shortness of breath: Patient has multiple reasons for shortness of breath.  He is in chronic atrial fibrillation.  He has chronic diastolic CHF.  He has mixed  mitral stenosis and mitral regurgitation as well as aortic stenosis.  He also has COPD and is on home oxygen.  He is diuresed some but really does not feel any better. On exam he has consolidation particularly in his right side and to a lesser degree on his left side.  The chest x-ray shows bilateral pleural effusions right much greater than left.  I suspect that he has chronic aspiration which would explain his elevated white count and he has progressive decline that is not responsive to diuretics.  A swallowing study might help [place] in determining if that is the case. I would continue with current medications.  He is not a candidate for any further invasive cardiology procedures.  He is currently a full code but I suspect that that issue needs to be readdressed at some point.  2.  Mitral stenosis: This appears to be basically unchanged.  3.  Aortic stenosis: No significant change from previous echoes.  Continue current medications.   I have spent a total of 40 minutes with patient reviewing hospital  notes , telemetry, EKGs, labs and examining patient as well as establishing an assessment and plan that was discussed with the patient. >  50% of time was spent in direct patient care.    Thayer Headings, Brooke Bonito., MD, [facility] 08/03/2019, 1:22 PM [address]  [place] Pager [phone]

## 2019-08-03 NOTE — Evaluation (Signed)
Physical Therapy Evaluation Patient Details Name: Aaron Frye MRN: [id number] DOB: 26-Jan-1925 Today's Date: 08/03/2019   History of Present Illness  Patient is a 86 year old male admitted from home with SOB, LE swelling.  PMH includes: Lfemur fx, afib, CVA, HTN, cardiomyopathy  Clinical Impression  Patient received in bed, daughter present for session. Patient is very [place]. Daughter reports he will go home at discharge has 24 hour care. Patient required min assist with bed mobility, min assist for transfers. Ambulated 20 feet with RW and min guard. Slow labored pace. He required seated rest after 10 feet due to fatigue and SOB. Patient will continue to benefit from skilled PT while here to improve strength and functional independence.       Follow Up Recommendations Home health PT    Equipment Recommendations  None recommended by PT    Recommendations for Other Services       Precautions / Restrictions Precautions Precautions: Fall Restrictions Weight Bearing Restrictions: No      Mobility  Bed Mobility Overal bed mobility: Needs Assistance Bed Mobility: Supine to Sit;Sit to Supine     Supine to sit: Min assist;HOB elevated Sit to supine: Min assist;HOB elevated      Transfers Overall transfer level: Needs assistance Equipment used: Rolling walker (2 wheeled) Transfers: Sit to/from Stand Sit to Stand: Min assist         General transfer comment: patient requires assist to stand from bed/chair  Ambulation/Gait Ambulation/Gait assistance: Min guard Gait Distance (Feet): 20 Feet Assistive device: Rolling walker (2 wheeled) Gait Pattern/deviations: Step-to pattern;Decreased stride length;Trunk flexed Gait velocity: decreased   General Gait Details: slow, labored, required seated rest after ambulating 10 feet due to fatigue and shortness of breath  Stairs            Wheelchair Mobility    Modified Rankin (Stroke Patients Only)       Balance  Overall balance assessment: Needs assistance Sitting-balance support: Feet supported;Bilateral upper extremity supported Sitting balance-Leahy Scale: Fair     Standing balance support: Bilateral upper extremity supported;During functional activity Standing balance-Leahy Scale: Fair Standing balance comment: reliant on RW                             Pertinent Vitals/Pain Pain Assessment: No/denies pain    Home Living Family/patient expects to be discharged to:: Private residence Living Arrangements: Spouse/significant other;Other (Comment)(PCA 24 hours a day) Available Help at Discharge: Personal care attendant;Home health;Available 24 hours/day Type of Home: House Home Access: Ramped entrance     Home Layout: One level Home Equipment: Walker - 2 wheels;Shower seat Additional Comments: Patient has 24 hour a day caregivers, was getting PT/OT at home pta.    Prior Function Level of Independence: Independent with assistive device(s)         Comments: Patient was doing well prior to admission, ambulating with walker, pretty independent per daughter.     Hand Dominance   Dominant Hand: Right    Extremity/Trunk Assessment   Upper Extremity Assessment Upper Extremity Assessment: Generalized weakness    Lower Extremity Assessment Lower Extremity Assessment: Generalized weakness       Communication   Communication: HOH  Cognition Arousal/Alertness: Awake/alert Behavior During Therapy: WFL for tasks assessed/performed Overall Cognitive Status: Within Functional Limits for tasks assessed  General Comments      Exercises     Assessment/Plan    PT Assessment Patient needs continued PT services  PT Problem List Decreased strength;Decreased mobility;Decreased activity tolerance;Decreased balance;Cardiopulmonary status limiting activity       PT Treatment Interventions Therapeutic exercise;Gait  training;Functional mobility training;Therapeutic activities;Balance training;Patient/family education    PT Goals (Current goals can be found in the Care Plan section)  Acute Rehab PT Goals Patient Stated Goal: to return home PT Goal Formulation: With patient/family Time For Goal Achievement: 08/17/19 Potential to Achieve Goals: Good    Frequency Min 3X/week   Barriers to discharge        Co-evaluation               AM-PAC PT "6 Clicks" Mobility  Outcome Measure Help needed turning from your back to your side while in a flat bed without using bedrails?: A Little Help needed moving from lying on your back to sitting on the side of a flat bed without using bedrails?: A Little Help needed moving to and from a bed to a chair (including a wheelchair)?: A Little Help needed standing up from a chair using your arms (e.g., wheelchair or bedside chair)?: A Little Help needed to walk in hospital room?: A Lot Help needed climbing 3-5 steps with a railing? : A Lot 6 Click Score: 16    End of Session Equipment Utilized During Treatment: Gait belt;Oxygen Activity Tolerance: Patient limited by fatigue;Other (comment)(limited by sob) Patient left: in bed;with call bell/phone within reach;with bed alarm set;with family/visitor present Nurse Communication: Mobility status PT Visit Diagnosis: Other abnormalities of gait and mobility (R26.89);Muscle weakness (generalized) (M62.81);History of falling (Z91.81);Difficulty in walking, not elsewhere classified (R26.2)    Time: [id number] PT Time Calculation (min) (ACUTE ONLY): 33 min   Charges:   PT Evaluation $PT Eval Moderate Complexity: 1 Mod PT Treatments $Gait Training: 23-37 mins        Valoree Agent, PT, GCS 08/03/19,3:24 PM

## 2019-08-04 ENCOUNTER — Inpatient Hospital Stay (HOSPITAL_COMMUNITY): Payer: Medicare Other

## 2019-08-04 DIAGNOSIS — I5033 Acute on chronic diastolic (congestive) heart failure: Secondary | ICD-10-CM

## 2019-08-04 DIAGNOSIS — J90 Pleural effusion, not elsewhere classified: Secondary | ICD-10-CM

## 2019-08-04 DIAGNOSIS — I5023 Acute on chronic systolic (congestive) heart failure: Secondary | ICD-10-CM

## 2019-08-04 DIAGNOSIS — J690 Pneumonitis due to inhalation of food and vomit: Secondary | ICD-10-CM

## 2019-08-04 LAB — PROTEIN, PLEURAL OR PERITONEAL FLUID
Total protein, fluid: <3 g/dL
Total protein, fluid: <3 g/dL

## 2019-08-04 LAB — CBC
HCT: 31.1 % — ABNORMAL LOW (ref 39.0–52.0)
Hemoglobin: 9.5 g/dL — ABNORMAL LOW (ref 13.0–17.0)
MCH: 25.8 pg — ABNORMAL LOW (ref 26.0–34.0)
MCHC: 30.5 g/dL (ref 30.0–36.0)
MCV: 84.5 fL (ref 80.0–100.0)
Platelets: 221 10*3/uL (ref 150–400)
RBC: 3.68 MIL/uL — ABNORMAL LOW (ref 4.22–5.81)
RDW: 18.6 % — ABNORMAL HIGH (ref 11.5–15.5)
WBC: 17.5 10*3/uL — ABNORMAL HIGH (ref 4.0–10.5)
nRBC: 0 % (ref 0.0–0.2)

## 2019-08-04 LAB — LACTATE DEHYDROGENASE, PLEURAL OR PERITONEAL FLUID
LD, Fluid: 66 U/L — ABNORMAL HIGH (ref 3–23)
LD, Fluid: 91 U/L — ABNORMAL HIGH (ref 3–23)

## 2019-08-04 LAB — BODY FLUID CELL COUNT WITH DIFFERENTIAL
Eos, Fluid: 0 %
Eos, Fluid: 0 %
Lymphs, Fluid: 40 %
Lymphs, Fluid: 10 %
Monocyte-Macrophage-Serous Fluid: 7 % — ABNORMAL LOW (ref 50–90)
Monocyte-Macrophage-Serous Fluid: 8 % — ABNORMAL LOW (ref 50–90)
Neutrophil Count, Fluid: 53 % — ABNORMAL HIGH (ref 0–25)
Neutrophil Count, Fluid: 82 % — ABNORMAL HIGH (ref 0–25)
Total Nucleated Cell Count, Fluid: 425 cu mm (ref 0–1000)
Total Nucleated Cell Count, Fluid: 575 cu mm (ref 0–1000)

## 2019-08-04 LAB — COMPREHENSIVE METABOLIC PANEL
ALT: 16 U/L (ref 0–44)
AST: 18 U/L (ref 15–41)
Albumin: 2.2 g/dL — ABNORMAL LOW (ref 3.5–5.0)
Alkaline Phosphatase: 81 U/L (ref 38–126)
Anion gap: 11 (ref 5–15)
BUN: 37 mg/dL — ABNORMAL HIGH (ref 8–23)
CO2: 37 mmol/L — ABNORMAL HIGH (ref 22–32)
Calcium: 9 mg/dL (ref 8.9–10.3)
Chloride: 94 mmol/L — ABNORMAL LOW (ref 98–111)
Creatinine, Ser: 1.29 mg/dL — ABNORMAL HIGH (ref 0.61–1.24)
GFR calc Af Amer: 55 mL/min — ABNORMAL LOW (ref >60)
GFR calc non Af Amer: 48 mL/min — ABNORMAL LOW (ref >60)
Glucose, Bld: 117 mg/dL — ABNORMAL HIGH (ref 70–99)
Potassium: 4.4 mmol/L (ref 3.5–5.1)
Sodium: 142 mmol/L (ref 135–145)
Total Bilirubin: 1.4 mg/dL — ABNORMAL HIGH (ref 0.3–1.2)
Total Protein: 6.8 g/dL (ref 6.5–8.1)

## 2019-08-04 LAB — PROTIME-INR
INR: 2.5 — ABNORMAL HIGH (ref 0.8–1.2)
Prothrombin Time: 27.1 seconds — ABNORMAL HIGH (ref 11.4–15.2)

## 2019-08-04 LAB — CHOLESTEROL, TOTAL: Cholesterol: 100 mg/dL (ref 0–200)

## 2019-08-04 LAB — PROTEIN, TOTAL: Total Protein: 6.8 g/dL (ref 6.5–8.1)

## 2019-08-04 LAB — LACTATE DEHYDROGENASE: LDH: 185 U/L (ref 98–192)

## 2019-08-04 LAB — TSH: TSH: 0.781 u[IU]/mL (ref 0.350–4.500)

## 2019-08-04 LAB — T4, FREE: Free T4: 1 ng/dL (ref 0.61–1.12)

## 2019-08-04 MED ORDER — WARFARIN SODIUM 2.5 MG PO TABS
2.5000 mg | ORAL_TABLET | Freq: Once | ORAL | Status: AC
  Administered 2019-08-04: 2.5 mg via ORAL
  Filled 2019-08-04: qty 1

## 2019-08-04 MED ORDER — SODIUM CHLORIDE 0.9 % IV SOLN
1.0000 g | INTRAVENOUS | Status: DC
  Administered 2019-08-04 – 2019-08-05 (×2): 1 g via INTRAVENOUS
  Filled 2019-08-04: qty 10

## 2019-08-04 MED ORDER — METOPROLOL SUCCINATE ER 50 MG PO TB24
75.0000 mg | ORAL_TABLET | Freq: Every day | ORAL | Status: DC
  Administered 2019-08-06: 75 mg via ORAL
  Filled 2019-08-04 (×2): qty 1

## 2019-08-04 NOTE — Progress Notes (Signed)
0816 patient decliend to eat breakfast, declined assistance with breakfast until daughter arrived. RN encouraged patient to eat since unsure of daughter [name]'s arrival time; patient declined  Patient stated will take medicine once he eats breakfast.  Observed posterior left mid site of thoracentesis at 1653, bandaid intact no drianage. Sacrum foam lifted, not blanchable.

## 2019-08-04 NOTE — Consult Note (Signed)
NAME:  Aaron Frye, MRN:  [id number], DOB:  [birth year], LOS: 2 ADMISSION DATE:  08/02/2019, CONSULTATION DATE:  2/2 REFERRING MD:  Pokhrel, CHIEF COMPLAINT:  Pleural effusion    Brief History   86 year old male patient with significant cardiac history including chronic diastolic dysfunction, atrial fibrillation, and severe valvular disease.  Admitted with acute decompensated heart failure with acute pulmonary edema and bilateral pleural effusions 1/31.  Treated with IV diuresis, in spite of this had significant oxygen requirements and rising serum creatinine with chest x-ray continuing to demonstrate bilateral pleural effusions because of this pulmonary asked to evaluate for pleural effusions. History of present illness   This is a 86 year old male patient who was admitted on 1/31 with chief complaint of approximately 24 hours of remarkably increased shortness of breath with exertion.  At baseline he can typically carry out ADLs at home with the assistance of nursing staff.  He is typically ambulatory with a walker, able to eat his own meals, get about his home independently.  On time of admission he was barely able to walk 8 to 10 feet without shortness of breath.  He was admitted to the internal medicine service, cardiology was consulted.  Treatments include supplemental oxygen IV diuresis, in spite of this he continues to have high FiO2 requirements, can only walk approximately 8 feet before having to stop to catch his breath, chest x-ray continued to demonstrate pleural effusions.  Because of this pulmonary asked to evaluate and consider thoracentesis both diagnostic and therapeutic.  Past Medical History  Atrial fib on coumadin, CVA, HTN, severe MS, MR and chronic diastolic dysfxn. PH, COPD, kyphosis and chronic oxygen dep resp failure due to this.    Significant Hospital Events   1/31 admitted 2/2 pccm consulted bilateral thoracentesis completed at bedside  Consults:  Cards consulted  2/1 Pulm consulted 2/2  Procedures:  Bilateral thoracentesis Right chest, 1450 mL concentrated rust colored pleural fluid Left chest, 1350 mL concentrated rust colored pleural fluid  Significant Diagnostic Tests:  Echo 1/31: 1. Left ventricular ejection fraction, by visual estimation, is 55 to  60%. The left ventricle has normal function. There is no left ventricular  hypertrophy. 2. The left ventricle has no regional wall motion abnormalities.  3. Global right ventricle was not well visualized.The right ventricular size is mildly enlarged. Right vetricular wall thickness was not assessed. 4. RV is difficult to see well Appears mildly dilated. Free wall is difficult to see to evaluate function adequately.5. Left atrial size was severely dilated.  6. Right atrial size was severely dilated. 7. Large pleural effusion.  Micro Data:  1/31 BCX2>>> 2/2: left thora>>> 2/2 right thora>>> Antimicrobials:    Interim history/subjective:   Feels better status post thoracentesis Objective   Blood pressure 108/63, pulse 94, temperature (Abnormal) 97.3 F (36.3 C), temperature source Oral, resp. rate 19, height 5\' 6"  (1.676 m), weight 56.7 kg, SpO2 92 %.        Intake/Output Summary (Last 24 hours) at 08/04/2019 1114 Last data filed at 08/04/2019 1005 Gross per 24 hour  Intake 660 ml  Output no documentation  Net 660 ml   Filed Weights   08/02/19 1852 08/03/19 0435 08/04/19 0004  Weight: 59.2 kg 59.2 kg 56.7 kg    Examination: General: Elderly 86 year old white male currently sitting in bed he is in no acute distress but is short of breath with really minimal exertion and demonstrates very poor activity tolerance HENT: Normocephalic atraumatic no jugular venous distention Lungs: Bibasilar  rales, no accessory use, currently 5 L nasal cannula Cardiovascular: Regular irregular, soft systolic murmur will rate 5 out of 7 Abdomen: Soft nontender Extremities: Chronic lower extremity venous  stasis changes, weak pulses, foot and pressure boot  Neuro: Awake oriented hard of hearing no focal deficits GU: Clear yellow urine  Resolved Hospital Problem list     Assessment & Plan:  Acute on chronic hypoxic respiratory failure in setting of acute diastolic heart failure w/ acute pulmonary edema and R>left pleural effusions superimposed on underlying COPD and also restrictive physiology 2/2 kyphosis. Chronic AF on Coumadin Acute on chronic diastolic heart failure Valvular heart disease HTN AKI  Leukocytosis Left foot ulcer  H/o CVA Fluid and electrolyte imbalance  Pulmonary problem list  Acute on chronic hypoxic respiratory failure in setting of acute diastolic heart failure w/ acute pulmonary edema and R>left pleural effusions superimposed on underlying COPD and also restrictive physiology 2/2 kyphosis.  -Oyxgen requirements climbing PCXR: Demonstrated pulmonary edema and right greater than left pleural effusion. Bilateral bedside ultrasound demonstrates large free-flowing bilateral pleural effusions He is now status post bilateral thoracentesis, in light of aggressive diuresis fluid evaluation was more rust colored than expected, suspect these are somewhat chronic and likely represent pseudoexudative process from diuretics Plan Fluid analysis sent Follow-up chest x-ray Continue diuresis as BUN blood pressure creatinine tolerate Given body habitus, advanced age, and procedural risk would try to manage this going forward diuretics if able   Labs   CBC: Recent Labs  Lab 08/02/19 0135 08/03/19 0212 08/04/19 0345  WBC 20.8* 18.3* 17.5*  NEUTROABS 18.0*  --   --   HGB 11.0* 8.8* 9.5*  HCT 36.9* 29.0* 31.1*  MCV 85.8 84.1 84.5  PLT 232 186 [id number]    Basic Metabolic Panel: Recent Labs  Lab 08/02/19 0135 08/02/19 0429 08/03/19 0212 08/04/19 0345  NA 139  --  141 142  K 3.3*  --  4.1 4.4  CL 93*  --  94* 94*  CO2 33*  --  33* 37*  GLUCOSE 145*  --  107* 117*  BUN  23  --  30* 37*  CREATININE 1.02  --  1.08 1.29*  CALCIUM 8.6*  --  8.6* 9.0  MG  --  2.0 2.2  --   PHOS  --   --  3.9  --    GFR: Estimated Creatinine Clearance: 29.3 mL/min (A) (by C-G formula based on SCr of 1.29 mg/dL (H)). Recent Labs  Lab 08/02/19 0135 08/02/19 0429 08/03/19 0212 08/04/19 0345  PROCALCITON  --  0.85  --   --   WBC 20.8*  --  18.3* 17.5*    Liver Function Tests: Recent Labs  Lab 08/04/19 0345  AST 18  ALT 16  ALKPHOS 81  BILITOT 1.4*  PROT 6.8  ALBUMIN 2.2*   No results for input(s): LIPASE, AMYLASE in the last 168 hours. No results for input(s): AMMONIA in the last 168 hours.  ABG No results found for: PHART, PCO2ART, PO2ART, HCO3, TCO2, ACIDBASEDEF, O2SAT   Coagulation Profile: Recent Labs  Lab 08/02/19 0354 08/03/19 0212 08/04/19 0345  INR 3.1* 4.0* 2.5*    Cardiac Enzymes: No results for input(s): CKTOTAL, CKMB, CKMBINDEX, TROPONINI in the last 168 hours.  HbA1C: Hgb A1c MFr Bld  Date/Time Value Ref Range Status  03/30/2019 05:41 AM 6.6 (H) 4.8 - 5.6 % Final    Comment:    (NOTE) Pre diabetes:          5.7%-6.4%  Diabetes:              >6.4% Glycemic control for   <7.0% adults with diabetes   10/19/2017 09:35 AM 6.2 (H) 4.8 - 5.6 % Final    Comment:    (NOTE) Pre diabetes:          5.7%-6.4% Diabetes:              >6.4% Glycemic control for   <7.0% adults with diabetes     CBG: No results for input(s): GLUCAP in the last 168 hours.  Review of Systems:   Review of Systems  Constitutional: Positive for chills, fever, malaise/fatigue and weight loss.  HENT: Positive for hearing loss.   Eyes: Negative.   Respiratory: Positive for cough and shortness of breath. Negative for sputum production.   Cardiovascular: Positive for chest pain, orthopnea, leg swelling and PND. Negative for palpitations.  Gastrointestinal: Negative.   Genitourinary: Negative.   Musculoskeletal: Negative.   Skin: Negative.   Neurological:  Negative.   Endo/Heme/Allergies: Negative.   Psychiatric/Behavioral: Negative.      Past Medical History  He,  has a past medical history of Anemia, Atrial fibrillation ([place]), Carotid artery disease ([place]), Chronic atrial fibrillation ([facility]), Chronic diastolic CHF (congestive heart failure) ([place]), Chronic respiratory failure ([place]), COPD (chronic obstructive pulmonary disease) ([place]), CVA (cerebral vascular accident) [facility]), Femur fracture, right [facility]) (March 2016), HLD (hyperlipidemia), HTN (hypertension), Mitral regurgitation, Mitral stenosis, Non-ischemic cardiomyopathy ([place]), Prostate cancer ([place]), Pulmonary hypertension ([place]), TIA (transient ischemic attack), Venous stasis, and Venous stasis ulcer ([place]).   Surgical History    Past Surgical History:  Procedure Laterality Date  . APPENDECTOMY    . FEMUR IM NAIL Left 04/01/2019   Procedure: INTRAMEDULLARY (IM) NAIL FEMORAL;  Surgeon: Rod Can, MD;  Location: [place];  Service: Orthopedics;  Laterality: Left;  . INTRAMEDULLARY (IM) NAIL INTERTROCHANTERIC Right 09/24/2014   Procedure: INTRAMEDULLARY (IM) NAIL INTERTROCHANTRIC;  Surgeon: Rod Can, MD;  Location: [place];  Service: Orthopedics;  Laterality: Right;  . TONSILLECTOMY       Social History   reports that he has never smoked. He has never used smokeless tobacco. He reports that he does not drink alcohol or use drugs.   Family History   His family history includes Cancer in his brother; Heart attack in his brother; Heart failure in his mother; Hypertension in his son; Lung disease in his father. There is no history of Stroke.   Allergies Allergies  Allergen Reactions  . Penicillins Rash and Other (See Comments)    Has patient had a PCN reaction causing immediate rash, facial/tongue/throat swelling, SOB or lightheadedness with hypotension: Yes Has patient had a PCN reaction causing severe rash involving mucus membranes or skin necrosis: Unk Has patient had a PCN reaction  that required hospitalization: No Has patient had a PCN reaction occurring within the last 10 years: No If all of the above answers are "NO", then may proceed with Cephalosporin use.      Home Medications  Prior to Admission medications   Medication Sig Start Date End Date Taking? Authorizing Provider  acetaminophen (TYLENOL) 325 MG tablet Take 325 mg by mouth 2 (two) times daily as needed (for headaches).   Yes [provider]  atorvastatin (LIPITOR) 40 MG tablet Take 1 tablet by mouth once daily Patient taking differently: Take 40 mg by mouth daily at 6 PM.  01/29/19  Yes Josue Hector, MD  furosemide (LASIX) 20 MG tablet TAKE 3 TABLETS BY MOUTH  TWICE A DAY Patient taking differently: Take 20 mg by mouth 2 (two) times daily.  06/17/19  Yes Josue Hector, MD  metoprolol succinate (TOPROL-XL) 25 MG 24 hr tablet Take 3 tablets by mouth once daily Patient taking differently: Take 75 mg by mouth daily.  02/05/19  Yes Josue Hector, MD  Multiple Vitamins-Minerals (ADULT ONE DAILY GUMMIES) CHEW Chew 1 tablet by mouth daily.   Yes [provider]  warfarin (COUMADIN) 5 MG tablet Take 1 tablet daily or as directed by Coumadin clinic Patient taking differently: Take 2.5 mg by mouth daily at 6 PM.  02/05/19  Yes Josue Hector, MD  OXYGEN Inhale 2 L into the lungs continuous.    [provider]     Erick Colace ACNP-BC [place] Pager # [phone] OR # [phone] if no answer

## 2019-08-04 NOTE — Progress Notes (Signed)
[place] for Warfarin  Indication: atrial fibrillation  Allergies  Allergen Reactions  . Penicillins Rash and Other (See Comments)    Has patient had a PCN reaction causing immediate rash, facial/tongue/throat swelling, SOB or lightheadedness with hypotension: Yes Has patient had a PCN reaction causing severe rash involving mucus membranes or skin necrosis: Unk Has patient had a PCN reaction that required hospitalization: No Has patient had a PCN reaction occurring within the last 10 years: No If all of the above answers are "NO", then may proceed with Cephalosporin use.     Vital Signs: Temp: 97.3 F (36.3 C) (02/02 0837) Temp Source: Oral (02/02 0837) BP: 103/69 (02/02 0837) Pulse Rate: 94 (02/02 0837)  Labs: Recent Labs    08/02/19 0135 08/02/19 0135 08/02/19 0354 08/03/19 0212 08/04/19 0345  HGB 11.0*   < >  --  8.8* 9.5*  HCT 36.9*  --   --  29.0* 31.1*  PLT 232  --   --  186 221  LABPROT  --   --  31.7* 38.9* 27.1*  INR  --   --  3.1* 4.0* 2.5*  CREATININE 1.02  --   --  1.08 1.29*  TROPONINIHS 13  --  18*  --   --    < > = values in this interval not displayed.    Estimated Creatinine Clearance: 29.3 mL/min (A) (by C-G formula based on SCr of 1.29 mg/dL (H)).   Assessment: 86 y/o M on warfarin PTA for afib admitted with CHF exacerbation. PTA warfarin dose - 2.5 mg daily except 5 mg on Mon, Fri, last dose taken 1/30 PTA.  INR 3.1 on admit, trended up to 4.0 yesterday, now back down to therapeutic at 2.5. CBC stable. No active bleed issues documented.  Goal of Therapy:  INR 2-3 Monitor platelets by anticoagulation protocol: Yes   Plan:  Resume warfarin 2.5mg  PO x 1 Monitor daily INR, CBC, s/sx bleeding   Elicia Lamp, PharmD, BCPS Please check AMION for all [place] contact numbers Clinical Pharmacist 08/04/2019 9:48 AM

## 2019-08-04 NOTE — Progress Notes (Signed)
Small right PTX s/p thoracentesis. The procedure went very smooth w/ no air aspirated. Given chronicity I wonder if element of trapped lung.   Plan Repeat CXR at 1600 Pulse ox  Supplemental oxygen  Erick Colace ACNP-BC [place] Pager # [phone] OR # [phone] if no answer

## 2019-08-04 NOTE — Progress Notes (Signed)
PT Cancellation Note  Patient Details Name: Aaron Frye MRN: [id number] DOB: 11/23/1924   Cancelled Treatment:    Reason Eval/Treat Not Completed: Fatigue/lethargy limiting ability to participate;Patient at procedure or test/unavailable - pt states "I just had fluid drawn off my lungs, I cannot do therapy today" s/p thoracentesis, even with max verbal encouragement. PT to check back as schedule allows.   [place] Pager [phone]  Office [phone]    Roxine Caddy D Elonda Husky 08/04/2019, 2:27 PM

## 2019-08-04 NOTE — Progress Notes (Addendum)
Progress Note  Patient Name: Aaron Frye Date of Encounter: 08/04/2019  Primary Cardiologist:  Jenkins Rouge, MD  Subjective   SOB worse after drinking water, very poor cough  Inpatient Medications    Scheduled Meds: . atorvastatin  40 mg Oral q1800  . collagenase   Topical Daily  . feeding supplement (ENSURE ENLIVE)  237 mL Oral BID BM  . furosemide  40 mg Intravenous BID  . metoprolol succinate  75 mg Oral Daily  . multivitamin with minerals  1 tablet Oral Daily  . sodium chloride flush  3 mL Intravenous Q12H  . warfarin  2.5 mg Oral ONCE-1800  . Warfarin - Pharmacist Dosing Inpatient   Does not apply q1800   Continuous Infusions: . sodium chloride     PRN Meds: sodium chloride, acetaminophen, ipratropium-albuterol, Resource ThickenUp Clear, sodium chloride flush   Vital Signs    Vitals:   08/04/19 0004 08/04/19 0547 08/04/19 0837 08/04/19 1005  BP:  123/70 103/69   Pulse:  93 94   Resp:  19    Temp:  97.8 F (36.6 C) (!) 97.3 F (36.3 C)   TempSrc:  Oral Oral   SpO2:  100% 97% 97%  Weight: 56.7 kg     Height:        Intake/Output Summary (Last 24 hours) at 08/04/2019 1024 Last data filed at 08/04/2019 1005 Gross per 24 hour  Intake 660 ml  Output 200 ml  Net 460 ml   Filed Weights   08/02/19 1852 08/03/19 0435 08/04/19 0004  Weight: 59.2 kg 59.2 kg 56.7 kg   Last Weight  Most recent update: 08/04/2019 12:04 AM   Weight  56.7 kg (125 lb)           Weight change: -2.5 kg   Telemetry    Atrial fib, rate ok - Personally Reviewed  ECG    None today - Personally Reviewed  Physical Exam   General: frail, elderly, male appears SOB. Head: Normocephalic, atraumatic.  Neck: Supple without bruits, JVD to jaw. Lungs:  Resp regular and unlabored, dense rales bilaterally w/ decreased BS bases. Heart: Irreg R&R, S1, S2, no S3, S4, or murmur; no rub. Abdomen: Soft, non-tender, non-distended with normoactive bowel sounds. No hepatomegaly. No  rebound/guarding. No obvious abdominal masses. Extremities: No clubbing, cyanosis, no edema. Distal pedal pulses are 2+ bilaterally. Neuro: Alert and oriented X 3. Moves all extremities spontaneously. Psych: Normal affect.  Labs    Hematology Recent Labs  Lab 08/02/19 0135 08/03/19 0212 08/04/19 0345  WBC 20.8* 18.3* 17.5*  RBC 4.30 3.45* 3.68*  HGB 11.0* 8.8* 9.5*  HCT 36.9* 29.0* 31.1*  MCV 85.8 84.1 84.5  MCH 25.6* 25.5* 25.8*  MCHC 29.8* 30.3 30.5  RDW 18.5* 18.3* 18.6*  PLT 232 186 221    Chemistry Recent Labs  Lab 08/02/19 0135 08/03/19 0212 08/04/19 0345  NA 139 141 142  K 3.3* 4.1 4.4  CL 93* 94* 94*  CO2 33* 33* 37*  GLUCOSE 145* 107* 117*  BUN 23 30* 37*  CREATININE 1.02 1.08 1.29*  CALCIUM 8.6* 8.6* 9.0  PROT  --   --  6.8  ALBUMIN  --   --  2.2*  AST  --   --  18  ALT  --   --  16  ALKPHOS  --   --  81  BILITOT  --   --  1.4*  GFRNONAA >60 59* 48*  GFRAA >60 >60 55*  ANIONGAP  13 14 11      High Sensitivity Troponin:   Recent Labs  Lab 08/02/19 0135 08/02/19 0354  TROPONINIHS 13 18*      BNP Recent Labs  Lab 08/02/19 0135 08/03/19 0212  BNP 357.0* 614.5*     Radiology    DG Chest Portable 1 View  Result Date: 08/02/2019 CLINICAL DATA:  Shortness of breath. Hypoxia. EXAM: PORTABLE CHEST 1 VIEW COMPARISON:  Radiograph 04/13/2019, 10/20/2017. FINDINGS: Chronic cardiomegaly, possibly increased from prior. Aortic atherosclerosis. Moderate right and small left pleural effusion, slightly increased from prior exam. Adjacent bibasilar airspace disease favors atelectasis. Reticular opacities with increased septal thickening at the bases suspicious for pulmonary edema. No pneumothorax. Bones are diffusely under mineralized. IMPRESSION: 1. CHF pattern. Cardiomegaly with pleural effusions and pulmonary edema. Findings have progressed from 04/13/2019 radiograph. 2.  Aortic Atherosclerosis (ICD10-I70.0). Electronically Signed   By: Keith Rake M.D.    On: 08/02/2019 02:10   DG Foot 2 Views Left  Result Date: 08/02/2019 CLINICAL DATA:  Lateral foot ulcer at the metatarsal area. EXAM: LEFT FOOT - 2 VIEW COMPARISON:  None. FINDINGS: Regional arterial calcification. No plain radiographic evidence of osteomyelitis or pathologic fracture. Mild regional osteopenia. IMPRESSION: No plain radiographic sign of osteomyelitis.  No fracture. Electronically Signed   By: Nelson Chimes M.D.   On: 08/02/2019 05:57   ECHOCARDIOGRAM COMPLETE  Result Date: 08/02/2019   ECHOCARDIOGRAM REPORT   Patient Name:   Aaron Frye Date of Exam: 08/02/2019 Medical Rec #:  [id number]        Height:       67.0 in Accession #:    [id number]       Weight:       141.5 lb Date of Birth:  Oct 06, 1924        BSA:          1.75 m Patient Age:    86 years         BP:           109/64 mmHg Patient Gender: M                HR:           86 bpm. Exam Location:  Inpatient Procedure: 2D Echo, 3D Echo, Color Doppler and Cardiac Doppler Indications:    [id number] Acute diastolic (congestive) heart failure, Mitral Valve                 Stenosis 105.0  History:        Patient has prior history of Echocardiogram examinations, most                 recent 07/18/2017. CHF, COPD, Arrythmias:Atrial Fibrillation;                 Risk Factors:Hypertension and Dyslipidemia.  Sonographer:    Raquel Sarna Senior RDCS Referring Phys: [id number] [place]  1. Left ventricular ejection fraction, by visual estimation, is 55 to 60%. The left ventricle has normal function. There is no left ventricular hypertrophy.  2. The left ventricle has no regional wall motion abnormalities.  3. Global right ventricle was not well visualized.The right ventricular size is mildly enlarged. Right vetricular wall thickness was not assessed.  4. RV is difficult to see well Appears mildly dilated. Free wall is difficult to see to evaluate function adequately.  5. Left atrial size was severely dilated.  6. Right atrial size was  severely dilated.  7. Large pleural effusion.  8.  Moderate thickening of the mitral valve leaflet(s).  9. The mitral valve is abnormal. Mild to moderate mitral valve regurgitation. Mild mitral stenosis. 10. Mitral valve is thickened with restricted motion. Peak and mean gradients through the valve are 29 and 10 mm Hg respectively MVA by P T1/2 is approximately 1.5 cm 2 consistent with moderate MS. COmpared to report from January 2019 no significant change in mean gradient. 11. The tricuspid valve is normal in structure. 12. The tricuspid valve is normal in structure. Tricuspid valve regurgitation is moderate. 13. The aortic valve is abnormal. Aortic valve regurgitation is not visualized. 14. AV is thickened with restricted motion Peak and mean gradients are 15 and 7 mm Hg respectively LVOT/AV VTI ratio is 0.41 consistent with mild to moderate AS. 15. The pulmonic valve was grossly normal. Pulmonic valve regurgitation is not visualized. 16. Moderately elevated pulmonary artery systolic pressure. 17. The inferior vena cava is normal in size with greater than 50% respiratory variability, suggesting right atrial pressure of 3 mmHg. 18. The interatrial septum was not assessed. FINDINGS  Left Ventricle: Left ventricular ejection fraction, by visual estimation, is 55 to 60%. The left ventricle has normal function. The left ventricle has no regional wall motion abnormalities. The left ventricular internal cavity size was the left ventricle is normal in size. There is no left ventricular hypertrophy. Right Ventricle: The right ventricular size is mildly enlarged. Right vetricular wall thickness was not assessed. Global RV systolic function is was not well visualized. The tricuspid regurgitant velocity is 3.85 m/s, and with an assumed right atrial pressure of 8 mmHg, the estimated right ventricular systolic pressure is moderately elevated at 67.3 mmHg. RV is difficult to see well Appears mildly dilated. Free wall is difficult  to see to evaluate function adequately. Left Atrium: Left atrial size was severely dilated. Right Atrium: Right atrial size was severely dilated Pericardium: There is no evidence of pericardial effusion. There is a large pleural effusion. Mitral Valve: The mitral valve is abnormal. There is moderate thickening of the mitral valve leaflet(s). Mildly decreased mobility of the mitral valve leaflets. Mild to moderate mitral valve regurgitation. Mild mitral valve stenosis by observation. MV peak gradient, 28.0 mmHg. Mitral valve is thickened with restricted motion. Peak and mean gradients through the valve are 29 and 10 mm Hg respectively MVA by P T1/2 is approximately 1.5 cm 2 consistent with moderate MS. COmpared to report from January 2019 no significant change in mean gradient. Tricuspid Valve: The tricuspid valve is normal in structure. Tricuspid valve regurgitation is moderate. Aortic Valve: The aortic valve is abnormal. Aortic valve regurgitation is not visualized. Aortic valve mean gradient measures 7.0 mmHg. AV is thickened with restricted motion Peak and mean gradients are 15 and 7 mm Hg respectively LVOT/AV VTI ratio is 0.41 consistent with mild to moderate AS. Pulmonic Valve: The pulmonic valve was grossly normal. Pulmonic valve regurgitation is not visualized. Pulmonic regurgitation is not visualized. Aorta: The aortic root is normal in size and structure. Venous: The inferior vena cava is normal in size with greater than 50% respiratory variability, suggesting right atrial pressure of 3 mmHg. IAS/Shunts: The interatrial septum was not assessed.  LEFT VENTRICLE PLAX 2D LVIDd:         4.60 cm LVIDs:         3.20 cm LV PW:         0.70 cm LV IVS:        0.80 cm LVOT diam:     1.70 cm  LV SV:         56 ml LV SV Index:   32.33 LVOT Area:     2.27 cm  RIGHT VENTRICLE RV S prime:     7.21 cm/s TAPSE (M-mode): 1.4 cm LEFT ATRIUM            Index       RIGHT ATRIUM           Index LA diam:      5.10 cm  2.92 cm/m   RA Area:     23.00 cm LA Vol (A2C): 81.0 ml  46.39 ml/m RA Volume:   70.40 ml  40.32 ml/m LA Vol (A4C): 108.0 ml 61.86 ml/m  AORTIC VALVE AV Mean Grad: 7.0 mmHg LVOT Vmax:    78.30 cm/s LVOT Vmean:   50.500 cm/s LVOT VTI:     0.159 m  AORTA Ao Root diam: 3.50 cm Ao Asc diam:  3.60 cm MITRAL VALVE               TRICUSPID VALVE MV Area (PHT): 1.69 cm    TR Peak grad:   59.3 mmHg MV Peak grad:  28.0 mmHg   TR Vmax:        385.00 cm/s MV Mean grad:  10.0 mmHg MV Vmax:       2.64 m/s    SHUNTS MV Vmean:      142.0 cm/s  Systemic VTI:  0.16 m MV VTI:        0.77 m      Systemic Diam: 1.70 cm MV PHT:        130.00 msec  Dorris Carnes MD Electronically signed by Dorris Carnes MD Signature Date/Time: 08/02/2019/5:09:34 PM    Final      Cardiac Studies   ECHO:  08/03/2019 1. Left ventricular ejection fraction, by visual estimation, is 55 to  60%. The left ventricle has normal function. There is no left ventricular  hypertrophy.  2. The left ventricle has no regional wall motion abnormalities.  3. Global right ventricle was not well visualized.The right ventricular  size is mildly enlarged. Right vetricular wall thickness was not assessed.  4. RV is difficult to see well Appears mildly dilated. Free wall is  difficult to see to evaluate function adequately.  5. Left atrial size was severely dilated.  6. Right atrial size was severely dilated.  7. Large pleural effusion.  8. Moderate thickening of the mitral valve leaflet(s).  9. The mitral valve is abnormal. Mild to moderate mitral valve  regurgitation. Mild mitral stenosis.  10. Mitral valve is thickened with restricted motion. Peak and mean  gradients through the valve are 29 and 10 mm Hg respectively MVA by P T1/2  is approximately 1.5 cm 2 consistent with moderate MS. COmpared to report  from January 2019 no significant  change in mean gradient.  11. The tricuspid valve is normal in structure.  12. The tricuspid valve is normal in  structure. Tricuspid valve  regurgitation is moderate.  13. The aortic valve is abnormal. Aortic valve regurgitation is not  visualized.  14. AV is thickened with restricted motion Peak and mean gradients are 15  and 7 mm Hg respectively LVOT/AV VTI ratio is 0.41 consistent with mild to  moderate AS.  15. The pulmonic valve was grossly normal. Pulmonic valve regurgitation is  not visualized.  16. Moderately elevated pulmonary artery systolic pressure.  17. The inferior vena cava is normal in size with greater than 50%  respiratory variability, suggesting right atrial pressure of 3 mmHg.  18. The interatrial septum was not assessed.   Patient Profile     86 y.o. male w/ hx chronic Afib, D-CHF, HTN, HLD, CVA, COPD w/ restrictive lung dz, pulm HTN, chronic resp failure on home O2, mitral and tricuspid regurg, mitral stenosis, carotid dz, anemia, venous stasis w/ ulcers, was admitted 01/31 w/ CHF  Assessment & Plan    1. Acute on chronic diastolic CHF - continue IV Lasix for now, but not sure it will help much - BUN/Cr are higher than yesterday, but resp no better - R>L pleural effusions larger than 04/2019 - will leave to IM to decide if needs decubitus films and possible thoracentesis - *Daughter spoke w/ her father about procedure discussed w/ her on the phone yesterday, both of them are in favor of it.   2. Elevated trop - flat trend, more c/w CHF than ACS - no ischemic eval planned since EF nl on echo and no WMA  3. Difficulty swallowing - Speech saw this am, put him on Dysph 2 w/ honey thick liquids - pt had worsened SOB after taking pills, not able to cough effectively, very concerning for aspiration - per IM  Otherwise, per IM Principal Problem:   Acute exacerbation of CHF (congestive heart failure) ([place]) Active Problems:   Atrial fibrillation, chronic (HCC)   Mitral stenosis   Acute on chronic respiratory failure (HCC)   COPD (chronic obstructive pulmonary disease)  ([place])   Pressure injury of skin    Signed, Rosaria Ferries , PA-C 10:24 AM 08/04/2019 Pager: [phone]   Attending Note:   The patient was seen and examined.  Agree with assessment and plan as noted above.  Changes made to the above note as needed.  Patient seen and independently examined with Rosaria Ferries, PA .   We discussed all aspects of the encounter. I agree with the assessment and plan as stated above.  1.   Acute respiratory failure: Was thought to have acute on chronic diastolic congestive heart failure.  He may have some aspect of that but suspect a lot of his shortness of breath and respiratory failure is due to chronic and acute aspiration. He was seen by speech pathology and was documented to aspirate.  Continue current medications.  2.  Atrial fibrillation-stable.  Heart rate is well controlled.  3.  Large left pleural effusion.  He is status post thoracentesis.  He has a small pneumothorax which appears to be stable.    I have spent a total of 40 minutes with patient reviewing hospital  notes , telemetry, EKGs, labs and examining patient as well as establishing an assessment and plan that was discussed with the patient. > 50% of time was spent in direct patient care.    Thayer Headings, Brooke Bonito., MD, [facility] 08/04/2019, 6:14 PM [address],  [place] Pager [phone]

## 2019-08-04 NOTE — Progress Notes (Signed)
1038 PA Barrett at bedside, informed to hold Lasix until further direction. Informed Metoprolol given  Observed patient increased WOB with thickened liquids during med administration. Patient repositioned so more upright. Increased O2 to 5L from 4L. PA Barrett informed (at bedside).   Spoke with daughter [name] at bedside at 1047. Daughter stated MD informed her fluid in and outside lung and patient stated ok with removing fluid using needle. Spoke with PA Barrett at 1053 about daughter ok with removing fluid. Per Barrett; ok to give Lasix  Approx 1115 lost IV access, Lasix held to start new IV  Paged [facility].Daughter [name] @ bedside stated pt.(father) ok with doing thoracentesis.   CC MD Kary Kos at bedside 1146 to assess patient for bedside thoracentesis.    At 1200 hospital [facility], CC MD Kary Kos and staff at bedside for thoracentesis. Per [facility] patient RN not needed at bedside to complete procedure.  Paged PA Barrett at 1249.Pt. currently receiving bedside thoracentesis.Lasix not yet given(lost IV access & now thorac).still give Lasix? Upon callback informed BP soft; informed to hold Lasix due to hypotension and will re-eval tomorrow.   RN to bedside at 1305 post procedure to assess patient. No distress, patient A&Ox4, VS stable.   1311 notified by radiology assistant small right side pneumothorax < 10%, patchy opacities in left mid to lower lung might and righ lung base maybe atelectasis or pneumonia.   Patient placed on PC monitor M07. VS assessed.   Paged [place] at 1321.Per Radiology @ 1311.Sm right side pneumothorax <10%,pathcy opacity L mid& R lung base.please call back. Upon callback at 1325 informed patient not in resp distress, on 5L. Pokhrel stated will inform Kary Kos about radiology info from CT chest  [place] to bedside; continue to monitor, keep at curernt O2 of 5L, paged critical care with concerns at 667  Per Barrett at 1906 face to face; informed systolic just above [id number],  however, still soft. Hold Lasix IV for systolic [id number] and will re-eval tomorrow.

## 2019-08-04 NOTE — Evaluation (Signed)
Clinical/Bedside Swallow Evaluation Patient Details  Name: Aaron Frye MRN: [id number] Date of Birth: 07-16-1924  Today's Date: 08/04/2019 Time: SLP Start Time (ACUTE ONLY): [id number] SLP Stop Time (ACUTE ONLY): 0926 SLP Time Calculation (min) (ACUTE ONLY): 33 min  Past Medical History:  Past Medical History:  Diagnosis Date  . Anemia   . Atrial fibrillation ([place])    Persisent   . Carotid artery disease ([place])    a. Carotid doppler 2009 - bilateral plaque in the bifurcation on the right, 40% to 60% ICA stenosis; left, no ICA stenosis. The right vertebral artery was not visualized and left vertebral artery flow was antegrade  . Chronic atrial fibrillation ([place])   . Chronic diastolic CHF (congestive heart failure) ([place])   . Chronic respiratory failure ([place])   . COPD (chronic obstructive pulmonary disease) ([place])   . CVA (cerebral vascular accident) ([place])   . Femur fracture, right [facility]) March 2016  . HLD (hyperlipidemia)   . HTN (hypertension)   . Mitral regurgitation   . Mitral stenosis   . Non-ischemic cardiomyopathy ([place])    LVEF=35-40% by echo 2009  . Prostate cancer ([place])   . Pulmonary hypertension ([place])   . TIA (transient ischemic attack)   . Venous stasis   . Venous stasis ulcer ([place])    Past Surgical History:  Past Surgical History:  Procedure Laterality Date  . APPENDECTOMY    . FEMUR IM NAIL Left 04/01/2019   Procedure: INTRAMEDULLARY (IM) NAIL FEMORAL;  Surgeon: Rod Can, MD;  Location: [place];  Service: Orthopedics;  Laterality: Left;  . INTRAMEDULLARY (IM) NAIL INTERTROCHANTERIC Right 09/24/2014   Procedure: INTRAMEDULLARY (IM) NAIL INTERTROCHANTRIC;  Surgeon: Rod Can, MD;  Location: [place];  Service: Orthopedics;  Laterality: Right;  . TONSILLECTOMY     HPI:  Pt is a 86 y.o. male who presented via EMS for evaluation of shortness of breath. Medical history is significant of persistent atrial fibrillation, CVA, hypertension, hyperlipidemia, severe mitral  stenosis with moderate mitral regurgitation, chronic diastolic congestive heart failure, pulmonary hypertension, COPD and restrictive lung disease due to kyphosis with chronic hypoxic respiratory failure .  Oxygen saturation 80% on room air per EMS and improved to 96% on a nonrebreather. Chest x-ray revealed CHF pattern; cardiomegaly with pleural effusions and pulmonary edema. Modified barium swallow study of 04/02/19 revealed moderately severe oropharyngeal dysphagia secondary to generalized oropharyngeal weakness and reduced laryngeal closure with resultant silent aspiration of thin liquid and nectar-thick liquids. A dysphagia 2 diet with honey thick liquids via cup was recommended at that time with a "guarded" prognosis for advancement.    Assessment / Plan / Recommendation Clinical Impression   Pt was seen for bedside swallow evaluation. He has a documented history of dysphagia and reported that he inconsistently coughs at home during meals. With he pt's permission his daughter, [name], was contacted via phone. She indicated that the pt has been consuming softer foods and thickened liquids at home, has not received dysphagia treatment since the MBS on 04/02/19 and "hates that thickened stuff". Oral mechanism exam was within functional limits. Mastication time was prolonged with dysphagia 3 solids and pt reported increased effort as well as fatigue. Coughing was noted with thin liquids via straw suggesting aspiration. He tolerated dysphagia 3 solids, nectar thick liquids, and honey thick liquids without overt s/sx of aspiration; however, he does have a history of silent aspiration and that cannot be ruled out at bedside. The possibility of completing a repeat MBS was discussed but it  was ultimately decided that his diet be modified to what was recommended in October and that the study be deferred pending his tolerance. SLP will continue to follow pt to assess diet tolerance and determine the need for further  intervention.  SLP Visit Diagnosis: Dysphagia, oropharyngeal phase (R13.12)    Aspiration Risk  Moderate aspiration risk    Diet Recommendation Dysphagia 2 (Fine chop);Honey-thick liquid   Liquid Administration via: Cup Medication Administration: Crushed with puree Supervision: Staff to assist with self feeding Compensations: Slow rate;Small sips/bites Postural Changes: Seated upright at 90 degrees;Remain upright for at least 30 minutes after po intake    Other  Recommendations Oral Care Recommendations: Oral care BID;Staff/trained caregiver to provide oral care Other Recommendations: Order thickener from pharmacy   Follow up Recommendations Home health SLP      Frequency and Duration min 2x/week  2 weeks       Prognosis Prognosis for Safe Diet Advancement: Guarded Barriers to Reach Goals: Severity of deficits      Swallow Study   General Date of Onset: 04/02/19 HPI: Pt is a 86 y.o. male who presented via EMS for evaluation of shortness of breath. Medical history is significant of persistent atrial fibrillation, CVA, hypertension, hyperlipidemia, severe mitral stenosis with moderate mitral regurgitation, chronic diastolic congestive heart failure, pulmonary hypertension, COPD and restrictive lung disease due to kyphosis with chronic hypoxic respiratory failure .  Oxygen saturation 80% on room air per EMS and improved to 96% on a nonrebreather. Chest x-ray revealed CHF pattern; cardiomegaly with pleural effusions and pulmonary edema. Modified barium swallow study of 04/02/19 revealed moderately severe oropharyngeal dysphagia secondary to generalized oropharyngeal weakness and reduced laryngeal closure with resultant silent aspiration of thin liquid and nectar-thick liquids. A dysphagia 2 diet with honey thick liquids via cup was recommended at that time with a "guarded" prognosis for advancement.  Type of Study: Bedside Swallow Evaluation Previous Swallow Assessment: MBS on 04/02/19.  See HPI Diet Prior to this Study: Dysphagia 2 (chopped);Nectar-thick liquids Temperature Spikes Noted: No Respiratory Status: Nasal cannula History of Recent Intubation: No Behavior/Cognition: Alert;Cooperative;Pleasant mood Oral Cavity Assessment: Within Functional Limits Oral Care Completed by SLP: No Oral Cavity - Dentition: Adequate natural dentition Vision: Functional for self-feeding Self-Feeding Abilities: Needs assist Patient Positioning: Upright in bed;Postural control adequate for testing Baseline Vocal Quality: Breathy;Low vocal intensity Volitional Cough: Weak Volitional Swallow: Able to elicit    Oral/Motor/Sensory Function Overall Oral Motor/Sensory Function: Within functional limits   Ice Chips Ice chips: Not tested   Thin Liquid Thin Liquid: Impaired Presentation: Straw Pharyngeal  Phase Impairments: Cough - Immediate;Suspected delayed Swallow    Nectar Thick Nectar Thick Liquid: Impaired Presentation: Cup Pharyngeal Phase Impairments: Multiple swallows   Honey Thick Honey Thick Liquid: Within functional limits Presentation: Straw   Puree Puree: Impaired Presentation: Spoon Pharyngeal Phase Impairments: Multiple swallows   Solid     Solid: Impaired Presentation: Self Fed Oral Phase Functional Implications: Impaired mastication     Aurelio Mccamy I. Hardin Negus, [place], [place] Office number [phone] Pager [phone]  Horton Marshall 08/04/2019,9:37 AM

## 2019-08-04 NOTE — Procedures (Signed)
Thoracentesis Procedure Note  Pre-operative Diagnosis: left pleural effusion   Post-operative Diagnosis: same  Indications: treatment of dyspnea and evaluation of pleural fluid   Procedure Details  Consent: Informed consent was obtained. Risks of the procedure were discussed including: infection, bleeding, pain, pneumothorax.  Under sterile conditions the patient was positioned. Betadine solution and sterile drapes were utilized.  1% buffered lidocaine was used to anesthetize which was identified via real time [place]. Fluid was obtained without any difficulties and minimal blood loss.  A dressing was applied to the wound and wound care instructions were provided.   Findings 1350 ml of rusty  pleural fluid was obtained. A sample was sent to Pathology for, and cell counts, as well as for infection analysis.  Complications:  None; patient tolerated the procedure well.          Condition: stable  Plan A follow up chest x-ray was ordered. Bed Rest for 0 hours. Tylenol 650 mg. for pain.  Erick Colace ACNP-BC [place] Pager # [phone] OR # [phone] if no answer

## 2019-08-04 NOTE — Procedures (Signed)
Thoracentesis Procedure Note  Pre-operative Diagnosis: Large Right pleural effusion  Post-operative Diagnosis: same  Indications: SOB, large pleural effusion  Procedure Details  Consent: Informed consent was obtained. Risks of the procedure were discussed including: infection, bleeding, pain, pneumothorax.  Rapid response RN at bedside for continuous patient monitoring.   Under sterile conditions the patient was positioned. Chlorhexidine prep and sterile drapes were utilized.  1% plain lidocaine was used to anesthetize the 7/8 rib space. Fluid was obtained without any difficulties and minimal blood loss.  A dressing was applied to the wound and wound care instructions were provided.   Findings 1250 ml of rust colored pleural fluid was obtained. A sample was sent to Pathology for cytogenetics, flow, and cell counts, as well as for infection analysis.  Complications:  None; patient tolerated the procedure well.          Condition: stable  Plan A follow up chest x-ray was ordered. Lung sliding visualized post procedure on ultrasound  Kennieth Rad, MSN, AGACNP-BC [facility] 08/04/2019, 12:54 PM

## 2019-08-04 NOTE — Progress Notes (Signed)
PROGRESS NOTE  Aaron Frye [id number] DOB: 15-Aug-1924 DOA: 08/02/2019 PCP: Nickola Major, MD   LOS: 2 days   Brief narrative:  As per HPI,  Aaron Frye is a 86 y.o. male with medical history significant of persistent atrial fibrillation on Coumadin, CVA, hypertension, hyperlipidemia, severe mitral stenosis with moderate mitral regurgitation, chronic diastolic congestive heart failure, pulmonary hypertension, COPD and restrictive lung disease due to kyphosis with chronic hypoxic respiratory failure on 2 L home oxygen presented to the ED via EMS for evaluation of shortness of breath.  Oxygen saturation 80% on room air, per EMS and improved to 96% on a nonrebreather. ED Course: Afebrile.  Tachypneic.  WBC count 20.8, chronically elevated.  Potassium 3.3.  Initial high-sensitivity troponin negative, repeat pending.  BNP 357.  Blood culture x2 pending.  SARS-CoV-2 PCR test negative.  Influenza panel negative.  Chest x-ray showed cardiomegaly with pleural effusions and pulmonary edema. Patient received aspirin 324 mg, IV Lasix 40 mg, and potassium supplementation.  Assessment/Plan:  Principal Problem:   Acute exacerbation of CHF (congestive heart failure) (HCC) Active Problems:   Atrial fibrillation, chronic (HCC)   Mitral stenosis   Acute on chronic respiratory failure (HCC)   COPD (chronic obstructive pulmonary disease) (HCC)   Pressure injury of skin   Pleural effusion, left   Pleural effusion, right  Acute on chronic diastolic congestive heart failure. History of severe mitral stenosis with moderate mitral regurgitation, pulmonary hypertension.  Chest x-ray with pleural effusion and pulmonary edema.  Received IV Lasix.  2D echocardiogram done on 08/02/2019 showed LV strain fraction of 55 to 60%.  Will request cardiology consultation since patient complains of persistent dyspnea.  continue statin.  Bilateral pleural effusion with hypoxia.[name]  Spoke with the patient's daughter  Aaron Frye on the phone and offered the options of thoracocentesis.  Patient underwent thoracocentesis by pulmonary today with removal of 1250 mL rusty fluid from the right lung and 1350 mL of rusty fluid from the left lung.  Pleural fluid has been sent to the lab for analysis and culture.  Pulmonary on board.  Will follow culture and fluid analysis.  Post with progressive disease there is less than 10% pneumothorax in the right lung.  Patient not in respiratory distress.  Informed pulmonary team.  COPD / restrictive lung disease on 2 L of oxygen at baseline.  Currently on 5 L of oxygen by nasal cannula.  Initially required nonrebreather in the ED.  We will continue to monitor.  Patient is on a dysphagia 2 diet.  Left lateral foot ulceration.  Neuropathic ulcer.  Present on admission.  Seen by wound care.  Continue wound care.  Continue boot at this time.  X-ray of the foot did not show any evidence of osteomyelitis.  Persistent atrial fibrillation on Coumadin.  Pharmacy to dose Coumadin.  Rate is mostly controlled.  On metoprolol succinate at home.  Rate controlled at this time.  He is on Coumadin and INR is 2.5.  History of CVA .  Continue Coumadin.  No focal deficits.  Essential Hypertension.    Was hypotensive after IV Lasix initially..   Continue Lasix with parameters.  Patient is on metoprolol at home.  Metoprolol has been started.  Will closely monitor vitals.  Leukocytosis on presentation.  WBC of 17.5 from 18.3 today.[name] Appears to be chronically elevated.  Possibility of aspiration as well.   Seen by speech therapy and is considered to be moderate aspiration risk.  Dysphagia 2 diet has been  advised.  Abnormal urine analysis with WBC more than 50.  Urine culture shows more than 100,000 colonies of gram-negative rods.  proCalcitonin at 0.8.  Influenza was negative.  Covid test was negative.  Start the patient on Rocephin IV today.  Add Zithromax as well  Gram-negative UTI.  We will put the patient  on Rocephin for now.  Hypokalemia.  Mild.  Improved after replacement.    Ambulatory dysfunction. Walks with walker at baseline. Lives with his wife at home.  Physical therapy saw the patient and recommend home health on discharge.  Stage II sacral pressure ulceration   Present on admission.  Continue wound care. Pressure Injury 08/02/19 Buttocks Right;Left Stage 2 -  Partial thickness loss of dermis presenting as a shallow open injury with a red, pink wound bed without slough. (Active)  08/02/19 1500  Location: Buttocks  Location Orientation: Right;Left  Staging: Stage 2 -  Partial thickness loss of dermis presenting as a shallow open injury with a red, pink wound bed without slough.  Wound Description (Comments):   Present on Admission: Yes    VTE Prophylaxis: Coumadin  Code Status: Full code.    Family Communication: I spoke with the patient's daughter Ms. Aaron Frye on the phone x2 today and updated her about the clinical condition of the patient and also discussed about thoracocentesis.  Disposition Plan: likely Home with home health likely in 2-3 days.   Will obtain chest x-ray in a.m.  Wean oxygen as tolerated.  Continue PT as tolerated.  Overall prognosis of the patient is poor but the patient's family wish to proceed with full code and current level of care.  Consultants:  Cardiology  Pulmonary  Procedures:  Bilateral thoracocentesis  Antibiotics: . None  Anti-infectives (From admission, onward)   None     Subjective: Today, patient complains of dyspnea on minimal exertion.  Still requiring high flow oxygen.  has intermittent vague chest discomfort.  No fevers, chills or rigor.  No nausea vomiting or diarrhea.   Objective: Vitals:   08/04/19 1246 08/04/19 1322  BP: (!) 89/55 100/66  Pulse: 86 82  Resp:  (!) 23  Temp:    SpO2: 94% 99%    Intake/Output Summary (Last 24 hours) at 08/04/2019 1324 Last data filed at 08/04/2019 1251 Gross per 24 hour  Intake 683  ml  Output --  Net 683 ml   Filed Weights   08/02/19 1852 08/03/19 0435 08/04/19 0004  Weight: 59.2 kg 59.2 kg 56.7 kg   Body mass index is 20.18 kg/m.   Physical Exam: GENERAL: Patient is alert awake and communicative.. Not in obvious distress.  Thinly built.  On Nasal cannula oxygen at 5 L/min.  Hard of hearing HENT: No scleral pallor or icterus. Pupils equally reactive to light. Oral mucosa is moist NECK: is supple, no obvious thyroid palpable. CHEST:   Diminished breath sounds bilaterally.  CVS: S1 and S2 heard, irregular rhythm.  ABDOMEN: Soft, non-tender, bowel sounds are present.  External urinary catheter in place.  EXTREMITIES: Left leg with boot.  Edema+ CNS: Cranial nerves are intact.  Moves all extremities. SKIN: warm and dry, left lower extremity ulceration.  Sacral stage II ulceration.  Data Review: I have personally reviewed the following laboratory data and studies,  CBC: Recent Labs  Lab 08/02/19 0135 08/03/19 0212 08/04/19 0345  WBC 20.8* 18.3* 17.5*  NEUTROABS 18.0*  --   --   HGB 11.0* 8.8* 9.5*  HCT 36.9* 29.0* 31.1*  MCV 85.8 84.1  84.5  PLT 232 186 [id number]   Basic Metabolic Panel: Recent Labs  Lab 08/02/19 0135 08/02/19 0429 08/03/19 0212 08/04/19 0345  NA 139  --  141 142  K 3.3*  --  4.1 4.4  CL 93*  --  94* 94*  CO2 33*  --  33* 37*  GLUCOSE 145*  --  107* 117*  BUN 23  --  30* 37*  CREATININE 1.02  --  1.08 1.29*  CALCIUM 8.6*  --  8.6* 9.0  MG  --  2.0 2.2  --   PHOS  --   --  3.9  --    Liver Function Tests: Recent Labs  Lab 08/04/19 0345  AST 18  ALT 16  ALKPHOS 81  BILITOT 1.4*  PROT 6.8  ALBUMIN 2.2*   No results for input(s): LIPASE, AMYLASE in the last 168 hours. No results for input(s): AMMONIA in the last 168 hours. Cardiac Enzymes: No results for input(s): CKTOTAL, CKMB, CKMBINDEX, TROPONINI in the last 168 hours. BNP (last 3 results) Recent Labs    08/02/19 0135 08/03/19 0212  BNP 357.0* 614.5*    ProBNP  (last 3 results) No results for input(s): PROBNP in the last 8760 hours.  CBG: No results for input(s): GLUCAP in the last 168 hours. Recent Results (from the past 240 hour(s))  Blood culture (routine x 2)     Status: None (Preliminary result)   Collection Time: 08/02/19  1:35 AM   Specimen: BLOOD  Result Value Ref Range Status   Specimen Description BLOOD RIGHT ARM  Final   Special Requests   Final    BOTTLES DRAWN AEROBIC AND ANAEROBIC Blood Culture results may not be optimal due to an inadequate volume of blood received in culture bottles Performed at [facility] [address] [place], [place] [address]    Culture NO GROWTH 2 DAYS  Final   Report Status PENDING  Incomplete  Respiratory Panel by RT PCR (Flu A&B, Covid) - Nasopharyngeal Swab     Status: None   Collection Time: 08/02/19  1:43 AM   Specimen: Nasopharyngeal Swab  Result Value Ref Range Status   SARS Coronavirus 2 by RT PCR NEGATIVE NEGATIVE Final    Comment: (NOTE) SARS-CoV-2 target nucleic acids are NOT DETECTED. The SARS-CoV-2 RNA is generally detectable in upper respiratoy specimens during the acute phase of infection. The lowest concentration of SARS-CoV-2 viral copies this assay can detect is 131 copies/mL. A negative result does not preclude SARS-Cov-2 infection and should not be used as the sole basis for treatment or other patient management decisions. A negative result may occur with  improper specimen collection/handling, submission of specimen other than nasopharyngeal swab, presence of viral mutation(s) within the areas targeted by this assay, and inadequate number of viral copies (<131 copies/mL). A negative result must be combined with clinical observations, patient history, and epidemiological information. The expected result is Negative. Fact Sheet for Patients:  PinkCheek.be Fact Sheet for Healthcare Providers:   GravelBags.it This test is not yet ap proved or cleared by the [place] FDA and  has been authorized for detection and/or diagnosis of SARS-CoV-2 by FDA under an Emergency Use Authorization (EUA). This EUA will remain  in effect (meaning this test can be used) for the duration of the COVID-19 declaration under Section 564(b)(1) of the Act, 21 U.S.C. section 360bbb-3(b)(1), unless the authorization is terminated or revoked sooner.    Influenza A by PCR NEGATIVE NEGATIVE Final  Influenza B by PCR NEGATIVE NEGATIVE Final    Comment: (NOTE) The Xpert Xpress SARS-CoV-2/FLU/RSV assay is intended as an aid in  the diagnosis of influenza from Nasopharyngeal swab specimens and  should not be used as a sole basis for treatment. Nasal washings and  aspirates are unacceptable for Xpert Xpress SARS-CoV-2/FLU/RSV  testing. Fact Sheet for Patients: PinkCheek.be Fact Sheet for Healthcare Providers: GravelBags.it This test is not yet approved or cleared by the [place] FDA and  has been authorized for detection and/or diagnosis of SARS-CoV-2 by  FDA under an Emergency Use Authorization (EUA). This EUA will remain  in effect (meaning this test can be used) for the duration of the  Covid-19 declaration under Section 564(b)(1) of the Act, 21  U.S.C. section 360bbb-3(b)(1), unless the authorization is  terminated or revoked. Performed at [facility], [place] [address] [place], [place] [address]   Blood culture (routine x 2)     Status: None (Preliminary result)   Collection Time: 08/02/19  3:19 AM   Specimen: BLOOD  Result Value Ref Range Status   Specimen Description BLOOD LEFT ARM  Final   Special Requests   Final    BOTTLES DRAWN AEROBIC AND ANAEROBIC Blood Culture adequate volume Performed at [facility], [place] [address] [place], [place] [address]    Culture NO GROWTH 2 DAYS  Final    Report Status PENDING  Incomplete  Culture, Urine     Status: Abnormal (Preliminary result)   Collection Time: 08/03/19  5:17 PM   Specimen: Urine, Random  Result Value Ref Range Status   Specimen Description URINE, RANDOM  Final   Special Requests NONE  Final   Culture (A)  Final    >=100,000 COLONIES/mL GRAM NEGATIVE RODS IDENTIFICATION AND SUSCEPTIBILITIES TO FOLLOW Performed at [facility], [place] [address], [place], [place] [address]    Report Status PENDING  Incomplete     Studies: DG CHEST PORT 1 VIEW  Result Date: 08/04/2019 CLINICAL DATA:  Post thoracentesis EXAM: PORTABLE CHEST 1 VIEW COMPARISON:  08/02/2019 FINDINGS: Stable heart size, which is mildly enlarged. Calcified thoracic aorta. Interval decrease in volume of bilateral pleural effusions with possible trace residual left-sided pleural fluid. Patchy opacities in the left mid to lower lung and right lung base, which may reflect atelectasis or pneumonia. There is a small right-sided pneumothorax, less than 10%. No left-sided pneumothorax. IMPRESSION: 1. Small right-sided pneumothorax, less than 10%. 2. Interval decrease in volume of bilateral pleural effusions. 3. Patchy opacities in the left mid to lower lung and right lung base, which may reflect atelectasis or pneumonia. These results will be called to the ordering clinician or representative by the Radiologist Assistant, and communication documented in the PACS or zVision Dashboard. Electronically Signed   By: Davina Poke D.O.   On: 08/04/2019 13:05   ECHOCARDIOGRAM COMPLETE  Result Date: 08/02/2019   ECHOCARDIOGRAM REPORT   Patient Name:   SANSKAR MANDELL Date of Exam: 08/02/2019 Medical Rec #:  [id number]        Height:       67.0 in Accession #:    [id number]       Weight:       141.5 lb Date of Birth:  11/06/1924        BSA:          1.75 m Patient Age:    63 years         BP:  109/64 mmHg Patient Gender: M                HR:           86 bpm. Exam  Location:  Inpatient Procedure: 2D Echo, 3D Echo, Color Doppler and Cardiac Doppler Indications:    [id number] Acute diastolic (congestive) heart failure, Mitral Valve                 Stenosis 105.0  History:        Patient has prior history of Echocardiogram examinations, most                 recent 07/18/2017. CHF, COPD, Arrythmias:Atrial Fibrillation;                 Risk Factors:Hypertension and Dyslipidemia.  Sonographer:    Raquel Sarna Senior RDCS Referring Phys: [id number] [place]  1. Left ventricular ejection fraction, by visual estimation, is 55 to 60%. The left ventricle has normal function. There is no left ventricular hypertrophy.  2. The left ventricle has no regional wall motion abnormalities.  3. Global right ventricle was not well visualized.The right ventricular size is mildly enlarged. Right vetricular wall thickness was not assessed.  4. RV is difficult to see well Appears mildly dilated. Free wall is difficult to see to evaluate function adequately.  5. Left atrial size was severely dilated.  6. Right atrial size was severely dilated.  7. Large pleural effusion.  8. Moderate thickening of the mitral valve leaflet(s).  9. The mitral valve is abnormal. Mild to moderate mitral valve regurgitation. Mild mitral stenosis. 10. Mitral valve is thickened with restricted motion. Peak and mean gradients through the valve are 29 and 10 mm Hg respectively MVA by P T1/2 is approximately 1.5 cm 2 consistent with moderate MS. COmpared to report from January 2019 no significant change in mean gradient. 11. The tricuspid valve is normal in structure. 12. The tricuspid valve is normal in structure. Tricuspid valve regurgitation is moderate. 13. The aortic valve is abnormal. Aortic valve regurgitation is not visualized. 14. AV is thickened with restricted motion Peak and mean gradients are 15 and 7 mm Hg respectively LVOT/AV VTI ratio is 0.41 consistent with mild to moderate AS. 15. The pulmonic valve was  grossly normal. Pulmonic valve regurgitation is not visualized. 16. Moderately elevated pulmonary artery systolic pressure. 17. The inferior vena cava is normal in size with greater than 50% respiratory variability, suggesting right atrial pressure of 3 mmHg. 18. The interatrial septum was not assessed. FINDINGS  Left Ventricle: Left ventricular ejection fraction, by visual estimation, is 55 to 60%. The left ventricle has normal function. The left ventricle has no regional wall motion abnormalities. The left ventricular internal cavity size was the left ventricle is normal in size. There is no left ventricular hypertrophy. Right Ventricle: The right ventricular size is mildly enlarged. Right vetricular wall thickness was not assessed. Global RV systolic function is was not well visualized. The tricuspid regurgitant velocity is 3.85 m/s, and with an assumed right atrial pressure of 8 mmHg, the estimated right ventricular systolic pressure is moderately elevated at 67.3 mmHg. RV is difficult to see well Appears mildly dilated. Free wall is difficult to see to evaluate function adequately. Left Atrium: Left atrial size was severely dilated. Right Atrium: Right atrial size was severely dilated Pericardium: There is no evidence of pericardial effusion. There is a large pleural effusion. Mitral Valve: The mitral valve is abnormal. There is moderate thickening of  the mitral valve leaflet(s). Mildly decreased mobility of the mitral valve leaflets. Mild to moderate mitral valve regurgitation. Mild mitral valve stenosis by observation. MV peak gradient, 28.0 mmHg. Mitral valve is thickened with restricted motion. Peak and mean gradients through the valve are 29 and 10 mm Hg respectively MVA by P T1/2 is approximately 1.5 cm 2 consistent with moderate MS. COmpared to report from January 2019 no significant change in mean gradient. Tricuspid Valve: The tricuspid valve is normal in structure. Tricuspid valve regurgitation is  moderate. Aortic Valve: The aortic valve is abnormal. Aortic valve regurgitation is not visualized. Aortic valve mean gradient measures 7.0 mmHg. AV is thickened with restricted motion Peak and mean gradients are 15 and 7 mm Hg respectively LVOT/AV VTI ratio is 0.41 consistent with mild to moderate AS. Pulmonic Valve: The pulmonic valve was grossly normal. Pulmonic valve regurgitation is not visualized. Pulmonic regurgitation is not visualized. Aorta: The aortic root is normal in size and structure. Venous: The inferior vena cava is normal in size with greater than 50% respiratory variability, suggesting right atrial pressure of 3 mmHg. IAS/Shunts: The interatrial septum was not assessed.  LEFT VENTRICLE PLAX 2D LVIDd:         4.60 cm LVIDs:         3.20 cm LV PW:         0.70 cm LV IVS:        0.80 cm LVOT diam:     1.70 cm LV SV:         56 ml LV SV Index:   32.33 LVOT Area:     2.27 cm  RIGHT VENTRICLE RV S prime:     7.21 cm/s TAPSE (M-mode): 1.4 cm LEFT ATRIUM            Index       RIGHT ATRIUM           Index LA diam:      5.10 cm  2.92 cm/m  RA Area:     23.00 cm LA Vol (A2C): 81.0 ml  46.39 ml/m RA Volume:   70.40 ml  40.32 ml/m LA Vol (A4C): 108.0 ml 61.86 ml/m  AORTIC VALVE AV Mean Grad: 7.0 mmHg LVOT Vmax:    78.30 cm/s LVOT Vmean:   50.500 cm/s LVOT VTI:     0.159 m  AORTA Ao Root diam: 3.50 cm Ao Asc diam:  3.60 cm MITRAL VALVE               TRICUSPID VALVE MV Area (PHT): 1.69 cm    TR Peak grad:   59.3 mmHg MV Peak grad:  28.0 mmHg   TR Vmax:        385.00 cm/s MV Mean grad:  10.0 mmHg MV Vmax:       2.64 m/s    SHUNTS MV Vmean:      142.0 cm/s  Systemic VTI:  0.16 m MV VTI:        0.77 m      Systemic Diam: 1.70 cm MV PHT:        130.00 msec  Dorris Carnes MD Electronically signed by Dorris Carnes MD Signature Date/Time: 08/02/2019/5:09:34 PM    Final       Flora Lipps, MD  [facility] 08/04/2019

## 2019-08-05 ENCOUNTER — Inpatient Hospital Stay (HOSPITAL_COMMUNITY): Payer: Medicare Other

## 2019-08-05 LAB — URINE CULTURE: Culture: >=100000 — AB

## 2019-08-05 LAB — COMPREHENSIVE METABOLIC PANEL
ALT: 13 U/L (ref 0–44)
AST: 18 U/L (ref 15–41)
Albumin: 2.2 g/dL — ABNORMAL LOW (ref 3.5–5.0)
Alkaline Phosphatase: 65 U/L (ref 38–126)
Anion gap: 15 (ref 5–15)
BUN: 50 mg/dL — ABNORMAL HIGH (ref 8–23)
CO2: 36 mmol/L — ABNORMAL HIGH (ref 22–32)
Calcium: 8.9 mg/dL (ref 8.9–10.3)
Chloride: 93 mmol/L — ABNORMAL LOW (ref 98–111)
Creatinine, Ser: 1.25 mg/dL — ABNORMAL HIGH (ref 0.61–1.24)
GFR calc Af Amer: 58 mL/min — ABNORMAL LOW (ref >60)
GFR calc non Af Amer: 50 mL/min — ABNORMAL LOW (ref >60)
Glucose, Bld: 86 mg/dL (ref 70–99)
Potassium: 4.4 mmol/L (ref 3.5–5.1)
Sodium: 144 mmol/L (ref 135–145)
Total Bilirubin: 1.3 mg/dL — ABNORMAL HIGH (ref 0.3–1.2)
Total Protein: 6.3 g/dL — ABNORMAL LOW (ref 6.5–8.1)

## 2019-08-05 LAB — PHOSPHORUS: Phosphorus: 4.6 mg/dL (ref 2.5–4.6)

## 2019-08-05 LAB — PATHOLOGIST SMEAR REVIEW

## 2019-08-05 LAB — CBC
HCT: 32.1 % — ABNORMAL LOW (ref 39.0–52.0)
Hemoglobin: 9.5 g/dL — ABNORMAL LOW (ref 13.0–17.0)
MCH: 25.4 pg — ABNORMAL LOW (ref 26.0–34.0)
MCHC: 29.6 g/dL — ABNORMAL LOW (ref 30.0–36.0)
MCV: 85.8 fL (ref 80.0–100.0)
Platelets: 227 10*3/uL (ref 150–400)
RBC: 3.74 MIL/uL — ABNORMAL LOW (ref 4.22–5.81)
RDW: 18.4 % — ABNORMAL HIGH (ref 11.5–15.5)
WBC: 12.4 10*3/uL — ABNORMAL HIGH (ref 4.0–10.5)
nRBC: 0 % (ref 0.0–0.2)

## 2019-08-05 LAB — PROTIME-INR
INR: 2.2 — ABNORMAL HIGH (ref 0.8–1.2)
Prothrombin Time: 23.9 seconds — ABNORMAL HIGH (ref 11.4–15.2)

## 2019-08-05 LAB — MAGNESIUM: Magnesium: 2.6 mg/dL — ABNORMAL HIGH (ref 1.7–2.4)

## 2019-08-05 MED ORDER — WARFARIN SODIUM 2.5 MG PO TABS
2.5000 mg | ORAL_TABLET | Freq: Once | ORAL | Status: AC
  Administered 2019-08-05: 2.5 mg via ORAL
  Filled 2019-08-05: qty 1

## 2019-08-05 NOTE — Progress Notes (Signed)
PROGRESS NOTE  Aaron Frye [id number] DOB: 1925/03/13 DOA: 08/02/2019 PCP: Nickola Major, MD   LOS: 3 days   Brief narrative:  As per HPI,  Aaron Frye is a 86 y.o. male with medical history significant of persistent atrial fibrillation on Coumadin, CVA, hypertension, hyperlipidemia, severe mitral stenosis with moderate mitral regurgitation, chronic diastolic congestive heart failure, pulmonary hypertension, COPD and restrictive lung disease due to kyphosis with chronic hypoxic respiratory failure on 2 L home oxygen presented to the ED via EMS for evaluation of shortness of breath.  Oxygen saturation 80% on room air, per EMS and improved to 96% on a nonrebreather. ED Course: Afebrile.  Tachypneic.  WBC count 20.8, chronically elevated.  Potassium 3.3.  Initial high-sensitivity troponin negative, repeat pending.  BNP 357.  Blood culture x2 pending.  SARS-CoV-2 PCR test negative.  Influenza panel negative.  Chest x-ray showed cardiomegaly with pleural effusions and pulmonary edema. Patient received aspirin 324 mg, IV Lasix 40 mg, and potassium supplementation.  Assessment/Plan:  Principal Problem:   Acute exacerbation of CHF (congestive heart failure) (HCC) Active Problems:   Atrial fibrillation, chronic (HCC)   Mitral stenosis   Acute on chronic respiratory failure (HCC)   COPD (chronic obstructive pulmonary disease) (HCC)   Pressure injury of skin   Pleural effusion, left   Pleural effusion, right   Aspiration pneumonia of right middle lobe due to regurgitated food ([place])  Acute on chronic diastolic congestive heart failure. History of severe mitral stenosis with moderate mitral regurgitation, pulmonary hypertension.  Chest x-ray with pleural effusion and pulmonary edema.  2D echocardiogram done on 08/02/2019 showed LV strain fraction of 55 to 60%.  Continue statin.  Patient has been seen by cardiology.  Diuretics as tolerated.  Bilateral pleural effusion with hypoxia..  Status post bilateral thoracocentesis by pulmonary on 08/04/2019 with removal of 1250 mL rusty fluid from the right lung and 1350 mL of rusty fluid from the left lung.  Pleural fluid shows features of transudative effusion.[name]  Post tapping there is note of 10% pneumothorax in the right lung which is likely to be basilar pneumothorax after thoracocentesis.  Patient is not in acute distress.  He is still on 4 L of oxygen by nasal cannula saturating well.[name]  COPD / restrictive lung disease, on 2 L of oxygen at baseline.  Currently on 4 L of oxygen by nasal cannula.  Initially required nonrebreather in the ED.  We will continue to monitor.  Patient is on a dysphagia 2 diet.  Try to wean oxygen as able  Left lateral foot ulceration.  Neuropathic ulcer.  Present on admission.  Seen by wound care.on boot at this time.  X-ray of the foot did not show any evidence of osteomyelitis.  Persistent atrial fibrillation on Coumadin.  Pharmacy to dose Coumadin.    On metoprolol succinate at home and patient does have borderline hypotension limiting its use.  INR is 2.5.  History of CVA .  Continue Coumadin.  No focal deficits.  Essential Hypertension.    Now borderline hypotensive.  Continue Lasix with holding parameters.  Metoprolol with holding parameters.  Will closely monitor vitals.  Leukocytosis on presentation.  WBC of 17.5, Appears to be chronically elevated.  Possibility of aspiration as well.   Seen by speech therapy and is considered to be moderate aspiration risk.  Dysphagia 2 diet has been advised.  ProCalcitonin at 0.8.  Influenza was negative.  Covid test was negative.  E. coli UTI so patient has  been started on Rocephin.  We will continue that.     E. coli UTI.  On Rocephin IV  Hypokalemia.  Mild.  Improved after replacement.    Ambulatory dysfunction. Walks with walker at baseline. Lives with his wife at home.  Physical therapy saw the patient and recommend home health on discharge.  Stage II  sacral pressure ulceration   Present on admission.  Continue wound care. Pressure Injury 08/02/19 Buttocks Right;Left Stage 2 -  Partial thickness loss of dermis presenting as a shallow open injury with a red, pink wound bed without slough. (Active)  08/02/19 1500  Location: Buttocks  Location Orientation: Right;Left  Staging: Stage 2 -  Partial thickness loss of dermis presenting as a shallow open injury with a red, pink wound bed without slough.  Wound Description (Comments):   Present on Admission: Yes    VTE Prophylaxis: Coumadin  Code Status: Full code.    Family Communication: I again spoke with the patient's daughter [name] on the phone and updated her about the clinical condition of the patient.  I spoke about palliative care consultation as well.    Disposition Plan: Home with home health likely in 1 to 2 days. Continue PT as tolerated.  Ooverall prognosis of the patient is poor but the patient's family wish to proceed with full code and current level of care.  I have discussed about palliative care involvement.  Patient's daughter is agreeable to discussing with palliative care.  I also called palliative care on-call regarding this.  Consultants:  Cardiology  Pulmonary  Palliative care  Procedures:  Bilateral thoracocentesis on 08/04/2019  Antibiotics: . Rocephin >2/2  Anti-infectives (From admission, onward)   Start     Dose/Rate Route Frequency Ordered Stop   08/04/19 1500  cefTRIAXone (ROCEPHIN) 1 g in sodium chloride 0.9 % 100 mL IVPB     1 g 200 mL/hr over 30 Minutes Intravenous Every 24 hours 08/04/19 1353       Subjective: Today, patient was seen and examined at bedside.  Patient feels extremely fatigued, weak and tired and refusing to participate with physical and speech therapy .complains of intermittent vague chest discomfort.  States that his breathing is slightly improved  Objective: Vitals:   08/05/19 1208 08/05/19 1329  BP: (!) 92/58 97/63  Pulse:   84  Resp:  (!) 22  Temp:    SpO2: 100% 100%    Intake/Output Summary (Last 24 hours) at 08/05/2019 1346 Last data filed at 08/05/2019 1331 Gross per 24 hour  Intake 717.24 ml  Output 350 ml  Net 367.24 ml   Filed Weights   08/03/19 0435 08/04/19 0004 08/05/19 0005  Weight: 59.2 kg 56.7 kg 53.5 kg   Body mass index is 19.04 kg/m.   Physical Exam: GENERAL: Patient is mildly somnolent but able to communicate.. Not in obvious distress.  Thinly built.  On Nasal cannula oxygen at 4 L/min.  Hard of hearing HENT: No scleral pallor or icterus. Pupils equally reactive to light. Oral mucosa is moist NECK: is supple, no obvious thyroid palpable. CHEST:   Diminished breath sounds bilaterally.  Mild rhonchi.  Kyphotic chest CVS: S1 and S2 heard, irregular rhythm.  ABDOMEN: Soft, non-tender, bowel sounds are present.  External urinary catheter in place.  EXTREMITIES: Left leg with boot.  Edema+, bilateral venous stasis. CNS: Cranial nerves are intact.  Moves all extremities. SKIN: warm and dry, left lower extremity ulceration.  Sacral stage II ulceration.  Data Review: I have personally reviewed  the following laboratory data and studies,  CBC: Recent Labs  Lab 08/02/19 0135 08/03/19 0212 08/04/19 0345 08/05/19 0524  WBC 20.8* 18.3* 17.5* 12.4*  NEUTROABS 18.0*  --   --   --   HGB 11.0* 8.8* 9.5* 9.5*  HCT 36.9* 29.0* 31.1* 32.1*  MCV 85.8 84.1 84.5 85.8  PLT 232 186 221 [id number]   Basic Metabolic Panel: Recent Labs  Lab 08/02/19 0135 08/02/19 0429 08/03/19 0212 08/04/19 0345 08/05/19 0524  NA 139  --  141 142 144  K 3.3*  --  4.1 4.4 4.4  CL 93*  --  94* 94* 93*  CO2 33*  --  33* 37* 36*  GLUCOSE 145*  --  107* 117* 86  BUN 23  --  30* 37* 50*  CREATININE 1.02  --  1.08 1.29* 1.25*  CALCIUM 8.6*  --  8.6* 9.0 8.9  MG  --  2.0 2.2  --  2.6*  PHOS  --   --  3.9  --  4.6   Liver Function Tests: Recent Labs  Lab 08/04/19 0345 08/04/19 1420 08/05/19 0524  AST 18  --  18    ALT 16  --  13  ALKPHOS 81  --  65  BILITOT 1.4*  --  1.3*  PROT 6.8 6.8 6.3*  ALBUMIN 2.2*  --  2.2*   No results for input(s): LIPASE, AMYLASE in the last 168 hours. No results for input(s): AMMONIA in the last 168 hours. Cardiac Enzymes: No results for input(s): CKTOTAL, CKMB, CKMBINDEX, TROPONINI in the last 168 hours. BNP (last 3 results) Recent Labs    08/02/19 0135 08/03/19 0212  BNP 357.0* 614.5*    ProBNP (last 3 results) No results for input(s): PROBNP in the last 8760 hours.  CBG: No results for input(s): GLUCAP in the last 168 hours. Recent Results (from the past 240 hour(s))  Blood culture (routine x 2)     Status: None (Preliminary result)   Collection Time: 08/02/19  1:35 AM   Specimen: BLOOD  Result Value Ref Range Status   Specimen Description BLOOD RIGHT ARM  Final   Special Requests   Final    BOTTLES DRAWN AEROBIC AND ANAEROBIC Blood Culture results may not be optimal due to an inadequate volume of blood received in culture bottles   Culture   Final    NO GROWTH 3 DAYS Performed at [facility], [place] [address], [place], [place] [address]    Report Status PENDING  Incomplete  Respiratory Panel by RT PCR (Flu A&B, Covid) - Nasopharyngeal Swab     Status: None   Collection Time: 08/02/19  1:43 AM   Specimen: Nasopharyngeal Swab  Result Value Ref Range Status   SARS Coronavirus 2 by RT PCR NEGATIVE NEGATIVE Final    Comment: (NOTE) SARS-CoV-2 target nucleic acids are NOT DETECTED. The SARS-CoV-2 RNA is generally detectable in upper respiratoy specimens during the acute phase of infection. The lowest concentration of SARS-CoV-2 viral copies this assay can detect is 131 copies/mL. A negative result does not preclude SARS-Cov-2 infection and should not be used as the sole basis for treatment or other patient management decisions. A negative result may occur with  improper specimen collection/handling, submission of specimen other than  nasopharyngeal swab, presence of viral mutation(s) within the areas targeted by this assay, and inadequate number of viral copies (<131 copies/mL). A negative result must be combined with clinical observations, patient history, and epidemiological information. The expected  result is Negative. Fact Sheet for Patients:  PinkCheek.be Fact Sheet for Healthcare Providers:  GravelBags.it This test is not yet ap proved or cleared by the [place] FDA and  has been authorized for detection and/or diagnosis of SARS-CoV-2 by FDA under an Emergency Use Authorization (EUA). This EUA will remain  in effect (meaning this test can be used) for the duration of the COVID-19 declaration under Section 564(b)(1) of the Act, 21 U.S.C. section 360bbb-3(b)(1), unless the authorization is terminated or revoked sooner.    Influenza A by PCR NEGATIVE NEGATIVE Final   Influenza B by PCR NEGATIVE NEGATIVE Final    Comment: (NOTE) The Xpert Xpress SARS-CoV-2/FLU/RSV assay is intended as an aid in  the diagnosis of influenza from Nasopharyngeal swab specimens and  should not be used as a sole basis for treatment. Nasal washings and  aspirates are unacceptable for Xpert Xpress SARS-CoV-2/FLU/RSV  testing. Fact Sheet for Patients: PinkCheek.be Fact Sheet for Healthcare Providers: GravelBags.it This test is not yet approved or cleared by the [place] FDA and  has been authorized for detection and/or diagnosis of SARS-CoV-2 by  FDA under an Emergency Use Authorization (EUA). This EUA will remain  in effect (meaning this test can be used) for the duration of the  Covid-19 declaration under Section 564(b)(1) of the Act, 21  U.S.C. section 360bbb-3(b)(1), unless the authorization is  terminated or revoked. Performed at [facility], [place] [address], [place], [place] [address]   Blood  culture (routine x 2)     Status: None (Preliminary result)   Collection Time: 08/02/19  3:19 AM   Specimen: BLOOD  Result Value Ref Range Status   Specimen Description BLOOD LEFT ARM  Final   Special Requests   Final    BOTTLES DRAWN AEROBIC AND ANAEROBIC Blood Culture adequate volume   Culture   Final    NO GROWTH 3 DAYS Performed at [facility], [address] [place],  [address]    Report Status PENDING  Incomplete  Culture, Urine     Status: Abnormal   Collection Time: 08/03/19  5:17 PM   Specimen: Urine, Random  Result Value Ref Range Status   Specimen Description URINE, RANDOM  Final   Special Requests   Final    NONE Performed at [facility], [place] [address], [place],  [address]    Culture >=100,000 COLONIES/mL ESCHERICHIA COLI (A)  Final   Report Status 08/05/2019 FINAL  Final   Organism ID, Bacteria ESCHERICHIA COLI (A)  Final      Susceptibility   Escherichia coli - MIC*    AMPICILLIN >=32 RESISTANT Resistant     CEFAZOLIN <=4 SENSITIVE Sensitive     CEFTRIAXONE <=0.25 SENSITIVE Sensitive     CIPROFLOXACIN >=4 RESISTANT Resistant     GENTAMICIN <=1 SENSITIVE Sensitive     IMIPENEM <=0.25 SENSITIVE Sensitive     NITROFURANTOIN <=16 SENSITIVE Sensitive     TRIMETH/SULFA >=320 RESISTANT Resistant     AMPICILLIN/SULBACTAM 16 INTERMEDIATE Intermediate     PIP/TAZO <=4 SENSITIVE Sensitive     * >=100,000 COLONIES/mL ESCHERICHIA COLI  Body fluid culture     Status: None (Preliminary result)   Collection Time: 08/04/19 12:49 PM   Specimen: Pleura; Body Fluid  Result Value Ref Range Status   Specimen Description PLEURAL LEFT  Final   Special Requests NONE  Final   Gram Stain   Final    ABUNDANT WBC PRESENT, PREDOMINANTLY PMN NO  ORGANISMS SEEN    Culture   Final    NO GROWTH < 24 HOURS Performed at [facility], [place] [address] [place], [place] [address]    Report Status PENDING  Incomplete  Body fluid culture     Status: None  (Preliminary result)   Collection Time: 08/04/19 12:50 PM   Specimen: Pleura; Body Fluid  Result Value Ref Range Status   Specimen Description PLEURAL RIGHT  Final   Special Requests NONE  Final   Gram Stain   Final    ABUNDANT WBC PRESENT,BOTH PMN AND MONONUCLEAR NO ORGANISMS SEEN    Culture   Final    NO GROWTH < 24 HOURS Performed at [facility], [place] [address], [place], [place] [address]    Report Status PENDING  Incomplete     Studies: DG Chest Port 1 View  Result Date: 08/05/2019 CLINICAL DATA:  Right pneumothorax. EXAM: PORTABLE CHEST 1 VIEW COMPARISON:  09/01/2019 FINDINGS: Small right sided pneumothorax shows interval decrease in the interval. Small right pleural effusion is progressive in the interval. Probable tiny left effusion. Diffuse interstitial and patchy areas of airspace disease bilaterally are similar. The cardio pericardial silhouette is enlarged. Bones are diffusely demineralized. IMPRESSION: Interval decrease in small right pleural pneumothorax with small right pleural effusion now evident. Similar appearance of the diffuse interstitial and patchy alveolar opacity. Electronically Signed   By: Misty Stanley M.D.   On: 08/05/2019 09:20   DG Chest Port 1 View  Result Date: 08/04/2019 CLINICAL DATA:  Follow-up pneumothorax. EXAM: PORTABLE CHEST 1 VIEW COMPARISON:  August 04, 2019 FINDINGS: Mild chronic appearing increased interstitial lung markings are again seen and unchanged in appearance. Mild infiltrates are again seen within the mid left lung and bilateral lung bases. There is no evidence of a pleural effusion. A small, stable right-sided pneumothorax is noted. The cardiac silhouette is markedly enlarged. Marked severity calcification of the thoracic aorta is seen. Multilevel degenerative changes are noted throughout the thoracic spine. IMPRESSION: 1. No significant interval change when compared to the prior chest plain film, dated August 04, 2019 (acquired at  12:55 p.m.). Electronically Signed   By: Virgina Norfolk M.D.   On: 08/04/2019 16:49   DG CHEST PORT 1 VIEW  Result Date: 08/04/2019 CLINICAL DATA:  Post thoracentesis EXAM: PORTABLE CHEST 1 VIEW COMPARISON:  08/02/2019 FINDINGS: Stable heart size, which is mildly enlarged. Calcified thoracic aorta. Interval decrease in volume of bilateral pleural effusions with possible trace residual left-sided pleural fluid. Patchy opacities in the left mid to lower lung and right lung base, which may reflect atelectasis or pneumonia. There is a small right-sided pneumothorax, less than 10%. No left-sided pneumothorax. IMPRESSION: 1. Small right-sided pneumothorax, less than 10%. 2. Interval decrease in volume of bilateral pleural effusions. 3. Patchy opacities in the left mid to lower lung and right lung base, which may reflect atelectasis or pneumonia. These results will be called to the ordering clinician or representative by the Radiologist Assistant, and communication documented in the PACS or zVision Dashboard. Electronically Signed   By: Davina Poke D.O.   On: 08/04/2019 13:05    Flora Lipps, MD  [facility] 08/05/2019

## 2019-08-05 NOTE — Progress Notes (Signed)
[facility] for Warfarin  Indication: atrial fibrillation  Allergies  Allergen Reactions  . Penicillins Rash and Other (See Comments)    Has patient had a PCN reaction causing immediate rash, facial/tongue/throat swelling, SOB or lightheadedness with hypotension: Yes Has patient had a PCN reaction causing severe rash involving mucus membranes or skin necrosis: Unk Has patient had a PCN reaction that required hospitalization: No Has patient had a PCN reaction occurring within the last 10 years: No If all of the above answers are "NO", then may proceed with Cephalosporin use.     Vital Signs: Temp: 97.6 F (36.4 C) (02/03 0729) Temp Source: Oral (02/03 0729) BP: 94/75 (02/03 0800) Pulse Rate: 89 (02/03 0800)  Labs: Recent Labs    08/03/19 0212 08/03/19 0212 08/04/19 0345 08/05/19 0524  HGB 8.8*   < > 9.5* 9.5*  HCT 29.0*  --  31.1* 32.1*  PLT 186  --  221 227  LABPROT 38.9*  --  27.1* 23.9*  INR 4.0*  --  2.5* 2.2*  CREATININE 1.08  --  1.29* 1.25*   < > = values in this interval not displayed.    Estimated Creatinine Clearance: 28.5 mL/min (A) (by C-G formula based on SCr of 1.25 mg/dL (H)).   Assessment: 86 y/o M on warfarin PTA for afib admitted with CHF exacerbation. PTA warfarin dose - 2.5 mg daily except 5 mg on Mon, Fri, last dose taken 1/30 PTA.  INR 3.1 on admit, trended up to 4.0 on 2/1, now back down to 2.2 (doses held 1/31 and 2/1). CBC stable. No active bleed issues documented.  Goal of Therapy:  INR 2-3 Monitor platelets by anticoagulation protocol: Yes   Plan:  Continue home dose - warfarin 2.5mg  PO x 1 Monitor daily INR, CBC, s/sx bleeding   Elicia Lamp, PharmD, BCPS Please check AMION for all [facility] contact numbers Clinical Pharmacist 08/05/2019 9:34 AM

## 2019-08-05 NOTE — Progress Notes (Signed)
During morning assessment, patient asleep but aroused by RN with touch and interaction. Patient able to follow commands and when asked indicated wanted to wait to eat breakfast.  Paged [facility] 1038. Pt. BP remains 90's to low 100's over 60's. HR 80-90's. Ok to hold scheduled 75mg  Metoprolol? Upon callback at [id number], hold if systolic [id number].   Observed daughter assisting with feeding, patient appeared to have poor appetite, poor desire to eat and delayed swallowing (mechanical). Patient coughed and complained of epigastric/chest pain (pain with palpation), patient then refused food. No changes to rhythm and O2, confirmed with tele to rhythm changes nor HR changes.   Paged [facility] at 1215. Pt. BP remains in mid to low [id number] systolic [id number], [id number], [id number]. will hold Metoprolol. please call back. Upon callback at 1217 informed ok to hold meltoprolol. Informed of concerns about eating with chest discomfort and overall   1325 aroused by RN, able to follow commands, no chest pain/discomfort upon palpation and shook head "no" when asked. Inquired if belly tender to palpation patient nodded head "yes." RN inquired if abdomen hurt due to eating, patient nodded "yes."  Patient declined med for stomach pain.   1508 spoke with daughter at bedside, somewhat tearfusl and expressed understanding of patient's condition and poor prognosis.   1800  bserved swallowing at dinner while daugher assisted with eating. Delayed swallowing, some coughing. Educated to only provide small bites as tolerated.   Paged [facility] at [employer]. FYI: increased difficulty swallowing, coughing, delayed mechanical swallowing, worsening appetite.  Per pharm at 1900; ok to crush Coumadin.

## 2019-08-05 NOTE — Progress Notes (Addendum)
NAME:  Aaron Frye, MRN:  [id number], DOB:  [birth year], LOS: 3 ADMISSION DATE:  08/02/2019, CONSULTATION DATE:  2/2 REFERRING MD:  Pokhrel, CHIEF COMPLAINT:  Pleural effusion    Brief History   86 year old male patient with significant cardiac history including chronic diastolic dysfunction, atrial fibrillation, and severe valvular disease.  Admitted with acute decompensated heart failure with acute pulmonary edema and bilateral pleural effusions 1/31.  Treated with IV diuresis, in spite of this had significant oxygen requirements and rising serum creatinine with chest x-ray continuing to demonstrate bilateral pleural effusions because of this pulmonary asked to evaluate for pleural effusions.  Past Medical History  Atrial fib on coumadin, CVA, HTN, severe MS, MR and chronic diastolic dysfxn. PH, COPD, kyphosis and chronic oxygen dep resp failure due to this.    Significant Hospital Events   1/31 admitted 2/2 pccm consulted bilateral thoracentesis completed at bedside  Consults:  Cards consulted 2/1 Pulm consulted 2/2  Procedures:  Bilateral thoracentesis Right chest, 1450 mL concentrated rust colored pleural fluid Left chest, 1350 mL concentrated rust colored pleural fluid  Significant Diagnostic Tests:  Echo 1/31: 1. Left ventricular ejection fraction, by visual estimation, is 55 to  60%. The left ventricle has normal function. There is no left ventricular  hypertrophy. 2. The left ventricle has no regional wall motion abnormalities.  3. Global right ventricle was not well visualized.The right ventricular size is mildly enlarged. Right vetricular wall thickness was not assessed. 4. RV is difficult to see well Appears mildly dilated. Free wall is difficult to see to evaluate function adequately.5. Left atrial size was severely dilated.  6. Right atrial size was severely dilated. 7. Large pleural effusion.  Micro Data:  1/31 BCX2>>> 2/2: left thora>>> 2/2 right  thora>>> Antimicrobials:    Interim history/subjective:  He feels much better status post thoracentesis Objective   Blood pressure 94/75, pulse 89, temperature 97.6 F (36.4 C), temperature source Oral, resp. rate (Abnormal) 23, height 5\' 6"  (1.676 m), weight 53.5 kg, SpO2 100 %.        Intake/Output Summary (Last 24 hours) at 08/05/2019 [id number] Last data filed at 08/05/2019 [id number] Gross per 24 hour  Intake 790.24 ml  Output 200 ml  Net 590.24 ml   Filed Weights   08/03/19 0435 08/04/19 0004 08/05/19 0005  Weight: 59.2 kg 56.7 kg 53.5 kg    Examination: General this is a 86 year old white male he is resting in bed and currently in no acute distress HEENT normocephalic atraumatic mucous membranes are moist no JVD Pulmonary: Scattered basilar rhonchi, kyphotic chest.  No wheezing or rhonchi, currently on 5 L/min via nasal cannula Cardiac: Regular rhythm, systolic murmur appreciated Abdomen: Soft not tender no organomegaly Extremities: Chronic venous stasis changes, pulses are palpable Neuro: Awake oriented no focal deficits he is however very hard of hearing GU: Voids. Resolved Hospital Problem list      Assessment & Plan:  Acute on chronic hypoxic respiratory failure in setting of acute diastolic heart failure w/ acute pulmonary edema and R>left pleural effusions superimposed on underlying COPD and also restrictive physiology 2/2 kyphosis. Chronic AF on Coumadin Acute on chronic diastolic heart failure Valvular heart disease HTN AKI  Leukocytosis Left foot ulcer  H/o CVA Fluid and electrolyte imbalance  Pulmonary problem list  Acute on chronic hypoxic respiratory failure in setting of acute diastolic heart failure w/ acute pulmonary edema and R>left pleural effusions superimposed on underlying COPD and also restrictive physiology 2/2 kyphosis.  Small right Pneumothorax.  Favor trapped lung over iatrogenic PTX S/p bilateral thoracentesis 2/2. -->transudate by Lights criteria  on both sides. Grossly had appearance of chronic process and almost certainly due to heart failure.   pcxr from last evening personally reviewed. Small right sided PTX no sig change. <10%. Marked reduction of pleural fluid. Basilar residual atx.  Chest x-ray reviewed from this morning shows essentially resolution of pneumothorax, that space is now filled with fluid as expected  Plan F/u cultures and cytology for completeness Repeat CXR today-->I anticipate that the right pleural space will likely re-collect fluid  Re-assess diuretic regimen. Now that hopefully closer to "dry weight" status the hope would be we can keep him here w/ diuretics. Future thoracentesis would only be appropriate for palliative symptom management should diuretics fail.    Ideally going forward manage the heart failure. I see he is full code still. He has advanced disease.  We will place palliative care consult, critical care/pulmonary signing off  Erick Colace ACNP-BC [place] Pager # [phone] OR # [phone] if no answer

## 2019-08-05 NOTE — Progress Notes (Signed)
SLP Cancellation Note  Patient Details Name: Aaron Frye MRN: [id number] DOB: 10-May-1925   Cancelled treatment:       Reason Eval/Treat Not Completed: Patient declined, no reason specified(Pt was approached for treatment with his daughter present. She reported that the pt has been "worn out" from everything and that he does not want to eat anything at this time since he is so tired. SLP will follow up on subsequent date.)  Liani Caris I. Hardin Negus, [place], [place] Office number [phone] Pager [phone]  Horton Marshall 08/05/2019, 12:24 PM

## 2019-08-05 NOTE — Progress Notes (Signed)
Palliative-  Consult received, chart reviewed. Called patient's daughterManuela Schwartz. Left message req return call to schedule meeting.   Mariana Kaufman, AGNP-C Palliative Medicine  Please call Palliative Medicine team phone with any questions [phone]. For individual providers please see AMION.  No charge

## 2019-08-05 NOTE — Progress Notes (Signed)
Progress Note  Patient Name: Aaron Frye Date of Encounter: 08/05/2019  Primary Cardiologist:  Jenkins Rouge, MD  Subjective   Did not want to eat or take meds this am    Inpatient Medications    Scheduled Meds:  atorvastatin  40 mg Oral q1800   collagenase   Topical Daily   feeding supplement (ENSURE ENLIVE)  237 mL Oral BID BM   furosemide  40 mg Intravenous BID   metoprolol succinate  75 mg Oral Daily   multivitamin with minerals  1 tablet Oral Daily   sodium chloride flush  3 mL Intravenous Q12H   Warfarin - Pharmacist Dosing Inpatient   Does not apply q1800   Continuous Infusions:  sodium chloride Stopped (08/04/19 1852)   cefTRIAXone (ROCEPHIN)  IV 1 g (08/04/19 1538)   PRN Meds: sodium chloride, acetaminophen, ipratropium-albuterol, Resource ThickenUp Clear, sodium chloride flush   Vital Signs    Vitals:   08/05/19 0700 08/05/19 0729 08/05/19 0756 08/05/19 0800  BP: 95/61 (!) 87/50 101/60 94/75  Pulse: 90 78 87 89  Resp: 18 18 (!) 22 (!) 23  Temp:  97.6 F (36.4 C)    TempSrc:  Oral    SpO2: 100% 100% 100% 100%  Weight:      Height:        Intake/Output Summary (Last 24 hours) at 08/05/2019 0855 Last data filed at 08/05/2019 0339 Gross per 24 hour  Intake 790.24 ml  Output 200 ml  Net 590.24 ml   Filed Weights   08/03/19 0435 08/04/19 0004 08/05/19 0005  Weight: 59.2 kg 56.7 kg 53.5 kg   Last Weight  Most recent update: 08/05/2019 12:05 AM   Weight  53.5 kg (117 lb 15.1 oz)           Weight change: -3.2 kg   Telemetry    Atrial fib, rate ok - Personally Reviewed  ECG    None today - Personally Reviewed  Physical Exam   Physical Exam: Blood pressure 94/75, pulse 89, temperature 97.6 F (36.4 C), temperature source Oral, resp. rate (!) 23, height 5\' 6"  (1.676 m), weight 53.5 kg, SpO2 100 %.  GEN:  Chronically ill appearing man , very weak  HEENT: Normal NECK: No JVD; No carotid bruits LYMPHATICS: No  lymphadenopathy CARDIAC: Irreg. Irreg.  RESPIRATORY:    ABDOMEN: Soft, non-tender, non-distended MUSCULOSKELETAL:  No edema; No deformity  SKIN: Warm and dry NEUROLOGIC:  Alert and oriented x 3    Labs    Hematology Recent Labs  Lab 08/03/19 0212 08/04/19 0345 08/05/19 0524  WBC 18.3* 17.5* 12.4*  RBC 3.45* 3.68* 3.74*  HGB 8.8* 9.5* 9.5*  HCT 29.0* 31.1* 32.1*  MCV 84.1 84.5 85.8  MCH 25.5* 25.8* 25.4*  MCHC 30.3 30.5 29.6*  RDW 18.3* 18.6* 18.4*  PLT 186 221 227    Chemistry Recent Labs  Lab 08/03/19 0212 08/04/19 0345 08/04/19 1420 08/05/19 0524  NA 141 142  --  144  K 4.1 4.4  --  4.4  CL 94* 94*  --  93*  CO2 33* 37*  --  36*  GLUCOSE 107* 117*  --  86  BUN 30* 37*  --  50*  CREATININE 1.08 1.29*  --  1.25*  CALCIUM 8.6* 9.0  --  8.9  PROT  --  6.8 6.8 6.3*  ALBUMIN  --  2.2*  --  2.2*  AST  --  18  --  18  ALT  --  16  --  13  ALKPHOS  --  81  --  65  BILITOT  --  1.4*  --  1.3*  GFRNONAA 59* 48*  --  50*  GFRAA >60 55*  --  58*  ANIONGAP 14 11  --  15     High Sensitivity Troponin:   Recent Labs  Lab 08/02/19 0135 08/02/19 0354  TROPONINIHS 13 18*      BNP Recent Labs  Lab 08/02/19 0135 08/03/19 0212  BNP 357.0* 614.5*     Radiology    DG Chest Port 1 View  Result Date: 08/04/2019 CLINICAL DATA:  Follow-up pneumothorax. EXAM: PORTABLE CHEST 1 VIEW COMPARISON:  August 04, 2019 FINDINGS: Mild chronic appearing increased interstitial lung markings are again seen and unchanged in appearance. Mild infiltrates are again seen within the mid left lung and bilateral lung bases. There is no evidence of a pleural effusion. A small, stable right-sided pneumothorax is noted. The cardiac silhouette is markedly enlarged. Marked severity calcification of the thoracic aorta is seen. Multilevel degenerative changes are noted throughout the thoracic spine. IMPRESSION: 1. No significant interval change when compared to the prior chest plain film, dated  August 04, 2019 (acquired at 12:55 p.m.). Electronically Signed   By: Virgina Norfolk M.D.   On: 08/04/2019 16:49   DG CHEST PORT 1 VIEW  Result Date: 08/04/2019 CLINICAL DATA:  Post thoracentesis EXAM: PORTABLE CHEST 1 VIEW COMPARISON:  08/02/2019 FINDINGS: Stable heart size, which is mildly enlarged. Calcified thoracic aorta. Interval decrease in volume of bilateral pleural effusions with possible trace residual left-sided pleural fluid. Patchy opacities in the left mid to lower lung and right lung base, which may reflect atelectasis or pneumonia. There is a small right-sided pneumothorax, less than 10%. No left-sided pneumothorax. IMPRESSION: 1. Small right-sided pneumothorax, less than 10%. 2. Interval decrease in volume of bilateral pleural effusions. 3. Patchy opacities in the left mid to lower lung and right lung base, which may reflect atelectasis or pneumonia. These results will be called to the ordering clinician or representative by the Radiologist Assistant, and communication documented in the PACS or zVision Dashboard. Electronically Signed   By: Davina Poke D.O.   On: 08/04/2019 13:05   DG Chest Portable 1 View  Result Date: 08/02/2019 CLINICAL DATA:  Shortness of breath. Hypoxia. EXAM: PORTABLE CHEST 1 VIEW COMPARISON:  Radiograph 04/13/2019, 10/20/2017. FINDINGS: Chronic cardiomegaly, possibly increased from prior. Aortic atherosclerosis. Moderate right and small left pleural effusion, slightly increased from prior exam. Adjacent bibasilar airspace disease favors atelectasis. Reticular opacities with increased septal thickening at the bases suspicious for pulmonary edema. No pneumothorax. Bones are diffusely under mineralized. IMPRESSION: 1. CHF pattern. Cardiomegaly with pleural effusions and pulmonary edema. Findings have progressed from 04/13/2019 radiograph. 2.  Aortic Atherosclerosis (ICD10-I70.0). Electronically Signed   By: Keith Rake M.D.   On: 08/02/2019 02:10   DG  Foot 2 Views Left  Result Date: 08/02/2019 CLINICAL DATA:  Lateral foot ulcer at the metatarsal area. EXAM: LEFT FOOT - 2 VIEW COMPARISON:  None. FINDINGS: Regional arterial calcification. No plain radiographic evidence of osteomyelitis or pathologic fracture. Mild regional osteopenia. IMPRESSION: No plain radiographic sign of osteomyelitis.  No fracture. Electronically Signed   By: Nelson Chimes M.D.   On: 08/02/2019 05:57   ECHOCARDIOGRAM COMPLETE  Result Date: 08/02/2019   ECHOCARDIOGRAM REPORT   Patient Name:   ZEF BRUSSO Date of Exam: 08/02/2019 Medical Rec #:  [id number]  Height:       67.0 in Accession #:    [id number]       Weight:       141.5 lb Date of Birth:  1925-06-17        BSA:          1.75 m Patient Age:    75 years         BP:           109/64 mmHg Patient Gender: M                HR:           86 bpm. Exam Location:  Inpatient Procedure: 2D Echo, 3D Echo, Color Doppler and Cardiac Doppler Indications:    [id number] Acute diastolic (congestive) heart failure, Mitral Valve                 Stenosis 105.0  History:        Patient has prior history of Echocardiogram examinations, most                 recent 07/18/2017. CHF, COPD, Arrythmias:Atrial Fibrillation;                 Risk Factors:Hypertension and Dyslipidemia.  Sonographer:    Raquel Sarna Senior RDCS Referring Phys: [id number] [place]  1. Left ventricular ejection fraction, by visual estimation, is 55 to 60%. The left ventricle has normal function. There is no left ventricular hypertrophy.  2. The left ventricle has no regional wall motion abnormalities.  3. Global right ventricle was not well visualized.The right ventricular size is mildly enlarged. Right vetricular wall thickness was not assessed.  4. RV is difficult to see well Appears mildly dilated. Free wall is difficult to see to evaluate function adequately.  5. Left atrial size was severely dilated.  6. Right atrial size was severely dilated.  7. Large pleural  effusion.  8. Moderate thickening of the mitral valve leaflet(s).  9. The mitral valve is abnormal. Mild to moderate mitral valve regurgitation. Mild mitral stenosis. 10. Mitral valve is thickened with restricted motion. Peak and mean gradients through the valve are 29 and 10 mm Hg respectively MVA by P T1/2 is approximately 1.5 cm 2 consistent with moderate MS. COmpared to report from January 2019 no significant change in mean gradient. 11. The tricuspid valve is normal in structure. 12. The tricuspid valve is normal in structure. Tricuspid valve regurgitation is moderate. 13. The aortic valve is abnormal. Aortic valve regurgitation is not visualized. 14. AV is thickened with restricted motion Peak and mean gradients are 15 and 7 mm Hg respectively LVOT/AV VTI ratio is 0.41 consistent with mild to moderate AS. 15. The pulmonic valve was grossly normal. Pulmonic valve regurgitation is not visualized. 16. Moderately elevated pulmonary artery systolic pressure. 17. The inferior vena cava is normal in size with greater than 50% respiratory variability, suggesting right atrial pressure of 3 mmHg. 18. The interatrial septum was not assessed. FINDINGS  Left Ventricle: Left ventricular ejection fraction, by visual estimation, is 55 to 60%. The left ventricle has normal function. The left ventricle has no regional wall motion abnormalities. The left ventricular internal cavity size was the left ventricle is normal in size. There is no left ventricular hypertrophy. Right Ventricle: The right ventricular size is mildly enlarged. Right vetricular wall thickness was not assessed. Global RV systolic function is was not well visualized. The tricuspid regurgitant velocity is 3.85 m/s, and with an assumed  right atrial pressure of 8 mmHg, the estimated right ventricular systolic pressure is moderately elevated at 67.3 mmHg. RV is difficult to see well Appears mildly dilated. Free wall is difficult to see to evaluate function  adequately. Left Atrium: Left atrial size was severely dilated. Right Atrium: Right atrial size was severely dilated Pericardium: There is no evidence of pericardial effusion. There is a large pleural effusion. Mitral Valve: The mitral valve is abnormal. There is moderate thickening of the mitral valve leaflet(s). Mildly decreased mobility of the mitral valve leaflets. Mild to moderate mitral valve regurgitation. Mild mitral valve stenosis by observation. MV peak gradient, 28.0 mmHg. Mitral valve is thickened with restricted motion. Peak and mean gradients through the valve are 29 and 10 mm Hg respectively MVA by P T1/2 is approximately 1.5 cm 2 consistent with moderate MS. COmpared to report from January 2019 no significant change in mean gradient. Tricuspid Valve: The tricuspid valve is normal in structure. Tricuspid valve regurgitation is moderate. Aortic Valve: The aortic valve is abnormal. Aortic valve regurgitation is not visualized. Aortic valve mean gradient measures 7.0 mmHg. AV is thickened with restricted motion Peak and mean gradients are 15 and 7 mm Hg respectively LVOT/AV VTI ratio is 0.41 consistent with mild to moderate AS. Pulmonic Valve: The pulmonic valve was grossly normal. Pulmonic valve regurgitation is not visualized. Pulmonic regurgitation is not visualized. Aorta: The aortic root is normal in size and structure. Venous: The inferior vena cava is normal in size with greater than 50% respiratory variability, suggesting right atrial pressure of 3 mmHg. IAS/Shunts: The interatrial septum was not assessed.  LEFT VENTRICLE PLAX 2D LVIDd:         4.60 cm LVIDs:         3.20 cm LV PW:         0.70 cm LV IVS:        0.80 cm LVOT diam:     1.70 cm LV SV:         56 ml LV SV Index:   32.33 LVOT Area:     2.27 cm  RIGHT VENTRICLE RV S prime:     7.21 cm/s TAPSE (M-mode): 1.4 cm LEFT ATRIUM            Index       RIGHT ATRIUM           Index LA diam:      5.10 cm  2.92 cm/m  RA Area:     23.00 cm LA  Vol (A2C): 81.0 ml  46.39 ml/m RA Volume:   70.40 ml  40.32 ml/m LA Vol (A4C): 108.0 ml 61.86 ml/m  AORTIC VALVE AV Mean Grad: 7.0 mmHg LVOT Vmax:    78.30 cm/s LVOT Vmean:   50.500 cm/s LVOT VTI:     0.159 m  AORTA Ao Root diam: 3.50 cm Ao Asc diam:  3.60 cm MITRAL VALVE               TRICUSPID VALVE MV Area (PHT): 1.69 cm    TR Peak grad:   59.3 mmHg MV Peak grad:  28.0 mmHg   TR Vmax:        385.00 cm/s MV Mean grad:  10.0 mmHg MV Vmax:       2.64 m/s    SHUNTS MV Vmean:      142.0 cm/s  Systemic VTI:  0.16 m MV VTI:        0.77 m      Systemic  Diam: 1.70 cm MV PHT:        130.00 msec  Dorris Carnes MD Electronically signed by Dorris Carnes MD Signature Date/Time: 08/02/2019/5:09:34 PM    Final      Cardiac Studies   ECHO:  08/03/2019 1. Left ventricular ejection fraction, by visual estimation, is 55 to  60%. The left ventricle has normal function. There is no left ventricular  hypertrophy.  2. The left ventricle has no regional wall motion abnormalities.  3. Global right ventricle was not well visualized.The right ventricular  size is mildly enlarged. Right vetricular wall thickness was not assessed.  4. RV is difficult to see well Appears mildly dilated. Free wall is  difficult to see to evaluate function adequately.  5. Left atrial size was severely dilated.  6. Right atrial size was severely dilated.  7. Large pleural effusion.  8. Moderate thickening of the mitral valve leaflet(s).  9. The mitral valve is abnormal. Mild to moderate mitral valve  regurgitation. Mild mitral stenosis.  10. Mitral valve is thickened with restricted motion. Peak and mean  gradients through the valve are 29 and 10 mm Hg respectively MVA by P T1/2  is approximately 1.5 cm 2 consistent with moderate MS. COmpared to report  from January 2019 no significant  change in mean gradient.  11. The tricuspid valve is normal in structure.  12. The tricuspid valve is normal in structure. Tricuspid valve   regurgitation is moderate.  13. The aortic valve is abnormal. Aortic valve regurgitation is not  visualized.  14. AV is thickened with restricted motion Peak and mean gradients are 15  and 7 mm Hg respectively LVOT/AV VTI ratio is 0.41 consistent with mild to  moderate AS.  15. The pulmonic valve was grossly normal. Pulmonic valve regurgitation is  not visualized.  16. Moderately elevated pulmonary artery systolic pressure.  17. The inferior vena cava is normal in size with greater than 50%  respiratory variability, suggesting right atrial pressure of 3 mmHg.  18. The interatrial septum was not assessed.   Patient Profile     86 y.o. male w/ hx chronic Afib, D-CHF, HTN, HLD, CVA, COPD w/ restrictive lung dz, pulm HTN, chronic resp failure on home O2, mitral and tricuspid regurg, mitral stenosis, carotid dz, anemia, venous stasis w/ ulcers, was admitted 01/31 w/ CHF  Assessment & Plan    1. Acute on chronic diastolic CHF Creatinine is up slightly.   Hypotensive.   Will hold lasix today      3. Difficulty swallowing Has evidence of aspiration.  Some of his symptoms may be due to chronic aspiration  4.  Failure to thrive.:   He clearly has failure to thrive.  I think a palliative care consult is indicated.    Otherwise, per IM Principal Problem:   Acute exacerbation of CHF (congestive heart failure) (HCC) Active Problems:   Atrial fibrillation, chronic (HCC)   Mitral stenosis   Acute on chronic respiratory failure (HCC)   COPD (chronic obstructive pulmonary disease) (HCC)   Pressure injury of skin   Pleural effusion, left   Pleural effusion, right   Aspiration pneumonia of right middle lobe due to regurgitated food ([place])

## 2019-08-05 NOTE — Progress Notes (Signed)
PT Cancellation Note  Patient Details Name: Aaron Frye MRN: [id number] DOB: 01-12-1925   Cancelled Treatment:    Reason Eval/Treat Not Completed: Fatigue/lethargy limiting ability to participate;Patient declined, no reason specified Attempted to see pt for PT treatment. Pt is lethargic upon arrival and daughter at bedside. Pt nodded head "no" when daughter asked about participating in therapy. Pt opening eyes briefly while in room. PT will continue to follow acutely.   Earney Navy, PTA Acute Rehabilitation Services Pager: [phone] Office: [phone]   08/05/2019, 2:55 PM

## 2019-08-05 NOTE — Progress Notes (Addendum)
   08/05/19 0800  Vitals  BP 94/75  MAP (mmHg) 83  Pulse Rate 89  ECG Heart Rate 89  Resp (!) 23  Oxygen Therapy  SpO2 100 %  O2 Device Nasal Cannula  O2 Flow Rate (L/min) 5 L/min  MEWS Score  MEWS Temp 0  MEWS Systolic 1  MEWS Pulse 0  MEWS RR 1  MEWS LOC 0  MEWS Score 2  MEWS Score Color Yellow  MEWS Assessment  Is this an acute change? No   Patient fluctuates between green and yellow MEWS given chronic Afib HR, soft BP and RR. Patient VS remain stable and within ordered range for MD notification.

## 2019-08-05 NOTE — Progress Notes (Signed)
Nutrition Follow-up  DOCUMENTATION CODES:   Not applicable  INTERVENTION:   -Continue MVI with minerals daily -D/c Ensure Enlive po BID, each supplement provides 350 kcal and 20 grams of protein -Magic cup TID with meals, each supplement provides 290 kcal and 9 grams of protein  NUTRITION DIAGNOSIS:   Increased nutrient needs related to wound healing as evidenced by estimated needs.  Ongoing  GOAL:   Patient will meet greater than or equal to 90% of their needs  Unmet  MONITOR:   PO intake, Supplement acceptance, Diet advancement, Labs, Weight trends, Skin, I & O's  REASON FOR ASSESSMENT:   Low Braden    ASSESSMENT:   Aaron Frye is a 86 y.o. male with medical history significant of persistent atrial fibrillation on Coumadin, CVA, hypertension, hyperlipidemia, severe mitral stenosis with moderate mitral regurgitation, chronic diastolic congestive heart failure, pulmonary hypertension, COPD and restrictive lung disease due to kyphosis with chronic hypoxic respiratory failure on 2 L home oxygen presenting to the ED via EMS for evaluation of shortness of breath.  Oxygen saturation 80% on room air per EMS and improved to 96% on a nonrebreather.  2/2- s/p BSE- downgrade diet to dysphagia diet with honey thick liquids; s/p lt thoracentesis (1350 ml rust pleural fluid removed)  Reviewed I/O's: +590 ml x 24 hours and +600 ml since admission  UOP: 200 ml x 24 hours  Per SLP notes, pt had been consuming soft textured foods and thickened liquids at home since prior MBSS in 04/2019.   Pt remains with poor oral intake; noted meal completion 0-25%. Pt is also refusing Ensure supplements. Pt resting quietly in bed with meal tray in front of him, which was untouched. SLP noted that pt also declined exam today due to feeling tired.   Palliative care consult has been ordered to discuss goals of care.   Labs reviewed.   Diet Order:   Diet Order            DIET DYS 2 Room  service appropriate? 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  Estimated Nutritional Needs:   Kcal:  1600-1800  Protein:  75-90 grams  Fluid:  1.2 L    Nickisha Hum A. Jimmye Norman, RD, LDN, [place] Registered Dietitian II Certified Diabetes Care and Education Specialist Pager: [phone] After hours Pager: [phone]

## 2019-08-06 DIAGNOSIS — Z515 Encounter for palliative care: Secondary | ICD-10-CM

## 2019-08-06 DIAGNOSIS — J810 Acute pulmonary edema: Secondary | ICD-10-CM

## 2019-08-06 DIAGNOSIS — Z7189 Other specified counseling: Secondary | ICD-10-CM

## 2019-08-06 LAB — BASIC METABOLIC PANEL
Anion gap: 14 (ref 5–15)
BUN: 59 mg/dL — ABNORMAL HIGH (ref 8–23)
CO2: 36 mmol/L — ABNORMAL HIGH (ref 22–32)
Calcium: 8.9 mg/dL (ref 8.9–10.3)
Chloride: 95 mmol/L — ABNORMAL LOW (ref 98–111)
Creatinine, Ser: 1.3 mg/dL — ABNORMAL HIGH (ref 0.61–1.24)
GFR calc Af Amer: 55 mL/min — ABNORMAL LOW (ref >60)
GFR calc non Af Amer: 47 mL/min — ABNORMAL LOW (ref >60)
Glucose, Bld: 106 mg/dL — ABNORMAL HIGH (ref 70–99)
Potassium: 3.8 mmol/L (ref 3.5–5.1)
Sodium: 145 mmol/L (ref 135–145)

## 2019-08-06 LAB — CBC
HCT: 32.3 % — ABNORMAL LOW (ref 39.0–52.0)
Hemoglobin: 9.4 g/dL — ABNORMAL LOW (ref 13.0–17.0)
MCH: 25.3 pg — ABNORMAL LOW (ref 26.0–34.0)
MCHC: 29.1 g/dL — ABNORMAL LOW (ref 30.0–36.0)
MCV: 87.1 fL (ref 80.0–100.0)
Platelets: 235 10*3/uL (ref 150–400)
RBC: 3.71 MIL/uL — ABNORMAL LOW (ref 4.22–5.81)
RDW: 18.2 % — ABNORMAL HIGH (ref 11.5–15.5)
WBC: 13.6 10*3/uL — ABNORMAL HIGH (ref 4.0–10.5)
nRBC: 0 % (ref 0.0–0.2)

## 2019-08-06 LAB — PROTIME-INR
INR: 2.1 — ABNORMAL HIGH (ref 0.8–1.2)
Prothrombin Time: 23.1 seconds — ABNORMAL HIGH (ref 11.4–15.2)

## 2019-08-06 LAB — MAGNESIUM: Magnesium: 2.7 mg/dL — ABNORMAL HIGH (ref 1.7–2.4)

## 2019-08-06 LAB — PH, BODY FLUID: pH, Body Fluid: 8

## 2019-08-06 MED ORDER — LORAZEPAM 2 MG/ML IJ SOLN: 1.0000 mg | INTRAMUSCULAR | Status: DC | PRN

## 2019-08-06 MED ORDER — MORPHINE SULFATE 20 MG/5ML PO SOLN: 5.0000 mg | ORAL | 0 refills | Status: AC | PRN

## 2019-08-06 MED ORDER — LORAZEPAM 1 MG PO TABS: 1.0000 mg | ORAL_TABLET | ORAL | Status: DC | PRN

## 2019-08-06 MED ORDER — MORPHINE SULFATE (CONCENTRATE) 10 MG/0.5ML PO SOLN: 5.0000 mg | ORAL | Status: DC | PRN

## 2019-08-06 MED ORDER — WARFARIN SODIUM 2.5 MG PO TABS: 2.5000 mg | ORAL_TABLET | Freq: Once | ORAL | Status: DC

## 2019-08-06 MED ORDER — LORAZEPAM 1 MG PO TABS: 1.0000 mg | ORAL_TABLET | ORAL | 0 refills | Status: AC | PRN

## 2019-08-06 MED ORDER — NITROFURANTOIN MONOHYD MACRO 100 MG PO CAPS: 100.0000 mg | ORAL_CAPSULE | Freq: Two times a day (BID) | ORAL | 0 refills | Status: AC

## 2019-08-06 MED ORDER — LORAZEPAM 2 MG/ML PO CONC: 1.0000 mg | ORAL | Status: DC | PRN

## 2019-08-06 NOTE — Discharge Summary (Signed)
Physician Discharge Summary  Aaron Frye [id number] DOB: [birth year] DOA: 08/02/2019  PCP: Nickola Major, MD  Admit date: 08/02/2019 Discharge date: 08/06/2019  Admitted From: Home  Discharge disposition: Home with hospice  Recommendations for Outpatient Follow-Up:   . Follow up with your hospice provider at home. . Medications to be adjusted by hospice provider.  Discharge Diagnosis:   Principal Problem:   Acute exacerbation of CHF (congestive heart failure) (HCC) Active Problems:   Atrial fibrillation, chronic (HCC)   Mitral stenosis   Acute on chronic respiratory failure (HCC)   COPD (chronic obstructive pulmonary disease) (HCC)   Pressure injury of skin   Pleural effusion, left   Pleural effusion, right   Aspiration pneumonia of right middle lobe due to regurgitated food ([place])   Acute pulmonary edema (HCC)   Advanced care planning/counseling discussion   Goals of care, counseling/discussion   Palliative care by specialist   Discharge Condition: stable  Diet recommendation: low sodium/dysphagia II  Code status: DNR   History of Present Illness:   Aaron Rumley Scruggsis a 86 y.o.malewith medical history significant ofpersistent atrial fibrillation on Coumadin, CVA, hypertension, hyperlipidemia, severe mitral stenosis with moderate mitral regurgitation, chronic diastolic congestive heart failure, pulmonary hypertension, COPD and restrictive lung disease due to kyphosis with chronic hypoxic respiratory failure on 2 L home oxygen presented to the ED via EMS for evaluation of shortness of breath. Oxygen saturation 80% on room air, per EMS and improved to 96% on a nonrebreather. ED Course:Afebrile. Tachypneic. WBC count 20.8, chronically elevated. Potassium 3.3. Initial high-sensitivity troponin negative, repeat pending. BNP 357. Blood culture x2 pending. SARS-CoV-2 PCR test negative. Influenza panel negative. Chest x-ray showed cardiomegaly with pleural  effusions and pulmonary edema. Patient received aspirin 324 mg, IV Lasix 40 mg, and potassium supplementation.   Hospital Course:   Following conditions were addressed during hospitalization as listed below,  Acute on chronic diastolic congestive heart failure. History of severe mitral stenosis with moderate mitral regurgitation, pulmonary hypertension.Chest x-ray with pleural effusion and pulmonary edema.  2D echocardiogram done on 08/02/2019 showed LV strain fraction of 55 to 60%.    Patient was seen by pulmonary and cardiology.  Underwent thoracocentesis.  Bilateral pleural effusion with hypoxia.. Status post bilateral thoracocentesis by pulmonary on 08/04/2019 with removal of 1250 mL rusty fluid from the right lung and 1350 mL of rusty fluid from the left lung.  Pleural fluid showed features of transudative effusion.  Post tapping there was note of 10% pneumothorax in the right lung which is likely to be basilar pneumothorax after thoracocentesis.  Patient was not in acute distress.    Patient was however continued on supplemental oxygen  COPD / restrictive lung disease, on 2 L of oxygen at baseline.Currently on 3 L of oxygen by nasal cannula.  Initially required nonrebreather in the ED.   Left lateral foot ulceration.  Neuropathic ulcer.  Present on admission. Seen by wound care. On boot at this time.  X-ray of the foot did not show any evidence of osteomyelitis.  Persistent atrial fibrillation on Coumadin.  Pharmacy to dose Coumadin.   On metoprolol succinate at home and patient does have borderline hypotension limiting its use.  History of CVA.on  Coumadin.  No focal deficits.  EssentialHypertension.on lasix and metoprolol at home.  Leukocytosis on presentation.  Possibility of chronic aspiration. Seen by speech therapy and is considered to be moderate aspiration risk.  Dysphagia 2 diet has been advised.  ProCalcitonin at 0.8.  Influenza was  negative.  Covid test was negative.     E. coli UTI.    Received IV Rocephin.  Will continue nitrofurantoin on discharge  Hypokalemia. Mild.  Improved after replacement.    Ambulatory dysfunction. Walks with walker at baseline. Lives with his wife at home.  Physical therapy saw the patient and recommend home health on discharge.  At this time patient is extremely deconditioned and feeble and will be going home with home hospice  Extreme debility, deconditioning, advanced heart failure, failure to thrive, advanced age, decreased cognition.  Palliative care was consulted to address these issues and at this time the family has decided to proceed with home hospice on discharge.  Stage II sacral pressure ulceration   Present on admission.    Disposition.  At this time, patient has been seen by palliative care.  He has been considered stable for disposition home with home hospice.  Spoke with the patient's daughter [name] on the phone regarding the plan for disposition.  Medical Consultants:    Cardiology  Pulmonary  Palliative care  Procedures:    Bilateral thoracocentesis Subjective:   Today, patient is little more alert awake but somnolent at times.  Denies overt chest pain.  Discharge Exam:   Vitals:   08/06/19 0800 08/06/19 0801  BP: 111/82 111/82  Pulse: 99 (!) 105  Resp: 13 16  Temp:  98.1 F (36.7 C)  SpO2: 100% 100%   Vitals:   08/06/19 0350 08/06/19 0700 08/06/19 0800 08/06/19 0801  BP:  (!) 97/53 111/82 111/82  Pulse:  86 99 (!) 105  Resp:  12 13 16   Temp:    98.1 F (36.7 C)  TempSrc:    Oral  SpO2:  100% 100% 100%  Weight: 54.3 kg     Height:       General: Alert awake, hard of hearing.  Not in obvious distress.  Nasal cannula 3 L/min.  Thinly built. HENT: pupils equally reacting to light,  No scleral pallor or icterus noted. Oral mucosa is moist.  Chest: Diminished breath sounds bilaterally.  Kyphotic chest. CVS: S1 &S2 heard. No murmur. irregular rhythm. Abdomen: Soft, nontender,  nondistended.  Bowel sounds are heard.   Extremities: Left leg with boot.  Edema bilateral lower extremities.  Bilateral venous stasis. Psych: Alert, awake but hard of hearing. CNS:  No cranial nerve deficits.  Power equal in all extremities but generalized weakness noted.   Skin: Left lower extremity ulceration.  Sacral stage II ulceration.  The results of significant diagnostics from this hospitalization (including imaging, microbiology, ancillary and laboratory) are listed below for reference.     Diagnostic Studies:   DG Chest Portable 1 View  Result Date: 08/02/2019 CLINICAL DATA:  Shortness of breath. Hypoxia. EXAM: PORTABLE CHEST 1 VIEW COMPARISON:  Radiograph 04/13/2019, 10/20/2017. FINDINGS: Chronic cardiomegaly, possibly increased from prior. Aortic atherosclerosis. Moderate right and small left pleural effusion, slightly increased from prior exam. Adjacent bibasilar airspace disease favors atelectasis. Reticular opacities with increased septal thickening at the bases suspicious for pulmonary edema. No pneumothorax. Bones are diffusely under mineralized. IMPRESSION: 1. CHF pattern. Cardiomegaly with pleural effusions and pulmonary edema. Findings have progressed from 04/13/2019 radiograph. 2.  Aortic Atherosclerosis (ICD10-I70.0). Electronically Signed   By: Keith Rake M.D.   On: 08/02/2019 02:10   DG Foot 2 Views Left  Result Date: 08/02/2019 CLINICAL DATA:  Lateral foot ulcer at the metatarsal area. EXAM: LEFT FOOT - 2 VIEW COMPARISON:  None. FINDINGS: Regional arterial calcification. No plain radiographic  evidence of osteomyelitis or pathologic fracture. Mild regional osteopenia. IMPRESSION: No plain radiographic sign of osteomyelitis.  No fracture. Electronically Signed   By: Nelson Chimes M.D.   On: 08/02/2019 05:57   ECHOCARDIOGRAM COMPLETE  Result Date: 08/02/2019   ECHOCARDIOGRAM REPORT   Patient Name:   Aaron Frye Date of Exam: 08/02/2019 Medical Rec #:  [id number]         Height:       67.0 in Accession #:    [id number]       Weight:       141.5 lb Date of Birth:  [birth year]        BSA:          1.75 m Patient Age:    59 years         BP:           109/64 mmHg Patient Gender: M                HR:           86 bpm. Exam Location:  Inpatient Procedure: 2D Echo, 3D Echo, Color Doppler and Cardiac Doppler Indications:    [id number] Acute diastolic (congestive) heart failure, Mitral Valve                 Stenosis 105.0  History:        Patient has prior history of Echocardiogram examinations, most                 recent 07/18/2017. CHF, COPD, Arrythmias:Atrial Fibrillation;                 Risk Factors:Hypertension and Dyslipidemia.  Sonographer:    Raquel Sarna Senior RDCS Referring Phys: [id number] [place]  1. Left ventricular ejection fraction, by visual estimation, is 55 to 60%. The left ventricle has normal function. There is no left ventricular hypertrophy.  2. The left ventricle has no regional wall motion abnormalities.  3. Global right ventricle was not well visualized.The right ventricular size is mildly enlarged. Right vetricular wall thickness was not assessed.  4. RV is difficult to see well Appears mildly dilated. Free wall is difficult to see to evaluate function adequately.  5. Left atrial size was severely dilated.  6. Right atrial size was severely dilated.  7. Large pleural effusion.  8. Moderate thickening of the mitral valve leaflet(s).  9. The mitral valve is abnormal. Mild to moderate mitral valve regurgitation. Mild mitral stenosis. 10. Mitral valve is thickened with restricted motion. Peak and mean gradients through the valve are 29 and 10 mm Hg respectively MVA by P T1/2 is approximately 1.5 cm 2 consistent with moderate MS. COmpared to report from January 2019 no significant change in mean gradient. 11. The tricuspid valve is normal in structure. 12. The tricuspid valve is normal in structure. Tricuspid valve regurgitation is moderate. 13. The aortic  valve is abnormal. Aortic valve regurgitation is not visualized. 14. AV is thickened with restricted motion Peak and mean gradients are 15 and 7 mm Hg respectively LVOT/AV VTI ratio is 0.41 consistent with mild to moderate AS. 15. The pulmonic valve was grossly normal. Pulmonic valve regurgitation is not visualized. 16. Moderately elevated pulmonary artery systolic pressure. 17. The inferior vena cava is normal in size with greater than 50% respiratory variability, suggesting right atrial pressure of 3 mmHg. 18. The interatrial septum was not assessed. FINDINGS  Left Ventricle: Left ventricular ejection fraction, by visual estimation, is 55 to 60%.  The left ventricle has normal function. The left ventricle has no regional wall motion abnormalities. The left ventricular internal cavity size was the left ventricle is normal in size. There is no left ventricular hypertrophy. Right Ventricle: The right ventricular size is mildly enlarged. Right vetricular wall thickness was not assessed. Global RV systolic function is was not well visualized. The tricuspid regurgitant velocity is 3.85 m/s, and with an assumed right atrial pressure of 8 mmHg, the estimated right ventricular systolic pressure is moderately elevated at 67.3 mmHg. RV is difficult to see well Appears mildly dilated. Free wall is difficult to see to evaluate function adequately. Left Atrium: Left atrial size was severely dilated. Right Atrium: Right atrial size was severely dilated Pericardium: There is no evidence of pericardial effusion. There is a large pleural effusion. Mitral Valve: The mitral valve is abnormal. There is moderate thickening of the mitral valve leaflet(s). Mildly decreased mobility of the mitral valve leaflets. Mild to moderate mitral valve regurgitation. Mild mitral valve stenosis by observation. MV peak gradient, 28.0 mmHg. Mitral valve is thickened with restricted motion. Peak and mean gradients through the valve are 29 and 10 mm Hg  respectively MVA by P T1/2 is approximately 1.5 cm 2 consistent with moderate MS. COmpared to report from January 2019 no significant change in mean gradient. Tricuspid Valve: The tricuspid valve is normal in structure. Tricuspid valve regurgitation is moderate. Aortic Valve: The aortic valve is abnormal. Aortic valve regurgitation is not visualized. Aortic valve mean gradient measures 7.0 mmHg. AV is thickened with restricted motion Peak and mean gradients are 15 and 7 mm Hg respectively LVOT/AV VTI ratio is 0.41 consistent with mild to moderate AS. Pulmonic Valve: The pulmonic valve was grossly normal. Pulmonic valve regurgitation is not visualized. Pulmonic regurgitation is not visualized. Aorta: The aortic root is normal in size and structure. Venous: The inferior vena cava is normal in size with greater than 50% respiratory variability, suggesting right atrial pressure of 3 mmHg. IAS/Shunts: The interatrial septum was not assessed.  LEFT VENTRICLE PLAX 2D LVIDd:         4.60 cm LVIDs:         3.20 cm LV PW:         0.70 cm LV IVS:        0.80 cm LVOT diam:     1.70 cm LV SV:         56 ml LV SV Index:   32.33 LVOT Area:     2.27 cm  RIGHT VENTRICLE RV S prime:     7.21 cm/s TAPSE (M-mode): 1.4 cm LEFT ATRIUM            Index       RIGHT ATRIUM           Index LA diam:      5.10 cm  2.92 cm/m  RA Area:     23.00 cm LA Vol (A2C): 81.0 ml  46.39 ml/m RA Volume:   70.40 ml  40.32 ml/m LA Vol (A4C): 108.0 ml 61.86 ml/m  AORTIC VALVE AV Mean Grad: 7.0 mmHg LVOT Vmax:    78.30 cm/s LVOT Vmean:   50.500 cm/s LVOT VTI:     0.159 m  AORTA Ao Root diam: 3.50 cm Ao Asc diam:  3.60 cm MITRAL VALVE               TRICUSPID VALVE MV Area (PHT): 1.69 cm    TR Peak grad:   59.3 mmHg MV  Peak grad:  28.0 mmHg   TR Vmax:        385.00 cm/s MV Mean grad:  10.0 mmHg MV Vmax:       2.64 m/s    SHUNTS MV Vmean:      142.0 cm/s  Systemic VTI:  0.16 m MV VTI:        0.77 m      Systemic Diam: 1.70 cm MV PHT:        130.00 msec   Dorris Carnes MD Electronically signed by Dorris Carnes MD Signature Date/Time: 08/02/2019/5:09:34 PM    Final      Labs:   Basic Metabolic Panel: Recent Labs  Lab 08/02/19 0135 08/02/19 0135 08/02/19 0429 08/03/19 [id number] 08/03/19 [id number] 08/04/19 0345 08/04/19 0345 08/05/19 0524 08/06/19 0329  NA 139  --   --  141  --  142  --  144 145  K 3.3*   < >  --  4.1   < > 4.4   < > 4.4 3.8  CL 93*  --   --  94*  --  94*  --  93* 95*  CO2 33*  --   --  33*  --  37*  --  36* 36*  GLUCOSE 145*  --   --  107*  --  117*  --  86 106*  BUN 23  --   --  30*  --  37*  --  50* 59*  CREATININE 1.02  --   --  1.08  --  1.29*  --  1.25* 1.30*  CALCIUM 8.6*  --   --  8.6*  --  9.0  --  8.9 8.9  MG  --   --  2.0 2.2  --   --   --  2.6* 2.7*  PHOS  --   --   --  3.9  --   --   --  4.6  --    < > = values in this interval not displayed.   GFR Estimated Creatinine Clearance: 27.8 mL/min (A) (by C-G formula based on SCr of 1.3 mg/dL (H)). Liver Function Tests: Recent Labs  Lab 08/04/19 0345 08/04/19 1420 08/05/19 0524  AST 18  --  18  ALT 16  --  13  ALKPHOS 81  --  65  BILITOT 1.4*  --  1.3*  PROT 6.8 6.8 6.3*  ALBUMIN 2.2*  --  2.2*   No results for input(s): LIPASE, AMYLASE in the last 168 hours. No results for input(s): AMMONIA in the last 168 hours. Coagulation profile Recent Labs  Lab 08/02/19 0354 08/03/19 0212 08/04/19 0345 08/05/19 0524 08/06/19 0329  INR 3.1* 4.0* 2.5* 2.2* 2.1*    CBC: Recent Labs  Lab 08/02/19 0135 08/03/19 0212 08/04/19 0345 08/05/19 0524 08/06/19 0329  WBC 20.8* 18.3* 17.5* 12.4* 13.6*  NEUTROABS 18.0*  --   --   --   --   HGB 11.0* 8.8* 9.5* 9.5* 9.4*  HCT 36.9* 29.0* 31.1* 32.1* 32.3*  MCV 85.8 84.1 84.5 85.8 87.1  PLT 232 186 221 227 235   Cardiac Enzymes: No results for input(s): CKTOTAL, CKMB, CKMBINDEX, TROPONINI in the last 168 hours. BNP: Invalid input(s): POCBNP CBG: No results for input(s): GLUCAP in the last 168 hours. D-Dimer No  results for input(s): DDIMER in the last 72 hours. Hgb A1c No results for input(s): HGBA1C in the last 72 hours. Lipid Profile Recent Labs    08/04/19 1420  CHOL 100   Thyroid function studies Recent Labs    08/04/19 0345  TSH 0.781   Anemia work up No results for input(s): VITAMINB12, FOLATE, FERRITIN, TIBC, IRON, RETICCTPCT in the last 72 hours. Microbiology Recent Results (from the past 240 hour(s))  Blood culture (routine x 2)     Status: None (Preliminary result)   Collection Time: 08/02/19  1:35 AM   Specimen: BLOOD  Result Value Ref Range Status   Specimen Description BLOOD RIGHT ARM  Final   Special Requests   Final    BOTTLES DRAWN AEROBIC AND ANAEROBIC Blood Culture results may not be optimal due to an inadequate volume of blood received in culture bottles   Culture   Final    NO GROWTH 4 DAYS Performed at [facility], [place] [address] [place], [place] [address]    Report Status PENDING  Incomplete  Respiratory Panel by RT PCR (Flu A&B, Covid) - Nasopharyngeal Swab     Status: None   Collection Time: 08/02/19  1:43 AM   Specimen: Nasopharyngeal Swab  Result Value Ref Range Status   SARS Coronavirus 2 by RT PCR NEGATIVE NEGATIVE Final    Comment: (NOTE) SARS-CoV-2 target nucleic acids are NOT DETECTED. The SARS-CoV-2 RNA is generally detectable in upper respiratoy specimens during the acute phase of infection. The lowest concentration of SARS-CoV-2 viral copies this assay can detect is 131 copies/mL. A negative result does not preclude SARS-Cov-2 infection and should not be used as the sole basis for treatment or other patient management decisions. A negative result may occur with  improper specimen collection/handling, submission of specimen other than nasopharyngeal swab, presence of viral mutation(s) within the areas targeted by this assay, and inadequate number of viral copies (<131 copies/mL). A negative result must be combined with  clinical observations, patient history, and epidemiological information. The expected result is Negative. Fact Sheet for Patients:  PinkCheek.be Fact Sheet for Healthcare Providers:  GravelBags.it This test is not yet ap proved or cleared by the [place] FDA and  has been authorized for detection and/or diagnosis of SARS-CoV-2 by FDA under an Emergency Use Authorization (EUA). This EUA will remain  in effect (meaning this test can be used) for the duration of the COVID-19 declaration under Section 564(b)(1) of the Act, 21 U.S.C. section 360bbb-3(b)(1), unless the authorization is terminated or revoked sooner.    Influenza A by PCR NEGATIVE NEGATIVE Final   Influenza B by PCR NEGATIVE NEGATIVE Final    Comment: (NOTE) The Xpert Xpress SARS-CoV-2/FLU/RSV assay is intended as an aid in  the diagnosis of influenza from Nasopharyngeal swab specimens and  should not be used as a sole basis for treatment. Nasal washings and  aspirates are unacceptable for Xpert Xpress SARS-CoV-2/FLU/RSV  testing. Fact Sheet for Patients: PinkCheek.be Fact Sheet for Healthcare Providers: GravelBags.it This test is not yet approved or cleared by the [place] FDA and  has been authorized for detection and/or diagnosis of SARS-CoV-2 by  FDA under an Emergency Use Authorization (EUA). This EUA will remain  in effect (meaning this test can be used) for the duration of the  Covid-19 declaration under Section 564(b)(1) of the Act, 21  U.S.C. section 360bbb-3(b)(1), unless the authorization is  terminated or revoked. Performed at [facility], [place] [address], [place],  [address]   Blood culture (routine x 2)     Status: None (Preliminary result)   Collection Time: 08/02/19  3:19 AM   Specimen: BLOOD  Result Value Ref Range Status   Specimen Description BLOOD LEFT ARM  Final    Special Requests   Final    BOTTLES DRAWN AEROBIC AND ANAEROBIC Blood Culture adequate volume   Culture   Final    NO GROWTH 4 DAYS Performed at [facility], [address], [place], [place] [address]    Report Status PENDING  Incomplete  Culture, Urine     Status: Abnormal   Collection Time: 08/03/19  5:17 PM   Specimen: Urine, Random  Result Value Ref Range Status   Specimen Description URINE, RANDOM  Final   Special Requests   Final    NONE Performed at [facility], [place] [address] [place], [place] [address]    Culture >=100,000 COLONIES/mL ESCHERICHIA COLI (A)  Final   Report Status 08/05/2019 FINAL  Final   Organism ID, Bacteria ESCHERICHIA COLI (A)  Final      Susceptibility   Escherichia coli - MIC*    AMPICILLIN >=32 RESISTANT Resistant     CEFAZOLIN <=4 SENSITIVE Sensitive     CEFTRIAXONE <=0.25 SENSITIVE Sensitive     CIPROFLOXACIN >=4 RESISTANT Resistant     GENTAMICIN <=1 SENSITIVE Sensitive     IMIPENEM <=0.25 SENSITIVE Sensitive     NITROFURANTOIN <=16 SENSITIVE Sensitive     TRIMETH/SULFA >=320 RESISTANT Resistant     AMPICILLIN/SULBACTAM 16 INTERMEDIATE Intermediate     PIP/TAZO <=4 SENSITIVE Sensitive     * >=100,000 COLONIES/mL ESCHERICHIA COLI  Body fluid culture     Status: None (Preliminary result)   Collection Time: 08/04/19 12:49 PM   Specimen: Pleura; Body Fluid  Result Value Ref Range Status   Specimen Description PLEURAL LEFT  Final   Special Requests NONE  Final   Gram Stain   Final    ABUNDANT WBC PRESENT, PREDOMINANTLY PMN NO ORGANISMS SEEN    Culture   Final    NO GROWTH < 24 HOURS Performed at [facility], [address] [place], [place] [address]    Report Status PENDING  Incomplete  Body fluid culture     Status: None (Preliminary result)   Collection Time: 08/04/19 12:50 PM   Specimen: Pleura; Body Fluid  Result Value Ref Range Status   Specimen Description PLEURAL RIGHT  Final   Special Requests NONE   Final   Gram Stain   Final    ABUNDANT WBC PRESENT,BOTH PMN AND MONONUCLEAR NO ORGANISMS SEEN    Culture   Final    NO GROWTH 2 DAYS Performed at [facility], [address], [place], [place] [address]    Report Status PENDING  Incomplete     Discharge Instructions:   Discharge Instructions    Diet - low sodium heart healthy   Complete by: As directed    Discharge instructions   Complete by: As directed    Follow-up with hospice provider on discharge.   Increase activity slowly   Complete by: As directed      Allergies as of 08/06/2019      Reactions   Penicillins Rash, Other (See Comments)   Has patient had a PCN reaction causing immediate rash, facial/tongue/throat swelling, SOB or lightheadedness with hypotension: Yes Has patient had a PCN reaction causing severe rash involving mucus membranes or skin necrosis: Unk Has patient had a PCN reaction that required hospitalization: No Has patient had a PCN reaction occurring within the last 10 years: No If all of the above answers  are "NO", then may proceed with Cephalosporin use.      Medication List    TAKE these medications   acetaminophen 325 MG tablet Commonly known as: TYLENOL Take 325 mg by mouth 2 (two) times daily as needed (for headaches).   Adult One Daily Gummies Chew Chew 1 tablet by mouth daily.   atorvastatin 40 MG tablet Commonly known as: LIPITOR Take 1 tablet by mouth once daily What changed: when to take this   furosemide 20 MG tablet Commonly known as: LASIX TAKE 3 TABLETS BY MOUTH TWICE A DAY What changed:   how much to take  when to take this   LORazepam 1 MG tablet Commonly known as: Ativan Take 1 tablet (1 mg total) by mouth every 4 (four) hours as needed for up to 5 days for anxiety.   metoprolol succinate 25 MG 24 hr tablet Commonly known as: TOPROL-XL Take 3 tablets by mouth once daily   morphine 20 MG/5ML solution Take 1.3 mLs (5.2 mg total) by mouth every 2 (two) hours  as needed for up to 5 days for pain (air hunger).   nitrofurantoin (macrocrystal-monohydrate) 100 MG capsule Commonly known as: Macrobid Take 1 capsule (100 mg total) by mouth 2 (two) times daily for 5 days.   OXYGEN Inhale 2 L into the lungs continuous.   warfarin 5 MG tablet Commonly known as: COUMADIN Take 1 tablet daily or as directed by Coumadin clinic What changed:   how much to take  how to take this  when to take this  additional instructions      Follow-up Information    [facility] Follow up.   Specialty: Home Health Services Why: Resume [facility] services  Contact information: [address] [place] [address] [phone]        Nickola Major, MD. Go on 08/13/2019.   Specialty: Family Medicine Why: @12 :00 Contact information: [address] [place] [address] [phone]            Time coordinating discharge: 39 minutes  Signed:  Bailey Faiella  [facility] 08/06/2019, 12:38 PM

## 2019-08-06 NOTE — Consult Note (Signed)
Consultation Note Date: 08/06/2019   Patient Name: Aaron Frye  DOB: 27-May-1925  MRN: [id number]  Age / Sex: 86 y.o., male  PCP: Nickola Major, MD Referring Physician: Flora Lipps, MD  Reason for Consultation: Establishing goals of care  HPI/Patient Profile: 86 y.o. male  with past medical history of COPD, CHF, chronic respiratory failure, hip fracture last September, dysphagia, a fib, pulmonary hypertension, severe mitral stenosis, restrictive lung disease due to kyphosis- not a candidate for surgery  admitted on 08/02/2019 with worsening shortness of breathe, increasing O2 needs, workup revealed pleural effusions and pulmonary edema. Treated with diuresis- however, difficulty due to low BP. Had thoracentesis on 2/2 yielding 1479mL from R lung and 1355mL from L lung. Additional findings of trap lung syndrome. UA+ for large leucocytes and few bacteria being treated with antibiotics. Poor oral intake- SLP consult with recommendations for dysphagia diet- pt is likely chronically aspirating. Poor prognosis with weakening heart and lung function, as well as overall failure to thrive. Palliative medicine consulted for goals of care.   Clinical Assessment and Goals of Care:  I have reviewed medical records including EPIC notes, labs and imaging, spoke at bedside with patient's daughter and HCPOA- [name]-  to discuss diagnosis prognosis, [place], EOL wishes, disposition and options.  I introduced Palliative Medicine as specialized medical care for people living with serious illness. It focuses on providing relief from the symptoms and stress of a serious illness.   We discussed a brief life review of the patient. 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 Known as someone who wants to make certain everyone is cared for. Spiritual man, member of [employer]  As far as functional and nutritional status - [name] has noticed overall decline- especially since his hip surgery in September. Prior to that he was driving, complete all ADL's on his own and caring for his spouse who has dementia. Since that hospitalization however- patient's ambulation has been limited- he requires assistance with ADL's and he has not had an appetite. [name] notes he has been on a downhill decline and she feels he is at the bottom. He and wife have 24 hour caregivers in the home.    We discussed her current illness and what it means in the larger context of her on-going co-morbidities.  Natural disease trajectory and expectations at EOL were discussed. [name]'s hopes are for patient to be at home with his wife and kept comfortable until he dies. She does not wish to see him distressed and anxious as he was prior to this admission. We discussed symptom management using morphine for shortness of breath. She notes it is important for his wife to help with his EOL care. He would want to die peacefully at home surrounded by his family with time given for him to say his goodbyes.  The difference between aggressive medical intervention and comfort care was considered in light of the patient's goals of care.   Advanced  directives, concepts specific to code status, rehospitalization were considered and discussed. [name] agrees that transitioning to more comfort focus care, avoiding return to hospital unless needed for symptom management, and Do Not Resuscitate order would best align with patient's goals for end of life.   Hospice and Palliative Care services outpatient were explained and offered. [name] would like patient to receive assistance from Hospice at discharge- he is currently enrolled with [facility] and would like to utilize their Hospice services.   Questions and concerns were addressed.  The family was encouraged to call with questions or concerns.   Primary Decision  Maker HCPOA- daughter- Susam    SUMMARY OF RECOMMENDATIONS -Patient at end of life due to CHF with recurrent pleural effusions as well as failure to thrive -Would patient benefit from placement of Pleurex cath for symptom management?  -Plan to discharge home with Hospice -Morphine concentrated liquid 5mg  q2hr prn for dyspnea and anxiety -Lorazepam 1mg  q4hrs for anxiety     Code Status/Advance Care Planning:  DNR  Additional Recommendations (Limitations, Scope, Preferences):  Avoid Hospitalization, Minimize Medications and No Diagnostics    Prognosis:    < 6 months  Discharge Planning: Home with Hospice  Primary Diagnoses: Present on Admission: . COPD (chronic obstructive pulmonary disease) ([place]) . Atrial fibrillation, chronic ([place]) . Mitral stenosis   I have reviewed the medical record, interviewed the patient and family, and examined the patient. The following aspects are pertinent.  Past Medical History:  Diagnosis Date  . Anemia   . Atrial fibrillation ([place])    Persisent   . Carotid artery disease ([place])    a. Carotid doppler 2009 - bilateral plaque in the bifurcation on the right, 40% to 60% ICA stenosis; left, no ICA stenosis. The right vertebral artery was not visualized and left vertebral artery flow was antegrade  . Chronic atrial fibrillation ([place])   . Chronic diastolic CHF (congestive heart failure) ([place])   . Chronic respiratory failure ([place])   . COPD (chronic obstructive pulmonary disease) ([place])   . CVA (cerebral vascular accident) ([place])   . Femur fracture, right [facility]) March 2016  . HLD (hyperlipidemia)   . HTN (hypertension)   . Mitral regurgitation   . Mitral stenosis   . Non-ischemic cardiomyopathy ([place])    LVEF=35-40% by echo 2009  . Prostate cancer ([place])   . Pulmonary hypertension ([place])   . TIA (transient ischemic attack)   . Venous stasis   . Venous stasis ulcer ([place])    Social History   Socioeconomic History  . Marital status: Married     Spouse name: Not on file  . Number of children: Not on file  . Years of education: Not on file  . Highest education level: Not on file  Occupational History  . 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 Smoking status: Never Smoker  . Smokeless tobacco: Never Used  Substance and Sexual Activity  . Alcohol use: No  . Drug use: No  . Sexual activity: Not on file  Other Topics Concern  . Not on file  Social History Narrative  . Not on file   Social Determinants of Health   Financial Resource Strain:   . Difficulty of Paying Living Expenses: Not on file  Food Insecurity:   . Worried About Charity fundraiser in the Last Year: Not on file  . Ran Out of Food in the Last Year: Not on file  Transportation Needs:   . Lack of Transportation (  Medical): Not on file  . Lack of Transportation (Non-Medical): Not on file  Physical Activity:   . Days of Exercise per Week: Not on file  . Minutes of Exercise per Session: Not on file  Stress:   . Feeling of Stress : Not on file  Social Connections:   . Frequency of Communication with Friends and Family: Not on file  . Frequency of Social Gatherings with Friends and Family: Not on file  . Attends Religious Services: Not on file  . Active Member of Clubs or Organizations: Not on file  . Attends Archivist Meetings: Not on file  . Marital Status: Not on file   Family History  Problem Relation Age of Onset  . Lung disease Father   . Heart failure Mother   . Cancer Brother        Not sure which type of cancer  . Heart attack Brother   . Hypertension Son   . Stroke Neg Hx    Scheduled Meds: . atorvastatin  40 mg Oral q1800  . collagenase   Topical Daily  . metoprolol succinate  75 mg Oral Daily  . multivitamin with minerals  1 tablet Oral Daily  . sodium chloride flush  3 mL Intravenous Q12H  . warfarin  2.5 mg Oral ONCE-1800  . Warfarin - Pharmacist Dosing Inpatient   Does not apply q1800   Continuous Infusions: .  sodium chloride Stopped (08/05/19 1630)  . cefTRIAXone (ROCEPHIN)  IV 1 g (08/05/19 1509)   PRN Meds:.sodium chloride, acetaminophen, ipratropium-albuterol, LORazepam **OR** LORazepam **OR** LORazepam, morphine CONCENTRATE **OR** morphine CONCENTRATE, Resource ThickenUp Clear, sodium chloride flush Medications Prior to Admission:  Prior to Admission medications   Medication Sig Start Date End Date Taking? Authorizing Provider  acetaminophen (TYLENOL) 325 MG tablet Take 325 mg by mouth 2 (two) times daily as needed (for headaches).   Yes [provider]  atorvastatin (LIPITOR) 40 MG tablet Take 1 tablet by mouth once daily Patient taking differently: Take 40 mg by mouth daily at 6 PM.  01/29/19  Yes Josue Hector, MD  furosemide (LASIX) 20 MG tablet TAKE 3 TABLETS BY MOUTH TWICE A DAY Patient taking differently: Take 20 mg by mouth 2 (two) times daily.  06/17/19  Yes Josue Hector, MD  metoprolol succinate (TOPROL-XL) 25 MG 24 hr tablet Take 3 tablets by mouth once daily Patient taking differently: Take 75 mg by mouth daily.  02/05/19  Yes Josue Hector, MD  Multiple Vitamins-Minerals (ADULT ONE DAILY GUMMIES) CHEW Chew 1 tablet by mouth daily.   Yes [provider]  warfarin (COUMADIN) 5 MG tablet Take 1 tablet daily or as directed by Coumadin clinic Patient taking differently: Take 2.5 mg by mouth daily at 6 PM.  02/05/19  Yes Josue Hector, MD  OXYGEN Inhale 2 L into the lungs continuous.    [provider]   Allergies  Allergen Reactions  . Penicillins Rash and Other (See Comments)    Has patient had a PCN reaction causing immediate rash, facial/tongue/throat swelling, SOB or lightheadedness with hypotension: Yes Has patient had a PCN reaction causing severe rash involving mucus membranes or skin necrosis: Unk Has patient had a PCN reaction that required hospitalization: No Has patient had a PCN reaction occurring within the last 10 years: No If all of the  above answers are "NO", then may proceed with Cephalosporin use.    Review of Systems  Physical Exam Vitals and nursing note reviewed.  Constitutional:      Comments: Frail, ill appearing  Neurological:     Comments: Hoh, nods to questions but does not verbalize, lethargic     Vital Signs: BP 111/82 (BP Location: Right Arm)   Pulse (!) 105   Temp 98.1 F (36.7 C) (Oral)   Resp 16   Ht 5\' 6"  (1.676 m)   Wt 54.3 kg   SpO2 100%   BMI 19.32 kg/m  Pain Scale: 0-10   Pain Score: 0-No pain   SpO2: SpO2: 100 % O2 Device:SpO2: 100 % O2 Flow Rate: .O2 Flow Rate (L/min): 3 L/min  IO: Intake/output summary:   Intake/Output Summary (Last 24 hours) at 08/06/2019 1142 Last data filed at 08/06/2019 0900 Gross per 24 hour  Intake 705 ml  Output 451 ml  Net 254 ml    LBM: Last BM Date: 08/04/19 Baseline Weight: Weight: 59.2 kg Most recent weight: Weight: 54.3 kg     Palliative Assessment/Data: PPS: 20%     Thank you for this consult. Palliative medicine will continue to follow and assist as needed.   Time In: 1030 Time Out: 1200 Time Total: 90 minutes Greater than 50%  of this time was spent counseling and coordinating care related to the above assessment and plan.  Signed by: Mariana Kaufman, AGNP-C Palliative Medicine    Please contact Palliative Medicine Team phone at [phone] for questions and concerns.  For individual provider: See Shea Evans

## 2019-08-06 NOTE — Progress Notes (Signed)
PT Cancellation Note  Patient Details Name: Aaron Frye MRN: [id number] DOB: [birth year]   Cancelled Treatment:    Reason Eval/Treat Not Completed: Patient declined, no reason specified Patient declined participating at this time due to fatigue. Noted plan is for home with hospice care. Daughter reports they have needed DME.    Earney Navy, PTA Acute Rehabilitation Services Pager: [phone] Office: [phone]   08/06/2019, 1:02 PM

## 2019-08-06 NOTE — Progress Notes (Signed)
[place] for Warfarin  Indication: atrial fibrillation  Allergies  Allergen Reactions  . Penicillins Rash and Other (See Comments)    Has patient had a PCN reaction causing immediate rash, facial/tongue/throat swelling, SOB or lightheadedness with hypotension: Yes Has patient had a PCN reaction causing severe rash involving mucus membranes or skin necrosis: Unk Has patient had a PCN reaction that required hospitalization: No Has patient had a PCN reaction occurring within the last 10 years: No If all of the above answers are "NO", then may proceed with Cephalosporin use.     Vital Signs: Temp: 98.1 F (36.7 C) (02/04 0801) Temp Source: Oral (02/04 0801) BP: 111/82 (02/04 0801) Pulse Rate: 105 (02/04 0801)  Labs: Recent Labs    08/04/19 0345 08/04/19 0345 08/05/19 0524 08/06/19 0329  HGB 9.5*   < > 9.5* 9.4*  HCT 31.1*  --  32.1* 32.3*  PLT 221  --  227 235  LABPROT 27.1*  --  23.9* 23.1*  INR 2.5*  --  2.2* 2.1*  CREATININE 1.29*  --  1.25* 1.30*   < > = values in this interval not displayed.    Estimated Creatinine Clearance: 27.8 mL/min (A) (by C-G formula based on SCr of 1.3 mg/dL (H)).   Assessment: 86 y/o M on warfarin PTA for afib admitted with CHF exacerbation. PTA warfarin dose - 2.5 mg daily except 5 mg on Mon, Fri, last dose taken 1/30 PTA.  INR 3.1 on admit, trended up to 4.0 on 2/1, now back down to 2.1 (likely due to held doses 1/31 and 2/1). CBC stable. No active bleed issues documented.  Goal of Therapy:  INR 2-3 Monitor platelets by anticoagulation protocol: Yes   Plan:  Continue home dose - warfarin 2.5mg  PO x 1 Monitor daily INR, CBC, s/sx bleeding   Elicia Lamp, PharmD, BCPS Please check AMION for all [facility] contact numbers Clinical Pharmacist 08/06/2019 9:18 AM

## 2019-08-06 NOTE — Progress Notes (Signed)
   Pt is now on hospice Cardiology will sign off   Mertie Moores, MD  08/06/2019 1:41 PM    [address],  [address] [place], [place]  [address] Phone: [phone]; Fax: [phone]

## 2019-08-06 NOTE — Progress Notes (Addendum)
Discharge education and medication education given to patient and daughter with teach back. All questions and concerns answered. Peripheral IV and telemetry leads removed. All patient belongings given to patient and [facility] transporters. Patient transported to home by [facility] transportation.

## 2019-08-06 NOTE — TOC Transition Note (Addendum)
Transition of Care [facility]) - CM/SW Discharge Note   Patient Details  Name: Aaron Frye MRN: [id number] Date of Birth: 09-May-1925  [facility] CM/SW Contact:  Zenon Mayo, RN Phone Number: 08/06/2019, 12:35 PM   Clinical Narrative:    NCM received referral for home hospice with [facility].  NCM confirmed with daughter [name].  NCM made referral to [facility] with Jeani Hawking, NCM gave Jeani Hawking the daughter phone number to call and discuss with her.  Patient has w/chair, walker, lift chair, bsc, shower chair and oxygen concentrator at home.  Daughter wants him to have yougurt or pudding and pinapples before he leaves via ptar transport.  NCM confirmed address. DNR signed.    Final next level of care: Home w Hospice Care Barriers to Discharge: No Barriers Identified   Patient Goals and CMS Choice Patient states their goals for this hospitalization and ongoing recovery are:: home with hospice CMS Medicare.gov Compare Post Acute Care list provided to:: Patient Represenative (must comment)(daughter) Choice offered to / list presented to : Adult Children  Discharge Placement                       Discharge Plan and Services   Discharge Planning Services: CM Consult Post Acute Care Choice: Home Health          DME Arranged: (DME will be arranged by [facility])         [place] Arranged: RN, PT HH Agency: ([place]) Date [place]: 08/06/19 Time [place]: [id number] Representative spoke with at [place]: [place] ([place]) Interventions     Readmission Risk Interventions No flowsheet data found.

## 2019-08-07 LAB — BODY FLUID CULTURE
Culture: NO GROWTH
Culture: NO GROWTH

## 2019-08-07 LAB — CULTURE, BLOOD (ROUTINE X 2)
Culture: NO GROWTH
Culture: NO GROWTH
Special Requests: ADEQUATE

## 2019-08-07 LAB — CHOLESTEROL, BODY FLUID
Cholesterol, Fluid: 19 mg/dL
Cholesterol, Fluid: 19 mg/dL

## 2019-08-10 ENCOUNTER — Encounter (INDEPENDENT_AMBULATORY_CARE_PROVIDER_SITE_OTHER): Payer: Medicare Other | Admitting: Ophthalmology

## 2019-08-31 DEATH — deceased

## 2019-09-25 ENCOUNTER — Ambulatory Visit: Payer: Medicare Other | Admitting: Cardiovascular Disease

## 2019-10-08 ENCOUNTER — Other Ambulatory Visit: Payer: Self-pay | Admitting: Cardiovascular Disease

## 2019-10-16 ENCOUNTER — Other Ambulatory Visit: Payer: Self-pay | Admitting: Cardiovascular Disease
